# Patient Record
Sex: Male | Born: 1945
Health system: Southern US, Community
[De-identification: ages and names within clinical notes are randomized; demographics above are authoritative.]

## PROBLEM LIST (undated history)

## (undated) ENCOUNTER — Emergency Department (HOSPITAL_COMMUNITY): Discharge status: Absent without leave | Payer: Medicare Other

## (undated) DIAGNOSIS — Z7189 Other specified counseling: Secondary | ICD-10-CM

## (undated) DIAGNOSIS — E440 Moderate protein-calorie malnutrition: Secondary | ICD-10-CM

## (undated) DIAGNOSIS — Z7901 Long term (current) use of anticoagulants: Secondary | ICD-10-CM

## (undated) DIAGNOSIS — I509 Heart failure, unspecified: Secondary | ICD-10-CM

## (undated) DIAGNOSIS — I4891 Unspecified atrial fibrillation: Secondary | ICD-10-CM

## (undated) DIAGNOSIS — D638 Anemia in other chronic diseases classified elsewhere: Secondary | ICD-10-CM

## (undated) DIAGNOSIS — J90 Pleural effusion, not elsewhere classified: Secondary | ICD-10-CM

## (undated) DIAGNOSIS — Z8719 Personal history of other diseases of the digestive system: Secondary | ICD-10-CM

## (undated) DIAGNOSIS — K4090 Unilateral inguinal hernia, without obstruction or gangrene, not specified as recurrent: Secondary | ICD-10-CM

## (undated) DIAGNOSIS — C3490 Malignant neoplasm of unspecified part of unspecified bronchus or lung: Secondary | ICD-10-CM

## (undated) DIAGNOSIS — Z87442 Personal history of urinary calculi: Secondary | ICD-10-CM

## (undated) DIAGNOSIS — R918 Other nonspecific abnormal finding of lung field: Secondary | ICD-10-CM

## (undated) DIAGNOSIS — I4892 Unspecified atrial flutter: Secondary | ICD-10-CM

## (undated) DIAGNOSIS — T884XXA Failed or difficult intubation, initial encounter: Secondary | ICD-10-CM

## (undated) DIAGNOSIS — R0602 Shortness of breath: Secondary | ICD-10-CM

## (undated) DIAGNOSIS — K469 Unspecified abdominal hernia without obstruction or gangrene: Secondary | ICD-10-CM

## (undated) DIAGNOSIS — R600 Localized edema: Secondary | ICD-10-CM

## (undated) DIAGNOSIS — L8990 Pressure ulcer of unspecified site, unspecified stage: Secondary | ICD-10-CM

## (undated) HISTORY — DX: Malignant neoplasm of unspecified part of unspecified bronchus or lung: C34.90

## (undated) HISTORY — DX: Failed or difficult intubation, initial encounter: T88.4XXA

## (undated) HISTORY — DX: Moderate protein-calorie malnutrition: E44.0

## (undated) HISTORY — DX: Heart failure, unspecified: I50.9

## (undated) HISTORY — DX: Pressure ulcer of unspecified site, unspecified stage: L89.90

## (undated) HISTORY — DX: Localized edema: R60.0

## (undated) HISTORY — DX: Unspecified atrial flutter: I48.92

## (undated) HISTORY — DX: Other specified counseling: Z71.89

## (undated) HISTORY — DX: Unspecified atrial fibrillation: I48.91

## (undated) HISTORY — DX: Long term (current) use of anticoagulants: Z79.01

## (undated) HISTORY — DX: Shortness of breath: R06.02

## (undated) HISTORY — DX: Personal history of urinary calculi: Z87.442

## (undated) HISTORY — DX: Unilateral inguinal hernia, without obstruction or gangrene, not specified as recurrent: K40.90

## (undated) HISTORY — DX: Anemia in other chronic diseases classified elsewhere: D63.8

## (undated) HISTORY — DX: Pleural effusion, not elsewhere classified: J90

## (undated) HISTORY — DX: Other nonspecific abnormal finding of lung field: R91.8

## (undated) HISTORY — DX: Unspecified abdominal hernia without obstruction or gangrene: K46.9

---

## 1990-08-15 DIAGNOSIS — Z87442 Personal history of urinary calculi: Secondary | ICD-10-CM

## 1990-08-15 HISTORY — DX: Personal history of urinary calculi: Z87.442

## 1999-01-01 ENCOUNTER — Ambulatory Visit (HOSPITAL_COMMUNITY): Admission: RE | Admit: 1999-01-01 | Discharge: 1999-01-01 | Payer: Self-pay | Admitting: *Deleted

## 2005-03-17 ENCOUNTER — Encounter: Admission: RE | Admit: 2005-03-17 | Discharge: 2005-06-15 | Payer: Self-pay | Admitting: Family Medicine

## 2006-06-26 ENCOUNTER — Emergency Department (HOSPITAL_COMMUNITY): Admission: EM | Admit: 2006-06-26 | Discharge: 2006-06-26 | Payer: Self-pay | Admitting: Emergency Medicine

## 2010-08-15 DIAGNOSIS — K469 Unspecified abdominal hernia without obstruction or gangrene: Secondary | ICD-10-CM

## 2010-08-15 HISTORY — DX: Unspecified abdominal hernia without obstruction or gangrene: K46.9

## 2010-08-15 HISTORY — PX: HERNIA REPAIR: SHX51

## 2011-03-14 ENCOUNTER — Encounter (INDEPENDENT_AMBULATORY_CARE_PROVIDER_SITE_OTHER): Payer: Self-pay | Admitting: General Surgery

## 2011-03-14 ENCOUNTER — Ambulatory Visit (INDEPENDENT_AMBULATORY_CARE_PROVIDER_SITE_OTHER): Payer: BC Managed Care – PPO | Admitting: General Surgery

## 2011-03-14 VITALS — BP 140/86 | HR 72 | Temp 97.9°F | Ht 73.0 in | Wt 166.0 lb

## 2011-03-14 DIAGNOSIS — K4090 Unilateral inguinal hernia, without obstruction or gangrene, not specified as recurrent: Secondary | ICD-10-CM

## 2011-03-14 NOTE — Progress Notes (Signed)
Brian Ramirez is a 65 y.o. male.    Chief Complaint  Patient presents with  . Other    Evaluate possible indirect hernia    HPI HPI This patient presents for evaluation of right groin pain and bulge which he noticed approximately one month ago. He states he is very active and works out about 3 times a week lifting weights and notices right groin pain with leg lifts. He also noticed a bulge at that time when he was in the shower. He denies any change in the bulge since then she states that this pain is improved although he does still have discomfort in the right groin crease. He states that since then he has been afraid to work out and it limits his lifestyle due to discomfort and the worry. He does state that it has fluctuated in size since then. He denies any exacerbating or relieving factors. He also denies any associated symptoms or trip to symptoms. His bowels are normal and denies any nausea or vomiting.  Past Medical History  Diagnosis Date  . Personal history of kidney stones 1992  . Hernia 2012    PSH:  Orthopedic surgeries, and jaw surgery Lithotripsy for kidney stones  No family history on file.  Social History History  Substance Use Topics  . Smoking status: Never Smoker   . Smokeless tobacco: Not on file  . Alcohol Use: 0.6 oz/week    1 Glasses of wine per week    No Known Allergies  Current Outpatient Prescriptions  Medication Sig Dispense Refill  . multivitamin (THERAGRAN) per tablet Take 1 tablet by mouth daily.          Review of Systems Review of Systems  Constitutional: Negative.   HENT: Negative.   Eyes: Negative.   Respiratory: Negative.   Cardiovascular: Negative.   Gastrointestinal: Negative.   Genitourinary: Negative.   Musculoskeletal: Negative.   Skin: Negative.   Neurological: Negative.   Endo/Heme/Allergies: Negative.   Psychiatric/Behavioral: Negative.     Physical Exam Physical Exam  Constitutional: He is oriented to person,  place, and time. He appears well-developed and well-nourished. No distress.  HENT:  Head: Normocephalic and atraumatic.  Mouth/Throat: No oropharyngeal exudate.  Eyes: Conjunctivae and EOM are normal. Pupils are equal, round, and reactive to light. Right eye exhibits no discharge. Left eye exhibits no discharge. No scleral icterus.  Neck: Normal range of motion. Neck supple. No tracheal deviation present.  Cardiovascular: Normal rate, regular rhythm and normal heart sounds.   Respiratory: Effort normal and breath sounds normal. No stridor. No respiratory distress. He has no wheezes.  GI: Soft. Bowel sounds are normal. He exhibits no distension and no mass. There is no tenderness. There is no rebound and no guarding.  Genitourinary: Penis normal. No penile tenderness.       Reducible RIH, nontender No LIH  Musculoskeletal: He exhibits no edema and no tenderness.       Right leg limited motion mainly at knee joint   Neurological: He is alert and oriented to person, place, and time. He has normal reflexes.  Skin: Skin is warm. No rash noted. He is not diaphoretic. No erythema. No pallor.  Psychiatric: He has a normal mood and affect. His behavior is normal. Judgment and thought content normal.     Blood pressure 140/86, pulse 72, temperature 97.9 F (36.6 C), temperature source Temporal, height 6\' 1"  (1.854 m), weight 166 lb (75.297 kg).  Assessment/Plan Reducible right inguinal hernia He does have  a moderate-sized right inguinal hernia on exam which is reducible today. I discussed with him the options of watchful waiting versus open repair versus laparoscopic repair. We discussed the risks and benefits of each and he is interested in laparoscopic right inguinal hernia repair with mesh. We discussed the risks of infection, bleeding, pain, scarring, recurrence, injury to the vas deferens, injury to testicle, and chronic pain and need for open surgery and he expressed understanding and desires to  proceed with laparoscopic hernia repair. I will request that he have an anesthesia consultation prior to his procedure given the fact that he has had jaw surgery previously and had a difficult airway per patient history. If for some reason he cannot undergo general anesthesia we will change her plan consider open right inguinal hernia repair with local and moderate sedation.   Lodema Pilot DAVID 03/14/2011, 2:09 PM

## 2011-03-21 ENCOUNTER — Other Ambulatory Visit (INDEPENDENT_AMBULATORY_CARE_PROVIDER_SITE_OTHER): Payer: Self-pay | Admitting: General Surgery

## 2011-03-21 ENCOUNTER — Encounter (HOSPITAL_COMMUNITY): Payer: BC Managed Care – PPO

## 2011-03-21 LAB — CBC
Hemoglobin: 14.6 g/dL (ref 13.0–17.0)
RBC: 4.83 MIL/uL (ref 4.22–5.81)
WBC: 6.6 10*3/uL (ref 4.0–10.5)

## 2011-03-31 ENCOUNTER — Ambulatory Visit (HOSPITAL_COMMUNITY)
Admission: RE | Admit: 2011-03-31 | Discharge: 2011-03-31 | Disposition: A | Discharge status: Home / Self Care | Payer: BC Managed Care – PPO | Source: Ambulatory Visit | Attending: General Surgery | Admitting: General Surgery

## 2011-03-31 DIAGNOSIS — K4090 Unilateral inguinal hernia, without obstruction or gangrene, not specified as recurrent: Secondary | ICD-10-CM | POA: Insufficient documentation

## 2011-03-31 DIAGNOSIS — Z01812 Encounter for preprocedural laboratory examination: Secondary | ICD-10-CM | POA: Insufficient documentation

## 2011-03-31 DIAGNOSIS — Z87442 Personal history of urinary calculi: Secondary | ICD-10-CM | POA: Insufficient documentation

## 2011-03-31 DIAGNOSIS — Z7982 Long term (current) use of aspirin: Secondary | ICD-10-CM | POA: Insufficient documentation

## 2011-04-05 NOTE — Op Note (Signed)
NAME:  Brian Ramirez, DURKEE NO.:  [id number]  MEDICAL RECORD NO.:  [id number]  LOCATION:  [facility]                         FACILITY:  [facility]  PHYSICIAN:  Lodema Pilot, MD       DATE OF BIRTH:  1945/11/28  DATE OF PROCEDURE:  03/31/2011 DATE OF DISCHARGE:  03/21/2011                              OPERATIVE REPORT   PROCEDURE:  Open right inguinal hernia repair with mesh.  PREOPERATIVE DIAGNOSIS:  Right inguinal hernia.  POSTOPERATIVE DIAGNOSIS:  Right inguinal hernia.  SURGEON:  Lodema Pilot, MD  ASSISTANT:  None.  ANESTHESIA:  Spinal anesthesia with 30 mL of 1% lidocaine with epinephrine and 0.25% Marcaine injected at 50:50 mixture.  FLUIDS:  1100 mL of crystalloid.  ESTIMATED BLOOD LOSS:  Minimal.  DRAINS:  None.  SPECIMENS:  None.  COMPLICATIONS:  None apparent.  FINDINGS:  Indirect and direct hernia identified as well as cord lipoma. A 3 inch x 6 inch Ultrapro mesh was placed in the wound and sutured with permanent suture.  Ilioinguinal nerve was identified and preserved.  INDICATIONS FOR PROCEDURE:  Mr. Leifheit is a 65 year old male with a symptomatic right inguinal hernia, desires repair.  OPERATIVE DETAILS:  Mr. Buchler was seen and evaluated in the preoperative area, and risks and benefits of procedure were discussed in lay terms.  Informed consent was obtained.  Prophylactic antibiotics were given and he was taken to the surgical service marked prior to anesthetic administration.  Originally, we had planned for laparoscopic repair; however, due to his difficult airway and consultation with the patient and with Anesthesia, it was felt that changing to an open repair would be without the general anesthesia would be safer for him and he agreed.  His abdomen and groin were prepped and draped in a standard surgical fashion and an oblique incision made in the skin and dissection carried down through subcutaneous tissue using Bovie electrocautery. The  external oblique fascia was divided sharply, incised along the length of the incision opening the external ring.  The ilioinguinal nerve was identified in its usual position anterior to the cord.  This was preserved during the dissection.  The cord was dissected out circumferentially and a Penrose drain was passed for gentle retraction. The spermatic cord was skeletonized and he was noted to have a moderate sized cord lipoma and entire ligation of the cord lipoma was performed with a 2-0 Vicryl stick tie.  He also was noted to have a small direct hernia and the inguinal floor was imbricated with 2-0 Vicryl suture and he was also noted to have an indirect inguinal hernia.  The hernia sac was separated from the spermatic cord and sac was opened and there was no bowel contents identified.  High ligation of sac was performed with 2- 0 Vicryl stick tie and the wound was inspected for hemostasis.  This was noted to be adequate.  A 3 inch x 6 inch Ultrapro mesh was tailored to fit the inguinal canal and sutured into place at the pubic tubercle with 2-0 Prolene sutures.  This was run along the shelving edge of the ligament.  Interrupted 2-0 Prolene sutures were used to secure the mesh medially and cephalad and  laterally.  A slit was placed in the mesh laterally and tails of the mesh were passed around the spermatic cord. The ilioinguinal nerve was wrapped in the mesh with replacing it in its usual position with the cord, the tails of the mesh were tacked laterally with interrupted 2-0 Prolene sutures and the mesh appeared to lie flat covering the indirect and direct defects.  The wound was inspected for hemostasis which was noted to be adequate.  The external oblique fascia was approximated with a running 2-0 Vicryl suture, recreating a new external ring and Scarpa fascia was approximated with running 3-0 Vicryl suture.  A 4-0 Monocryl suture was used to approximate the skin edges and the skin  edges were well approximated and Dermabond was applied.  All sponge, needle, and instrument counts were correct at the end of the case.  The patient tolerated the procedure well.  There were no apparent complications.          ______________________________ Lodema Pilot, MD     BL/MEDQ  D:  03/31/2011  T:  04/01/2011  Job:  [id number]  Electronically Signed by Lodema Pilot DO on 04/05/2011 09:44:15 AM

## 2011-04-19 ENCOUNTER — Encounter (INDEPENDENT_AMBULATORY_CARE_PROVIDER_SITE_OTHER): Payer: Self-pay | Admitting: General Surgery

## 2011-04-19 ENCOUNTER — Ambulatory Visit (INDEPENDENT_AMBULATORY_CARE_PROVIDER_SITE_OTHER): Payer: BC Managed Care – PPO | Admitting: General Surgery

## 2011-04-19 VITALS — BP 138/84 | HR 70 | Temp 97.3°F | Resp 16

## 2011-04-19 DIAGNOSIS — Z4889 Encounter for other specified surgical aftercare: Secondary | ICD-10-CM

## 2011-04-19 DIAGNOSIS — Z5189 Encounter for other specified aftercare: Secondary | ICD-10-CM

## 2011-04-19 NOTE — Progress Notes (Signed)
Subjective:     Patient ID: Brian Ramirez, male   DOB: 1945-10-25, 65 y.o.   MRN: [id number]  HPI This patient is almost 3 weeks status post open right inguinal hernia repair with mesh. He is doing well without complaints. He actually has returned to his leg lifts and his regular leg workouts. He is not taking pain medication and his bowels are functioning regularly. He is asking when he can go back to full activity.He did have some urinary retention after his spinal anesthesia which required Foley placement and he removed this himself on postop day #1. He has not had any problems voiding since then.  Review of Systems     Objective:   Physical Exam No acute distress and nontoxic-appearing  His incision is healing well without sign of infection. He has a healing ridge but no evidence of recurrent hernia. No bulge with Valsalva    Assessment:     S/p open RIH-doing well    Plan:     He seems to be doing well and appropriate for this stage in his recovery. The wound looks good and there is no evidence of recurrent hernia. I recommended that he continue with light duty for another week to 1-1/2 weeks and at that time he can return to full activity as tolerated.

## 2012-07-09 DIAGNOSIS — Z23 Encounter for immunization: Secondary | ICD-10-CM | POA: Diagnosis not present

## 2012-07-09 DIAGNOSIS — N529 Male erectile dysfunction, unspecified: Secondary | ICD-10-CM | POA: Diagnosis not present

## 2012-07-09 DIAGNOSIS — Z125 Encounter for screening for malignant neoplasm of prostate: Secondary | ICD-10-CM | POA: Diagnosis not present

## 2012-07-09 DIAGNOSIS — Z0000 Encounter for general adult medical examination without abnormal findings: Secondary | ICD-10-CM | POA: Diagnosis not present

## 2012-07-09 DIAGNOSIS — Z136 Encounter for screening for cardiovascular disorders: Secondary | ICD-10-CM | POA: Diagnosis not present

## 2012-07-09 DIAGNOSIS — Z79899 Other long term (current) drug therapy: Secondary | ICD-10-CM | POA: Diagnosis not present

## 2012-07-11 ENCOUNTER — Other Ambulatory Visit: Payer: Self-pay | Admitting: Family Medicine

## 2012-07-11 DIAGNOSIS — Z139 Encounter for screening, unspecified: Secondary | ICD-10-CM

## 2012-07-16 ENCOUNTER — Ambulatory Visit
Admission: RE | Admit: 2012-07-16 | Discharge: 2012-07-16 | Disposition: A | Discharge status: Home / Self Care | Payer: Medicare Other | Source: Ambulatory Visit | Attending: Family Medicine | Admitting: Family Medicine

## 2012-07-16 DIAGNOSIS — Z139 Encounter for screening, unspecified: Secondary | ICD-10-CM

## 2012-07-16 DIAGNOSIS — Z136 Encounter for screening for cardiovascular disorders: Secondary | ICD-10-CM | POA: Diagnosis not present

## 2012-08-24 DIAGNOSIS — J069 Acute upper respiratory infection, unspecified: Secondary | ICD-10-CM | POA: Diagnosis not present

## 2013-03-05 DIAGNOSIS — R040 Epistaxis: Secondary | ICD-10-CM | POA: Diagnosis not present

## 2013-03-05 DIAGNOSIS — H6980 Other specified disorders of Eustachian tube, unspecified ear: Secondary | ICD-10-CM | POA: Diagnosis not present

## 2013-03-25 DIAGNOSIS — M21549 Acquired clubfoot, unspecified foot: Secondary | ICD-10-CM | POA: Diagnosis not present

## 2013-07-23 DIAGNOSIS — Z0000 Encounter for general adult medical examination without abnormal findings: Secondary | ICD-10-CM | POA: Diagnosis not present

## 2013-07-23 DIAGNOSIS — Z5181 Encounter for therapeutic drug level monitoring: Secondary | ICD-10-CM | POA: Diagnosis not present

## 2013-07-23 DIAGNOSIS — Z125 Encounter for screening for malignant neoplasm of prostate: Secondary | ICD-10-CM | POA: Diagnosis not present

## 2013-07-23 DIAGNOSIS — Z136 Encounter for screening for cardiovascular disorders: Secondary | ICD-10-CM | POA: Diagnosis not present

## 2013-07-23 DIAGNOSIS — N529 Male erectile dysfunction, unspecified: Secondary | ICD-10-CM | POA: Diagnosis not present

## 2014-01-24 DIAGNOSIS — R7301 Impaired fasting glucose: Secondary | ICD-10-CM | POA: Diagnosis not present

## 2014-04-08 DIAGNOSIS — H2510 Age-related nuclear cataract, unspecified eye: Secondary | ICD-10-CM | POA: Diagnosis not present

## 2014-04-08 DIAGNOSIS — H1045 Other chronic allergic conjunctivitis: Secondary | ICD-10-CM | POA: Diagnosis not present

## 2015-03-03 DIAGNOSIS — Z136 Encounter for screening for cardiovascular disorders: Secondary | ICD-10-CM | POA: Diagnosis not present

## 2015-03-03 DIAGNOSIS — R7301 Impaired fasting glucose: Secondary | ICD-10-CM | POA: Diagnosis not present

## 2015-03-03 DIAGNOSIS — Z0001 Encounter for general adult medical examination with abnormal findings: Secondary | ICD-10-CM | POA: Diagnosis not present

## 2015-03-03 DIAGNOSIS — N5201 Erectile dysfunction due to arterial insufficiency: Secondary | ICD-10-CM | POA: Diagnosis not present

## 2015-03-03 DIAGNOSIS — Z125 Encounter for screening for malignant neoplasm of prostate: Secondary | ICD-10-CM | POA: Diagnosis not present

## 2015-03-03 DIAGNOSIS — Z131 Encounter for screening for diabetes mellitus: Secondary | ICD-10-CM | POA: Diagnosis not present

## 2015-03-03 DIAGNOSIS — Z23 Encounter for immunization: Secondary | ICD-10-CM | POA: Diagnosis not present

## 2015-03-23 DIAGNOSIS — Z0001 Encounter for general adult medical examination with abnormal findings: Secondary | ICD-10-CM | POA: Diagnosis not present

## 2015-03-23 DIAGNOSIS — Z1211 Encounter for screening for malignant neoplasm of colon: Secondary | ICD-10-CM | POA: Diagnosis not present

## 2015-04-09 DIAGNOSIS — H2513 Age-related nuclear cataract, bilateral: Secondary | ICD-10-CM | POA: Diagnosis not present

## 2016-04-28 DIAGNOSIS — L700 Acne vulgaris: Secondary | ICD-10-CM | POA: Diagnosis not present

## 2016-04-28 DIAGNOSIS — L219 Seborrheic dermatitis, unspecified: Secondary | ICD-10-CM | POA: Diagnosis not present

## 2016-08-29 DIAGNOSIS — R7309 Other abnormal glucose: Secondary | ICD-10-CM | POA: Diagnosis not present

## 2016-08-29 DIAGNOSIS — K4090 Unilateral inguinal hernia, without obstruction or gangrene, not specified as recurrent: Secondary | ICD-10-CM | POA: Diagnosis not present

## 2016-08-29 DIAGNOSIS — Z79899 Other long term (current) drug therapy: Secondary | ICD-10-CM | POA: Diagnosis not present

## 2016-08-29 DIAGNOSIS — E7800 Pure hypercholesterolemia, unspecified: Secondary | ICD-10-CM | POA: Diagnosis not present

## 2016-08-29 DIAGNOSIS — Z125 Encounter for screening for malignant neoplasm of prostate: Secondary | ICD-10-CM | POA: Diagnosis not present

## 2016-08-29 DIAGNOSIS — N5201 Erectile dysfunction due to arterial insufficiency: Secondary | ICD-10-CM | POA: Diagnosis not present

## 2016-08-29 DIAGNOSIS — R7301 Impaired fasting glucose: Secondary | ICD-10-CM | POA: Diagnosis not present

## 2016-08-29 DIAGNOSIS — Z0001 Encounter for general adult medical examination with abnormal findings: Secondary | ICD-10-CM | POA: Diagnosis not present

## 2016-09-07 DIAGNOSIS — Z1211 Encounter for screening for malignant neoplasm of colon: Secondary | ICD-10-CM | POA: Diagnosis not present

## 2016-09-26 ENCOUNTER — Ambulatory Visit: Payer: Self-pay | Admitting: Surgery

## 2016-09-26 ENCOUNTER — Encounter: Payer: Self-pay | Admitting: Surgery

## 2016-09-26 DIAGNOSIS — T884XXD Failed or difficult intubation, subsequent encounter: Secondary | ICD-10-CM | POA: Diagnosis not present

## 2016-09-26 DIAGNOSIS — Z01818 Encounter for other preprocedural examination: Secondary | ICD-10-CM | POA: Diagnosis not present

## 2016-09-26 DIAGNOSIS — T884XXA Failed or difficult intubation, initial encounter: Secondary | ICD-10-CM | POA: Insufficient documentation

## 2016-09-26 DIAGNOSIS — K4090 Unilateral inguinal hernia, without obstruction or gangrene, not specified as recurrent: Secondary | ICD-10-CM

## 2016-09-26 HISTORY — DX: Unilateral inguinal hernia, without obstruction or gangrene, not specified as recurrent: K40.90

## 2016-09-26 HISTORY — DX: Failed or difficult intubation, initial encounter: T88.4XXA

## 2016-09-26 NOTE — H&P (Signed)
Brian Ramirez 09/26/2016 1:33 PM Location: [facility] Patient #: [id number] DOB: Aug 24, 1945 Single / Language: Brian Ramirez / Race: White Male  History of Present Illness Brian Hector MD; 09/26/2016 2:29 PM) The patient is a 71 year old male who presents with an inguinal hernia. Note for "Inguinal hernia": Patient sent for surgical consultation at the request of his primary care physician, Dr. Lanette Ramirez. Concern for left inguinal hernia.  Pleasant active male. Had open right inguinal hernia repair with mesh in 2012 by former partner Dr. Lilyan Ramirez whom relocated back into the [employer]. He is noted some intermittent bulging on the left side suspicious for a left inguinal hernia. Nothing obvious felt on annual wellness visit but history suspicious. Surgical consultation requested.  Patient notes he gets occasional bulging and discomfort and he is exercising like doing sit ups. He had an open right inguinal hernia repair done under spinal anesthesia. He request recalls being told by anesthesia that his intubation risks were high due to a tight oropharynx and difficult airway. Therefore did under a spinal block. He did have urinary retention and wonders if he can avoid that. He prefer [employer] He has no problems with urination or defecation. Section active. He does say his girlfriend thought that sex was different after the inguinal hernia repair. Less volume of ejaculate? Patient did not have any wound infections. No fevers or chills. Moves his bowels about 4 times a day. Normal colonoscopy in the past. Does not smoke. No cardiac or pulmonary issues. Minimal mobility due to a fixed right knee. On disability somewhat. Does try to exercise with sit ups and a gym routine.   Past Surgical History (Brian Ramirez, CMA; 09/26/2016 1:33 PM) Open Inguinal Hernia Surgery Right. Oral Surgery  Diagnostic Studies History (Brian Ramirez, [place]; 09/26/2016 1:33  PM) Colonoscopy >10 years ago  Allergies (Brian Ramirez, CMA; 09/26/2016 1:33 PM) No Known Drug Allergies 09/26/2016  Medication History (Brian Ramirez, [place]; 09/26/2016 1:33 PM) No Current Medications Medications Reconciled  Social History (Brian Ramirez, CMA; 09/26/2016 1:33 PM) Alcohol use Moderate alcohol use. Caffeine use Coffee. No drug use Tobacco use Former smoker.  Family History (Brian Ramirez, [place]; 09/26/2016 1:33 PM) Colon Cancer Father. Heart Disease Father.  Other Problems (Brian Ramirez, CMA; 09/26/2016 1:33 PM) Inguinal Hernia     Review of Systems (Brian Ramirez CMA; 09/26/2016 1:33 PM) General Not Present- Appetite Loss, Chills, Fatigue, Fever, Night Sweats, Weight Gain and Weight Loss. Skin Not Present- Change in Wart/Mole, Dryness, Hives, Jaundice, New Lesions, Non-Healing Wounds, Rash and Ulcer. HEENT Not Present- Earache, Hearing Loss, Hoarseness, Nose Bleed, Oral Ulcers, Ringing in the Ears, Seasonal Allergies, Sinus Pain, Sore Throat, Visual Disturbances, Wears glasses/contact lenses and Yellow Eyes. Respiratory Not Present- Bloody sputum, Chronic Cough, Difficulty Breathing, Snoring and Wheezing. Breast Not Present- Breast Mass, Breast Pain, Nipple Discharge and Skin Changes. Cardiovascular Not Present- Chest Pain, Difficulty Breathing Lying Down, Leg Cramps, Palpitations, Rapid Heart Rate, Shortness of Breath and Swelling of Extremities. Gastrointestinal Not Present- Abdominal Pain, Bloating, Bloody Stool, Change in Bowel Habits, Chronic diarrhea, Constipation, Difficulty Swallowing, Excessive gas, Gets full quickly at meals, Hemorrhoids, Indigestion, Nausea, Rectal Pain and Vomiting. Male Genitourinary Not Present- Blood in Urine, Change in Urinary Stream, Frequency, Impotence, Nocturia, Painful Urination, Urgency and Urine Leakage.  Vitals (Brian Ramirez CMA; 09/26/2016 1:34 PM) 09/26/2016 1:33 PM Weight: 173.5 lb Height: 73in Body Surface Area: 2.03  m Body Mass Index: 22.89 kg/m  Temp.: 98.27F(Oral)  Pulse: 70 (Regular)  BP: 140/78 (Sitting, Left Arm, Standard)      Physical Exam Brian Hector MD; 09/26/2016 2:18 PM)  Head and Neck Note: Neck somewhat stiff with limited motion.  Eye Note: Wears glasses. Vision corrected  ENMT Note: Can open-mouth about 2-1/2 cm wide. Motion. No hoarseness. No stridor.  Male Genitourinary Note: Left groin bulging with Valsalva consistent with inguinal hernia. He feels more lateral. Perhaps an indirect.  No right inguinal hernia. Normal external genitalia. Epididymi, testes, and spermatic cords normal without any masses.  Musculoskeletal Note: Fixed right knee with inward valgus. Walks with a limp    Assessment & Plan Brian Hector MD; 09/26/2016 2:16 PM)  LEFT INGUINAL HERNIA (K40.90) Impression: Thank you benefit from hernia repair. Standard of care is with mesh.  Usually I do these laparoscopic underlay TEP approach. However with his difficult airway, more likely may have to do a more open with regional anesthesia & MAC. We'll have anesthesia meet with the patient preoperatively and see what is the safest plan. Would prefer to have this done at a hospital setting in case of airway issues that requires obeservation/admission. Patient prefers [place] EXAMINATION FOR GENERAL SURGICAL PROCEDURE (Z01.818)  Current Plans You are being scheduled for surgery- Our schedulers will call you.  You should hear from our office's scheduling department within 5 working days about the location, date, and time of surgery. We try to make accommodations for patient's preferences in scheduling surgery, but sometimes the OR schedule or the surgeon's schedule prevents [place] from making those accommodations.  If you have not heard from our office [phone]) in 5 working days, call the office and ask for your surgeon's  nurse.  If you have other questions about your diagnosis, plan, or surgery, call the office and ask for your surgeon's nurse.  Written instructions provided The anatomy & physiology of the abdominal wall and pelvic floor was discussed. The pathophysiology of hernias in the inguinal and pelvic region was discussed. Natural history risks such as progressive enlargement, pain, incarceration, and strangulation was discussed. Contributors to complications such as smoking, obesity, diabetes, prior surgery, etc were discussed.  I feel the risks of no intervention will lead to serious problems that outweigh the operative risks; therefore, I recommended surgery to reduce and repair the hernia. I explained laparoscopic techniques with possible need for an open approach. I noted usual use of mesh to patch and/or buttress hernia repair  Risks such as bleeding, infection, abscess, need for further treatment, heart attack, death, and other risks were discussed. I noted a good likelihood this will help address the problem. Goals of post-operative recovery were discussed as well. Possibility that this will not correct all symptoms was explained. I stressed the importance of low-impact activity, aggressive pain control, avoiding constipation, & not pushing through pain to minimize risk of post-operative chronic pain or injury. Possibility of reherniation was discussed. We will work to minimize complications.  An educational handout further explaining the pathology & treatment options was given as well. Questions were answered. The patient expresses understanding & wishes to proceed with surgery.  Pt Education - [facility] Pain Control (Locke Barrell) Pt Education - [facility] Hernia Post-Op HCI (Mylasia Vorhees): discussed with patient and provided information. Pt Education - Pamphlet Given - Hernia Surgery: discussed with patient and provided information. DIFFICULT AIRWAY FOR INTUBATION, SUBSEQUENT ENCOUNTER  (T88.4XXD) Impression: Patient with difficult airway for intubation. Dr. Landry Dyke with anesthesia apparently did not feel comfortable attempting  endotracheal intubation in 2012. Last time he had to have spinal anesthesia and open inguinal hernia repair.  We'll have anesthesia meet with him at the preoperative visit and have a plan for his airway. Should they feel it is safe to intubate him, would do a laparoscopic repair as I think that is a less painful more durable repair. However, if there is doubt, then plan regional block with MAC & open repair w mesh. Patient notes he had urinary retention and difficulty with the spinal anesthesia. Perhaps a TAPP block could be done.  Brian Ramirez, M.D., F.A.C.S. Gastrointestinal and Minimally Invasive Surgery [facility] [address] [place], [place] [id number] [phone] Main / Paging

## 2016-10-25 ENCOUNTER — Encounter (HOSPITAL_COMMUNITY): Payer: Self-pay | Admitting: *Deleted

## 2016-11-16 NOTE — Patient Instructions (Signed)
KOTA CIANCIO  11/16/2016   Your procedure is scheduled on: 11/22/2016    Report to [facility] Main  Entrance take [place]  elevators to 3rd floor to  [place] at   [place] AM.  Call this number if you have problems the morning of surgery [phone]   Remember: ONLY 1 PERSON MAY GO WITH YOU TO SHORT STAY TO GET  READY MORNING OF [place].  Do not eat food or drink liquids :After Midnight.     Take these medicines the morning of surgery with A SIP OF WATER: [place] nasal spray if needed                                 You may not have any metal on your body including hair pins and              piercings  Do not wear jewelry, , lotions, powders or perfumes, deodorant                       Men may shave face and neck.   Do not bring valuables to the hospital. [place].  Contacts, dentures or bridgework may not be worn into surgery.      Patients discharged the day of surgery will not be allowed to drive home.  Name and phone number of your driver:  Special Instructions: N/A              Please read over the following fact sheets you were given: _____________________________________________________________________             [facility] - Preparing for Surgery Before surgery, you can play an important role.  Because skin is not sterile, your skin needs to be as free of germs as possible.  You can reduce the number of germs on your skin by washing with CHG (chlorahexidine gluconate) soap before surgery.  CHG is an antiseptic cleaner which kills germs and bonds with the skin to continue killing germs even after washing. Please DO NOT use if you have an allergy to CHG or antibacterial soaps.  If your skin becomes reddened/irritated stop using the CHG and inform your nurse when you arrive at Short Stay. Do not shave (including legs and underarms) for at least 48 hours prior to the first CHG shower.   You may shave your face/neck. Please follow these instructions carefully:  1.  Shower with CHG Soap the night before surgery and the  morning of Surgery.  2.  If you choose to wash your hair, wash your hair first as usual with your  normal  shampoo.  3.  After you shampoo, rinse your hair and body thoroughly to remove the  shampoo.                           4.  Use CHG as you would any other liquid soap.  You can apply chg directly  to the skin and wash                       Gently with a scrungie or clean washcloth.  5.  Apply the  CHG Soap to your body ONLY FROM THE NECK DOWN.   Do not use on face/ open                           Wound or open sores. Avoid contact with eyes, ears mouth and genitals (private parts).                       Wash face,  Genitals (private parts) with your normal soap.             6.  Wash thoroughly, paying special attention to the area where your surgery  will be performed.  7.  Thoroughly rinse your body with warm water from the neck down.  8.  DO NOT shower/wash with your normal soap after using and rinsing off  the CHG Soap.                9.  Pat yourself dry with a clean towel.            10.  Wear clean pajamas.            11.  Place clean sheets on your bed the night of your first shower and do not  sleep with pets. Day of Surgery : Do not apply any lotions/deodorants the morning of surgery.  Please wear clean clothes to the hospital/surgery center.  FAILURE TO FOLLOW THESE INSTRUCTIONS MAY RESULT IN THE CANCELLATION OF YOUR SURGERY PATIENT SIGNATURE_________________________________  NURSE SIGNATURE__________________________________  ________________________________________________________________________

## 2016-11-17 ENCOUNTER — Encounter (HOSPITAL_COMMUNITY): Payer: Self-pay

## 2016-11-17 ENCOUNTER — Encounter (HOSPITAL_COMMUNITY)
Admission: RE | Admit: 2016-11-17 | Discharge: 2016-11-17 | Disposition: A | Discharge status: Home / Self Care | Payer: Medicare Other | Source: Ambulatory Visit | Attending: Surgery | Admitting: Surgery

## 2016-11-17 DIAGNOSIS — Z01812 Encounter for preprocedural laboratory examination: Secondary | ICD-10-CM | POA: Insufficient documentation

## 2016-11-17 DIAGNOSIS — K4090 Unilateral inguinal hernia, without obstruction or gangrene, not specified as recurrent: Secondary | ICD-10-CM | POA: Insufficient documentation

## 2016-11-17 HISTORY — DX: Personal history of urinary calculi: Z87.442

## 2016-11-17 HISTORY — DX: Personal history of other diseases of the digestive system: Z87.19

## 2016-11-17 LAB — CBC
HCT: 41.7 % (ref 39.0–52.0)
HEMOGLOBIN: 14.6 g/dL (ref 13.0–17.0)
MCH: 31.3 pg (ref 26.0–34.0)
MCHC: 35 g/dL (ref 30.0–36.0)
MCV: 89.5 fL (ref 78.0–100.0)
Platelets: 194 10*3/uL (ref 150–400)
RBC: 4.66 MIL/uL (ref 4.22–5.81)
RDW: 12.6 % (ref 11.5–15.5)
WBC: 5.4 10*3/uL (ref 4.0–10.5)

## 2016-11-17 NOTE — Progress Notes (Signed)
Pt requesting to speak with anesthesiology relating to his anesthesia preference and concerns. Dr. Annye Asa spoke with pt.
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
## 2016-11-21 NOTE — Anesthesia Preprocedure Evaluation (Addendum)
Anesthesia Evaluation  Patient identified by MRN, date of birth, ID band Patient awake    Reviewed: Allergy & Precautions, H&P , Patient's Chart, lab work & pertinent test results, reviewed documented beta blocker date and time   Airway Mallampati: II  TM Distance: >3 FB Neck ROM: full    Dental no notable dental hx.    Pulmonary    Pulmonary exam normal breath sounds clear to auscultation       Cardiovascular Exercise Tolerance: Good  Rhythm:regular Rate:Normal     Neuro/Psych    GI/Hepatic   Endo/Other    Renal/GU      Musculoskeletal   Abdominal   Peds  Hematology   Anesthesia Other Findings   Reproductive/Obstetrics                             Anesthesia Physical Anesthesia Plan  ASA: II  Anesthesia Plan: Spinal, Combined Spinal and Epidural and Epidural   Post-op Pain Management:    Induction:   Airway Management Planned:   Additional Equipment:   Intra-op Plan:   Post-operative Plan:   Informed Consent: I have reviewed the patients History and Physical, chart, labs and discussed the procedure including the risks, benefits and alternatives for the proposed anesthesia with the patient or authorized representative who has indicated his/her understanding and acceptance.   Dental Advisory Given  Plan Discussed with: CRNA and Surgeon  Anesthesia Plan Comments: (  )        Anesthesia Quick Evaluation

## 2016-11-22 ENCOUNTER — Ambulatory Visit (HOSPITAL_COMMUNITY): Payer: Medicare Other | Admitting: Anesthesiology

## 2016-11-22 ENCOUNTER — Ambulatory Visit (HOSPITAL_COMMUNITY)
Admission: RE | Admit: 2016-11-22 | Discharge: 2016-11-22 | Disposition: A | Discharge status: Home / Self Care | Payer: Medicare Other | Source: Ambulatory Visit | Attending: Surgery | Admitting: Surgery

## 2016-11-22 ENCOUNTER — Encounter (HOSPITAL_COMMUNITY)
Admission: RE | Disposition: A | Discharge status: Home / Self Care | Payer: Self-pay | Source: Ambulatory Visit | Attending: Surgery

## 2016-11-22 ENCOUNTER — Encounter (HOSPITAL_COMMUNITY): Payer: Self-pay | Admitting: *Deleted

## 2016-11-22 DIAGNOSIS — K4090 Unilateral inguinal hernia, without obstruction or gangrene, not specified as recurrent: Secondary | ICD-10-CM

## 2016-11-22 DIAGNOSIS — Z87891 Personal history of nicotine dependence: Secondary | ICD-10-CM | POA: Insufficient documentation

## 2016-11-22 HISTORY — PX: INGUINAL HERNIA REPAIR: SHX194

## 2016-11-22 SURGERY — REPAIR, HERNIA, INGUINAL, ADULT
Anesthesia: General | Site: Abdomen | Laterality: Left

## 2016-11-22 MED ORDER — ACETAMINOPHEN 500 MG PO TABS
1000.0000 mg | ORAL_TABLET | ORAL | Status: AC
  Administered 2016-11-22: 1000 mg via ORAL
  Filled 2016-11-22: qty 2

## 2016-11-22 MED ORDER — FENTANYL CITRATE (PF) 100 MCG/2ML IJ SOLN
INTRAMUSCULAR | Status: DC
Start: 2016-11-22 — End: 2016-11-22
  Filled 2016-11-22: qty 2

## 2016-11-22 MED ORDER — ROCURONIUM BROMIDE 50 MG/5ML IV SOSY
PREFILLED_SYRINGE | INTRAVENOUS | Status: AC
  Filled 2016-11-22: qty 5

## 2016-11-22 MED ORDER — FENTANYL CITRATE (PF) 100 MCG/2ML IJ SOLN
INTRAMUSCULAR | Status: DC | PRN
  Administered 2016-11-22 (×3): 50 ug via INTRAVENOUS

## 2016-11-22 MED ORDER — LACTATED RINGERS IV SOLN
INTRAVENOUS | Status: DC | PRN
  Administered 2016-11-22 (×2): via INTRAVENOUS

## 2016-11-22 MED ORDER — SUGAMMADEX SODIUM 200 MG/2ML IV SOLN
INTRAVENOUS | Status: AC
  Filled 2016-11-22: qty 2

## 2016-11-22 MED ORDER — GLYCOPYRROLATE 0.2 MG/ML IV SOSY
PREFILLED_SYRINGE | INTRAVENOUS | Status: AC
  Filled 2016-11-22: qty 10

## 2016-11-22 MED ORDER — BUPIVACAINE HCL (PF) 0.25 % IJ SOLN
INTRAMUSCULAR | Status: AC
  Filled 2016-11-22: qty 60

## 2016-11-22 MED ORDER — FENTANYL CITRATE (PF) 100 MCG/2ML IJ SOLN: 25.0000 ug | INTRAMUSCULAR | Status: DC | PRN

## 2016-11-22 MED ORDER — GABAPENTIN 300 MG PO CAPS
300.0000 mg | ORAL_CAPSULE | ORAL | Status: AC
Start: 2016-11-22 — End: 2016-11-22
  Administered 2016-11-22: 300 mg via ORAL
  Filled 2016-11-22: qty 1

## 2016-11-22 MED ORDER — ACETAMINOPHEN 325 MG PO TABS: 650.0000 mg | ORAL_TABLET | ORAL | Status: DC | PRN

## 2016-11-22 MED ORDER — FENTANYL CITRATE (PF) 100 MCG/2ML IJ SOLN
INTRAMUSCULAR | Status: AC
  Filled 2016-11-22: qty 2

## 2016-11-22 MED ORDER — TRAMADOL HCL 50 MG PO TABS
50.0000 mg | ORAL_TABLET | Freq: Four times a day (QID) | ORAL | Status: DC | PRN
  Administered 2016-11-22: 100 mg via ORAL
  Filled 2016-11-22 (×2): qty 2

## 2016-11-22 MED ORDER — SODIUM CHLORIDE 0.9 % IV SOLN: 250.0000 mL | INTRAVENOUS | Status: DC | PRN

## 2016-11-22 MED ORDER — ONDANSETRON HCL 4 MG/2ML IJ SOLN
INTRAMUSCULAR | Status: AC
  Filled 2016-11-22: qty 2

## 2016-11-22 MED ORDER — MIDAZOLAM HCL 5 MG/5ML IJ SOLN
INTRAMUSCULAR | Status: DC | PRN
  Administered 2016-11-22: 2 mg via INTRAVENOUS

## 2016-11-22 MED ORDER — CELECOXIB 200 MG PO CAPS
400.0000 mg | ORAL_CAPSULE | ORAL | Status: AC
  Administered 2016-11-22: 400 mg via ORAL
  Filled 2016-11-22: qty 2

## 2016-11-22 MED ORDER — LACTATED RINGERS IR SOLN
Status: DC | PRN
  Administered 2016-11-22: 1000 mL

## 2016-11-22 MED ORDER — ROCURONIUM BROMIDE 100 MG/10ML IV SOLN
INTRAVENOUS | Status: DC | PRN
  Administered 2016-11-22: 10 mg via INTRAVENOUS
  Administered 2016-11-22: 50 mg via INTRAVENOUS
  Administered 2016-11-22: 20 mg via INTRAVENOUS

## 2016-11-22 MED ORDER — SUGAMMADEX SODIUM 200 MG/2ML IV SOLN
INTRAVENOUS | Status: DC | PRN
  Administered 2016-11-22: 200 mg via INTRAVENOUS

## 2016-11-22 MED ORDER — BUPIVACAINE HCL (PF) 0.25 % IJ SOLN
INTRAMUSCULAR | Status: DC | PRN
  Administered 2016-11-22: 50 mL

## 2016-11-22 MED ORDER — GLYCOPYRROLATE 0.2 MG/ML IJ SOLN
INTRAMUSCULAR | Status: DC | PRN
  Administered 2016-11-22: 0.6 mg via INTRAVENOUS

## 2016-11-22 MED ORDER — CEFAZOLIN SODIUM-DEXTROSE 2-4 GM/100ML-% IV SOLN
INTRAVENOUS | Status: AC
  Filled 2016-11-22: qty 100

## 2016-11-22 MED ORDER — ONDANSETRON HCL 4 MG/2ML IJ SOLN
INTRAMUSCULAR | Status: DC | PRN
  Administered 2016-11-22: 4 mg via INTRAVENOUS

## 2016-11-22 MED ORDER — DEXAMETHASONE SODIUM PHOSPHATE 10 MG/ML IJ SOLN
INTRAMUSCULAR | Status: DC | PRN
  Administered 2016-11-22: 10 mg via INTRAVENOUS

## 2016-11-22 MED ORDER — MIDAZOLAM HCL 2 MG/2ML IJ SOLN
INTRAMUSCULAR | Status: AC
  Filled 2016-11-22: qty 2

## 2016-11-22 MED ORDER — PROPOFOL 10 MG/ML IV BOLUS
INTRAVENOUS | Status: DC | PRN
  Administered 2016-11-22: 120 mg via INTRAVENOUS
  Administered 2016-11-22: 80 mg via INTRAVENOUS

## 2016-11-22 MED ORDER — ACETAMINOPHEN 650 MG RE SUPP
650.0000 mg | RECTAL | Status: DC | PRN
  Filled 2016-11-22: qty 1

## 2016-11-22 MED ORDER — TRAMADOL HCL 50 MG PO TABS: 50.0000 mg | ORAL_TABLET | Freq: Four times a day (QID) | ORAL | 0 refills | Status: DC | PRN

## 2016-11-22 MED ORDER — CEFAZOLIN SODIUM-DEXTROSE 2-4 GM/100ML-% IV SOLN
2.0000 g | INTRAVENOUS | Status: AC
  Administered 2016-11-22: 2 g via INTRAVENOUS

## 2016-11-22 MED ORDER — SODIUM CHLORIDE 0.9% FLUSH: 3.0000 mL | Freq: Two times a day (BID) | INTRAVENOUS | Status: DC

## 2016-11-22 MED ORDER — DEXAMETHASONE SODIUM PHOSPHATE 10 MG/ML IJ SOLN
INTRAMUSCULAR | Status: AC
  Filled 2016-11-22: qty 1

## 2016-11-22 MED ORDER — LIDOCAINE HCL (CARDIAC) 20 MG/ML IV SOLN
INTRAVENOUS | Status: DC | PRN
  Administered 2016-11-22: 50 mg via INTRAVENOUS

## 2016-11-22 MED ORDER — LIDOCAINE 2% (20 MG/ML) 5 ML SYRINGE
INTRAMUSCULAR | Status: AC
  Filled 2016-11-22: qty 5

## 2016-11-22 MED ORDER — SODIUM CHLORIDE 0.9% FLUSH: 3.0000 mL | INTRAVENOUS | Status: DC | PRN

## 2016-11-22 MED ORDER — FENTANYL CITRATE (PF) 100 MCG/2ML IJ SOLN
25.0000 ug | INTRAMUSCULAR | Status: DC | PRN
Start: 2016-11-22 — End: 2016-11-22
  Administered 2016-11-22 (×2): 50 ug via INTRAVENOUS

## 2016-11-22 MED ORDER — PROPOFOL 10 MG/ML IV BOLUS
INTRAVENOUS | Status: AC
  Filled 2016-11-22: qty 20

## 2016-11-22 SURGICAL SUPPLY — 30 items
CABLE HIGH FREQUENCY MONO STRZ (ELECTRODE) ×2 IMPLANT
CHLORAPREP W/TINT 26ML (MISCELLANEOUS) ×2 IMPLANT
COVER SURGICAL LIGHT HANDLE (MISCELLANEOUS) ×2 IMPLANT
DECANTER SPIKE VIAL GLASS SM (MISCELLANEOUS) ×2 IMPLANT
DEVICE SECURE STRAP 25 ABSORB (INSTRUMENTS) IMPLANT
DRAPE WARM FLUID 44X44 (DRAPE) ×2 IMPLANT
DRSG TEGADERM 2-3/8X2-3/4 SM (GAUZE/BANDAGES/DRESSINGS) ×3 IMPLANT
DRSG TEGADERM 4X4.75 (GAUZE/BANDAGES/DRESSINGS) ×2 IMPLANT
ELECT REM PT RETURN 15FT ADLT (MISCELLANEOUS) ×2 IMPLANT
GAUZE SPONGE 2X2 8PLY STRL LF (GAUZE/BANDAGES/DRESSINGS) ×1 IMPLANT
GLOVE ECLIPSE 8.0 STRL XLNG CF (GLOVE) ×2 IMPLANT
GLOVE INDICATOR 8.0 STRL GRN (GLOVE) ×2 IMPLANT
GOWN STRL REUS W/TWL XL LVL3 (GOWN DISPOSABLE) ×5 IMPLANT
IRRIG SUCT STRYKERFLOW 2 WTIP (MISCELLANEOUS)
IRRIGATION SUCT STRKRFLW 2 WTP (MISCELLANEOUS) IMPLANT
KIT BASIN OR (CUSTOM PROCEDURE TRAY) ×2 IMPLANT
MESH ULTRAPRO 6X6 15CM15CM (Mesh General) ×1 IMPLANT
PAD POSITIONING PINK XL (MISCELLANEOUS) ×2 IMPLANT
SCISSORS LAP 5X35 DISP (ENDOMECHANICALS) ×2 IMPLANT
SLEEVE ADV FIXATION 5X100MM (TROCAR) ×2 IMPLANT
SPONGE GAUZE 2X2 STER 10/PKG (GAUZE/BANDAGES/DRESSINGS) ×1
SUT MNCRL AB 4-0 PS2 18 (SUTURE) ×2 IMPLANT
SUT VIC AB 2-0 SH 27 (SUTURE)
SUT VIC AB 2-0 SH 27X BRD (SUTURE) IMPLANT
TACKER 5MM HERNIA 3.5CML NAB (ENDOMECHANICALS) IMPLANT
TOWEL OR 17X26 10 PK STRL BLUE (TOWEL DISPOSABLE) ×2 IMPLANT
TRAY LAPAROSCOPIC (CUSTOM PROCEDURE TRAY) ×2 IMPLANT
TROCAR ADV FIXATION 5X100MM (TROCAR) ×2 IMPLANT
TROCAR XCEL BLUNT TIP 100MML (ENDOMECHANICALS) ×2 IMPLANT
TUBING INSUF HEATED (TUBING) ×2 IMPLANT

## 2016-11-22 NOTE — Anesthesia Postprocedure Evaluation (Signed)
Anesthesia Post Note  Patient: Brian Ramirez  Procedure(s) Performed: Procedure(s) (LRB): LAPAROSCOPIC  REPAIR OF LEFT INGUINAL HERNIA WITH MESH (Left)  Patient location during evaluation: PACU Anesthesia Type: General Level of consciousness: awake and alert Pain management: pain level controlled Vital Signs Assessment: post-procedure vital signs reviewed and stable Respiratory status: spontaneous breathing, nonlabored ventilation, respiratory function stable and patient connected to nasal cannula oxygen Cardiovascular status: blood pressure returned to baseline and stable Postop Assessment: no signs of nausea or vomiting Anesthetic complications: no Comments: Given post op description of Anesthesia technique for future reference       Last Vitals:  Vitals:   11/22/16 0953 11/22/16 1043  BP: (!) 145/75 (!) 146/66  Pulse:  75  Resp: 16 16  Temp: 36.4 C     Last Pain:  Vitals:   11/22/16 1043  TempSrc:   PainSc: 2                  Karris Deangelo EDWARD

## 2016-11-22 NOTE — H&P (Signed)
Brian Ramirez 09/26/2016 1:33 PM Location: [facility] Patient #: [id number] DOB: Mar 15, 1946 Single / Language: [name] / Race: White Male  Patient Care Team: [name], MD as PCP - General (Family Medicine)   History of Present Illness  The patient is a 71 year old male who presents with an inguinal hernia. Note for "Inguinal hernia": Patient sent for surgical consultation at the request of his primary care physician, Dr. Lanette Hampshire. Concern for left inguinal hernia.  Pleasant active male. Had open right inguinal hernia repair with mesh in 2012 by former partner Dr. Lilyan Punt whom relocated back into the [employer]. He is noted some intermittent bulging on the left side suspicious for a left inguinal hernia. Nothing obvious felt on annual wellness visit but history suspicious. Surgical consultation requested.  Patient notes he gets occasional bulging and discomfort and he is exercising like doing sit ups. He had an open right inguinal hernia repair done under spinal anesthesia. He request recalls being told by anesthesia that his intubation risks were high due to a tight oropharynx and difficult airway. Therefore did under a spinal block. He did have urinary retention and wonders if he can avoid that. He prefer [employer] He has no problems with urination or defecation. Section active. He does say his girlfriend thought that sex was different after the inguinal hernia repair. Less volume of ejaculate? Patient did not have any wound infections. No fevers or chills. Moves his bowels about 4 times a day. Normal colonoscopy in the past. Does not smoke. No cardiac or pulmonary issues. Minimal mobility due to a fixed right knee. On disability somewhat. Does try to exercise with sit ups and a gym routine.   Past Surgical History (April Staton, CMA; 09/26/2016 1:33 PM) Open Inguinal Hernia Surgery  Right. Oral Surgery   Diagnostic Studies History  (April Staton, [place]; 09/26/2016 1:33 PM) Colonoscopy  >10 years ago  Allergies (April Staton, CMA; 09/26/2016 1:33 PM) No Known Drug Allergies 09/26/2016  Medication History (April Staton, [place]; 09/26/2016 1:33 PM) No Current Medications Medications Reconciled  Social History (April Staton, CMA; 09/26/2016 1:33 PM) Alcohol use  Moderate alcohol use. Caffeine use  Coffee. No drug use  Tobacco use  Former smoker.  Family History (April Staton, [place]; 09/26/2016 1:33 PM) Colon Cancer  Father. Heart Disease  Father.  Other Problems (April Staton, CMA; 09/26/2016 1:33 PM) Inguinal Hernia     Review of Systems  General Not Present- Appetite Loss, Chills, Fatigue, Fever, Night Sweats, Weight Gain and Weight Loss. Skin Not Present- Change in Wart/Mole, Dryness, Hives, Jaundice, New Lesions, Non-Healing Wounds, Rash and Ulcer. HEENT Not Present- Earache, Hearing Loss, Hoarseness, Nose Bleed, Oral Ulcers, Ringing in the Ears, Seasonal Allergies, Sinus Pain, Sore Throat, Visual Disturbances, Wears glasses/contact lenses and Yellow Eyes. Respiratory Not Present- Bloody sputum, Chronic Cough, Difficulty Breathing, Snoring and Wheezing. Breast Not Present- Breast Mass, Breast Pain, Nipple Discharge and Skin Changes. Cardiovascular Not Present- Chest Pain, Difficulty Breathing Lying Down, Leg Cramps, Palpitations, Rapid Heart Rate, Shortness of Breath and Swelling of Extremities. Gastrointestinal Not Present- Abdominal Pain, Bloating, Bloody Stool, Change in Bowel Habits, Chronic diarrhea, Constipation, Difficulty Swallowing, Excessive gas, Gets full quickly at meals, Hemorrhoids, Indigestion, Nausea, Rectal Pain and Vomiting. Male Genitourinary Not Present- Blood in Urine, Change in Urinary Stream, Frequency, Impotence, Nocturia, Painful Urination, Urgency and Urine Leakage.  Vitals (April Staton CMA; 09/26/2016 1:34 PM) 09/26/2016 1:33 PM Weight: 173.5 lb Height: 73in Body Surface Area:  2.03 m Body  Mass Index: 22.89 kg/m  Temp.: 98.51F(Oral)  Pulse: 70 (Regular)  BP: 140/78 (Sitting, Left Arm, Standard)   BP (!) 157/91   Pulse 74   Temp 97.8 F (36.6 C) (Oral)   Resp 18   Ht _0  (1.854 m)   Wt 78.9 kg (174 lb)   SpO2 95%   BMI 22.96 kg/m      Physical Exam Adin Hector MD; 09/26/2016 2:18 PM) Head and Neck Note: Neck somewhat stiff with limited motion.   Eye Note: Wears glasses. Vision corrected   ENMT Note: Can open-mouth about 2-1/2 cm wide. Motion. No hoarseness. No stridor.      Musculoskeletal Note: Fixed right knee with inward valgus. Walks with a limp  General: Pt awake/alert/oriented x4 in no major acute distress Eyes: PERRL, normal EOM. Sclera nonicteric Neuro: CN II-XII intact w/o focal sensory/motor deficits. Lymph: No head/neck/groin lymphadenopathy Psych:  No delerium/psychosis/paranoia HENT: Normocephalic, Mucus membranes moist.  No thrush Neck: Supple, No tracheal deviation Chest: No pain.  Good respiratory excursion. CV:  Pulses intact.  Regular rhythm MS: Normal AROM mjr joints.  No obvious deformity Abdomen: Soft, Nondistended.  Nontender.  No incarcerated hernias.  Male Genitourinary Note: Left groin bulging with Valsalva consistent with inguinal hernia. He feels more lateral. Perhaps an indirect.  No right inguinal hernia. Normal external genitalia. Epididymi, testes, and spermatic cords normal without any masses.  Ext:  SCDs BLE.  No significant edema.  No cyanosis Skin: No petechiae / purpura    Assessment & Plan Adin Hector MD; 09/26/2016 2:16 PM) LEFT INGUINAL HERNIA (K40.90) Impression: Thank you benefit from hernia repair. Standard of care is with mesh.  Usually I do these laparoscopic underlay TEP approach. However with his difficult airway, more likely may have to do a more open with regional anesthesia & MAC. We'll have anesthesia meet with the patient preoperatively and see what is  the safest plan. Would prefer to have this done at a hospital setting in case of airway issues that requires obeservation/admission. Patient prefers [facility]IN[facility] FOR GENERAL SURGICAL PROCEDURE (Z01.818) Current Plans You are being scheduled for surgery- Our schedulers will call you.  You should hear from our office's scheduling department within 5 working days about the location, date, and time of surgery. We try to make accommodations for patient's preferences in scheduling surgery, but sometimes the OR schedule or the surgeon's schedule prevents [place] from making those accommodations.  If you have not heard from our office [phone]) in 5 working days, call the office and ask for your surgeon's nurse.  If you have other questions about your diagnosis, plan, or surgery, call the office and ask for your surgeon's nurse.  Written instructions provided The anatomy & physiology of the abdominal wall and pelvic floor was discussed. The pathophysiology of hernias in the inguinal and pelvic region was discussed. Natural history risks such as progressive enlargement, pain, incarceration, and strangulation was discussed. Contributors to complications such as smoking, obesity, diabetes, prior surgery, etc were discussed.  I feel the risks of no intervention will lead to serious problems that outweigh the operative risks; therefore, I recommended surgery to reduce and repair the hernia. I explained laparoscopic techniques with possible need for an open approach. I noted usual use of mesh to patch and/or buttress hernia repair  Risks such as bleeding, infection, abscess, need for further treatment, heart attack, death, and other risks were discussed. I noted  a good likelihood this will help address the problem. Goals of post-operative recovery were discussed as well. Possibility that this will not correct all symptoms was  explained. I stressed the importance of low-impact activity, aggressive pain control, avoiding constipation, & not pushing through pain to minimize risk of post-operative chronic pain or injury. Possibility of reherniation was discussed. We will work to minimize complications.  An educational handout further explaining the pathology & treatment options was given as well. Questions were answered. The patient expresses understanding & wishes to proceed with surgery.  Pt Education - [facility] Pain Control (Odel Schmid) Pt Education - [facility] Hernia Post-Op HCI (Aveline Daus): discussed with patient and provided information. Pt Education - Pamphlet Given - Hernia Surgery: discussed with patient and provided information. DIFFICULT AIRWAY FOR INTUBATION, SUBSEQUENT ENCOUNTER (T88.4XXD) Impression: Patient with difficult airway for intubation. Dr. Landry Dyke with anesthesia apparently did not feel comfortable attempting endotracheal intubation in 2012. Last time he had to have spinal anesthesia and open inguinal hernia repair.  We'll have anesthesia meet with him at the preoperative visit and have a plan for his airway. Should they feel it is safe to intubate him, would do a laparoscopic repair as I think that is a less painful more durable repair. However, if there is doubt, then plan regional block with MAC & open repair w mesh. Patient notes he had urinary retention and difficulty with the spinal anesthesia. Perhaps a TAPP block could be done.  Adin Hector, M.D., F.A.C.S. Gastrointestinal and Minimally Invasive Surgery [facility] [address], [place], [place] [address] [phone] Main / Paging

## 2016-11-22 NOTE — Op Note (Signed)
11/22/2016  8:59 AM  PATIENT:  Brian Ramirez  71 y.o. male  Patient Care Team: [name], MD as PCP - General (Family Medicine) [name], MD as Consulting Physician (General Surgery)  PRE-OPERATIVE DIAGNOSIS:  Left inguinal new hernia  POST-OPERATIVE DIAGNOSIS:  Left indirect inguinal hernia  PROCEDURE:   LAPAROSCOPIC REPAIR OF LEFT INGUINAL HERNIA WITH MESH  SURGEON:  Adin Hector, MD  ASSISTANT: RN   ANESTHESIA:   regional and general Ilioinguinal, genitofemoral, and spermatic cord nerve blocks  EBL:  Total I/O In: 800 [I.V.:800] Out: 20 [Blood:20]  Delay start of Pharmacological VTE agent (>24hrs) due to surgical blood loss or risk of bleeding:  NO  DRAINS: none   SPECIMEN:  No Specimen  DISPOSITION OF SPECIMEN:  N/A  COUNTS:  YES  PLAN OF CARE: Discharge to home after PACU  PATIENT DISPOSITION:  PACU - hemodynamically stable.  INDICATION: Pleasant active male with history of open radial hernia repair many years ago.  Has to help hernia contralateral side.  He is very concerned about having a difficult airway area and numerous consultations made with anesthesia.  Recommendation to proceed with difficult airway protocol with glide scope protocol.  If not safe, proceed with open repair.  The anatomy & physiology of the abdominal wall and pelvic floor was discussed.  The pathophysiology of hernias in the inguinal and pelvic region was discussed.  Natural history risks such as progressive enlargement, pain, incarceration & strangulation was discussed.   Contributors to complications such as smoking, obesity, diabetes, prior surgery, etc were discussed.    I feel the risks of no intervention will lead to serious problems that outweigh the operative risks; therefore, I recommended surgery to reduce and repair the hernia.  I explained laparoscopic techniques with possible need for an open approach.  I noted usual use of mesh to patch and/or buttress hernia  repair  Risks such as bleeding, infection, abscess, need for further treatment, heart attack, death, and other risks were discussed.  I noted a good likelihood this will help address the problem.   Goals of post-operative recovery were discussed as well.  Possibility that this will not correct all symptoms was explained.  I stressed the importance of low-impact activity, aggressive pain control, avoiding constipation, & not pushing through pain to minimize risk of post-operative chronic pain or injury. Possibility of reherniation was discussed.  We will work to minimize complications.    An educational handout further explaining the pathology & treatment options was given as well.  Questions were answered.  The patient expresses understanding & wishes to proceed with surgery.  OR FINDINGS: Indirect inguinal hernia on left side.  No direct space obturator nor femoral hernia.  On the right side intact inguinal hernia repair.  No evidence of any recurrence.  No femoral obturator a direct space hernia.  DESCRIPTION:   The patient was identified & brought into the operating room. The patient was positioned supine with arms tucked. SCDs were active during the entire case. The patient underwent general anesthesia without any difficulty.  The abdomen was prepped and draped in a sterile fashion. A Surgical Timeout confirmed our plan.  I made a transverse incision through the inferior umbilical fold.  I made a small transverse nick through the anterior rectus fascia contralateral to the inguinal hernia side and placed a 0-vicryl stitch through the fascia.  I placed a Hasson trocar into the preperitoneal plane.  Entry was clean.  We induced carbon dioxide insufflation. Camera  inspection revealed no injury.  I used a 58m angled scope to bluntly free the peritoneum off the infraumbilical anterior abdominal wall.  I created enough of a preperitoneal pocket to place 540mports into the right & left mid-abdomen into  this preperitoneal cavity.  I used blunt & focused sharp dissection to free the peritoneum off the flank and down to the pubic rim.  I focused on the left side where the hernia was detected preoperatively.  I freed the anteriolateral bladder wall off the anteriolateral pelvic wall on that side, sparing midline attachments.    I could see the peritoneum going up into a hernia defect at the internal ring.  I freed the peritoneum off the spermatic vessels & vas deferens.  I freed peritoneum off the retroperitoneum along the psoas muscle.  Lead point spermatic cord lipomas were skeletonized and removed.  I looked on the contralateral right side.  Prior open inguinal hernia repair intact with no evidence of recurrence nor hernias on that side.  I chose a 15x15 cm sheet of mesh ( ultra-lightweight polypropylene = Ultrapro) to patch the hernia.  I cut a sigmoid-shaped slit within a side of the mesh, about 6cm from one edge.  I placed the mesh into the preperitoneal space & laid it as a diamond such that the inferior point had a 6x6 cm corner flap resting in the true pelvis, covering the obturator & femoral foramina.     I allowed the bladder to return to the pubis, this helping tuck the corners of the mesh in the anteriolateral pelvis.  The medial corners overlapped each other across midline cephalad to the pubic rim.   This provided >2 inch coverage around the hernia.   Because the defects well covered and not particularly large, I did not place any tacks.  I held the hernia sac cephalad & evacuated carbon dioxide.  I closed the infraumbilical wound with 0-vicryl suture at the fascia and 4-0 monocryl stitch at the skin.  Sterile dressings were applied. The patient was extubated & arrived in the PACU in stable condition..  I had discussed postoperative care with the patient in the holding area.   Patient has no family or friends here in the hospital nor did he wished me to call anyone to discuss intraoperative  findings.  Written instructions are written &  will be given to the patient at discharge as well.  StAdin HectorM.D., F.A.C.S. Gastrointestinal and Minimally Invasive Surgery [facility] [address]SuGlendorarBraddockNC [phone] / Paging

## 2016-11-22 NOTE — Transfer of Care (Signed)
Immediate Anesthesia Transfer of Care Note  Patient: Brian Ramirez  Procedure(s) Performed: Procedure(s): LAPAROSCOPIC  REPAIR OF LEFT INGUINAL HERNIA WITH MESH (Left)  Patient Location: PACU  Anesthesia Type:General  Level of Consciousness: awake, alert  and oriented  Airway & Oxygen Therapy: Patient Spontanous Breathing and Patient connected to face mask oxygen  Post-op Assessment: Report given to RN and Post -op Vital signs reviewed and stable  Post vital signs: Reviewed and stable  Last Vitals:  Vitals:   11/22/16 0524  BP: (!) 157/91  Pulse: 74  Resp: 18  Temp: 36.6 C    Last Pain:  Vitals:   11/22/16 0553  TempSrc:   PainSc: 0-No pain      Patients Stated Pain Goal: 3 (09/81/19 1478)  Complications: No apparent anesthesia complications

## 2016-11-22 NOTE — Anesthesia Procedure Notes (Signed)
Procedure Name: Intubation Date/Time: 11/22/2016 7:39 AM Performed by: Glory Buff Pre-anesthesia Checklist: Patient identified, Emergency Drugs available, Suction available and Patient being monitored Patient Re-evaluated:Patient Re-evaluated prior to inductionOxygen Delivery Method: Circle system utilized Preoxygenation: Pre-oxygenation with 100% oxygen Intubation Type: IV induction Ventilation: Mask ventilation with difficulty and Two handed mask ventilation required Laryngoscope Size: Glidescope and 3 Grade View: Grade III Tube type: Oral Tube size: 7.5 mm Number of attempts: 2 (First attempt made without muscle relaxant, able to view arytenoids, but unable to pass ETT with gliderite stylet, second attempt made with muscle relaxant and bougee stylet passes through cords and 7.5 ETT over stylet, +ETCO2., taped @ 22cm.) Airway Equipment and Method: Oral airway,  Bougie stylet and Video-laryngoscopy Placement Confirmation: ETT inserted through vocal cords under direct vision,  positive ETCO2 and breath sounds checked- equal and bilateral Secured at: 22 cm Tube secured with: Tape Dental Injury: Teeth and Oropharynx as per pre-operative assessment and Injury to lip  Difficulty Due To: Difficulty was anticipated and Difficult Airway- due to limited oral opening Future Recommendations: Recommend- induction with short-acting agent, and alternative techniques readily available

## 2016-11-22 NOTE — Discharge Instructions (Signed)
General Anesthesia, Adult, Care After These instructions provide you with information about caring for yourself after your procedure. Your health care provider may also give you more specific instructions. Your treatment has been planned according to current medical practices, but problems sometimes occur. Call your health care provider if you have any problems or questions after your procedure. What can I expect after the procedure? After the procedure, it is common to have:  Vomiting.  A sore throat.  Mental slowness. It is common to feel:  Nauseous.  Cold or shivery.  Sleepy.  Tired.  Sore or achy, even in parts of your body where you did not have surgery. Follow these instructions at home: For at least 24 hours after the procedure:   Do not:  Participate in activities where you could fall or become injured.  Drive.  Use heavy machinery.  Drink alcohol.  Take sleeping pills or medicines that cause drowsiness.  Make important decisions or sign legal documents.  Take care of children on your own.  Rest. Eating and drinking   If you vomit, drink water, juice, or soup when you can drink without vomiting.  Drink enough fluid to keep your urine clear or pale yellow.  Make sure you have little or no nausea before eating solid foods.  Follow the diet recommended by your health care provider. General instructions   Have a responsible adult stay with you until you are awake and alert.  Return to your normal activities as told by your health care provider. Ask your health care provider what activities are safe for you.  Take over-the-counter and prescription medicines only as told by your health care provider.  If you smoke, do not smoke without supervision.  Keep all follow-up visits as told by your health care provider. This is important. Contact a health care provider if:  You continue to have nausea or vomiting at home, and medicines are not helpful.  You  cannot drink fluids or start eating again.  You cannot urinate after 8-12 hours.  You develop a skin rash.  You have fever.  You have increasing redness at the site of your procedure. Get help right away if:  You have difficulty breathing.  You have chest pain.  You have unexpected bleeding.  You feel that you are having a life-threatening or urgent problem. This information is not intended to replace advice given to you by your health care provider. Make sure you discuss any questions you have with your health care provider. Document Released: 11/07/2000 Document Revised: 01/04/2016 Document Reviewed: 07/16/2015 Elsevier Interactive Patient Education  2017 [employer]: POST OP INSTRUCTIONS  ######################################################################  EAT Gradually transition to a high fiber diet with a fiber supplement over the next few weeks after discharge.  Start with a pureed / full liquid diet (see below)  WALK Walk an hour a day.  Control your pain to do that.    CONTROL PAIN Control pain so that you can walk, sleep, tolerate sneezing/coughing, go up/down stairs.  HAVE A BOWEL MOVEMENT DAILY Keep your bowels regular to avoid problems.  OK to try a laxative to override constipation.  OK to use an antidairrheal to slow down diarrhea.  Call if not better after 2 tries  CALL IF YOU HAVE PROBLEMS/CONCERNS Call if you are still struggling despite following these instructions. Call if you have concerns not answered by these instructions  ######################################################################    1. DIET: Follow a light bland diet the first 24 hours after  arrival home, such as soup, liquids, crackers, etc.  Be sure to include lots of fluids daily.  Avoid fast food or heavy meals as your are more likely to get nauseated.  Eat a low fat the next few days after surgery. 2. Take your usually prescribed home medications unless  otherwise directed. 3. PAIN CONTROL: a. Pain is best controlled by a usual combination of three different methods TOGETHER: i. Ice/Heat ii. Over the counter pain medication iii. Prescription pain medication b. Most patients will experience some swelling and bruising around the hernia(s) such as the bellybutton, groins, or old incisions.  Ice packs or heating pads (30-60 minutes up to 6 times a day) will help. Use ice for the first few days to help decrease swelling and bruising, then switch to heat to help relax tight/sore spots and speed recovery.  Some people prefer to use ice alone, heat alone, alternating between ice & heat.  Experiment to what works for you.  Swelling and bruising can take several weeks to resolve.   c. It is helpful to take an over-the-counter pain medication regularly for the first few weeks.  Choose one of the following that works best for you: i. Naproxen (Aleve, etc)  Two '220mg'$  tabs twice a day ii. Ibuprofen (Advil, etc) Three '200mg'$  tabs four times a day (every meal & bedtime) iii. Acetaminophen (Tylenol, etc) 325-'650mg'$  four times a day (every meal & bedtime) d. A  prescription for pain medication should be given to you upon discharge.  Take your pain medication as prescribed.  i. If you are having problems/concerns with the prescription medicine (does not control pain, nausea, vomiting, rash, itching, etc), please call us [phone] to see if we need to switch you to a different pain medicine that will work better for you and/or control your side effect better. ii. If you need a refill on your pain medication, please contact your pharmacy.  They will contact our office to request authorization. Prescriptions will not be filled after 5 pm or on week-ends. 4. Avoid getting constipated.  Between the surgery and the pain medications, it is common to experience some constipation.  Increasing fluid intake and taking a fiber supplement (such as Metamucil, Citrucel, FiberCon,  MiraLax, etc) 1-2 times a day regularly will usually help prevent this problem from occurring.  A mild laxative (prune juice, Milk of Magnesia, MiraLax, etc) should be taken according to package directions if there are no bowel movements after 48 hours.   5. Wash / shower every day.  You may shower over the dressings as they are waterproof.   6. Remove your waterproof bandages 5 days after surgery.  You may leave the incision open to air.  You may replace a dressing/Band-Aid to cover the incision for comfort if you wish.  Continue to shower over incision(s) after the dressing is off.    7. ACTIVITIES as tolerated:   a. You may resume regular (light) daily activities beginning the next day--such as daily self-care, walking, climbing stairs--gradually increasing activities as tolerated.  If you can walk 30 minutes without difficulty, it is safe to try more intense activity such as jogging, treadmill, bicycling, low-impact aerobics, swimming, etc. b. Save the most intensive and strenuous activity for last such as sit-ups, heavy lifting, contact sports, etc  Refrain from any heavy lifting or straining until you are off narcotics for pain control.   c. DO NOT PUSH THROUGH PAIN.  Let pain be your guide: If it hurts to do  something, don't do it.  Pain is your body warning you to avoid that activity for another week until the pain goes down. d. You may drive when you are no longer taking prescription pain medication, you can comfortably wear a seatbelt, and you can safely maneuver your car and apply brakes. e. Dennis Bast may have sexual intercourse when it is comfortable.  8. FOLLOW UP in our office a. Please call [facility] at [phone] to set up an appointment to see your surgeon in the office for a follow-up appointment approximately 2-3 weeks after your surgery. b. Make sure that you call for this appointment the day you arrive home to insure a convenient appointment time. 9.  IF YOU HAVE DISABILITY OR FAMILY  LEAVE FORMS, BRING THEM TO THE OFFICE FOR PROCESSING.  DO NOT GIVE THEM TO YOUR DOCTOR.  WHEN TO CALL us [phone]: 1. Poor pain control 2. Reactions / problems with new medications (rash/itching, nausea, etc)  3. Fever over 101.5 F (38.5 C) 4. Inability to urinate 5. Nausea and/or vomiting 6. Worsening swelling or bruising 7. Continued bleeding from incision. 8. Increased pain, redness, or drainage from the incision   The clinic staff is available to answer your questions during regular business hours (8:30am-5pm).  Please dont hesitate to call and ask to speak to one of our nurses for clinical concerns.   If you have a medical emergency, go to the nearest emergency room or call 911.  A surgeon from [facility] is always on call at the hospitals in [facility], [place], [place], [place], [place]  [address] ?  [address] [address], [place], [place]   [address] MAIN: [phone] ? TOLL FREE: [phone] ? FAX: [phone] [email or web address]

## 2016-11-22 NOTE — Interval H&P Note (Signed)
History and Physical Interval Note:  11/22/2016 7:03 AM  Brian Ramirez  has presented today for surgery, with the diagnosis of Left inguinal new hernia  The various methods of treatment have been discussed with the patient and family. After consideration of risks, benefits and other options for treatment, the patient has consented to  Procedure(s): OPEN (POSSIBLE LAPAROSCOPIC) EXPLORATION AND REPAIR OF LEFT INGUINAL HERNIA (Left) as a surgical intervention .  The patient's history has been reviewed, patient examined, no change in status, stable for surgery.  I have reviewed the patient's chart and labs.  Questions were answered to the patient's satisfaction.     Erick Oxendine C.

## 2017-01-26 DIAGNOSIS — H524 Presbyopia: Secondary | ICD-10-CM | POA: Diagnosis not present

## 2017-01-26 DIAGNOSIS — H25043 Posterior subcapsular polar age-related cataract, bilateral: Secondary | ICD-10-CM | POA: Diagnosis not present

## 2017-01-26 DIAGNOSIS — H2513 Age-related nuclear cataract, bilateral: Secondary | ICD-10-CM | POA: Diagnosis not present

## 2017-02-24 NOTE — Anesthesia Postprocedure Evaluation (Signed)
Anesthesia Post Note  Patient: Brian Ramirez  Procedure(s) Performed: Procedure(s) (LRB): LAPAROSCOPIC  REPAIR OF LEFT INGUINAL HERNIA WITH MESH (Left)     Anesthesia Post Evaluation  Last Vitals:  Vitals:   11/22/16 0953 11/22/16 1043  BP: (!) 145/75 (!) 146/66  Pulse:  75  Resp: 16 16  Temp: 36.4 C     Last Pain:  Vitals:   11/23/16 0831  TempSrc:   PainSc: 2                  [name] EDWARD

## 2017-02-24 NOTE — Addendum Note (Signed)
Addendum  created 02/24/17 1114 by Lyndle Herrlich, MD   Sign clinical note

## 2017-05-01 DIAGNOSIS — H25013 Cortical age-related cataract, bilateral: Secondary | ICD-10-CM | POA: Diagnosis not present

## 2017-05-01 DIAGNOSIS — H25043 Posterior subcapsular polar age-related cataract, bilateral: Secondary | ICD-10-CM | POA: Diagnosis not present

## 2017-05-01 DIAGNOSIS — H2513 Age-related nuclear cataract, bilateral: Secondary | ICD-10-CM | POA: Diagnosis not present

## 2017-09-21 DIAGNOSIS — M25561 Pain in right knee: Secondary | ICD-10-CM | POA: Diagnosis not present

## 2017-10-16 DIAGNOSIS — Z1159 Encounter for screening for other viral diseases: Secondary | ICD-10-CM | POA: Diagnosis not present

## 2017-10-16 DIAGNOSIS — N5201 Erectile dysfunction due to arterial insufficiency: Secondary | ICD-10-CM | POA: Diagnosis not present

## 2017-10-16 DIAGNOSIS — E7800 Pure hypercholesterolemia, unspecified: Secondary | ICD-10-CM | POA: Diagnosis not present

## 2017-10-16 DIAGNOSIS — R7301 Impaired fasting glucose: Secondary | ICD-10-CM | POA: Diagnosis not present

## 2017-10-16 DIAGNOSIS — Z79899 Other long term (current) drug therapy: Secondary | ICD-10-CM | POA: Diagnosis not present

## 2017-10-16 DIAGNOSIS — Z125 Encounter for screening for malignant neoplasm of prostate: Secondary | ICD-10-CM | POA: Diagnosis not present

## 2017-10-16 DIAGNOSIS — Z0001 Encounter for general adult medical examination with abnormal findings: Secondary | ICD-10-CM | POA: Diagnosis not present

## 2018-08-23 DIAGNOSIS — R61 Generalized hyperhidrosis: Secondary | ICD-10-CM | POA: Diagnosis not present

## 2018-08-23 DIAGNOSIS — R05 Cough: Secondary | ICD-10-CM | POA: Diagnosis not present

## 2018-09-04 ENCOUNTER — Other Ambulatory Visit: Payer: Self-pay

## 2018-09-04 ENCOUNTER — Inpatient Hospital Stay (HOSPITAL_COMMUNITY)
Admission: EM | Admit: 2018-09-04 | Discharge: 2018-09-13 | DRG: 180 | Disposition: A | Discharge status: Home Health | Payer: Medicare Other | Attending: Internal Medicine | Admitting: Internal Medicine

## 2018-09-04 ENCOUNTER — Emergency Department (HOSPITAL_COMMUNITY): Payer: Medicare Other

## 2018-09-04 ENCOUNTER — Encounter (HOSPITAL_COMMUNITY): Payer: Self-pay

## 2018-09-04 DIAGNOSIS — Z87442 Personal history of urinary calculi: Secondary | ICD-10-CM | POA: Diagnosis not present

## 2018-09-04 DIAGNOSIS — C3411 Malignant neoplasm of upper lobe, right bronchus or lung: Principal | ICD-10-CM | POA: Diagnosis present

## 2018-09-04 DIAGNOSIS — J90 Pleural effusion, not elsewhere classified: Secondary | ICD-10-CM

## 2018-09-04 DIAGNOSIS — R531 Weakness: Secondary | ICD-10-CM | POA: Diagnosis not present

## 2018-09-04 DIAGNOSIS — J189 Pneumonia, unspecified organism: Secondary | ICD-10-CM

## 2018-09-04 DIAGNOSIS — Z7982 Long term (current) use of aspirin: Secondary | ICD-10-CM

## 2018-09-04 DIAGNOSIS — Z9889 Other specified postprocedural states: Secondary | ICD-10-CM

## 2018-09-04 DIAGNOSIS — R091 Pleurisy: Secondary | ICD-10-CM | POA: Diagnosis not present

## 2018-09-04 DIAGNOSIS — J910 Malignant pleural effusion: Secondary | ICD-10-CM | POA: Diagnosis present

## 2018-09-04 DIAGNOSIS — R0602 Shortness of breath: Secondary | ICD-10-CM | POA: Diagnosis not present

## 2018-09-04 DIAGNOSIS — N179 Acute kidney failure, unspecified: Secondary | ICD-10-CM | POA: Diagnosis present

## 2018-09-04 DIAGNOSIS — D638 Anemia in other chronic diseases classified elsewhere: Secondary | ICD-10-CM | POA: Diagnosis present

## 2018-09-04 DIAGNOSIS — K449 Diaphragmatic hernia without obstruction or gangrene: Secondary | ICD-10-CM | POA: Diagnosis present

## 2018-09-04 DIAGNOSIS — R402252 Coma scale, best verbal response, oriented, at arrival to emergency department: Secondary | ICD-10-CM | POA: Diagnosis present

## 2018-09-04 DIAGNOSIS — E860 Dehydration: Secondary | ICD-10-CM | POA: Diagnosis present

## 2018-09-04 DIAGNOSIS — I700 Atherosclerosis of aorta: Secondary | ICD-10-CM | POA: Diagnosis not present

## 2018-09-04 DIAGNOSIS — S39012A Strain of muscle, fascia and tendon of lower back, initial encounter: Secondary | ICD-10-CM | POA: Diagnosis not present

## 2018-09-04 DIAGNOSIS — R0902 Hypoxemia: Secondary | ICD-10-CM | POA: Diagnosis not present

## 2018-09-04 DIAGNOSIS — R509 Fever, unspecified: Secondary | ICD-10-CM | POA: Diagnosis not present

## 2018-09-04 DIAGNOSIS — Z807 Family history of other malignant neoplasms of lymphoid, hematopoietic and related tissues: Secondary | ICD-10-CM

## 2018-09-04 DIAGNOSIS — R402142 Coma scale, eyes open, spontaneous, at arrival to emergency department: Secondary | ICD-10-CM | POA: Diagnosis present

## 2018-09-04 DIAGNOSIS — J9601 Acute respiratory failure with hypoxia: Secondary | ICD-10-CM

## 2018-09-04 DIAGNOSIS — Z808 Family history of malignant neoplasm of other organs or systems: Secondary | ICD-10-CM

## 2018-09-04 DIAGNOSIS — R918 Other nonspecific abnormal finding of lung field: Secondary | ICD-10-CM | POA: Diagnosis not present

## 2018-09-04 DIAGNOSIS — R402362 Coma scale, best motor response, obeys commands, at arrival to emergency department: Secondary | ICD-10-CM | POA: Diagnosis present

## 2018-09-04 DIAGNOSIS — R5381 Other malaise: Secondary | ICD-10-CM | POA: Diagnosis not present

## 2018-09-04 HISTORY — DX: Pleural effusion, not elsewhere classified: J90

## 2018-09-04 HISTORY — DX: Other nonspecific abnormal finding of lung field: R91.8

## 2018-09-04 LAB — CBC WITH DIFFERENTIAL/PLATELET
Abs Immature Granulocytes: 0.18 10*3/uL — ABNORMAL HIGH (ref 0.00–0.07)
BASOS ABS: 0 10*3/uL (ref 0.0–0.1)
BASOS PCT: 0 %
Eosinophils Absolute: 0 10*3/uL (ref 0.0–0.5)
Eosinophils Relative: 0 %
HCT: 33 % — ABNORMAL LOW (ref 39.0–52.0)
Hemoglobin: 10.3 g/dL — ABNORMAL LOW (ref 13.0–17.0)
Immature Granulocytes: 1 %
Lymphocytes Relative: 4 %
Lymphs Abs: 0.7 10*3/uL (ref 0.7–4.0)
MCH: 28.1 pg (ref 26.0–34.0)
MCHC: 31.2 g/dL (ref 30.0–36.0)
MCV: 89.9 fL (ref 80.0–100.0)
Monocytes Absolute: 1.8 10*3/uL — ABNORMAL HIGH (ref 0.1–1.0)
Monocytes Relative: 10 %
NEUTROS ABS: 15.8 10*3/uL — AB (ref 1.7–7.7)
Neutrophils Relative %: 85 %
PLATELETS: 457 10*3/uL — AB (ref 150–400)
RBC: 3.67 MIL/uL — ABNORMAL LOW (ref 4.22–5.81)
RDW: 12.8 % (ref 11.5–15.5)
WBC: 18.6 10*3/uL — AB (ref 4.0–10.5)
nRBC: 0 % (ref 0.0–0.2)

## 2018-09-04 LAB — BODY FLUID CELL COUNT WITH DIFFERENTIAL
Eos, Fluid: 2 %
Lymphs, Fluid: 8 %
Monocyte-Macrophage-Serous Fluid: 4 % — ABNORMAL LOW (ref 50–90)
Neutrophil Count, Fluid: 86 % — ABNORMAL HIGH (ref 0–25)
Total Nucleated Cell Count, Fluid: 4567 cu mm — ABNORMAL HIGH (ref 0–1000)

## 2018-09-04 LAB — COMPREHENSIVE METABOLIC PANEL
ALT: 24 U/L (ref 0–44)
AST: 24 U/L (ref 15–41)
Albumin: 2.8 g/dL — ABNORMAL LOW (ref 3.5–5.0)
Alkaline Phosphatase: 68 U/L (ref 38–126)
Anion gap: 11 (ref 5–15)
BUN: 26 mg/dL — ABNORMAL HIGH (ref 8–23)
CO2: 24 mmol/L (ref 22–32)
Calcium: 8.7 mg/dL — ABNORMAL LOW (ref 8.9–10.3)
Chloride: 97 mmol/L — ABNORMAL LOW (ref 98–111)
Creatinine, Ser: 1.33 mg/dL — ABNORMAL HIGH (ref 0.61–1.24)
GFR calc Af Amer: >60 mL/min (ref >60)
GFR calc non Af Amer: 53 mL/min — ABNORMAL LOW (ref >60)
Glucose, Bld: 207 mg/dL — ABNORMAL HIGH (ref 70–99)
POTASSIUM: 5 mmol/L (ref 3.5–5.1)
Sodium: 132 mmol/L — ABNORMAL LOW (ref 135–145)
Total Bilirubin: 1 mg/dL (ref 0.3–1.2)
Total Protein: 7.5 g/dL (ref 6.5–8.1)

## 2018-09-04 LAB — LACTATE DEHYDROGENASE, PLEURAL OR PERITONEAL FLUID: LD, Fluid: 466 U/L — ABNORMAL HIGH (ref 3–23)

## 2018-09-04 LAB — PROTEIN, PLEURAL OR PERITONEAL FLUID: Total protein, fluid: 5.5 g/dL

## 2018-09-04 LAB — TROPONIN I: Troponin I: <0.03 ng/mL (ref <0.03)

## 2018-09-04 LAB — GLUCOSE, PLEURAL OR PERITONEAL FLUID: Glucose, Fluid: 69 mg/dL

## 2018-09-04 LAB — BRAIN NATRIURETIC PEPTIDE: B Natriuretic Peptide: 79.1 pg/mL (ref 0.0–100.0)

## 2018-09-04 LAB — LACTIC ACID, PLASMA
LACTIC ACID, VENOUS: 2.1 mmol/L — AB (ref 0.5–1.9)
Lactic Acid, Venous: 1.3 mmol/L (ref 0.5–1.9)

## 2018-09-04 MED ORDER — SODIUM CHLORIDE (PF) 0.9 % IJ SOLN
INTRAMUSCULAR | Status: AC
  Filled 2018-09-04: qty 50

## 2018-09-04 MED ORDER — ENOXAPARIN SODIUM 40 MG/0.4ML ~~LOC~~ SOLN
40.0000 mg | SUBCUTANEOUS | Status: DC
  Administered 2018-09-04 – 2018-09-05 (×2): 40 mg via SUBCUTANEOUS
  Filled 2018-09-04 (×2): qty 0.4

## 2018-09-04 MED ORDER — ACETAMINOPHEN 650 MG RE SUPP: 650.0000 mg | Freq: Four times a day (QID) | RECTAL | Status: DC | PRN

## 2018-09-04 MED ORDER — SODIUM CHLORIDE 0.9 % IV BOLUS
1000.0000 mL | Freq: Once | INTRAVENOUS | Status: AC
  Administered 2018-09-04: 1000 mL via INTRAVENOUS

## 2018-09-04 MED ORDER — ACETAMINOPHEN 325 MG PO TABS
650.0000 mg | ORAL_TABLET | Freq: Four times a day (QID) | ORAL | Status: DC | PRN
  Administered 2018-09-08 – 2018-09-11 (×7): 650 mg via ORAL
  Filled 2018-09-04 (×8): qty 2

## 2018-09-04 MED ORDER — LIDOCAINE HCL 1 % IJ SOLN
INTRAMUSCULAR | Status: AC
  Filled 2018-09-04: qty 20

## 2018-09-04 MED ORDER — IOPAMIDOL (ISOVUE-370) INJECTION 76%
INTRAVENOUS | Status: AC
  Filled 2018-09-04: qty 100

## 2018-09-04 MED ORDER — LEVOFLOXACIN IN D5W 750 MG/150ML IV SOLN
750.0000 mg | Freq: Once | INTRAVENOUS | Status: AC
  Administered 2018-09-04: 750 mg via INTRAVENOUS
  Filled 2018-09-04: qty 150

## 2018-09-04 MED ORDER — IOPAMIDOL (ISOVUE-370) INJECTION 76%
100.0000 mL | Freq: Once | INTRAVENOUS | Status: AC | PRN
  Administered 2018-09-04: 71 mL via INTRAVENOUS

## 2018-09-04 MED ORDER — KETOROLAC TROMETHAMINE 15 MG/ML IJ SOLN
15.0000 mg | Freq: Four times a day (QID) | INTRAMUSCULAR | Status: AC | PRN
  Administered 2018-09-07: 15 mg via INTRAVENOUS
  Filled 2018-09-04: qty 1

## 2018-09-04 MED ORDER — BENZONATATE 100 MG PO CAPS
100.0000 mg | ORAL_CAPSULE | Freq: Three times a day (TID) | ORAL | Status: DC | PRN
  Administered 2018-09-10 (×2): 100 mg via ORAL
  Filled 2018-09-04 (×3): qty 1

## 2018-09-04 NOTE — ED Provider Notes (Signed)
[facility] DEPT Provider Note   CSN: [id number] Arrival date & time: 09/04/18  1303     History   Chief Complaint Chief Complaint  Patient presents with  . Shortness of Breath    HPI Brian Ramirez is a 73 y.o. male presents today for concern of shortness of breath.  Patient states over the past 1 week he has had increasing shortness of breath that is worse with exertion.  Patient states that last night he was unable to take more than 2 steps at a time without need to stop and catch his breath.  Additionally patient states that he has been feeling ill for the past 1 month, he endorses generalized weakness, shortness of breath, loss of appetite and night sweats.  Patient denies chest pain or any other areas of pain. HPI  Past Medical History:  Diagnosis Date  . Hernia 2012  . History of hiatal hernia   . History of kidney stones   . Personal history of kidney stones 1992    Patient Active Problem List   Diagnosis Date Noted  . Left inguinal hernia s/p lap repair 11/22/2016 09/26/2016  . Difficult airway for intubation 09/26/2016    Past Surgical History:  Procedure Laterality Date  . HERNIA REPAIR  2012   inguinal  . INGUINAL HERNIA REPAIR Left 11/22/2016   Procedure: LAPAROSCOPIC  REPAIR OF LEFT INGUINAL HERNIA WITH MESH;  Surgeon: [name], MD;  Location: [facility];  Service: General;  Laterality: Left;        Home Medications    Prior to Admission medications   Medication Sig Start Date End Date Taking? Authorizing Provider  ibuprofen (ADVIL,MOTRIN) 200 MG tablet Take 200 mg by mouth daily as needed for headache or moderate pain.    [provider]  Multiple Vitamin (MULTIVITAMIN WITH MINERALS) TABS tablet Take 1 tablet by mouth daily.    [provider]  sodium chloride (OCEAN) 0.65 % SOLN nasal spray Place 1 spray into both nostrils as needed (dryness).    [provider]  traMADol (ULTRAM) 50 MG  tablet Take 1-2 tablets (50-100 mg total) by mouth every 6 (six) hours as needed for moderate pain or severe pain. 11/22/16   [name], MD    Family History History reviewed. No pertinent family history.  Social History Social History   Tobacco Use  . Smoking status: Never Smoker  . Smokeless tobacco: Never Used  Substance Use Topics  . Alcohol use: Yes    Alcohol/week: 1.0 standard drinks    Types: 1 Glasses of wine per week  . Drug use: No     Allergies   Patient has no known allergies.   Review of Systems Review of Systems  Constitutional: Positive for appetite change and fatigue. Negative for chills and fever.  HENT: Negative.   Eyes: Negative.  Negative for visual disturbance.  Respiratory: Positive for shortness of breath. Negative for cough.   Cardiovascular: Negative.  Negative for chest pain.  Gastrointestinal: Negative.  Negative for abdominal pain, diarrhea, nausea and vomiting.  Neurological: Negative.  Negative for dizziness, weakness and headaches.  All other systems reviewed and are negative.  Physical Exam Updated Vital Signs BP 126/84   Pulse 96   Temp 98.5 F (36.9 C)   Resp 20   Ht 6\' 1"  (1.854 m)   Wt 74.8 kg   SpO2 94%   BMI 21.77 kg/m   Physical Exam Constitutional:      General:  He is not in acute distress.    Appearance: He is well-developed. He is not diaphoretic.  HENT:     Head: Normocephalic and atraumatic.     Right Ear: External ear normal.     Left Ear: External ear normal.     Nose: Nose normal.  Eyes:     Pupils: Pupils are equal, round, and reactive to light.  Neck:     Musculoskeletal: Normal range of motion.     Trachea: Trachea normal. No tracheal deviation.  Cardiovascular:     Rate and Rhythm: Regular rhythm. Tachycardia present.  Pulmonary:     Effort: Pulmonary effort is normal. No respiratory distress.     Breath sounds: Examination of the right-lower field reveals rhonchi. Rhonchi present. No wheezing.    Chest:     Chest wall: No tenderness.  Abdominal:     General: Bowel sounds are normal. There is no distension.     Palpations: Abdomen is soft.     Tenderness: There is no abdominal tenderness. There is no guarding or rebound.  Musculoskeletal: Normal range of motion.     Right lower leg: He exhibits no tenderness. No edema.     Left lower leg: He exhibits no tenderness. No edema.  Feet:     Right foot:     Protective Sensation: 3 sites tested. 3 sites sensed.     Left foot:     Protective Sensation: 3 sites tested. 3 sites sensed.  Skin:    General: Skin is warm and dry.     Capillary Refill: Capillary refill takes less than 2 seconds.  Neurological:     General: No focal deficit present.     Mental Status: He is alert and oriented to person, place, and time.     GCS: GCS eye subscore is 4. GCS verbal subscore is 5. GCS motor subscore is 6.  Psychiatric:        Behavior: Behavior normal.    ED Treatments / Results  Labs (all labs ordered are listed, but only abnormal results are displayed) Labs Reviewed  COMPREHENSIVE METABOLIC PANEL - Abnormal; Notable for the following components:      Result Value   Sodium 132 (*)    Chloride 97 (*)    Glucose, Bld 207 (*)    BUN 26 (*)    Creatinine, Ser 1.33 (*)    Calcium 8.7 (*)    Albumin 2.8 (*)    GFR calc non Af Amer 53 (*)    All other components within normal limits  CBC WITH DIFFERENTIAL/PLATELET - Abnormal; Notable for the following components:   WBC 18.6 (*)    RBC 3.67 (*)    Hemoglobin 10.3 (*)    HCT 33.0 (*)    Platelets 457 (*)    Neutro Abs 15.8 (*)    Monocytes Absolute 1.8 (*)    Abs Immature Granulocytes 0.18 (*)    All other components within normal limits  CULTURE, BLOOD (ROUTINE X 2)  CULTURE, BLOOD (ROUTINE X 2)  TROPONIN I  BRAIN NATRIURETIC PEPTIDE  LACTIC ACID, PLASMA  LACTIC ACID, PLASMA    EKG EKG Interpretation  Date/Time:  Tuesday September 04 2018 13:19:06 EST Ventricular Rate:   119 PR Interval:    QRS Duration: 89 QT Interval:  313 QTC Calculation: 441 R Axis:   84 Text Interpretation:  Sinus tachycardia Multiple premature complexes, vent & supraven LAE, consider biatrial enlargement Probable anterior infarct, old No significant change since last  tracing Confirmed by Wandra Arthurs [phone]) on 09/04/2018 2:44:46 PM   Radiology Ct Angio Chest Pe W And/or Wo Contrast  Result Date: 09/04/2018 CLINICAL DATA:  Pleural effusion and pulmonary nodules (suspicious for neoplasm) on chest radiograph. Shortness of breath. Evaluate for PE. EXAM: CT ANGIOGRAPHY CHEST WITH CONTRAST TECHNIQUE: Multidetector CT imaging of the chest was performed using the standard protocol during bolus administration of intravenous contrast. Multiplanar CT image reconstructions and MIPs were obtained to evaluate the vascular anatomy. CONTRAST:  74mL ISOVUE-370 IOPAMIDOL (ISOVUE-370) INJECTION 76% COMPARISON:  Chest radiograph dated 09/04/2018 FINDINGS: Cardiovascular: Satisfactory opacification of the bilateral pulmonary arteries to the lobar level. Segmental/segmental pulmonary arteries are limited due to motion degradation. No evidence of pulmonary embolism. No evidence of thoracic aortic aneurysm or dissection. Atherosclerotic calcifications of the aortic arch. Mild coronary atherosclerosis of the LAD. The heart is normal in size.  No pericardial effusion. Mediastinum/Nodes: 12 mm right hilar node (series 4/image 49). 9 mm subcarinal node (series 4/image 52). Visualized thyroid is unremarkable. Lungs/Pleura: Evaluation lung parenchyma is constrained by respiratory motion. 2.7 x 4.6 cm mass in the anterior right upper lung (series 4/image 29), suspicious for primary bronchogenic neoplasm. Additional 1.8 x 2.6 cm nodule centrally in the right upper lobe (series 4/image 47), suspicious. Large right pleural effusion with pleural-based nodularity/metastases (for example, series 4/image 104). Associated compressive  atelectasis of the right middle and lower lobes. Multiple pulmonary nodules in the left upper and lower lobes, lower lobe predominant, poorly evaluated due to motion degradation. Index nodule measures 14 mm in the central left lower lobe (series 10/image 28). These are compatible with metastases. Additional mild patchy opacity in the lingula (series 10/image 98). No pneumothorax. Upper Abdomen: Visualized upper abdomen is motion degraded but grossly unremarkable, noting aberrant perirenal vasculature along the right upper kidney (series 4/image 127). Musculoskeletal: Degenerative changes of the visualized thoracolumbar spine. Review of the MIP images confirms the above findings. IMPRESSION: No evidence of pulmonary embolism. 4.6 cm mass in the anterior right upper lung, suspicious for primary bronchogenic neoplasm versus pleural-based metastasis. Additional 2.6 cm nodule centrally in the right upper lobe, suspicious for primary bronchogenic neoplasm versus metastasis (related to the anterior mass). Large right pleural effusion with pleural-based metastases. Multiple pulmonary metastases in the left hemithorax. Possible small subcarinal and right hilar nodal metastases, indeterminate. Aortic Atherosclerosis (ICD10-I70.0). Electronically Signed   By: Julian Hy M.D.   On: 09/04/2018 14:53   Dg Chest Port 1 View  Result Date: 09/04/2018 CLINICAL DATA:  Shortness of Breath EXAM: PORTABLE CHEST 1 VIEW COMPARISON:  None. FINDINGS: Cardiac shadow is stable. Nodular changes are noted in the left lung base suspicious for metastatic disease. Large right-sided pleural effusion with underlying atelectasis is noted. Bony structures are within normal limits. IMPRESSION: Large right-sided pleural effusion with underlying atelectatic changes. Multiple nodular densities are noted over the left base suspicious for neoplastic involvement. CT is recommended for further evaluation. Electronically Signed   By: Inez Catalina  M.D.   On: 09/04/2018 14:33    Procedures Procedures (including critical care time)  Medications Ordered in ED Medications  levofloxacin (LEVAQUIN) IVPB 750 mg (750 mg Intravenous New Bag/Given 09/04/18 1510)  sodium chloride (PF) 0.9 % injection (has no administration in time range)  iopamidol (ISOVUE-370) 76 % injection (has no administration in time range)  sodium chloride 0.9 % bolus 1,000 mL (0 mLs Intravenous Stopped 09/04/18 1453)  iopamidol (ISOVUE-370) 76 % injection 100 mL (71 mLs Intravenous Contrast Given 09/04/18 1424)  Initial Impression / Assessment and Plan / ED Course  I have reviewed the triage vital signs and the nursing notes.  Pertinent labs & imaging results that were available during my care of the patient were reviewed by me and considered in my medical decision making (see chart for details).    73 year old male presenting for 1 week of shortness of breath.  On initial examination patient is tachycardic in the 120s with SPO2 of 95% complaining of shortness of breath.  Discussed with Dr. Darl Householder.  CT Angio of the chest has been ordered. ---------------- Patient seen and evaluated by Dr. Darl Householder, blood cultures and Levaquin added ---------------- CBC with leukocytosis of 18.6 CMP with multiple nonacute abnormalities BMP within normal limits Troponin negative EKG without acute changes reviewed by Dr. Darl Householder CT chest    IMPRESSION:  No evidence of pulmonary embolism.    4.6 cm mass in the anterior right upper lung, suspicious for primary  bronchogenic neoplasm versus pleural-based metastasis. Additional  2.6 cm nodule centrally in the right upper lobe, suspicious for  primary bronchogenic neoplasm versus metastasis (related to the  anterior mass).    Large right pleural effusion with pleural-based metastases.    Multiple pulmonary metastases in the left hemithorax.    Possible small subcarinal and right hilar nodal metastases,  indeterminate.    Aortic  Atherosclerosis (ICD10-I70.0).  ---------------------------- Dr. Darl Householder has discussed case with CT surgery, plan to admit, IR thoracentesis for therapeutic/diagnostic purposes.  Vital signs have remained stable. ----------------- Dr. Darl Householder has discussed Imaging results/conserns and care plans with the patient. Patient is agreeable to admission and plan of care. ------ 3:35PM: Discussed case with hospitalist who will see patient in ED for admission. -------- Patient taken to IR for thoracentesis.  Returned, states that he is feeling well and is in no acute distress.  He is eating food provided by nursing staff. ---------- Patient has been admitted to hospitalist service for further evaluation and management.   Note: Portions of this report may have been transcribed using voice recognition software. Every effort was made to ensure accuracy; however, inadvertent computerized transcription errors may still be present. Final Clinical Impressions(s) / ED Diagnoses   Final diagnoses:  Pleural effusion  SOB (shortness of breath)  Lung mass    ED Discharge Orders    None       Gari Crown 09/04/18 1826    Drenda Freeze, MD 09/05/18 [phone]

## 2018-09-04 NOTE — ED Notes (Addendum)
Date and time results received: 09/04/18  (use smartphrase ".now" to insert current time)  Test:  LACTIC  Critical Value: 2.1 Name of Provider Notified: EDPA   [facility] AND MIKE RN AWARE  Orders Received? Or Actions Taken?: Actions Taken: NO ORDERS GIVEN

## 2018-09-04 NOTE — H&P (Addendum)
History and Physical    Brian Ramirez GNF:[id number] DOB: January 14, 1946 DOA: 09/04/2018  PCP: [name], MD  Patient coming from: Home  Chief Complaint: SOB  HPI: Brian Ramirez is a 73 y.o. male with medical history significant of hernia, prior tobacco abuse presents to ED with gradually worsening sob over the past month. Symptoms began as generalized malaise around Christmas 2019 which progressed to worsening SOB. Patient reports sob worsened to where pt was unable to ambulate even 3-5 feet, requiring 20-30 min to catch his breath. Pt is previously very active and exercises and lifts weights regularly. Since feeling SOB, pt has been unable to remain active. Pt has otherwise denied florid wt loss but reports decreased appetite and feeling dehydrated. Pt states he was a former smoker, quit 41yrs ago  ED Course: In the ED, pt underwent CTA chest with findings of two R sided lung masses, measuring 4.6cm and 2.6cm with large R effusion. Pt since underwent IR guided thoracentesis. While getting procedure, Hospitalist consulted for consideration for admission  Review of Systems:  Review of Systems  Constitutional: Positive for diaphoresis and malaise/fatigue. Negative for weight loss.  HENT: Negative for congestion, ear discharge, ear pain and nosebleeds.   Eyes: Negative for double vision, photophobia, pain and discharge.  Respiratory: Positive for shortness of breath. Negative for hemoptysis.   Cardiovascular: Negative for palpitations, orthopnea and claudication.  Gastrointestinal: Negative for abdominal pain, nausea and vomiting.  Genitourinary: Negative for frequency, hematuria and urgency.  Musculoskeletal: Negative for back pain, joint pain and neck pain.  Neurological: Negative for tingling, tremors, seizures and loss of consciousness.  Psychiatric/Behavioral: Negative for hallucinations and substance abuse. The patient does not have insomnia.     Past Medical History:    Diagnosis Date  . Hernia 2012  . History of hiatal hernia   . History of kidney stones   . Personal history of kidney stones 1992    Past Surgical History:  Procedure Laterality Date  . HERNIA REPAIR  2012   inguinal  . INGUINAL HERNIA REPAIR Left 11/22/2016   Procedure: LAPAROSCOPIC  REPAIR OF LEFT INGUINAL HERNIA WITH MESH;  Surgeon: [name], MD;  Location: [facility];  Service: General;  Laterality: Left;     reports that he has never smoked. He has never used smokeless tobacco. He reports current alcohol use of about 1.0 standard drinks of alcohol per week. He reports that he does not use drugs.  No Known Allergies  Family Hx Two brothers deceased from cancer Father deceased from MI  Prior to Admission medications   Medication Sig Start Date End Date Taking? Authorizing Provider  aspirin 81 MG tablet Take 81 mg by mouth daily.   Yes [provider]  benzonatate (TESSALON) 100 MG capsule Take 100 mg by mouth every 8 (eight) hours as needed for cough.   Yes [provider]  ibuprofen (ADVIL,MOTRIN) 200 MG tablet Take 200 mg by mouth daily as needed for headache or moderate pain.   Yes [provider]  Multiple Vitamin (MULTIVITAMIN WITH MINERALS) TABS tablet Take 1 tablet by mouth daily.   Yes [provider]  traMADol (ULTRAM) 50 MG tablet Take 1-2 tablets (50-100 mg total) by mouth every 6 (six) hours as needed for moderate pain or severe pain. Patient not taking: Reported on 09/04/2018 11/22/16   [name], MD    Physical Exam: Vitals:   09/04/18 1313 09/04/18 1314 09/04/18 1511  BP: (!) 145/98  126/84  Pulse: Marland Kitchen)  121  96  Resp: 20  20  Temp: 98.5 F (36.9 C)    SpO2:   94%  Weight:  74.8 kg   Height:  6\' 1"  (1.854 m)     Constitutional: NAD, calm, comfortable Vitals:   09/04/18 1313 09/04/18 1314 09/04/18 1511  BP: (!) 145/98  126/84  Pulse: (!) 121  96  Resp: 20  20  Temp: 98.5 F (36.9 C)    SpO2:   94%  Weight:   74.8 kg   Height:  6\' 1"  (1.854 m)    Eyes: PERRL, lids and conjunctivae normal ENMT: Mucous membranes are dry. Posterior pharynx clear of any exudate or lesions.Normal dentition.  Neck: normal, supple, no masses, no thyromegaly Respiratory: clear to auscultation bilaterally, no wheezing, Cardiovascular: Regular rate and rhythm, s1, s2 Abdomen: no tenderness, no masses palpated. No hepatosplenomegaly. Bowel sounds positive.  Musculoskeletal: no clubbing / cyanosis. No joint deformity upper and lower extremities. Good ROM, no contractures. Normal muscle tone.  Skin: no rashes, lesions, ulcers. No induration, poor skin turgor Neurologic: CN 2-12 grossly intact. Sensation intact, DTR normal. Strength 5/5 in all 4.  Psychiatric: Normal judgment and insight. Alert and oriented x 3. Normal mood.    Labs on Admission: I have personally reviewed following labs and imaging studies  CBC: Recent Labs  Lab 09/04/18 1335  WBC 18.6*  NEUTROABS 15.8*  HGB 10.3*  HCT 33.0*  MCV 89.9  PLT 267*   Basic Metabolic Panel: Recent Labs  Lab 09/04/18 1335  NA 132*  K 5.0  CL 97*  CO2 24  GLUCOSE 207*  BUN 26*  CREATININE 1.33*  CALCIUM 8.7*   GFR: Estimated Creatinine Clearance: 53.9 mL/min (A) (by C-G formula based on SCr of 1.33 mg/dL (H)). Liver Function Tests: Recent Labs  Lab 09/04/18 1335  AST 24  ALT 24  ALKPHOS 68  BILITOT 1.0  PROT 7.5  ALBUMIN 2.8*   No results for input(s): LIPASE, AMYLASE in the last 168 hours. No results for input(s): AMMONIA in the last 168 hours. Coagulation Profile: No results for input(s): INR, PROTIME in the last 168 hours. Cardiac Enzymes: Recent Labs  Lab 09/04/18 1334  TROPONINI <0.03   BNP (last 3 results) No results for input(s): PROBNP in the last 8760 hours. HbA1C: No results for input(s): HGBA1C in the last 72 hours. CBG: No results for input(s): GLUCAP in the last 168 hours. Lipid Profile: No results for input(s): CHOL, HDL,  LDLCALC, TRIG, CHOLHDL, LDLDIRECT in the last 72 hours. Thyroid Function Tests: No results for input(s): TSH, T4TOTAL, FREET4, T3FREE, THYROIDAB in the last 72 hours. Anemia Panel: No results for input(s): VITAMINB12, FOLATE, FERRITIN, TIBC, IRON, RETICCTPCT in the last 72 hours. Urine analysis: No results found for: COLORURINE, APPEARANCEUR, LABSPEC, PHURINE, GLUCOSEU, HGBUR, BILIRUBINUR, KETONESUR, PROTEINUR, UROBILINOGEN, NITRITE, LEUKOCYTESUR Sepsis Labs: !!!!!!!!!!!!!!!!!!!!!!!!!!!!!!!!!!!!!!!!!!!! @LABRCNTIP (procalcitonin:4,lacticidven:4) )No results found for this or any previous visit (from the past 240 hour(s)).   Radiological Exams on Admission: Ct Angio Chest Pe W And/or Wo Contrast  Result Date: 09/04/2018 CLINICAL DATA:  Pleural effusion and pulmonary nodules (suspicious for neoplasm) on chest radiograph. Shortness of breath. Evaluate for PE. EXAM: CT ANGIOGRAPHY CHEST WITH CONTRAST TECHNIQUE: Multidetector CT imaging of the chest was performed using the standard protocol during bolus administration of intravenous contrast. Multiplanar CT image reconstructions and MIPs were obtained to evaluate the vascular anatomy. CONTRAST:  47mL ISOVUE-370 IOPAMIDOL (ISOVUE-370) INJECTION 76% COMPARISON:  Chest radiograph dated 09/04/2018 FINDINGS: Cardiovascular: Satisfactory opacification of the  bilateral pulmonary arteries to the lobar level. Segmental/segmental pulmonary arteries are limited due to motion degradation. No evidence of pulmonary embolism. No evidence of thoracic aortic aneurysm or dissection. Atherosclerotic calcifications of the aortic arch. Mild coronary atherosclerosis of the LAD. The heart is normal in size.  No pericardial effusion. Mediastinum/Nodes: 12 mm right hilar node (series 4/image 49). 9 mm subcarinal node (series 4/image 52). Visualized thyroid is unremarkable. Lungs/Pleura: Evaluation lung parenchyma is constrained by respiratory motion. 2.7 x 4.6 cm mass in the  anterior right upper lung (series 4/image 29), suspicious for primary bronchogenic neoplasm. Additional 1.8 x 2.6 cm nodule centrally in the right upper lobe (series 4/image 47), suspicious. Large right pleural effusion with pleural-based nodularity/metastases (for example, series 4/image 104). Associated compressive atelectasis of the right middle and lower lobes. Multiple pulmonary nodules in the left upper and lower lobes, lower lobe predominant, poorly evaluated due to motion degradation. Index nodule measures 14 mm in the central left lower lobe (series 10/image 28). These are compatible with metastases. Additional mild patchy opacity in the lingula (series 10/image 98). No pneumothorax. Upper Abdomen: Visualized upper abdomen is motion degraded but grossly unremarkable, noting aberrant perirenal vasculature along the right upper kidney (series 4/image 127). Musculoskeletal: Degenerative changes of the visualized thoracolumbar spine. Review of the MIP images confirms the above findings. IMPRESSION: No evidence of pulmonary embolism. 4.6 cm mass in the anterior right upper lung, suspicious for primary bronchogenic neoplasm versus pleural-based metastasis. Additional 2.6 cm nodule centrally in the right upper lobe, suspicious for primary bronchogenic neoplasm versus metastasis (related to the anterior mass). Large right pleural effusion with pleural-based metastases. Multiple pulmonary metastases in the left hemithorax. Possible small subcarinal and right hilar nodal metastases, indeterminate. Aortic Atherosclerosis (ICD10-I70.0). Electronically Signed   By: Julian Hy M.D.   On: 09/04/2018 14:53   Dg Chest Port 1 View  Result Date: 09/04/2018 CLINICAL DATA:  Shortness of Breath EXAM: PORTABLE CHEST 1 VIEW COMPARISON:  None. FINDINGS: Cardiac shadow is stable. Nodular changes are noted in the left lung base suspicious for metastatic disease. Large right-sided pleural effusion with underlying  atelectasis is noted. Bony structures are within normal limits. IMPRESSION: Large right-sided pleural effusion with underlying atelectatic changes. Multiple nodular densities are noted over the left base suspicious for neoplastic involvement. CT is recommended for further evaluation. Electronically Signed   By: Inez Catalina M.D.   On: 09/04/2018 14:33    EKG: Independently reviewed. Sinus tach  Assessment/Plan Principal Problem:   Pleural effusion Active Problems:   Lung mass Renal failure  1. Large R pleural effusion 1. Large R sided pleural effusion note on presenting imaging 2. Currently undergoing IR guided thoracentesis 3. Follow up on fluid analysis and cytology 4. Cont to monitor on tele 2. Lung mass 1. 4.6cm and 2.6cm R lung masses noted on CT chest, reviewed 2. Findings are worrisome for malignancy 3. Follow up on above cytology. Pending results, would formally consult Oncology at that time 3. Renal failure, unknown if acute or chronic 1. Presenting Cr of 1.3. No prior Cr to compare to 2. Will repeat bmet in AM 3. Start IVF per below 4. Dehydration 1. Mucus membranes dry, poor skin turgor 2. Will start pt on LR 5. Former tobacco abuse 1. Quit 15yrs prior to visit  DVT prophylaxis: Lovenox subQ  Code Status: Full Family Communication: Pt in room  Disposition Plan: Uncertain at this time  Consults called:  Admission status: Observation as would likely require less than 2  midnight stay to stabilize respiratory status   Marylu Lund MD [facility] Pager On Amion  If 7PM-7AM, please contact night-coverage  09/04/2018, 3:37 PM

## 2018-09-04 NOTE — Procedures (Signed)
Ultrasound-guided diagnostic and therapeutic right thoracentesis performed yielding 2.3 liters dark, bloody fluid. No immediate complications. Follow-up chest x-ray pending.The fluid was sent to the lab for preordered studies. EBL< 1cc.

## 2018-09-04 NOTE — ED Notes (Signed)
ED TO INPATIENT HANDOFF REPORT  Name/Age/Gender Brian Ramirez 73 y.o. male  Code Status    Code Status Orders  (From admission, onward)         Start     Ordered   09/04/18 1751  Full code  Continuous     09/04/18 1751        Code Status History    This patient has a current code status but no historical code status.      Home/SNF/Other Home  Chief Complaint short of bre  Level of Care/Admitting Diagnosis ED Disposition    ED Disposition Condition [facility] Area: [place] [100102]  Level of Care: Telemetry [5]  Admit to tele based on following criteria: Complex arrhythmia (Bradycardia/Tachycardia)  Diagnosis: Mass of right lung [1829937]  Admitting Physician: Donne Hazel [[id number]]  Attending Physician: CHIU, STEPHEN K [[id number]]  PT Class (Do Not Modify): Observation [104]  PT Acc Code (Do Not Modify): Observation [10022]       Medical History Past Medical History:  Diagnosis Date  . Hernia 2012  . History of hiatal hernia   . History of kidney stones   . Personal history of kidney stones 1992    Allergies No Known Allergies  IV Location/Drains/Wounds Patient Lines/Drains/Airways Status   Active Line/Drains/Airways    Name:   Placement date:   Placement time:   Site:   Days:   Peripheral IV 09/04/18 Left Antecubital   09/04/18    1333    Antecubital   less than 1   Incision (Closed) 11/22/16 Abdomen   11/22/16    0852     651   Incision - 3 Ports Abdomen Umbilicus Right;Mid Left;Mid   11/22/16    0848     651          Labs/Imaging Results for orders placed or performed during the hospital encounter of 09/04/18 (from the past 48 hour(s))  Brain natriuretic peptide     Status: None   Collection Time: 09/04/18  1:34 PM  Result Value Ref Range   B Natriuretic Peptide 79.1 0.0 - 100.0 pg/mL    Comment: Performed at [facility], [place] [address] [place], [place] [address]  Troponin I -  ONCE - STAT     Status: None   Collection Time: 09/04/18  1:34 PM  Result Value Ref Range   Troponin I <0.03 <0.03 ng/mL    Comment: Performed at [facility], [place] [address], [place], [place] [address]  Comprehensive metabolic panel     Status: Abnormal   Collection Time: 09/04/18  1:35 PM  Result Value Ref Range   Sodium 132 (L) 135 - 145 mmol/L   Potassium 5.0 3.5 - 5.1 mmol/L   Chloride 97 (L) 98 - 111 mmol/L   CO2 24 22 - 32 mmol/L   Glucose, Bld 207 (H) 70 - 99 mg/dL   BUN 26 (H) 8 - 23 mg/dL   Creatinine, Ser 1.33 (H) 0.61 - 1.24 mg/dL   Calcium 8.7 (L) 8.9 - 10.3 mg/dL   Total Protein 7.5 6.5 - 8.1 g/dL   Albumin 2.8 (L) 3.5 - 5.0 g/dL   AST 24 15 - 41 U/L   ALT 24 0 - 44 U/L   Alkaline Phosphatase 68 38 - 126 U/L   Total Bilirubin 1.0 0.3 - 1.2 mg/dL   GFR calc non Af Amer 53 (L) >60 mL/min   GFR  calc Af Amer >60 >60 mL/min   Anion gap 11 5 - 15    Comment: Performed at [facility], [place] [address] [place], [place] [address]  CBC with Differential     Status: Abnormal   Collection Time: 09/04/18  1:35 PM  Result Value Ref Range   WBC 18.6 (H) 4.0 - 10.5 K/uL   RBC 3.67 (L) 4.22 - 5.81 MIL/uL   Hemoglobin 10.3 (L) 13.0 - 17.0 g/dL   HCT 33.0 (L) 39.0 - 52.0 %   MCV 89.9 80.0 - 100.0 fL   MCH 28.1 26.0 - 34.0 pg   MCHC 31.2 30.0 - 36.0 g/dL   RDW 12.8 11.5 - 15.5 %   Platelets 457 (H) 150 - 400 K/uL   nRBC 0.0 0.0 - 0.2 %   Neutrophils Relative % 85 %   Neutro Abs 15.8 (H) 1.7 - 7.7 K/uL   Lymphocytes Relative 4 %   Lymphs Abs 0.7 0.7 - 4.0 K/uL   Monocytes Relative 10 %   Monocytes Absolute 1.8 (H) 0.1 - 1.0 K/uL   Eosinophils Relative 0 %   Eosinophils Absolute 0.0 0.0 - 0.5 K/uL   Basophils Relative 0 %   Basophils Absolute 0.0 0.0 - 0.1 K/uL   Immature Granulocytes 1 %   Abs Immature Granulocytes 0.18 (H) 0.00 - 0.07 K/uL    Comment: Performed at [facility], [place] [address], [place], [place]  [address]  Lactic acid, plasma     Status: Abnormal   Collection Time: 09/04/18  3:01 PM  Result Value Ref Range   Lactic Acid, Venous 2.1 (HH) 0.5 - 1.9 mmol/L    Comment: CRITICAL RESULT CALLED TO, READ BACK BY AND VERIFIED WITH: A.LASSITER AT 1637 ON 09/04/18 BY N.THOMPSON Performed at [facility], [place] [address], [place], [place] [address]   Body fluid cell count with differential     Status: Abnormal   Collection Time: 09/04/18  4:58 PM  Result Value Ref Range   Fluid Type-FCT PLEURAL     Comment: RIGHT CORRECTED ON 01/21 AT 1711: PREVIOUSLY REPORTED AS Pleural R    Color, Fluid RED (A) YELLOW   Appearance, Fluid TURBID (A) CLEAR   WBC, Fluid 4,567 (H) 0 - 1,000 cu mm   Neutrophil Count, Fluid 86 (H) 0 - 25 %   Lymphs, Fluid 8 %   Monocyte-Macrophage-Serous Fluid 4 (L) 50 - 90 %   Eos, Fluid 2 %   Other Cells, Fluid CORRELATE WITH CYTOLOGY. %    Comment: Performed at [facility], [place] [address] [place], [place] [address]  Lactic acid, plasma     Status: None   Collection Time: 09/04/18  5:04 PM  Result Value Ref Range   Lactic Acid, Venous 1.3 0.5 - 1.9 mmol/L    Comment: Performed at [facility], [place] [address], [place], [place] [address]   Dg Chest 1 View  Result Date: 09/04/2018 CLINICAL DATA:  Status post thoracentesis EXAM: CHEST  1 VIEW COMPARISON:  Chest radiograph 09/04/2017 FINDINGS: Decreased size of right pleural effusion, now occupying approximately 1/3 the volume of the right hemithorax previously 2/3. No pneumothorax. Nodular opacity in the left mid lung corresponds to findings on recent chest CT. Cardiomediastinal size is normal. IMPRESSION: Decreased size of right pleural effusion. No pneumothorax. Electronically Signed   By: Ulyses Jarred M.D.   On: 09/04/2018 16:46   Ct Angio Chest Pe W And/or Wo  Contrast  Result Date: 09/04/2018 CLINICAL DATA:  Pleural effusion and pulmonary nodules (suspicious for  neoplasm) on chest radiograph. Shortness of breath. Evaluate for PE. EXAM: CT ANGIOGRAPHY CHEST WITH CONTRAST TECHNIQUE: Multidetector CT imaging of the chest was performed using the standard protocol during bolus administration of intravenous contrast. Multiplanar CT image reconstructions and MIPs were obtained to evaluate the vascular anatomy. CONTRAST:  76mL ISOVUE-370 IOPAMIDOL (ISOVUE-370) INJECTION 76% COMPARISON:  Chest radiograph dated 09/04/2018 FINDINGS: Cardiovascular: Satisfactory opacification of the bilateral pulmonary arteries to the lobar level. Segmental/segmental pulmonary arteries are limited due to motion degradation. No evidence of pulmonary embolism. No evidence of thoracic aortic aneurysm or dissection. Atherosclerotic calcifications of the aortic arch. Mild coronary atherosclerosis of the LAD. The heart is normal in size.  No pericardial effusion. Mediastinum/Nodes: 12 mm right hilar node (series 4/image 49). 9 mm subcarinal node (series 4/image 52). Visualized thyroid is unremarkable. Lungs/Pleura: Evaluation lung parenchyma is constrained by respiratory motion. 2.7 x 4.6 cm mass in the anterior right upper lung (series 4/image 29), suspicious for primary bronchogenic neoplasm. Additional 1.8 x 2.6 cm nodule centrally in the right upper lobe (series 4/image 47), suspicious. Large right pleural effusion with pleural-based nodularity/metastases (for example, series 4/image 104). Associated compressive atelectasis of the right middle and lower lobes. Multiple pulmonary nodules in the left upper and lower lobes, lower lobe predominant, poorly evaluated due to motion degradation. Index nodule measures 14 mm in the central left lower lobe (series 10/image 28). These are compatible with metastases. Additional mild patchy opacity in the lingula (series 10/image 98). No pneumothorax. Upper Abdomen: Visualized upper abdomen is motion degraded but grossly unremarkable, noting aberrant perirenal  vasculature along the right upper kidney (series 4/image 127). Musculoskeletal: Degenerative changes of the visualized thoracolumbar spine. Review of the MIP images confirms the above findings. IMPRESSION: No evidence of pulmonary embolism. 4.6 cm mass in the anterior right upper lung, suspicious for primary bronchogenic neoplasm versus pleural-based metastasis. Additional 2.6 cm nodule centrally in the right upper lobe, suspicious for primary bronchogenic neoplasm versus metastasis (related to the anterior mass). Large right pleural effusion with pleural-based metastases. Multiple pulmonary metastases in the left hemithorax. Possible small subcarinal and right hilar nodal metastases, indeterminate. Aortic Atherosclerosis (ICD10-I70.0). Electronically Signed   By: Julian Hy M.D.   On: 09/04/2018 14:53   Dg Chest Port 1 View  Result Date: 09/04/2018 CLINICAL DATA:  Shortness of Breath EXAM: PORTABLE CHEST 1 VIEW COMPARISON:  None. FINDINGS: Cardiac shadow is stable. Nodular changes are noted in the left lung base suspicious for metastatic disease. Large right-sided pleural effusion with underlying atelectasis is noted. Bony structures are within normal limits. IMPRESSION: Large right-sided pleural effusion with underlying atelectatic changes. Multiple nodular densities are noted over the left base suspicious for neoplastic involvement. CT is recommended for further evaluation. Electronically Signed   By: Inez Catalina M.D.   On: 09/04/2018 14:33   US Thoracentesis Asp Pleural Space W/img Guide  Result Date: 09/04/2018 INDICATION: Patient with history of dyspnea, imaging findings of right lung mass with multiple pulmonary metastases, large right pleural effusion. Request made for diagnostic and therapeutic right thoracentesis. EXAM: ULTRASOUND GUIDED DIAGNOSTIC AND THERAPEUTIC RIGHT THORACENTESIS MEDICATIONS: None COMPLICATIONS: None immediate. PROCEDURE: An ultrasound guided thoracentesis was  thoroughly discussed with the patient and questions answered. The benefits, risks, alternatives and complications were also discussed. The patient understands and wishes to proceed with the procedure. Written consent was obtained. Ultrasound was performed to localize and mark an adequate pocket of  fluid in the right chest. The area was then prepped and draped in the normal sterile fashion. 1% Lidocaine was used for local anesthesia. Under ultrasound guidance a 6 Fr Safe-T-Centesis catheter was introduced. Thoracentesis was performed. The catheter was removed and a dressing applied. FINDINGS: A total of approximately 2.3 liters of dark,bloody fluid was removed. Samples were sent to the laboratory as requested by the clinical team. IMPRESSION: Successful ultrasound guided diagnostic and therapeutic right thoracentesis yielding 2.3 liters of pleural fluid. Read by: Rowe Robert, PA-C Electronically Signed   By: Aletta Edouard M.D.   On: 09/04/2018 16:46   EKG Interpretation  Date/Time:  Tuesday September 04 2018 13:19:06 EST Ventricular Rate:  119 PR Interval:    QRS Duration: 89 QT Interval:  313 QTC Calculation: 441 R Axis:   84 Text Interpretation:  Sinus tachycardia Multiple premature complexes, vent & supraven LAE, consider biatrial enlargement Probable anterior infarct, old No significant change since last tracing Confirmed by Wandra Arthurs [phone]) on 09/04/2018 2:44:46 PM   Pending Labs Unresulted Labs (From admission, onward)    Start     Ordered   09/11/18 0500  Creatinine, serum  (enoxaparin (LOVENOX)    CrCl >/= 30 ml/min)  Weekly,   R    Comments:  while on enoxaparin therapy    09/04/18 1751   09/05/18 0500  CBC  Tomorrow morning,   R     09/04/18 1751   09/05/18 0500  Comprehensive metabolic panel  Tomorrow morning,   R     09/04/18 1751   09/04/18 1751  CBC  (enoxaparin (LOVENOX)    CrCl >/= 30 ml/min)  Once,   R    Comments:  Baseline for enoxaparin therapy IF NOT ALREADY DRAWN.   Notify MD if PLT < 100 K.    09/04/18 1751   09/04/18 1751  Creatinine, serum  (enoxaparin (LOVENOX)    CrCl >/= 30 ml/min)  Once,   R    Comments:  Baseline for enoxaparin therapy IF NOT ALREADY DRAWN.    09/04/18 1751   09/04/18 1658  Body fluid culture  R     09/04/18 1658   09/04/18 1658  Protein, pleural or peritoneal fluid  R     09/04/18 1658   09/04/18 1658  Glucose, pleural or peritoneal fluid  R     09/04/18 1658   09/04/18 1658  Lactate dehydrogenase (pleural or peritoneal fluid)  R     09/04/18 1658   09/04/18 1407  Blood culture (routine x 2)  BLOOD CULTURE X 2,   STAT     09/04/18 1406          Vitals/Pain Today's Vitals   09/04/18 1550 09/04/18 1611 09/04/18 1623 09/04/18 1644  BP: 127/70 126/83 (!) 128/93 120/72  Pulse:    96  Resp:    18  Temp:      SpO2:    93%  Weight:      Height:      PainSc:        Isolation Precautions No active isolations  Medications Medications  sodium chloride (PF) 0.9 % injection (has no administration in time range)  iopamidol (ISOVUE-370) 76 % injection (has no administration in time range)  enoxaparin (LOVENOX) injection 40 mg (has no administration in time range)  acetaminophen (TYLENOL) tablet 650 mg (has no administration in time range)    Or  acetaminophen (TYLENOL) suppository 650 mg (has no administration in time range)  ketorolac (TORADOL)  15 MG/ML injection 15 mg (has no administration in time range)  lidocaine (XYLOCAINE) 1 % (with pres) injection (has no administration in time range)  benzonatate (TESSALON) capsule 100 mg (has no administration in time range)  sodium chloride 0.9 % bolus 1,000 mL (0 mLs Intravenous Stopped 09/04/18 1453)  levofloxacin (LEVAQUIN) IVPB 750 mg (0 mg Intravenous Stopped 09/04/18 1644)  iopamidol (ISOVUE-370) 76 % injection 100 mL (71 mLs Intravenous Contrast Given 09/04/18 1424)    Mobility walks

## 2018-09-04 NOTE — ED Triage Notes (Signed)
EMS reports pt feeling ill since Christmas, saw family MD on Jan 9, and cleared, Pt feelling worse since, having exhertional SOB, lost appetite, and sweating.  BP 132/76 HR 123 RR 20 Sp02 88 RA on scene

## 2018-09-05 DIAGNOSIS — I700 Atherosclerosis of aorta: Secondary | ICD-10-CM | POA: Diagnosis present

## 2018-09-05 DIAGNOSIS — R402252 Coma scale, best verbal response, oriented, at arrival to emergency department: Secondary | ICD-10-CM | POA: Diagnosis present

## 2018-09-05 DIAGNOSIS — N179 Acute kidney failure, unspecified: Secondary | ICD-10-CM | POA: Diagnosis present

## 2018-09-05 DIAGNOSIS — E860 Dehydration: Secondary | ICD-10-CM | POA: Diagnosis present

## 2018-09-05 DIAGNOSIS — Z807 Family history of other malignant neoplasms of lymphoid, hematopoietic and related tissues: Secondary | ICD-10-CM | POA: Diagnosis not present

## 2018-09-05 DIAGNOSIS — C3491 Malignant neoplasm of unspecified part of right bronchus or lung: Secondary | ICD-10-CM | POA: Diagnosis not present

## 2018-09-05 DIAGNOSIS — K449 Diaphragmatic hernia without obstruction or gangrene: Secondary | ICD-10-CM | POA: Diagnosis present

## 2018-09-05 DIAGNOSIS — R222 Localized swelling, mass and lump, trunk: Secondary | ICD-10-CM | POA: Diagnosis not present

## 2018-09-05 DIAGNOSIS — Z87442 Personal history of urinary calculi: Secondary | ICD-10-CM | POA: Diagnosis not present

## 2018-09-05 DIAGNOSIS — Z7982 Long term (current) use of aspirin: Secondary | ICD-10-CM | POA: Diagnosis not present

## 2018-09-05 DIAGNOSIS — J189 Pneumonia, unspecified organism: Secondary | ICD-10-CM | POA: Diagnosis present

## 2018-09-05 DIAGNOSIS — J90 Pleural effusion, not elsewhere classified: Secondary | ICD-10-CM | POA: Diagnosis not present

## 2018-09-05 DIAGNOSIS — D649 Anemia, unspecified: Secondary | ICD-10-CM | POA: Diagnosis not present

## 2018-09-05 DIAGNOSIS — R5081 Fever presenting with conditions classified elsewhere: Secondary | ICD-10-CM | POA: Diagnosis not present

## 2018-09-05 DIAGNOSIS — C801 Malignant (primary) neoplasm, unspecified: Secondary | ICD-10-CM | POA: Diagnosis not present

## 2018-09-05 DIAGNOSIS — J9601 Acute respiratory failure with hypoxia: Secondary | ICD-10-CM | POA: Diagnosis not present

## 2018-09-05 DIAGNOSIS — C384 Malignant neoplasm of pleura: Secondary | ICD-10-CM | POA: Diagnosis not present

## 2018-09-05 DIAGNOSIS — Z808 Family history of malignant neoplasm of other organs or systems: Secondary | ICD-10-CM | POA: Diagnosis not present

## 2018-09-05 DIAGNOSIS — Z87891 Personal history of nicotine dependence: Secondary | ICD-10-CM | POA: Diagnosis not present

## 2018-09-05 DIAGNOSIS — C3411 Malignant neoplasm of upper lobe, right bronchus or lung: Secondary | ICD-10-CM | POA: Diagnosis present

## 2018-09-05 DIAGNOSIS — D638 Anemia in other chronic diseases classified elsewhere: Secondary | ICD-10-CM | POA: Diagnosis present

## 2018-09-05 DIAGNOSIS — R402142 Coma scale, eyes open, spontaneous, at arrival to emergency department: Secondary | ICD-10-CM | POA: Diagnosis present

## 2018-09-05 DIAGNOSIS — R0600 Dyspnea, unspecified: Secondary | ICD-10-CM | POA: Diagnosis not present

## 2018-09-05 DIAGNOSIS — R05 Cough: Secondary | ICD-10-CM | POA: Diagnosis not present

## 2018-09-05 DIAGNOSIS — J910 Malignant pleural effusion: Secondary | ICD-10-CM | POA: Diagnosis not present

## 2018-09-05 DIAGNOSIS — S39012A Strain of muscle, fascia and tendon of lower back, initial encounter: Secondary | ICD-10-CM | POA: Diagnosis not present

## 2018-09-05 DIAGNOSIS — R402362 Coma scale, best motor response, obeys commands, at arrival to emergency department: Secondary | ICD-10-CM | POA: Diagnosis present

## 2018-09-05 DIAGNOSIS — R918 Other nonspecific abnormal finding of lung field: Secondary | ICD-10-CM | POA: Diagnosis not present

## 2018-09-05 LAB — COMPREHENSIVE METABOLIC PANEL
ALK PHOS: 57 U/L (ref 38–126)
ALT: 24 U/L (ref 0–44)
AST: 28 U/L (ref 15–41)
Albumin: 2.2 g/dL — ABNORMAL LOW (ref 3.5–5.0)
Anion gap: 8 (ref 5–15)
BUN: 21 mg/dL (ref 8–23)
CO2: 25 mmol/L (ref 22–32)
Calcium: 8.4 mg/dL — ABNORMAL LOW (ref 8.9–10.3)
Chloride: 100 mmol/L (ref 98–111)
Creatinine, Ser: 0.97 mg/dL (ref 0.61–1.24)
GFR calc Af Amer: >60 mL/min (ref >60)
GFR calc non Af Amer: >60 mL/min (ref >60)
Glucose, Bld: 112 mg/dL — ABNORMAL HIGH (ref 70–99)
Potassium: 4.6 mmol/L (ref 3.5–5.1)
Sodium: 133 mmol/L — ABNORMAL LOW (ref 135–145)
Total Bilirubin: 0.8 mg/dL (ref 0.3–1.2)
Total Protein: 6 g/dL — ABNORMAL LOW (ref 6.5–8.1)

## 2018-09-05 LAB — CBC
HCT: 27.1 % — ABNORMAL LOW (ref 39.0–52.0)
Hemoglobin: 8.4 g/dL — ABNORMAL LOW (ref 13.0–17.0)
MCH: 28.4 pg (ref 26.0–34.0)
MCHC: 31 g/dL (ref 30.0–36.0)
MCV: 91.6 fL (ref 80.0–100.0)
NRBC: 0 % (ref 0.0–0.2)
Platelets: 320 10*3/uL (ref 150–400)
RBC: 2.96 MIL/uL — ABNORMAL LOW (ref 4.22–5.81)
RDW: 13.1 % (ref 11.5–15.5)
WBC: 14.7 10*3/uL — ABNORMAL HIGH (ref 4.0–10.5)

## 2018-09-05 MED ORDER — ORAL CARE MOUTH RINSE
15.0000 mL | Freq: Two times a day (BID) | OROMUCOSAL | Status: DC
  Administered 2018-09-05 – 2018-09-13 (×16): 15 mL via OROMUCOSAL

## 2018-09-05 NOTE — Progress Notes (Signed)
MEWS score of 4 (RED) called to Dr.Grunz.  Vital signs at baseline and pt is not in acute distress.  Will not initiate RED MEWS guidelines at this time. RRT nurse made aware. Will continue to monitor pt. Stacey Drain

## 2018-09-05 NOTE — Progress Notes (Signed)
   09/05/18 0755  Vitals  Temp 99.1 F (37.3 C)  Temp Source Oral  BP 131/72  MAP (mmHg) 89  BP Location Right Arm  BP Method Automatic  Pulse Rate (!) 111  Pulse Rate Source Monitor  Resp (!) 26  Oxygen Therapy  SpO2 96 %  O2 Device Nasal Cannula  O2 Flow Rate (L/min) 2.5 L/min  MEWS Score  MEWS RR 2  MEWS Pulse 2  MEWS Systolic 0  MEWS LOC 0  MEWS Temp 0  MEWS Score 4  MEWS Score Color Red

## 2018-09-05 NOTE — Plan of Care (Signed)
  Problem: Education: Goal: Knowledge of General Education information will improve Description Including pain rating scale, medication(s)/side effects and non-pharmacologic comfort measures Outcome: Completed/Met
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
## 2018-09-05 NOTE — Progress Notes (Signed)
PROGRESS NOTE  Brian Ramirez  NWG:[id number] DOB: August 09, 1946 DOA: 09/04/2018 PCP: [name], MD   Brief Narrative: Brian Ramirez is a 73 y.o. male with medical history significant of hernia, prior tobacco abuse presents to ED with gradually worsening sob over the past month. Symptoms began as generalized malaise around Christmas 2019 which progressed to worsening SOB. Patient reports sob worsened to where pt was unable to ambulate even 3-5 feet, requiring 20-30 min to catch his breath. Pt is previously very active and exercises and lifts weights regularly. Since feeling SOB, pt has been unable to remain active. Pt has otherwise denied florid wt loss but reports decreased appetite and feeling dehydrated. Pt states he was a former smoker, quit 65yrs ago  ED Course: In the ED, pt underwent CTA chest with findings of two R sided lung masses, measuring 4.6cm and 2.6cm with large R effusion. Pt since underwent IR guided thoracentesis. While getting procedure, Hospitalist consulted for consideration for admission.   Assessment & Plan: Principal Problem:   Pleural effusion Active Problems:   Lung mass   Mass of right lung  Acute hypoxic respiratory failure: Due to effusion and primary lung lesions. Has leukocytosis but is afebrile and no pulmonary infiltrate noted on CT. - Continue supplemental oxygen to maintain SpO2 >90%.   Large right-sided pleural effusion: In setting of right lung masses, suspect malignant etiology. Exudative by light's criteria.  - Follow up cytology. If unrevealing, will discuss with pulmonology Re: bronchoscopy/Bx and/or IR.  - Repeat CXR in AM as serial exams today have shown increasing evidence of reaccumulation. ?if will require pleurx  Lung masses in RUL x2 (4.6cm, 2.6cm) as well as likely metastases on the left and right hilar, subcarinal lymph nodes seen on CT imaging. - F/u cytology in this former smoker. As above, will d/w IR, pulmonology regarding  tissue Dx if cytology negative.  AKI: Uncertain baseline, but CrCl has normalized >30ml/min from Cr 1.33 on admission to 0.97.   Anemia of chronic disease: Suspected Dx - Anemia panel and repeat CBC in AM.   Dehydration: Note decrease in all cell lines with hydration.  - Taking better po, so will DC IVF's   Remote tobacco use:  - Noted.  DVT prophylaxis: Lovenox Code Status: Full Family Communication: None at bedside Disposition Plan: Anticipate DC home once respiratory status improved more near baseline and demonstrates stability with exudative effusion. This patient previously worked out daily, had very good exercise capacity. Though symptomatically improved after thoracentesis, the departure from his baseline remains stark. MEWS score this morning was 4 and tachypnea continues. Will convert him to inpatient as evaluation and management is expected to cross 2 midnights.   Consultants:   Interventional radiology  Procedures:   Thoracentesis 09/04/2018  Antimicrobials:  None   Subjective: Breathing better since thoracentesis, but very short of breath with ~140ft ambulation. He previously performed aerobic workouts regularly. No chest pain, no leg swelling, PND, productive cough, or fevers.  Objective: Vitals:   09/05/18 1052 09/05/18 1055 09/05/18 1246 09/05/18 1544  BP: 132/67  111/82 125/77  Pulse: (!) 118 (!) 102 (!) 102 100  Resp: (!) 22  (!) 24 20  Temp: 98.4 F (36.9 C)  98.6 F (37 C) 99.8 F (37.7 C)  TempSrc: Oral  Oral Oral  SpO2: 95%  97% 97%  Weight:      Height:        Intake/Output Summary (Last 24 hours) at 09/05/2018 2000 Last data filed at 09/05/2018  1355 Gross per 24 hour  Intake 240 ml  Output 850 ml  Net -610 ml   Filed Weights   09/04/18 1314 09/04/18 1851  Weight: 74.8 kg 70.7 kg    Gen: 73 y.o. male in no distress Pulm: Tachypneic at rest, increased respiratory effort with minimal exertion. Decreased sounds at R > L bases and dullness  to percussion to right mid hemithorax. The level of dullness felt to be higher in PM than AM examinations. No wheezing. CV: Regular borderline tachycardia. No murmur, rub, or gallop. No JVD, no pedal edema. GI: Abdomen soft, non-tender, non-distended, with normoactive bowel sounds. No organomegaly or masses felt. Ext: Warm, no deformities Skin: No rashes, lesions or ulcers Neuro: Alert and oriented. No focal neurological deficits. Psych: Judgement and insight appear normal. Mood & affect appropriate.   Data Reviewed: I have personally reviewed following labs and imaging studies  CBC: Recent Labs  Lab 09/04/18 1335 09/05/18 0459  WBC 18.6* 14.7*  NEUTROABS 15.8*  --   HGB 10.3* 8.4*  HCT 33.0* 27.1*  MCV 89.9 91.6  PLT 457* 381   Basic Metabolic Panel: Recent Labs  Lab 09/04/18 1335 09/05/18 0459  NA 132* 133*  K 5.0 4.6  CL 97* 100  CO2 24 25  GLUCOSE 207* 112*  BUN 26* 21  CREATININE 1.33* 0.97  CALCIUM 8.7* 8.4*   GFR: Estimated Creatinine Clearance: 69.8 mL/min (by C-G formula based on SCr of 0.97 mg/dL). Liver Function Tests: Recent Labs  Lab 09/04/18 1335 09/05/18 0459  AST 24 28  ALT 24 24  ALKPHOS 68 57  BILITOT 1.0 0.8  PROT 7.5 6.0*  ALBUMIN 2.8* 2.2*   Cardiac Enzymes: Recent Labs  Lab 09/04/18 1334  TROPONINI <0.03   Recent Results (from the past 240 hour(s))  Blood culture (routine x 2)     Status: None (Preliminary result)   Collection Time: 09/04/18  3:01 PM  Result Value Ref Range Status   Specimen Description   Final    BLOOD RIGHT HAND Performed at [facility] [address], [place], [place] [address]    Special Requests   Final    BOTTLES DRAWN AEROBIC AND ANAEROBIC Blood Culture adequate volume Performed at [facility] [address], [place], [place] [address]    Culture   Final    NO GROWTH < 24 HOURS Performed at [facility] [address], [place], [place] [address]     Report Status PENDING  Incomplete  Body fluid culture     Status: None (Preliminary result)   Collection Time: 09/04/18  4:58 PM  Result Value Ref Range Status   Specimen Description   Final    PLEURAL RIGHT Performed at [facility] [address] [place], [place] [address]    Special Requests   Final    NONE Performed at [facility], [place] [address] [place], [place] [address]    Gram Stain   Final    ABUNDANT WBC PRESENT,BOTH PMN AND MONONUCLEAR NO ORGANISMS SEEN    Culture   Final    NO GROWTH < 12 HOURS Performed at [facility] [address] [place], [place] [address]    Report Status PENDING  Incomplete  Blood culture (routine x 2)     Status: None (Preliminary result)   Collection Time: 09/04/18  7:46 PM  Result Value Ref Range Status   Specimen Description   Final  BLOOD RIGHT ANTECUBITAL Performed at [facility] [address], [place], [place] [address]    Special Requests   Final    BOTTLES DRAWN AEROBIC AND ANAEROBIC Blood Culture adequate volume Performed at [facility] [address], [place], [place] [address]    Culture   Final    NO GROWTH < 24 HOURS Performed at [facility] [address], [place], [place] [address]    Report Status PENDING  Incomplete      Radiology Studies: Dg Chest 1 View  Result Date: 09/04/2018 CLINICAL DATA:  Status post thoracentesis EXAM: CHEST  1 VIEW COMPARISON:  Chest radiograph 09/04/2017 FINDINGS: Decreased size of right pleural effusion, now occupying approximately 1/3 the volume of the right hemithorax previously 2/3. No pneumothorax. Nodular opacity in the left mid lung corresponds to findings on recent chest CT. Cardiomediastinal size is normal. IMPRESSION: Decreased size of right pleural effusion. No pneumothorax. Electronically Signed   By: Ulyses Jarred M.D.   On: 09/04/2018 16:46   Ct Angio Chest Pe W And/or Wo  Contrast  Result Date: 09/04/2018 CLINICAL DATA:  Pleural effusion and pulmonary nodules (suspicious for neoplasm) on chest radiograph. Shortness of breath. Evaluate for PE. EXAM: CT ANGIOGRAPHY CHEST WITH CONTRAST TECHNIQUE: Multidetector CT imaging of the chest was performed using the standard protocol during bolus administration of intravenous contrast. Multiplanar CT image reconstructions and MIPs were obtained to evaluate the vascular anatomy. CONTRAST:  47mL ISOVUE-370 IOPAMIDOL (ISOVUE-370) INJECTION 76% COMPARISON:  Chest radiograph dated 09/04/2018 FINDINGS: Cardiovascular: Satisfactory opacification of the bilateral pulmonary arteries to the lobar level. Segmental/segmental pulmonary arteries are limited due to motion degradation. No evidence of pulmonary embolism. No evidence of thoracic aortic aneurysm or dissection. Atherosclerotic calcifications of the aortic arch. Mild coronary atherosclerosis of the LAD. The heart is normal in size.  No pericardial effusion. Mediastinum/Nodes: 12 mm right hilar node (series 4/image 49). 9 mm subcarinal node (series 4/image 52). Visualized thyroid is unremarkable. Lungs/Pleura: Evaluation lung parenchyma is constrained by respiratory motion. 2.7 x 4.6 cm mass in the anterior right upper lung (series 4/image 29), suspicious for primary bronchogenic neoplasm. Additional 1.8 x 2.6 cm nodule centrally in the right upper lobe (series 4/image 47), suspicious. Large right pleural effusion with pleural-based nodularity/metastases (for example, series 4/image 104). Associated compressive atelectasis of the right middle and lower lobes. Multiple pulmonary nodules in the left upper and lower lobes, lower lobe predominant, poorly evaluated due to motion degradation. Index nodule measures 14 mm in the central left lower lobe (series 10/image 28). These are compatible with metastases. Additional mild patchy opacity in the lingula (series 10/image 98). No pneumothorax. Upper  Abdomen: Visualized upper abdomen is motion degraded but grossly unremarkable, noting aberrant perirenal vasculature along the right upper kidney (series 4/image 127). Musculoskeletal: Degenerative changes of the visualized thoracolumbar spine. Review of the MIP images confirms the above findings. IMPRESSION: No evidence of pulmonary embolism. 4.6 cm mass in the anterior right upper lung, suspicious for primary bronchogenic neoplasm versus pleural-based metastasis. Additional 2.6 cm nodule centrally in the right upper lobe, suspicious for primary bronchogenic neoplasm versus metastasis (related to the anterior mass). Large right pleural effusion with pleural-based metastases. Multiple pulmonary metastases in the left hemithorax. Possible small subcarinal and right hilar nodal metastases, indeterminate. Aortic Atherosclerosis (ICD10-I70.0). Electronically Signed   By: Julian Hy M.D.   On: 09/04/2018 14:53   Dg Chest Port 1 View  Result Date: 09/04/2018 CLINICAL DATA:  Shortness of  Breath EXAM: PORTABLE CHEST 1 VIEW COMPARISON:  None. FINDINGS: Cardiac shadow is stable. Nodular changes are noted in the left lung base suspicious for metastatic disease. Large right-sided pleural effusion with underlying atelectasis is noted. Bony structures are within normal limits. IMPRESSION: Large right-sided pleural effusion with underlying atelectatic changes. Multiple nodular densities are noted over the left base suspicious for neoplastic involvement. CT is recommended for further evaluation. Electronically Signed   By: Inez Catalina M.D.   On: 09/04/2018 14:33   US Thoracentesis Asp Pleural Space W/img Guide  Result Date: 09/04/2018 INDICATION: Patient with history of dyspnea, imaging findings of right lung mass with multiple pulmonary metastases, large right pleural effusion. Request made for diagnostic and therapeutic right thoracentesis. EXAM: ULTRASOUND GUIDED DIAGNOSTIC AND THERAPEUTIC RIGHT THORACENTESIS  MEDICATIONS: None COMPLICATIONS: None immediate. PROCEDURE: An ultrasound guided thoracentesis was thoroughly discussed with the patient and questions answered. The benefits, risks, alternatives and complications were also discussed. The patient understands and wishes to proceed with the procedure. Written consent was obtained. Ultrasound was performed to localize and mark an adequate pocket of fluid in the right chest. The area was then prepped and draped in the normal sterile fashion. 1% Lidocaine was used for local anesthesia. Under ultrasound guidance a 6 Fr Safe-T-Centesis catheter was introduced. Thoracentesis was performed. The catheter was removed and a dressing applied. FINDINGS: A total of approximately 2.3 liters of dark,bloody fluid was removed. Samples were sent to the laboratory as requested by the clinical team. IMPRESSION: Successful ultrasound guided diagnostic and therapeutic right thoracentesis yielding 2.3 liters of pleural fluid. Read by: Rowe Robert, PA-C Electronically Signed   By: Aletta Edouard M.D.   On: 09/04/2018 16:46    Scheduled Meds: . enoxaparin (LOVENOX) injection  40 mg Subcutaneous Q24H  . mouth rinse  15 mL Mouth Rinse BID   Continuous Infusions:   LOS: 0 days   Time spent: 35 minutes.  Patrecia Pour, MD [facility] [email or web address] Password [facility] 09/05/2018, 8:00 PM

## 2018-09-06 ENCOUNTER — Inpatient Hospital Stay (HOSPITAL_COMMUNITY): Payer: Medicare Other

## 2018-09-06 LAB — FOLATE: Folate: 13.2 ng/mL (ref >5.9)

## 2018-09-06 LAB — CBC
HCT: 25.8 % — ABNORMAL LOW (ref 39.0–52.0)
Hemoglobin: 7.9 g/dL — ABNORMAL LOW (ref 13.0–17.0)
MCH: 28.5 pg (ref 26.0–34.0)
MCHC: 30.6 g/dL (ref 30.0–36.0)
MCV: 93.1 fL (ref 80.0–100.0)
NRBC: 0 % (ref 0.0–0.2)
Platelets: 309 10*3/uL (ref 150–400)
RBC: 2.77 MIL/uL — ABNORMAL LOW (ref 4.22–5.81)
RDW: 13.2 % (ref 11.5–15.5)
WBC: 12.2 10*3/uL — ABNORMAL HIGH (ref 4.0–10.5)

## 2018-09-06 LAB — VITAMIN B12: Vitamin B-12: 642 pg/mL (ref 180–914)

## 2018-09-06 LAB — IRON AND TIBC
Iron: 16 ug/dL — ABNORMAL LOW (ref 45–182)
Saturation Ratios: 9 % — ABNORMAL LOW (ref 17.9–39.5)
TIBC: 171 ug/dL — ABNORMAL LOW (ref 250–450)
UIBC: 155 ug/dL

## 2018-09-06 LAB — RETICULOCYTES
Immature Retic Fract: 16.5 % — ABNORMAL HIGH (ref 2.3–15.9)
RBC.: 2.77 MIL/uL — ABNORMAL LOW (ref 4.22–5.81)
Retic Count, Absolute: 73.1 10*3/uL (ref 19.0–186.0)
Retic Ct Pct: 2.6 % (ref 0.4–3.1)

## 2018-09-06 LAB — FERRITIN: Ferritin: 445 ng/mL — ABNORMAL HIGH (ref 24–336)

## 2018-09-06 MED ORDER — SODIUM CHLORIDE 0.9 % IV SOLN
3.0000 g | Freq: Three times a day (TID) | INTRAVENOUS | Status: DC
  Administered 2018-09-06 – 2018-09-07 (×3): 3 g via INTRAVENOUS
  Filled 2018-09-06 (×3): qty 3

## 2018-09-06 MED ORDER — HEPARIN SODIUM (PORCINE) 5000 UNIT/ML IJ SOLN
5000.0000 [IU] | Freq: Three times a day (TID) | INTRAMUSCULAR | Status: DC
  Administered 2018-09-06 – 2018-09-07 (×2): 5000 [IU] via SUBCUTANEOUS
  Filled 2018-09-06 (×3): qty 1
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
## 2018-09-06 NOTE — Progress Notes (Signed)
PROGRESS NOTE  Brian Ramirez  JSE:[id number] DOB: 1946/03/23 DOA: 09/04/2018 PCP: [name], MD   Brief Narrative: Brian Ramirez is a 73 y.o. male with medical history significant of hernia, prior tobacco abuse presents to ED with gradually worsening sob over the past month. Symptoms began as generalized malaise around Christmas 2019 which progressed to worsening SOB. Patient reports sob worsened to where pt was unable to ambulate even 3-5 feet, requiring 20-30 min to catch his breath. Pt is previously very active and exercises and lifts weights regularly. Since feeling SOB, pt has been unable to remain active. Pt has otherwise denied florid wt loss but reports decreased appetite and feeling dehydrated. Pt states he was a former smoker, quit 68yrs ago  ED Course: In the ED, pt underwent CTA chest with findings of two R sided lung masses, measuring 4.6cm and 2.6cm with large R effusion. Pt since underwent IR guided thoracentesis. While getting procedure, Hospitalist consulted for consideration for admission.   Hospital Course: Cytology returned without malignant cells identified. IR is consulted for tissue biopsy. Leukocytosis continues and infiltrate appears to be developing, so antibiotics started.   Assessment & Plan: Principal Problem:   Pleural effusion Active Problems:   Lung mass   Mass of right lung  Acute hypoxic respiratory failure: Due to effusion and primary lung lesions, also possible developing pneumonia. - Continue supplemental oxygen to maintain SpO2 >90%.   Left perihilar CAP:  - Start unasyn  - Sputum culture  Large right-sided pleural effusion: In setting of right lung masses, suspect malignant etiology. Exudative by light's criteria. Abundant WBCs on gram stain, no culture data yet. Cytology negative.  - Repeat CXR shows interval increase and the patient remains significantly symptomatic. Will request repeat thoracentesis, again send for cytology.   Lung  masses in RUL x2 (4.6cm, 2.6cm) as well as likely metastases on the left and right hilar, subcarinal lymph nodes seen on CT imaging. Former smoker.  - Discussed with Dr. Annamaria Boots, feel pleural target should be amenable to biopsy, have consulted IR for needle biopsy.  - Note he is a poor bronchoscopy candidate due to previous jaw fracture, limited mobility.   AKI: Uncertain baseline, but CrCl has normalized >57ml/min from Cr 1.33 on admission to 0.97.   Anemia of chronic disease: Ferritin high, TIBC low.  - Continue to monitor CBC daily. Effusion was noted to be bloody fluid.   Dehydration: Improved, taking po.  Remote tobacco use:  - Noted.  DVT prophylaxis: Lovenox Code Status: Full Family Communication: None at bedside Disposition Plan: Anticipate DC home after stabilization of respiratory status, still severely dyspneic. Starting Tx for pneumonia, repeating thoracentesis and getting biopsy.   Consultants:   Interventional radiology  Procedures:   Thoracentesis 09/04/2018  Antimicrobials:  Levaquin 1/21  Unasyn 1/23 >>   Subjective: Shortness of breath at rest is mild, severe with any exertion and worse today than yesterday. +Cough productive of mucus which he says is not new. No fevers or chills, 100F today.   Objective: Vitals:   09/06/18 0453 09/06/18 1038 09/06/18 1230 09/06/18 1246  BP: 135/77   115/80  Pulse: (!) 107   (!) 102  Resp: 18   20  Temp: 99.1 F (37.3 C)   100 F (37.8 C)  TempSrc: Oral   Oral  SpO2: 93% 94% 94% 96%  Weight:      Height:        Intake/Output Summary (Last 24 hours) at 09/06/2018 1648 Last data filed  at 09/06/2018 1248 Gross per 24 hour  Intake 1560 ml  Output 450 ml  Net 1110 ml   Filed Weights   09/04/18 1314 09/04/18 1851  Weight: 74.8 kg 70.7 kg   Gen: 73 y.o. male in no distress Pulm: Tachypneic, labored at rest, decreased breath sounds on right to mid lung zones accompanied by dullness to percussion. CV: Regular  tachycardia. No murmur, rub, or gallop. No JVD, no dependent edema. GI: Abdomen soft, non-tender, non-distended, with normoactive bowel sounds.  Ext: Warm, no deformities Skin: No new rashes, lesions or ulcers on visualized skin. Thora site c/d/i. Neuro: Alert and oriented. No focal neurological deficits. Psych: Judgement and insight appear fair. Mood euthymic & affect congruent. Behavior is appropriate.    Data Reviewed: I have personally reviewed following labs and imaging studies  CBC: Recent Labs  Lab 09/04/18 1335 09/05/18 0459 09/06/18 0628  WBC 18.6* 14.7* 12.2*  NEUTROABS 15.8*  --   --   HGB 10.3* 8.4* 7.9*  HCT 33.0* 27.1* 25.8*  MCV 89.9 91.6 93.1  PLT 457* 320 440   Basic Metabolic Panel: Recent Labs  Lab 09/04/18 1335 09/05/18 0459  NA 132* 133*  K 5.0 4.6  CL 97* 100  CO2 24 25  GLUCOSE 207* 112*  BUN 26* 21  CREATININE 1.33* 0.97  CALCIUM 8.7* 8.4*   GFR: Estimated Creatinine Clearance: 69.8 mL/min (by C-G formula based on SCr of 0.97 mg/dL). Liver Function Tests: Recent Labs  Lab 09/04/18 1335 09/05/18 0459  AST 24 28  ALT 24 24  ALKPHOS 68 57  BILITOT 1.0 0.8  PROT 7.5 6.0*  ALBUMIN 2.8* 2.2*   Cardiac Enzymes: Recent Labs  Lab 09/04/18 1334  TROPONINI <0.03   Recent Results (from the past 240 hour(s))  Blood culture (routine x 2)     Status: None (Preliminary result)   Collection Time: 09/04/18  3:01 PM  Result Value Ref Range Status   Specimen Description   Final    BLOOD RIGHT HAND Performed at [facility] [address], [place], [place] [address]    Special Requests   Final    BOTTLES DRAWN AEROBIC AND ANAEROBIC Blood Culture adequate volume Performed at [facility] [address], [place], [place] [address]    Culture   Final    NO GROWTH 2 DAYS Performed at [facility] [address], [place], [place] [address]    Report Status PENDING  Incomplete  Body fluid culture      Status: None (Preliminary result)   Collection Time: 09/04/18  4:58 PM  Result Value Ref Range Status   Specimen Description   Final    PLEURAL RIGHT Performed at [facility] [address], [place], [place] [address]    Special Requests   Final    NONE Performed at [facility], [place] [address] [place], [place] [address]    Gram Stain   Final    ABUNDANT WBC PRESENT,BOTH PMN AND MONONUCLEAR NO ORGANISMS SEEN    Culture   Final    NO GROWTH 2 DAYS Performed at [facility], [place] [address], [place],  [address]    Report Status PENDING  Incomplete  Blood culture (routine x 2)     Status: None (Preliminary result)   Collection Time: 09/04/18  7:46 PM  Result Value Ref Range Status   Specimen Description   Final    BLOOD RIGHT ANTECUBITAL  Performed at [facility], [place] [address] [place], [place] [address]    Special Requests   Final    BOTTLES DRAWN AEROBIC AND ANAEROBIC Blood Culture adequate volume Performed at [facility] [address] [place], [place] [address]    Culture   Final    NO GROWTH 2 DAYS Performed at [facility] [address], [place],  [address]    Report Status PENDING  Incomplete      Radiology Studies: Dg Chest 2 View  Result Date: 09/06/2018 CLINICAL DATA:  Shortness of breath.  Pleural effusion. EXAM: CHEST - 2 VIEW COMPARISON:  September 04, 2018 FINDINGS: The large right pleural effusion with underlying opacity may be mildly worsened in the interval. Left perihilar mild infiltrate suspected. No other change. IMPRESSION: 1. The moderate to large right pleural effusion appears larger in the interval. 2. Left perihilar opacity may represent developing infiltrate. Electronically Signed   By: Dorise Bullion III M.D   On: 09/06/2018 10:29   US Thoracentesis Asp Pleural Space W/img Guide  Result Date: 09/04/2018 INDICATION: Patient with history of  dyspnea, imaging findings of right lung mass with multiple pulmonary metastases, large right pleural effusion. Request made for diagnostic and therapeutic right thoracentesis. EXAM: ULTRASOUND GUIDED DIAGNOSTIC AND THERAPEUTIC RIGHT THORACENTESIS MEDICATIONS: None COMPLICATIONS: None immediate. PROCEDURE: An ultrasound guided thoracentesis was thoroughly discussed with the patient and questions answered. The benefits, risks, alternatives and complications were also discussed. The patient understands and wishes to proceed with the procedure. Written consent was obtained. Ultrasound was performed to localize and mark an adequate pocket of fluid in the right chest. The area was then prepped and draped in the normal sterile fashion. 1% Lidocaine was used for local anesthesia. Under ultrasound guidance a 6 Fr Safe-T-Centesis catheter was introduced. Thoracentesis was performed. The catheter was removed and a dressing applied. FINDINGS: A total of approximately 2.3 liters of dark,bloody fluid was removed. Samples were sent to the laboratory as requested by the clinical team. IMPRESSION: Successful ultrasound guided diagnostic and therapeutic right thoracentesis yielding 2.3 liters of pleural fluid. Read by: Rowe Robert, PA-C Electronically Signed   By: Aletta Edouard M.D.   On: 09/04/2018 16:46    Scheduled Meds: . enoxaparin (LOVENOX) injection  40 mg Subcutaneous Q24H  . mouth rinse  15 mL Mouth Rinse BID   Continuous Infusions:   LOS: 1 day   Time spent: 35 minutes.  Patrecia Pour, MD [facility] [email or web address] Password [facility] 09/06/2018, 4:48 PM

## 2018-09-06 NOTE — Progress Notes (Signed)
Pharmacy Antibiotic Note  Brian Ramirez is a 73 y.o. male admitted on 09/04/2018 with pneumonia.  Pharmacy has been consulted for Unasyn dosing.  He got levaquin 750 mg IV x 1  Dose 1/21. WBC 18.6>>12.2. T max 100.   Plan: Unasyn 3 gm IV q8  Height: 6\' 1"  (185.4 cm) Weight: 155 lb 13.8 oz (70.7 kg) IBW/kg (Calculated) : 79.9  Temp (24hrs), Avg:99.7 F (37.6 C), Min:99.1 F (37.3 C), Max:100 F (37.8 C)  Recent Labs  Lab 09/04/18 1335 09/04/18 1501 09/04/18 1704 09/05/18 0459 09/06/18 0628  WBC 18.6*  --   --  14.7* 12.2*  CREATININE 1.33*  --   --  0.97  --   LATICACIDVEN  --  2.1* 1.3  --   --     Estimated Creatinine Clearance: 69.8 mL/min (by C-G formula based on SCr of 0.97 mg/dL).    No Known Allergies  Antimicrobials this admission: 1/21 Lvq 750 IV x 1 1/23 Unasyn >> Dose adjustments this admission:  Microbiology results: 1/21 Bcx2>>ngtd 1/21 R pleural fluid>>ngtd  Thank you for allowing pharmacy to be a part of this patient's care.  Eudelia Bunch, Pharm.D [phone] 09/06/2018 5:07 PM

## 2018-09-07 ENCOUNTER — Inpatient Hospital Stay (HOSPITAL_COMMUNITY): Payer: Medicare Other

## 2018-09-07 ENCOUNTER — Encounter (HOSPITAL_COMMUNITY): Payer: Self-pay | Admitting: Physician Assistant

## 2018-09-07 LAB — BASIC METABOLIC PANEL
Anion gap: 10 (ref 5–15)
BUN: 15 mg/dL (ref 8–23)
CO2: 25 mmol/L (ref 22–32)
Calcium: 8.4 mg/dL — ABNORMAL LOW (ref 8.9–10.3)
Chloride: 99 mmol/L (ref 98–111)
Creatinine, Ser: 0.73 mg/dL (ref 0.61–1.24)
GFR calc Af Amer: >60 mL/min (ref >60)
GFR calc non Af Amer: >60 mL/min (ref >60)
GLUCOSE: 117 mg/dL — AB (ref 70–99)
Potassium: 4.6 mmol/L (ref 3.5–5.1)
Sodium: 134 mmol/L — ABNORMAL LOW (ref 135–145)

## 2018-09-07 LAB — CBC
HCT: 27.1 % — ABNORMAL LOW (ref 39.0–52.0)
Hemoglobin: 8.1 g/dL — ABNORMAL LOW (ref 13.0–17.0)
MCH: 28.2 pg (ref 26.0–34.0)
MCHC: 29.9 g/dL — ABNORMAL LOW (ref 30.0–36.0)
MCV: 94.4 fL (ref 80.0–100.0)
PLATELETS: 367 10*3/uL (ref 150–400)
RBC: 2.87 MIL/uL — ABNORMAL LOW (ref 4.22–5.81)
RDW: 13.2 % (ref 11.5–15.5)
WBC: 11.8 10*3/uL — ABNORMAL HIGH (ref 4.0–10.5)
nRBC: 0 % (ref 0.0–0.2)

## 2018-09-07 LAB — PROTIME-INR
INR: 1.03
Prothrombin Time: 13.4 seconds (ref 11.4–15.2)

## 2018-09-07 MED ORDER — SODIUM CHLORIDE 0.9 % IV SOLN
3.0000 g | Freq: Four times a day (QID) | INTRAVENOUS | Status: DC
  Administered 2018-09-07 – 2018-09-10 (×11): 3 g via INTRAVENOUS
  Filled 2018-09-07 (×12): qty 3

## 2018-09-07 MED ORDER — MIDAZOLAM HCL 2 MG/2ML IJ SOLN
INTRAMUSCULAR | Status: AC
  Filled 2018-09-07: qty 6

## 2018-09-07 MED ORDER — LIDOCAINE HCL (PF) 1 % IJ SOLN
INTRAMUSCULAR | Status: AC | PRN
  Administered 2018-09-07: 10 mL

## 2018-09-07 MED ORDER — FENTANYL CITRATE (PF) 100 MCG/2ML IJ SOLN
INTRAMUSCULAR | Status: AC | PRN
  Administered 2018-09-07 (×3): 50 ug via INTRAVENOUS

## 2018-09-07 MED ORDER — HEPARIN SODIUM (PORCINE) 5000 UNIT/ML IJ SOLN: 5000.0000 [IU] | Freq: Three times a day (TID) | INTRAMUSCULAR | Status: DC

## 2018-09-07 MED ORDER — FENTANYL CITRATE (PF) 100 MCG/2ML IJ SOLN
INTRAMUSCULAR | Status: AC
  Filled 2018-09-07: qty 4

## 2018-09-07 MED ORDER — MIDAZOLAM HCL 2 MG/2ML IJ SOLN
INTRAMUSCULAR | Status: AC | PRN
  Administered 2018-09-07 (×3): 1 mg via INTRAVENOUS

## 2018-09-07 NOTE — Care Management Important Message (Signed)
Important Message  Patient Details  Name: Brian Ramirez MRN: [id number] Date of Birth: 08-19-1945   Medicare Important Message Given:  Yes    Kerin Salen 09/07/2018, 11:47 AMImportant Message  Patient Details  Name: Brian Ramirez MRN: [id number] Date of Birth: 1946-05-17   Medicare Important Message Given:  Yes    Kerin Salen 09/07/2018, 11:47 AM

## 2018-09-07 NOTE — Care Management Note (Signed)
Case Management Note  Patient Details  Name: Brian Ramirez MRN: [id number] Date of Birth: [birth year]  Subjective/Objective:  Pleural Effusion. From home. PT recc HHPT,aide-spoke to [facility] 1st rep-Cory agreed to [facility] 1st program-patient also in agreement. Patient doesn't want to use a rw @ d/c-he will explore that once @ home.                   Action/Plan:dc home w/[facility] 1st program-HHPT,aide.   Expected Discharge Date:  (unknown)               Expected Discharge Plan:  [place]  In-House Referral:     Discharge planning Services  CM Consult  Post Acute Care Choice:  Durable Medical Equipment(cane) Choice offered to:  Patient  DME Arranged:    DME Agency:     HH Arranged:  PT, Nurse's Aide [place] Agency:  [facility]  Status of Service:  In process, will continue to follow  If discussed at Long Length of Stay Meetings, dates discussed:    Additional Comments:  Dessa Phi, RN 09/07/2018, 11:44 AM
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
## 2018-09-07 NOTE — Progress Notes (Signed)
PROGRESS NOTE  Brian Ramirez  QIH:[id number] DOB: [birth year] DOA: 09/04/2018 PCP: [name], MD   Brief Narrative: Brian Ramirez is a 73 y.o. male with medical history significant of hernia, prior tobacco abuse presents to ED with gradually worsening sob over the past month. Symptoms began as generalized malaise around Christmas 2019 which progressed to worsening SOB. Patient reports sob worsened to where pt was unable to ambulate even 3-5 feet, requiring 20-30 min to catch his breath. Pt is previously very active and exercises and lifts weights regularly. Since feeling SOB, pt has been unable to remain active. Pt has otherwise denied florid wt loss but reports decreased appetite and feeling dehydrated. Pt states he was a former smoker, quit 2yrs ago  ED Course: In the ED, pt underwent CTA chest with findings of two R sided lung masses, measuring 4.6cm and 2.6cm with large R effusion. Pt since underwent IR guided thoracentesis. While getting procedure, Hospitalist consulted for consideration for admission.   Hospital Course: Cytology returned without malignant cells identified. IR is consulted for tissue biopsy. Leukocytosis continues and infiltrate appears to be developing, so antibiotics started.   Assessment & Plan: Principal Problem:   Pleural effusion Active Problems:   Lung mass   Mass of right lung  Acute hypoxic respiratory failure: Due to effusion and primary lung lesions, also possible developing pneumonia. - Continue supplemental oxygen to maintain SpO2 >90%.  - Continues to worsen with rapid reaccumulation of effusion, requesting repeat thoracentesis as below.  Left perihilar CAP:  - Started unasyn, mucus/sputum production increased from baseline - Sputum culture ordered. D/w RN and pt.  Large right-sided pleural effusion: In setting of right lung masses, suspect malignant etiology. Exudative by light's criteria. Abundant WBCs on gram stain, no culture data yet.  Cytology negative.  - Repeat CXR shows interval increase and the patient remains significantly symptomatic. Will request repeat thoracentesis, again send for cytology.   Lung masses in RUL x2 (4.6cm, 2.6cm) as well as likely metastases on the left and right hilar, subcarinal lymph nodes and pleura seen on CT imaging. Former smoker.  - Discussed with Dr. Kathlene Cote today who will attempt CT-guided needle biopsy today. - Note, he is a poor bronchoscopy candidate due to previous jaw fracture, limited mobility. - Plan to discuss with oncology following tissue diagnosis.   AKI: Uncertain baseline, but CrCl has normalized >25ml/min from Cr 1.33 on admission to 0.97.   Anemia of chronic disease: Ferritin high, TIBC low. hgb stable - Continue to monitor CBC daily. Effusion was noted to be bloody fluid.   Dehydration: Improved, taking po.  Remote tobacco use:  - Noted.  Right paraspinal lumbar muscle strain: Due to laying in bed most likely. No red flag features.  - Trial tylenol/NSAID, heating pad.   DVT prophylaxis: Heparin with need for procedures. Code Status: Full Family Communication: None at bedside Disposition Plan: Anticipate DC home after stabilization of respiratory status. Remains severely dyspneic. Also obtaining expedited workup/diagnosis or suspected pulmonary malignancy.  Consultants:   Interventional radiology  Procedures:   Thoracentesis 09/04/2018  CT-guided needle biopsy 09/07/2018  Antimicrobials:  Levaquin 1/21  Unasyn 1/23 >>   Subjective: Dyspnea is severe, limiting his ability to work with PT. No chest pain. Having some moderate right lower back pain developing gradually over the past 12 hours worse with some positions. No abd pain, radiation, dysuria, hematuria. No medications tried.  Objective: Vitals:   09/06/18 2043 09/07/18 0441 09/07/18 1242 09/07/18 1530  BP: 129/77 136/78  113/74 125/83  Pulse: (!) 105 (!) 109 94 (!) 102  Resp: 20 20 18  (!) 21    Temp: 99 F (37.2 C) 99.9 F (37.7 C) 98.4 F (36.9 C)   TempSrc: Oral Oral Oral   SpO2: 93% 95% 97% 98%  Weight:      Height:        Intake/Output Summary (Last 24 hours) at 09/07/2018 1545 Last data filed at 09/07/2018 0900 Gross per 24 hour  Intake 240 ml  Output 1300 ml  Net -1060 ml   Filed Weights   09/04/18 1314 09/04/18 1851  Weight: 74.8 kg 70.7 kg   Gen: 73 y.o. male in no distress Pulm: Labored tachypnea with significantly decreased lung sounds and dullness to percussion R > L.  CV: Regular tachycardia. No murmur, rub, or gallop. No JVD, no dependent edema. GI: Abdomen soft, non-tender, non-distended, with normoactive bowel sounds.  MSK: Mild tenderness to palpation on right lumbar paraspinal musculature, no CVA tenderness or midline tenderness or palpable deformity. Skin: No new rashes, lesions or ulcers on visualized skin. Neuro: Alert and oriented. No focal neurological deficits. Psych: Judgement and insight appear fair. Mood euthymic & affect congruent. Behavior is appropriate.    Data Reviewed: I have personally reviewed following labs and imaging studies  CBC: Recent Labs  Lab 09/04/18 1335 09/05/18 0459 09/06/18 0628 09/07/18 0540  WBC 18.6* 14.7* 12.2* 11.8*  NEUTROABS 15.8*  --   --   --   HGB 10.3* 8.4* 7.9* 8.1*  HCT 33.0* 27.1* 25.8* 27.1*  MCV 89.9 91.6 93.1 94.4  PLT 457* 320 309 768   Basic Metabolic Panel: Recent Labs  Lab 09/04/18 1335 09/05/18 0459 09/07/18 0540  NA 132* 133* 134*  K 5.0 4.6 4.6  CL 97* 100 99  CO2 24 25 25   GLUCOSE 207* 112* 117*  BUN 26* 21 15  CREATININE 1.33* 0.97 0.73  CALCIUM 8.7* 8.4* 8.4*   GFR: Estimated Creatinine Clearance: 84.7 mL/min (by C-G formula based on SCr of 0.73 mg/dL). Liver Function Tests: Recent Labs  Lab 09/04/18 1335 09/05/18 0459  AST 24 28  ALT 24 24  ALKPHOS 68 57  BILITOT 1.0 0.8  PROT 7.5 6.0*  ALBUMIN 2.8* 2.2*   Cardiac Enzymes: Recent Labs  Lab 09/04/18 1334   TROPONINI <0.03   Recent Results (from the past 240 hour(s))  Blood culture (routine x 2)     Status: None (Preliminary result)   Collection Time: 09/04/18  3:01 PM  Result Value Ref Range Status   Specimen Description   Final    BLOOD RIGHT HAND Performed at [facility] [address], [place], [place] [address]    Special Requests   Final    BOTTLES DRAWN AEROBIC AND ANAEROBIC Blood Culture adequate volume Performed at [facility] [address] [place], [place] [address]    Culture   Final    NO GROWTH 3 DAYS Performed at [facility], [place] [address], [place], [place] [address]    Report Status PENDING  Incomplete  Body fluid culture     Status: None (Preliminary result)   Collection Time: 09/04/18  4:58 PM  Result Value Ref Range Status   Specimen Description   Final    PLEURAL RIGHT Performed at [facility] [address] [place],  [address]    Special Requests   Final    NONE Performed at [facility], [place]  [address], [place], [place] [address]    Gram Stain   Final    ABUNDANT WBC PRESENT,BOTH PMN AND MONONUCLEAR NO ORGANISMS SEEN    Culture   Final    NO GROWTH 3 DAYS Performed at [facility], [place] [address] [place], [place] [address]    Report Status PENDING  Incomplete  Blood culture (routine x 2)     Status: None (Preliminary result)   Collection Time: 09/04/18  7:46 PM  Result Value Ref Range Status   Specimen Description   Final    BLOOD RIGHT ANTECUBITAL Performed at [facility] [address], [place], [place] [address]    Special Requests   Final    BOTTLES DRAWN AEROBIC AND ANAEROBIC Blood Culture adequate volume Performed at [facility] [address], [place], [place] [address]    Culture   Final    NO GROWTH 3 DAYS Performed at [facility], [place] [address] [place], [place] [address]     Report Status PENDING  Incomplete      Radiology Studies: Dg Chest 2 View  Result Date: 09/06/2018 CLINICAL DATA:  Shortness of breath.  Pleural effusion. EXAM: CHEST - 2 VIEW COMPARISON:  September 04, 2018 FINDINGS: The large right pleural effusion with underlying opacity may be mildly worsened in the interval. Left perihilar mild infiltrate suspected. No other change. IMPRESSION: 1. The moderate to large right pleural effusion appears larger in the interval. 2. Left perihilar opacity may represent developing infiltrate. Electronically Signed   By: Dorise Bullion III M.D   On: 09/06/2018 10:29    Scheduled Meds: . fentaNYL      . [START ON 09/08/2018] heparin injection (subcutaneous)  5,000 Units Subcutaneous Q8H  . mouth rinse  15 mL Mouth Rinse BID  . midazolam       Continuous Infusions: . ampicillin-sulbactam (UNASYN) IV       LOS: 2 days   Time spent: 35 minutes.  Patrecia Pour, MD [facility] [email or web address] Password [facility] 09/07/2018, 3:45 PM

## 2018-09-07 NOTE — Plan of Care (Signed)
Pt weaned down to 1LPM w/ SAT's in low 90's.

## 2018-09-07 NOTE — Progress Notes (Signed)
Pharmacy Antibiotic Note  Brian Ramirez is a 73 y.o. male presented to the ED 09/04/2018 complaining of dyspnea with minimal exertion for several weeks. Chest CTa on 1/21 was negative for PE, but showed 2 right lung masses and large right pleural effusion. Patient underwent thoracentesis on 1/21 w/ fluid sent for culture analysis. Of note he received levaquin 750 mg IV x 1 dose in the ED on 1/21. Unasyn initiated on 1/23 for suspected pneumonia. Patient scheduled for possible thoracentesis and a CT biopsy on 1/24.  Today, 09/07/18 - WBC 12.2>>11.8 - Temp 99.6 - Scr improved to 0.73 (1.33 on admission), CrCl 84.7 ml/min  Plan: - Will adjust Unasyn to 3 gm IV q6h for renal function and pneumonia indication - F/u with clinical status and cultures  Height: 6\' 1"  (185.4 cm) Weight: 155 lb 13.8 oz (70.7 kg) IBW/kg (Calculated) : 79.9  Temp (24hrs), Avg:99.6 F (37.6 C), Min:99 F (37.2 C), Max:100 F (37.8 C)  Recent Labs  Lab 09/04/18 1335 09/04/18 1501 09/04/18 1704 09/05/18 0459 09/06/18 0628 09/07/18 0540  WBC 18.6*  --   --  14.7* 12.2* 11.8*  CREATININE 1.33*  --   --  0.97  --  0.73  LATICACIDVEN  --  2.1* 1.3  --   --   --     Estimated Creatinine Clearance: 84.7 mL/min (by C-G formula based on SCr of 0.73 mg/dL).    No Known Allergies  Antimicrobials this admission: 1/21 Lvq 750 IV x 1 1/23 Unasyn >> Dose adjustments this admission: Unasyn 3 g q8h>>Unasyn 3 g q6h Microbiology results: 1/21 Bcx2>>ngtd 1/21 R pleural fluid>>ngtd 1/23 Sputum cx ordered  Thank you for allowing pharmacy to be a part of this patient's care.  Jonette Eva PharmD Candidate 09/07/2018 10:26 AM

## 2018-09-07 NOTE — H&P (Signed)
Chief Complaint: Right lung mass  Referring Physician(s): Patrecia Pour  Supervising Physician: Aletta Edouard  Patient Status: [facility] - In-pt  History of Present Illness: Brian Ramirez is a 73 y.o. male who presented to the ED with gradually worsening shortness of breath over the past month.   Symptoms began as generalized malaise around Christmas 2019.  He states he was so short of breath that he was unable to ambulate even 3-5 feet and it requiring 20-30 minutes for him to catch his breath.  He also reports decreased appetite and feeling dehydrated.   Pt states he was a former smoker, quit 86yrs ago.  CT scan done 09/04/2018 showed = No evidence of pulmonary embolism.  A 4.6 cm mass in the anterior right upper lung, suspicious for primary bronchogenic neoplasm versus pleural-based metastasis. Additional 2.6 cm nodule centrally in the right upper lobe, suspicious for primary bronchogenic neoplasm versus metastasis (related to the anterior mass).  Large right pleural effusion with pleural-based metastases.  Multiple pulmonary metastases in the left hemithorax.  Possible small subcarinal and right hilar nodal metastases, indeterminate.  He underwent thoracentesis by our service on 1/21 but pleural fluid was negative for malignancy.  We are asked to perform an image guided biopsy.  No nausea/vomiting. No Fever/chills. Otherwise ROS negative.  He is NPO. Last dose of SQ heparin was 6 am.  Past Medical History:  Diagnosis Date  . Hernia 2012  . History of hiatal hernia   . History of kidney stones   . Personal history of kidney stones 1992    Past Surgical History:  Procedure Laterality Date  . HERNIA REPAIR  2012   inguinal  . INGUINAL HERNIA REPAIR Left 11/22/2016   Procedure: LAPAROSCOPIC  REPAIR OF LEFT INGUINAL HERNIA WITH MESH;  Surgeon: [name], MD;  Location: [facility];  Service: General;  Laterality: Left;    Allergies: Patient has no  known allergies.  Medications: Prior to Admission medications   Medication Sig Start Date End Date Taking? Authorizing Provider  aspirin 81 MG tablet Take 81 mg by mouth daily.   Yes [provider]  benzonatate (TESSALON) 100 MG capsule Take 100 mg by mouth every 8 (eight) hours as needed for cough.   Yes [provider]  ibuprofen (ADVIL,MOTRIN) 200 MG tablet Take 200 mg by mouth daily as needed for headache or moderate pain.   Yes [provider]  Multiple Vitamin (MULTIVITAMIN WITH MINERALS) TABS tablet Take 1 tablet by mouth daily.   Yes [provider]  traMADol (ULTRAM) 50 MG tablet Take 1-2 tablets (50-100 mg total) by mouth every 6 (six) hours as needed for moderate pain or severe pain. Patient not taking: Reported on 09/04/2018 11/22/16   [name], MD     History reviewed. No pertinent family history.  Social History   Socioeconomic History  . Marital status: Single    Spouse name: Not on file  . Number of children: Not on file  . Years of education: Not on file  . Highest education level: Not on file  Occupational History  . Not on file  Social Needs  . Financial resource strain: Not on file  . Food insecurity:    Worry: Not on file    Inability: Not on file  . Transportation needs:    Medical: Not on file    Non-medical: Not on file  Tobacco Use  . Smoking status: Never Smoker  . Smokeless tobacco: Never Used  Substance and Sexual Activity  . Alcohol use: Yes    Alcohol/week: 1.0 standard drinks    Types: 1 Glasses of wine per week  . Drug use: No  . Sexual activity: Not on file  Lifestyle  . Physical activity:    Days per week: Not on file    Minutes per session: Not on file  . Stress: Not on file  Relationships  . Social connections:    Talks on phone: Not on file    Gets together: Not on file    Attends religious service: Not on file    Active member of club or organization: Not on file    Attends meetings of  clubs or organizations: Not on file    Relationship status: Not on file  Other Topics Concern  . Not on file  Social History Narrative  . Not on file     Review of Systems: A 12 point ROS discussed and pertinent positives are indicated in the HPI above.  All other systems are negative.  Review of Systems  Vital Signs: BP 113/74 (BP Location: Right Arm)   Pulse 94   Temp 98.4 F (36.9 C) (Oral)   Resp 18   Ht 6\' 1"  (1.854 m)   Wt 70.7 kg   SpO2 97%   BMI 20.56 kg/m   Physical Exam Vitals signs reviewed.  Constitutional:      Appearance: He is well-developed.  HENT:     Head: Normocephalic and atraumatic.  Cardiovascular:     Rate and Rhythm: Normal rate and regular rhythm.  Pulmonary:     Effort: Pulmonary effort is normal.     Comments: Breath sounds diminished on the right. Abdominal:     General: There is no distension.     Palpations: Abdomen is soft.     Tenderness: There is no abdominal tenderness.  Musculoskeletal: Normal range of motion.  Skin:    General: Skin is warm and dry.  Neurological:     General: No focal deficit present.     Mental Status: He is alert and oriented to person, place, and time.  Psychiatric:        Mood and Affect: Mood normal.        Behavior: Behavior normal.        Thought Content: Thought content normal.        Judgment: Judgment normal.     Imaging: Dg Chest 1 View  Result Date: 09/04/2018 CLINICAL DATA:  Status post thoracentesis EXAM: CHEST  1 VIEW COMPARISON:  Chest radiograph 09/04/2017 FINDINGS: Decreased size of right pleural effusion, now occupying approximately 1/3 the volume of the right hemithorax previously 2/3. No pneumothorax. Nodular opacity in the left mid lung corresponds to findings on recent chest CT. Cardiomediastinal size is normal. IMPRESSION: Decreased size of right pleural effusion. No pneumothorax. Electronically Signed   By: Ulyses Jarred M.D.   On: 09/04/2018 16:46   Dg Chest 2 View  Result  Date: 09/06/2018 CLINICAL DATA:  Shortness of breath.  Pleural effusion. EXAM: CHEST - 2 VIEW COMPARISON:  September 04, 2018 FINDINGS: The large right pleural effusion with underlying opacity may be mildly worsened in the interval. Left perihilar mild infiltrate suspected. No other change. IMPRESSION: 1. The moderate to large right pleural effusion appears larger in the interval. 2. Left perihilar opacity may represent developing infiltrate. Electronically Signed   By: Dorise Bullion III M.D   On: 09/06/2018 10:29   Ct Angio Chest Pe W And/or  Wo Contrast  Result Date: 09/04/2018 CLINICAL DATA:  Pleural effusion and pulmonary nodules (suspicious for neoplasm) on chest radiograph. Shortness of breath. Evaluate for PE. EXAM: CT ANGIOGRAPHY CHEST WITH CONTRAST TECHNIQUE: Multidetector CT imaging of the chest was performed using the standard protocol during bolus administration of intravenous contrast. Multiplanar CT image reconstructions and MIPs were obtained to evaluate the vascular anatomy. CONTRAST:  66mL ISOVUE-370 IOPAMIDOL (ISOVUE-370) INJECTION 76% COMPARISON:  Chest radiograph dated 09/04/2018 FINDINGS: Cardiovascular: Satisfactory opacification of the bilateral pulmonary arteries to the lobar level. Segmental/segmental pulmonary arteries are limited due to motion degradation. No evidence of pulmonary embolism. No evidence of thoracic aortic aneurysm or dissection. Atherosclerotic calcifications of the aortic arch. Mild coronary atherosclerosis of the LAD. The heart is normal in size.  No pericardial effusion. Mediastinum/Nodes: 12 mm right hilar node (series 4/image 49). 9 mm subcarinal node (series 4/image 52). Visualized thyroid is unremarkable. Lungs/Pleura: Evaluation lung parenchyma is constrained by respiratory motion. 2.7 x 4.6 cm mass in the anterior right upper lung (series 4/image 29), suspicious for primary bronchogenic neoplasm. Additional 1.8 x 2.6 cm nodule centrally in the right upper lobe  (series 4/image 47), suspicious. Large right pleural effusion with pleural-based nodularity/metastases (for example, series 4/image 104). Associated compressive atelectasis of the right middle and lower lobes. Multiple pulmonary nodules in the left upper and lower lobes, lower lobe predominant, poorly evaluated due to motion degradation. Index nodule measures 14 mm in the central left lower lobe (series 10/image 28). These are compatible with metastases. Additional mild patchy opacity in the lingula (series 10/image 98). No pneumothorax. Upper Abdomen: Visualized upper abdomen is motion degraded but grossly unremarkable, noting aberrant perirenal vasculature along the right upper kidney (series 4/image 127). Musculoskeletal: Degenerative changes of the visualized thoracolumbar spine. Review of the MIP images confirms the above findings. IMPRESSION: No evidence of pulmonary embolism. 4.6 cm mass in the anterior right upper lung, suspicious for primary bronchogenic neoplasm versus pleural-based metastasis. Additional 2.6 cm nodule centrally in the right upper lobe, suspicious for primary bronchogenic neoplasm versus metastasis (related to the anterior mass). Large right pleural effusion with pleural-based metastases. Multiple pulmonary metastases in the left hemithorax. Possible small subcarinal and right hilar nodal metastases, indeterminate. Aortic Atherosclerosis (ICD10-I70.0). Electronically Signed   By: Julian Hy M.D.   On: 09/04/2018 14:53   Dg Chest Port 1 View  Result Date: 09/04/2018 CLINICAL DATA:  Shortness of Breath EXAM: PORTABLE CHEST 1 VIEW COMPARISON:  None. FINDINGS: Cardiac shadow is stable. Nodular changes are noted in the left lung base suspicious for metastatic disease. Large right-sided pleural effusion with underlying atelectasis is noted. Bony structures are within normal limits. IMPRESSION: Large right-sided pleural effusion with underlying atelectatic changes. Multiple nodular  densities are noted over the left base suspicious for neoplastic involvement. CT is recommended for further evaluation. Electronically Signed   By: Inez Catalina M.D.   On: 09/04/2018 14:33   US Thoracentesis Asp Pleural Space W/img Guide  Result Date: 09/04/2018 INDICATION: Patient with history of dyspnea, imaging findings of right lung mass with multiple pulmonary metastases, large right pleural effusion. Request made for diagnostic and therapeutic right thoracentesis. EXAM: ULTRASOUND GUIDED DIAGNOSTIC AND THERAPEUTIC RIGHT THORACENTESIS MEDICATIONS: None COMPLICATIONS: None immediate. PROCEDURE: An ultrasound guided thoracentesis was thoroughly discussed with the patient and questions answered. The benefits, risks, alternatives and complications were also discussed. The patient understands and wishes to proceed with the procedure. Written consent was obtained. Ultrasound was performed to localize and mark an adequate pocket of  fluid in the right chest. The area was then prepped and draped in the normal sterile fashion. 1% Lidocaine was used for local anesthesia. Under ultrasound guidance a 6 Fr Safe-T-Centesis catheter was introduced. Thoracentesis was performed. The catheter was removed and a dressing applied. FINDINGS: A total of approximately 2.3 liters of dark,bloody fluid was removed. Samples were sent to the laboratory as requested by the clinical team. IMPRESSION: Successful ultrasound guided diagnostic and therapeutic right thoracentesis yielding 2.3 liters of pleural fluid. Read by: Rowe Robert, PA-C Electronically Signed   By: Aletta Edouard M.D.   On: 09/04/2018 16:46    Labs:  CBC: Recent Labs    09/04/18 1335 09/05/18 0459 09/06/18 0628 09/07/18 0540  WBC 18.6* 14.7* 12.2* 11.8*  HGB 10.3* 8.4* 7.9* 8.1*  HCT 33.0* 27.1* 25.8* 27.1*  PLT 457* 320 309 367    COAGS: Recent Labs    09/07/18 0540  INR 1.03    BMP: Recent Labs    09/04/18 1335 09/05/18 0459  09/07/18 0540  NA 132* 133* 134*  K 5.0 4.6 4.6  CL 97* 100 99  CO2 24 25 25   GLUCOSE 207* 112* 117*  BUN 26* 21 15  CALCIUM 8.7* 8.4* 8.4*  CREATININE 1.33* 0.97 0.73  GFRNONAA 53* >60 >60  GFRAA >60 >60 >60    LIVER FUNCTION TESTS: Recent Labs    09/04/18 1335 09/05/18 0459  BILITOT 1.0 0.8  AST 24 28  ALT 24 24  ALKPHOS 68 57  PROT 7.5 6.0*  ALBUMIN 2.8* 2.2*    TUMOR MARKERS: No results for input(s): AFPTM, CEA, CA199, CHROMGRNA in the last 8760 hours.  Assessment and Plan:  4.6 cm mass in the anterior right upper lung, suspicious for primary bronchogenic neoplasm versus pleural-based metastasis. Additional 2.6 cm nodule centrally in the right upper lobe, suspicious for primary bronchogenic neoplasm versus metastasis (related to the anterior mass).  Large right pleural effusion with pleural-based metastases.  Multiple pulmonary metastases in the left hemithorax.  Possible small subcarinal and right hilar nodal metastases, indeterminate.  Will proceed with image guided biopsy and thoracentesis today by Dr. Kathlene Cote.  Risks and benefits of thoracentesis were discussed with the patient including, but not limited to bleeding, infection, pneumothorax, and that fact that all the fluid may not be removed during today's procedure.  Risks and benefits discussed with the patient including, but not limited to bleeding, infection, damage to adjacent structures or low yield requiring additional tests.  All of the patient's questions were answered, patient is agreeable to proceed. Consent signed and in chart.  Thank you for this interesting consult.  I greatly enjoyed meeting JAHSON EMANUELE and look forward to participating in their care.  A copy of this report was sent to the requesting provider on this date.  Electronically Signed: Murrell Redden, PA-C   09/07/2018, 1:28 PM      I spent a total of  40 Minutes in face to face in clinical consultation, greater  than 50% of which was counseling/coordinating care for lung biopsy and thoracentesis.

## 2018-09-07 NOTE — Procedures (Addendum)
Interventional Radiology Procedure Note  Procedure: CT guided thoracentesis and right pleural mass biopsy  Complications: None  Estimated Blood Loss: < 10 mL  Findings: 18 G core biopsy x 4 of 2 cm right lateral pleural mass via 17 G needle.  Thoracentesis via 6 Fr Safe-T-Centesis catheter with removal of 3 L of grossly bloody fluid. CT shows no PTX. Some residual fluid.  Venetia Night. Kathlene Cote, M.D Pager:  [phone]

## 2018-09-08 LAB — CBC
HCT: 25.1 % — ABNORMAL LOW (ref 39.0–52.0)
Hemoglobin: 7.7 g/dL — ABNORMAL LOW (ref 13.0–17.0)
MCH: 27.6 pg (ref 26.0–34.0)
MCHC: 30.7 g/dL (ref 30.0–36.0)
MCV: 90 fL (ref 80.0–100.0)
NRBC: 0 % (ref 0.0–0.2)
Platelets: 441 10*3/uL — ABNORMAL HIGH (ref 150–400)
RBC: 2.79 MIL/uL — AB (ref 4.22–5.81)
RDW: 13.4 % (ref 11.5–15.5)
WBC: 12.8 10*3/uL — ABNORMAL HIGH (ref 4.0–10.5)

## 2018-09-08 LAB — BODY FLUID CULTURE: Culture: NO GROWTH

## 2018-09-08 MED ORDER — ONDANSETRON 4 MG PO TBDP: 4.0000 mg | ORAL_TABLET | Freq: Three times a day (TID) | ORAL | Status: DC | PRN

## 2018-09-08 MED ORDER — ONDANSETRON HCL 4 MG/2ML IJ SOLN
4.0000 mg | Freq: Three times a day (TID) | INTRAMUSCULAR | Status: DC | PRN
  Administered 2018-09-08: 4 mg via INTRAVENOUS
  Filled 2018-09-08: qty 2

## 2018-09-08 NOTE — Progress Notes (Signed)
PROGRESS NOTE  OSHA ERRICO  GYI:[id number] DOB: 03-26-1946 DOA: 09/04/2018 PCP: [name], MD   Brief Narrative: Brian Ramirez is a 73 y.o. male with medical history significant of hernia, prior tobacco abuse presents to ED with gradually worsening sob over the past month. Symptoms began as generalized malaise around Christmas 2019 which progressed to worsening SOB. Patient reports sob worsened to where pt was unable to ambulate even 3-5 feet, requiring 20-30 min to catch his breath. Pt is previously very active and exercises and lifts weights regularly. Since feeling SOB, pt has been unable to remain active. Pt has otherwise denied florid wt loss but reports decreased appetite and feeling dehydrated. Pt states he was a former smoker, quit 40yrs ago  ED Course: In the ED, pt underwent CTA chest with findings of two R sided lung masses, measuring 4.6cm and 2.6cm with large R effusion. Pt since underwent IR guided thoracentesis. While getting procedure, Hospitalist consulted for consideration for admission.   Hospital Course: Cytology returned without malignant cells identified. IR is consulted for tissue biopsy. Leukocytosis continues and infiltrate appears to be developing, so antibiotics started.   Assessment & Plan: Principal Problem:   Pleural effusion Active Problems:   Lung mass   Mass of right lung  Acute hypoxic respiratory failure: Due to effusion and primary lung lesions, also possible developing pneumonia. - Continue supplemental oxygen to maintain SpO2 >90%.   Left perihilar CAP:  - Started unasyn, mucus/sputum production increased from baseline - Sputum culture ordered.    Large right-sided pleural effusion: In setting of right lung masses, suspect malignant etiology. Exudative by light's criteria, grossly bloody. Abundant WBCs on gram stain, no culture positivity. Cytology negative.  - Monitor for accumulation. Removed 3L on 1/24 which accumulated from 1/21. May  need pleurx  Lung masses in RUL x2 (4.6cm, 2.6cm) as well as likely metastases on the left and right hilar, subcarinal lymph nodes and pleura seen on CT imaging. Former smoker.  - s/p CT-guided pleural mass biopsy 1/24, awaiting pathology report (not likely until 1/27) before discussing with oncology. - Note, he is a poor bronchoscopy candidate due to previous jaw fracture, limited mobility.  AKI: Uncertain baseline, but CrCl has normalized >44ml/min from Cr 1.33 on admission to 0.97.   Anemia of chronic disease and mild acute blood loss anemia: Ferritin high, TIBC low.  - Continue to monitor CBC daily. Effusion was noted to be bloody fluid. Transfusion threshold 7g/dl or symptoms attributable to anemia.  Dehydration: Improved, taking po.  Remote tobacco use:  - Noted.  Right paraspinal lumbar muscle strain: Due to laying in bed most likely. No red flag features.  - Trial tylenol/NSAID, heating pad.   DVT prophylaxis: Due to bloody pleural effusion and dropping hgb, recommended SCDs which the patient has declined. Will stop pharmacologic DVT ppx. Pt knows to ambulate regularly. Code Status: Full Family Communication: None at bedside Disposition Plan: Anticipate DC home after stabilization of respiratory status. Remains severely dyspneic, and may need pleurx catheter placement. Also obtaining expedited workup/diagnosis or suspected pulmonary malignancy.  Consultants:   Interventional radiology  Procedures:   Thoracentesis 09/04/2018, 09/07/2018  CT-guided needle biopsy 09/07/2018  Antimicrobials:  Levaquin 1/21  Unasyn 1/23 >>   Subjective: Having ongoing cough, no fevers. Dyspnea is increased from baseline but improved since thoracentesis yesterday. No chest pain. No other bleeding.  Objective: Vitals:   09/07/18 1805 09/07/18 2351 09/08/18 0434 09/08/18 1509  BP: 111/69 113/65 119/72 120/64  Pulse: 88 (!)  101 (!) 102 92  Resp: 20 16 (!) 24 20  Temp:  99.6 F (37.6 C)  99.8 F (37.7 C) 99.2 F (37.3 C)  TempSrc:  Oral Oral Oral  SpO2: 94% 95% 95% 95%  Weight:      Height:        Intake/Output Summary (Last 24 hours) at 09/08/2018 1702 Last data filed at 09/08/2018 1500 Gross per 24 hour  Intake 2049.23 ml  Output 800 ml  Net 1249.23 ml   Filed Weights   09/04/18 1314 09/04/18 1851  Weight: 74.8 kg 70.7 kg   Gen: 73 y.o. male in no distress Pulm: Nonlabored tachypnea with supplemental oxygen. Decreased at bases R > L but lower than previously. CV: Regular borderline tachycardia. No murmur, rub, or gallop. No JVD, no dependent edema. GI: Abdomen soft, non-tender, non-distended, with normoactive bowel sounds.  Ext: Warm, no deformities Skin: No rashes, lesions or ulcers on visualized skin. Puncture sites on Right lateral chest wall c/d/i. Neuro: Alert and oriented. No focal neurological deficits. Psych: Judgement and insight appear fair. Mood euthymic & affect congruent. Behavior is appropriate.    Data Reviewed: I have personally reviewed following labs and imaging studies  CBC: Recent Labs  Lab 09/04/18 1335 09/05/18 0459 09/06/18 0628 09/07/18 0540 09/08/18 0541  WBC 18.6* 14.7* 12.2* 11.8* 12.8*  NEUTROABS 15.8*  --   --   --   --   HGB 10.3* 8.4* 7.9* 8.1* 7.7*  HCT 33.0* 27.1* 25.8* 27.1* 25.1*  MCV 89.9 91.6 93.1 94.4 90.0  PLT 457* 320 309 367 476*   Basic Metabolic Panel: Recent Labs  Lab 09/04/18 1335 09/05/18 0459 09/07/18 0540  NA 132* 133* 134*  K 5.0 4.6 4.6  CL 97* 100 99  CO2 24 25 25   GLUCOSE 207* 112* 117*  BUN 26* 21 15  CREATININE 1.33* 0.97 0.73  CALCIUM 8.7* 8.4* 8.4*   GFR: Estimated Creatinine Clearance: 84.7 mL/min (by C-G formula based on SCr of 0.73 mg/dL). Liver Function Tests: Recent Labs  Lab 09/04/18 1335 09/05/18 0459  AST 24 28  ALT 24 24  ALKPHOS 68 57  BILITOT 1.0 0.8  PROT 7.5 6.0*  ALBUMIN 2.8* 2.2*   Cardiac Enzymes: Recent Labs  Lab 09/04/18 1334  TROPONINI <0.03    Recent Results (from the past 240 hour(s))  Blood culture (routine x 2)     Status: None (Preliminary result)   Collection Time: 09/04/18  3:01 PM  Result Value Ref Range Status   Specimen Description   Final    BLOOD RIGHT HAND Performed at [facility] [address] [place], [place] [address]    Special Requests   Final    BOTTLES DRAWN AEROBIC AND ANAEROBIC Blood Culture adequate volume Performed at [facility] [address] [place], [place] [address]    Culture   Final    NO GROWTH 4 DAYS Performed at [facility], [place] [address], [place], [place] [address]    Report Status PENDING  Incomplete  Body fluid culture     Status: None   Collection Time: 09/04/18  4:58 PM  Result Value Ref Range Status   Specimen Description   Final    PLEURAL RIGHT Performed at [facility] [address] [place], [place] [address]    Special Requests   Final    NONE Performed at [facility], [place] [address], [place], [place] [address]  Gram Stain   Final    ABUNDANT WBC PRESENT,BOTH PMN AND MONONUCLEAR NO ORGANISMS SEEN    Culture   Final    NO GROWTH 3 DAYS Performed at [facility], [place] [address] [place], [place] [address]    Report Status 09/08/2018 FINAL  Final  Blood culture (routine x 2)     Status: None (Preliminary result)   Collection Time: 09/04/18  7:46 PM  Result Value Ref Range Status   Specimen Description   Final    BLOOD RIGHT ANTECUBITAL Performed at [facility] [address] [place], [place] [address]    Special Requests   Final    BOTTLES DRAWN AEROBIC AND ANAEROBIC Blood Culture adequate volume Performed at [facility] [address] [place], [place] [address]    Culture   Final    NO GROWTH 4 DAYS Performed at [facility], [place] [address] [place], [place] [address]    Report Status PENDING  Incomplete       Radiology Studies: Ct Aspiration  Result Date: 09/08/2018 CLINICAL DATA:  Large right bloody pleural effusion and multiple pleural masses. Negative cytology on prior fluid withdrawn by thoracentesis on 09/04/2018. Significant reaccumulation pleural fluid since that time by chest x-ray. Request for biopsy of pleural mass for tissue diagnosis. EXAM: 1. CT-GUIDED RIGHT THORACENTESIS 2. CT GUIDED CORE BIOPSY OF RIGHT PLEURAL MASS ANESTHESIA/SEDATION: 2.0 mg IV Versed; 100 mcg IV Fentanyl Total Moderate Sedation Time:  23 minutes. The patient's level of consciousness and physiologic status were continuously monitored during the procedure by Radiology nursing. PROCEDURE: The procedure risks, benefits, and alternatives were explained to the patient. Questions regarding the procedure were encouraged and answered. The patient understands and consents to the procedure. A time-out was performed prior to initiating the procedure. The right anterolateral chest wall was prepped with chlorhexidine in a sterile fashion, and a sterile drape was applied covering the operative field. A sterile gown and sterile gloves were used for the procedure. Local anesthesia was provided with 1% Lidocaine. CT was performed to localize a right lateral pleural mass as well as right-sided pleural effusion. From an anterior approach, a 6 French Safe-T-Centesis catheter was advanced into the right pleural space under CT guidance. The catheter was attached to vacuum bottles and thoracentesis performed. Simultaneous insertion of a 17 gauge trocar needle was performed at the level of a lateral right pleural mass. A total of 4 separate 18 gauge core biopsy samples were obtained. After the biopsy procedure additional CT images were obtained. The Safe-T-Centesis catheter was removed. COMPLICATIONS: None FINDINGS: There is a massive recurrent right pleural effusion with multiple hyperdense pleural and subpleural masses. A lateral pleural mass measuring  approximately 2.2 cm was chosen for biopsy. Solid tissue samples were obtained. The patient tolerated removal of 3 L of grossly bloody fluid. Postprocedural CT shows moderate residual fluid remaining and improved aeration of the underlying right lung with no pneumothorax. There is abnormal atelectasis and consolidation of the central lung. IMPRESSION: 1. CT-guided biopsy of 2.2 cm right lateral pleural mass. 2. CT-guided thoracentesis also performed with removal of 3 L of grossly bloody fluid. Electronically Signed   By: Aletta Edouard M.D.   On: 09/08/2018 09:10   Ct Biopsy  Result Date: 09/08/2018 CLINICAL DATA:  Large right bloody pleural effusion and multiple pleural masses. Negative cytology on prior fluid withdrawn by thoracentesis on 09/04/2018. Significant reaccumulation pleural fluid since that time by chest x-ray. Request  for biopsy of pleural mass for tissue diagnosis. EXAM: 1. CT-GUIDED RIGHT THORACENTESIS 2. CT GUIDED CORE BIOPSY OF RIGHT PLEURAL MASS ANESTHESIA/SEDATION: 2.0 mg IV Versed; 100 mcg IV Fentanyl Total Moderate Sedation Time:  23 minutes. The patient's level of consciousness and physiologic status were continuously monitored during the procedure by Radiology nursing. PROCEDURE: The procedure risks, benefits, and alternatives were explained to the patient. Questions regarding the procedure were encouraged and answered. The patient understands and consents to the procedure. A time-out was performed prior to initiating the procedure. The right anterolateral chest wall was prepped with chlorhexidine in a sterile fashion, and a sterile drape was applied covering the operative field. A sterile gown and sterile gloves were used for the procedure. Local anesthesia was provided with 1% Lidocaine. CT was performed to localize a right lateral pleural mass as well as right-sided pleural effusion. From an anterior approach, a 6 French Safe-T-Centesis catheter was advanced into the right pleural  space under CT guidance. The catheter was attached to vacuum bottles and thoracentesis performed. Simultaneous insertion of a 17 gauge trocar needle was performed at the level of a lateral right pleural mass. A total of 4 separate 18 gauge core biopsy samples were obtained. After the biopsy procedure additional CT images were obtained. The Safe-T-Centesis catheter was removed. COMPLICATIONS: None FINDINGS: There is a massive recurrent right pleural effusion with multiple hyperdense pleural and subpleural masses. A lateral pleural mass measuring approximately 2.2 cm was chosen for biopsy. Solid tissue samples were obtained. The patient tolerated removal of 3 L of grossly bloody fluid. Postprocedural CT shows moderate residual fluid remaining and improved aeration of the underlying right lung with no pneumothorax. There is abnormal atelectasis and consolidation of the central lung. IMPRESSION: 1. CT-guided biopsy of 2.2 cm right lateral pleural mass. 2. CT-guided thoracentesis also performed with removal of 3 L of grossly bloody fluid. Electronically Signed   By: Aletta Edouard M.D.   On: 09/08/2018 09:10    Scheduled Meds: . mouth rinse  15 mL Mouth Rinse BID   Continuous Infusions: . ampicillin-sulbactam (UNASYN) IV Stopped (09/08/18 1202)     LOS: 3 days   Time spent: 25 minutes.  Patrecia Pour, MD [facility] [email or web address] Password [email or web address] 09/08/2018, 5:02 PM

## 2018-09-09 ENCOUNTER — Inpatient Hospital Stay (HOSPITAL_COMMUNITY): Payer: Medicare Other

## 2018-09-09 LAB — CBC
HCT: 24.2 % — ABNORMAL LOW (ref 39.0–52.0)
Hemoglobin: 7.3 g/dL — ABNORMAL LOW (ref 13.0–17.0)
MCH: 27.8 pg (ref 26.0–34.0)
MCHC: 30.2 g/dL (ref 30.0–36.0)
MCV: 92 fL (ref 80.0–100.0)
NRBC: 0 % (ref 0.0–0.2)
Platelets: 353 10*3/uL (ref 150–400)
RBC: 2.63 MIL/uL — AB (ref 4.22–5.81)
RDW: 13.5 % (ref 11.5–15.5)
WBC: 11.8 10*3/uL — ABNORMAL HIGH (ref 4.0–10.5)

## 2018-09-09 LAB — CULTURE, BLOOD (ROUTINE X 2)
Culture: NO GROWTH
Culture: NO GROWTH
SPECIAL REQUESTS: ADEQUATE
SPECIAL REQUESTS: ADEQUATE

## 2018-09-09 LAB — ABO/RH: ABO/RH(D): O POS

## 2018-09-09 LAB — PREPARE RBC (CROSSMATCH)

## 2018-09-09 MED ORDER — SODIUM CHLORIDE 0.9 % IV SOLN
INTRAVENOUS | Status: DC
  Administered 2018-09-09 – 2018-09-12 (×7): via INTRAVENOUS

## 2018-09-09 MED ORDER — SODIUM CHLORIDE 0.9% IV SOLUTION
Freq: Once | INTRAVENOUS | Status: AC
  Administered 2018-09-09: 16:00:00 via INTRAVENOUS

## 2018-09-09 NOTE — Progress Notes (Signed)
PROGRESS NOTE  Brian Ramirez  IRC:[id number] DOB: March 27, 1946 DOA: 09/04/2018 PCP: [name], MD   Brief Narrative: Brian Ramirez is a 73 y.o. male with medical history significant of hernia, prior tobacco abuse presents to ED with gradually worsening sob over the past month. Symptoms began as generalized malaise around Christmas 2019 which progressed to worsening SOB. Patient reports sob worsened to where pt was unable to ambulate even 3-5 feet, requiring 20-30 min to catch his breath. Pt is previously very active and exercises and lifts weights regularly. Since feeling SOB, pt has been unable to remain active. Pt has otherwise denied florid wt loss but reports decreased appetite and feeling dehydrated. Pt states he was a former smoker, quit 88yrs ago  ED Course: In the ED, pt underwent CTA chest with findings of two R sided lung masses, measuring 4.6cm and 2.6cm with large R effusion. Pt since underwent IR guided thoracentesis. While getting procedure, Hospitalist consulted for consideration for admission.   Hospital Course: Cytology returned without malignant cells identified. IR is consulted for tissue biopsy. Leukocytosis continues and infiltrate appears to be developing, so antibiotics started.   Assessment & Plan: Principal Problem:   Pleural effusion Active Problems:   Lung mass   Mass of right lung  Acute hypoxic respiratory failure: Due to effusion and primary lung lesions, also possible developing pneumonia. - Continue supplemental oxygen to maintain SpO2 >90%.   Left perihilar CAP:  - Started unasyn, mucus/sputum production increased from baseline - Sputum culture ordered.   - Trend fever, give tylenol prn. No new infiltrates on CXR, evidence of wound infection on exam, or symptoms of UTI to otherwise explain fever.   Large right-sided pleural effusion: In setting of right lung masses, suspect malignant etiology. Exudative by light's criteria, grossly bloody.  Abundant WBCs on gram stain, no culture positivity. Cytology negative.  - Repeat CXR personally reviewed today showing similar large volume right pleural effusion with left hilar fullness, stable. Removed 3L on 1/24 which accumulated from 1/21. May need pleurx  Lung masses in RUL x2 (4.6cm, 2.6cm) as well as likely metastases on the left and right hilum, subcarinal lymph nodes and pleura seen on CT imaging. Former smoker.  - s/p CT-guided pleural mass biopsy 1/24, awaiting pathology report. Plan to discuss with oncology 1/27 as may benefit from expedited Tx to minimize effusion.  - Note, he is a poor bronchoscopy candidate due to previous jaw fracture, limited mobility.  AKI: Uncertain baseline, but CrCl has normalized >40ml/min from Cr 1.33 on admission to 0.97.   Anemia of chronic disease and acute blood loss anemia: Ferritin high, TIBC low.  - Hgb continues downward trend with known site of bleeding intrathoracic. Severely dyspneic, so will transfuse 1u PRBCs for symptomatic anemia and monitor CBC serially.  Dehydration: Improved, taking po. - Restart IV fluids with ~1L/d effusion accumulation.  Remote tobacco use:  - Noted.  Right paraspinal lumbar muscle strain: Due to laying in bed most likely. No red flag features.  - Trial tylenol/NSAID, heating pad. Improved.  DVT prophylaxis: Due to bloody pleural effusion and dropping hgb, recommended SCDs which the patient has declined. Stopped pharmacologic DVT ppx. Pt knows to ambulate regularly. Code Status: Full Family Communication: None at bedside Disposition Plan: Anticipate DC home after stabilization of respiratory status. Remains severely dyspneic, suspect he will need pleurx catheter placement. Also obtaining expedited workup/diagnosis or suspected pulmonary malignancy. Low threshold for transfer to SDU given elevated MEWS scores, though patient is more comfortable  than his vital signs would suggest.  Consultants:   Interventional  radiology  Procedures:   Thoracentesis 09/04/2018, 09/07/2018  CT-guided needle biopsy 09/07/2018  Antimicrobials:  Levaquin 1/21  Unasyn 1/23 >>   Subjective: Having ongoing cough, no fevers. Dyspnea is increased from baseline but improved since thoracentesis yesterday. No chest pain. No other bleeding.  Objective: Vitals:   09/09/18 1624 09/09/18 1645 09/09/18 1747 09/09/18 1840  BP: 99/61 (!) 108/58 109/68 122/73  Pulse: (!) 102 (!) 102 (!) 105 96  Resp: (!) 26 (!) 28 (!) 24 (!) 24  Temp: 100.3 F (37.9 C) 98.8 F (37.1 C) 99 F (37.2 C) 99.6 F (37.6 C)  TempSrc: Oral Oral Oral Oral  SpO2: 92% 92% 99% 94%  Weight:      Height:        Intake/Output Summary (Last 24 hours) at 09/09/2018 1929 Last data filed at 09/09/2018 1815 Gross per 24 hour  Intake 1001.28 ml  Output 625 ml  Net 376.28 ml   Filed Weights   09/04/18 1314 09/04/18 1851  Weight: 74.8 kg 70.7 kg   Gen: Tall, thin, WDWN male in no distress Pulm: Nonlabored tachypnea with frequent hacking cough, decreased at right lower/mid fields. No wheezing. CV: Regular tachycardia, active precordium. No murmur, rub, or gallop. No JVD, no dependent edema. GI: Abdomen soft, non-tender, non-distended, with normoactive bowel sounds.  Ext: Warm, no deformities Skin: No rashes, lesions or ulcers on visualized skin. Puncture sites without significant erythema, no discharge, appropriately modestly tender.  Neuro: Alert and oriented. No focal neurological deficits. Psych: Judgement and insight appear normal, jittery. Mood anxious & affect congruent. Behavior is appropriate.    Data Reviewed: I have personally reviewed following labs and imaging studies  CBC: Recent Labs  Lab 09/04/18 1335 09/05/18 0459 09/06/18 0628 09/07/18 0540 09/08/18 0541 09/09/18 0513  WBC 18.6* 14.7* 12.2* 11.8* 12.8* 11.8*  NEUTROABS 15.8*  --   --   --   --   --   HGB 10.3* 8.4* 7.9* 8.1* 7.7* 7.3*  HCT 33.0* 27.1* 25.8* 27.1* 25.1*  24.2*  MCV 89.9 91.6 93.1 94.4 90.0 92.0  PLT 457* 320 309 367 441* 161   Basic Metabolic Panel: Recent Labs  Lab 09/04/18 1335 09/05/18 0459 09/07/18 0540  NA 132* 133* 134*  K 5.0 4.6 4.6  CL 97* 100 99  CO2 24 25 25   GLUCOSE 207* 112* 117*  BUN 26* 21 15  CREATININE 1.33* 0.97 0.73  CALCIUM 8.7* 8.4* 8.4*   GFR: Estimated Creatinine Clearance: 84.7 mL/min (by C-G formula based on SCr of 0.73 mg/dL). Liver Function Tests: Recent Labs  Lab 09/04/18 1335 09/05/18 0459  AST 24 28  ALT 24 24  ALKPHOS 68 57  BILITOT 1.0 0.8  PROT 7.5 6.0*  ALBUMIN 2.8* 2.2*   Cardiac Enzymes: Recent Labs  Lab 09/04/18 1334  TROPONINI <0.03   Recent Results (from the past 240 hour(s))  Blood culture (routine x 2)     Status: None   Collection Time: 09/04/18  3:01 PM  Result Value Ref Range Status   Specimen Description   Final    BLOOD RIGHT HAND Performed at [facility] [address], [place], [place] [address]    Special Requests   Final    BOTTLES DRAWN AEROBIC AND ANAEROBIC Blood Culture adequate volume Performed at [facility] [address], [place], [place] [address]    Culture   Final    NO  GROWTH 5 DAYS Performed at [facility], [place] [address], [place], [place] [address]    Report Status 09/09/2018 FINAL  Final  Body fluid culture     Status: None   Collection Time: 09/04/18  4:58 PM  Result Value Ref Range Status   Specimen Description   Final    PLEURAL RIGHT Performed at [facility] [address] [place], [place] [address]    Special Requests   Final    NONE Performed at [facility], [place] [address] [place], [place] [address]    Gram Stain   Final    ABUNDANT WBC PRESENT,BOTH PMN AND MONONUCLEAR NO ORGANISMS SEEN    Culture   Final    NO GROWTH 3 DAYS Performed at [facility], [place] [address], [place], [place] [address]    Report Status 09/08/2018 FINAL   Final  Blood culture (routine x 2)     Status: None   Collection Time: 09/04/18  7:46 PM  Result Value Ref Range Status   Specimen Description   Final    BLOOD RIGHT ANTECUBITAL Performed at [facility] [address] [place], [place] [address]    Special Requests   Final    BOTTLES DRAWN AEROBIC AND ANAEROBIC Blood Culture adequate volume Performed at [facility] [address], [place], [place] [address]    Culture   Final    NO GROWTH 5 DAYS Performed at [facility], [place] [address], [place], [place] [address]    Report Status 09/09/2018 FINAL  Final      Radiology Studies: Dg Chest Port 1 View  Result Date: 09/09/2018 CLINICAL DATA:  Acute respiratory failure. EXAM: PORTABLE CHEST 1 VIEW COMPARISON:  Chest CT 09/07/2018 FINDINGS: Cardiomediastinal silhouette is normal. Mediastinal contours appear intact. Calcific atherosclerotic disease and tortuosity of the aorta. There is no evidence of pneumothorax. Minimally decreased right pleural effusion, still large in volume. Ill-defined right upper lobe and left lower hemithorax pulmonary masses. Osseous structures are without acute abnormality. Soft tissues are grossly normal. IMPRESSION: Minimally decreased in volume large right pleural effusion. Bilateral pulmonary masses. Calcific atherosclerotic disease and tortuosity of the aorta. Electronically Signed   By: Fidela Salisbury M.D.   On: 09/09/2018 17:46    Scheduled Meds: . mouth rinse  15 mL Mouth Rinse BID   Continuous Infusions: . sodium chloride 110 mL/hr at 09/09/18 1442  . ampicillin-sulbactam (UNASYN) IV 3 g (09/09/18 1819)     LOS: 4 days   Time spent: 35 minutes.  Patrecia Pour, MD [facility] [email or web address] Password [facility] 09/09/2018, 7:29 PM

## 2018-09-09 NOTE — Progress Notes (Signed)
   09/09/18 1432  MEWS Score  Resp (!) 32  Pulse Rate (!) 130  BP (!) 150/75  Temp (!) 102.1 F (38.9 C)  Level of Consciousness Alert  SpO2 93 %  O2 Device Nasal Cannula  O2 Flow Rate (L/min) 2 L/min  MEWS Score  MEWS RR 2  MEWS Pulse 3  MEWS Systolic 0  MEWS LOC 0  MEWS Temp 2  MEWS Score 7  MEWS Score Color Red    MD aware of pt change. CXR ordered.   Delsy Etzkorn, Bing Neighbors, RN

## 2018-09-10 DIAGNOSIS — R918 Other nonspecific abnormal finding of lung field: Secondary | ICD-10-CM

## 2018-09-10 DIAGNOSIS — Z87891 Personal history of nicotine dependence: Secondary | ICD-10-CM

## 2018-09-10 DIAGNOSIS — R5081 Fever presenting with conditions classified elsewhere: Secondary | ICD-10-CM

## 2018-09-10 DIAGNOSIS — Z87442 Personal history of urinary calculi: Secondary | ICD-10-CM

## 2018-09-10 DIAGNOSIS — J90 Pleural effusion, not elsewhere classified: Secondary | ICD-10-CM

## 2018-09-10 DIAGNOSIS — D649 Anemia, unspecified: Secondary | ICD-10-CM

## 2018-09-10 LAB — BASIC METABOLIC PANEL
Anion gap: 7 (ref 5–15)
BUN: 11 mg/dL (ref 8–23)
CO2: 25 mmol/L (ref 22–32)
Calcium: 7.6 mg/dL — ABNORMAL LOW (ref 8.9–10.3)
Chloride: 99 mmol/L (ref 98–111)
Creatinine, Ser: 0.71 mg/dL (ref 0.61–1.24)
GFR calc Af Amer: >60 mL/min (ref >60)
GFR calc non Af Amer: >60 mL/min (ref >60)
Glucose, Bld: 88 mg/dL (ref 70–99)
Potassium: 4.2 mmol/L (ref 3.5–5.1)
Sodium: 131 mmol/L — ABNORMAL LOW (ref 135–145)

## 2018-09-10 LAB — CBC
HCT: 23.2 % — ABNORMAL LOW (ref 39.0–52.0)
Hemoglobin: 7.1 g/dL — ABNORMAL LOW (ref 13.0–17.0)
MCH: 27.7 pg (ref 26.0–34.0)
MCHC: 30.6 g/dL (ref 30.0–36.0)
MCV: 90.6 fL (ref 80.0–100.0)
PLATELETS: 328 10*3/uL (ref 150–400)
RBC: 2.56 MIL/uL — ABNORMAL LOW (ref 4.22–5.81)
RDW: 14.3 % (ref 11.5–15.5)
WBC: 8.6 10*3/uL (ref 4.0–10.5)
nRBC: 0 % (ref 0.0–0.2)

## 2018-09-10 LAB — LACTATE DEHYDROGENASE: LDH: 167 U/L (ref 98–192)

## 2018-09-10 LAB — PREPARE RBC (CROSSMATCH)

## 2018-09-10 LAB — HEMOGLOBIN AND HEMATOCRIT, BLOOD
HCT: 30.2 % — ABNORMAL LOW (ref 39.0–52.0)
Hemoglobin: 9.2 g/dL — ABNORMAL LOW (ref 13.0–17.0)

## 2018-09-10 LAB — EXPECTORATED SPUTUM ASSESSMENT W GRAM STAIN, RFLX TO RESP C

## 2018-09-10 LAB — MRSA PCR SCREENING: MRSA by PCR: NEGATIVE

## 2018-09-10 MED ORDER — PIPERACILLIN-TAZOBACTAM 3.375 G IVPB
3.3750 g | Freq: Three times a day (TID) | INTRAVENOUS | Status: DC
  Administered 2018-09-10 – 2018-09-13 (×10): 3.375 g via INTRAVENOUS
  Filled 2018-09-10 (×11): qty 50

## 2018-09-10 MED ORDER — SODIUM CHLORIDE 0.9% IV SOLUTION: Freq: Once | INTRAVENOUS | Status: DC

## 2018-09-10 MED ORDER — VANCOMYCIN HCL IN DEXTROSE 1-5 GM/200ML-% IV SOLN: 1000.0000 mg | Freq: Two times a day (BID) | INTRAVENOUS | Status: DC

## 2018-09-10 MED ORDER — VANCOMYCIN HCL 10 G IV SOLR
1500.0000 mg | Freq: Once | INTRAVENOUS | Status: DC
  Administered 2018-09-10: 1500 mg via INTRAVENOUS
  Filled 2018-09-10: qty 1500
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
## 2018-09-10 NOTE — Care Management Important Message (Signed)
Important Message  Patient Details  Name: RENSO SWETT MRN: [id number] Date of Birth: 11-30-1945   Medicare Important Message Given:  Yes    Kerin Salen 09/10/2018, 12:55 [place] Message  Patient Details  Name: DERRALL HICKS MRN: [id number] Date of Birth: Dec 05, 1945   Medicare Important Message Given:  Yes    Kerin Salen 09/10/2018, 12:55 PM

## 2018-09-10 NOTE — Progress Notes (Addendum)
error  Betsy Coder, MD 09/10/2018, 9:37 AM
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
## 2018-09-10 NOTE — Progress Notes (Signed)
PROGRESS NOTE  Brian Ramirez  NIO:[id number] DOB: September 09, 1945 DOA: 09/04/2018 PCP: [name], MD   Brief Narrative: Brian Ramirez is a 73 y.o. male with medical history significant of hernia, prior tobacco abuse presents to ED with gradually worsening sob over the past month. Symptoms began as generalized malaise around Christmas 2019 which progressed to worsening SOB. Patient reports sob worsened to where pt was unable to ambulate even 3-5 feet, requiring 20-30 min to catch his breath. Pt is previously very active and exercises and lifts weights regularly. Since feeling SOB, pt has been unable to remain active. Pt has otherwise denied florid wt loss but reports decreased appetite and feeling dehydrated. Pt states he was a former smoker, quit 53yrs ago  ED Course: In the ED, pt underwent CTA chest with findings of two R sided lung masses, measuring 4.6cm and 2.6cm with large R effusion. Pt since underwent IR guided thoracentesis. While getting procedure, Hospitalist consulted for consideration for admission.   Hospital Course: Cytology returned without malignant cells identified. IR is consulted for tissue biopsy. Leukocytosis continues and infiltrate appears to be developing, so antibiotics started.   Assessment & Plan: Principal Problem:   Pleural effusion Active Problems:   Lung mass   Mass of right lung  Acute hypoxic respiratory failure: Due to effusion and primary lung lesions, also possible developing pneumonia. - Continue supplemental oxygen to maintain SpO2 >90%.   Left perihilar CAP:  - Started unasyn, mucus/sputum production increased from baseline, to include pseudomonal coverage, transition to zosyn. Add vancomycin given increase while hospitalized.  - Sputum culture sent, pending.    - Trend fever, give tylenol prn. No new infiltrates on CXR, evidence of wound infection on exam, or symptoms of UTI to otherwise explain fever.   Large right-sided pleural effusion:  In setting of right lung masses, suspect malignant etiology. Exudative by light's criteria, grossly bloody. Abundant WBCs on gram stain, no culture positivity. Cytology negative.  - Repeat CXR reviewed with patient today. Suspect he will need chest tube/pleurx catheter placement. Have requested this from IR.   Lung masses in RUL x2 (4.6cm, 2.6cm) as well as likely metastases on the left and right hilum, subcarinal lymph nodes and pleura seen on CT imaging. Former smoker.  - s/p CT-guided pleural mass biopsy 1/24, awaiting pathology report. Oncology, Dr. [name] consulted, still pending path report.  - Note, he is a poor bronchoscopy candidate due to previous jaw fracture, limited mobility.  AKI: Uncertain baseline, but CrCl has normalized >54ml/min from Cr 1.33 on admission to 0.97.   Anemia of chronic disease and acute blood loss anemia: Ferritin high, TIBC low.  - Hgb 7.3 > 7.1 despite 1u PRBCs 1/26. Will repeat transfusion and check post transfusion H/H. Discussed with RN. If < 8g/dl will plan on repeat transfusion.   Dehydration: Improved, taking po. - Restarted IV fluids with ~1L/d effusion accumulation.  Remote tobacco use:  - Noted.  Right paraspinal lumbar muscle strain: Due to laying in bed most likely. No red flag features. Improving.  - Trial tylenol/NSAID, heating pad. Improved.  DVT prophylaxis: SCDs Code Status: Full Family Communication: None at bedside Disposition Plan: Anticipate DC home after stabilization of respiratory status.   Consultants:   Interventional radiology  Oncology  Procedures:   Thoracentesis 09/04/2018, 09/07/2018  CT-guided needle biopsy 09/07/2018  Pleurx catheter placement requested  Antimicrobials:  Levaquin 1/21  Unasyn 1/23 - 1/27  Zosyn 1/27  Vancomycin 1/27 x1  Subjective: Having intermittent fevers without feeling  ill. Increased cough with mucous sent for sputum culture. Dyspnea is improved from when he arrived but worse  today than yesterday, severe just when going to the bathroom. No chest pain. +Palpitations, aware of fast heart rate.  Objective: Vitals:   09/10/18 0331 09/10/18 0458 09/10/18 0607 09/10/18 0753  BP: 138/83   118/64  Pulse: (!) 101   (!) 101  Resp: (!) 27   (!) 22  Temp: 100.2 F (37.9 C) (!) 102.5 F (39.2 C) 99.7 F (37.6 C) 99.3 F (37.4 C)  TempSrc: Oral Oral Oral Oral  SpO2: 94%   93%  Weight:      Height:        Intake/Output Summary (Last 24 hours) at 09/10/2018 1336 Last data filed at 09/10/2018 6759 Gross per 24 hour  Intake 1001.28 ml  Output 375 ml  Net 626.28 ml   Filed Weights   09/04/18 1314 09/04/18 1851  Weight: 74.8 kg 70.7 kg   Gen: 73 y.o. male in no distress Pulm: Nonlabored tachypnea. Decreased at right mid and lower lung fields CV: Regular tachycardia. No murmur, rub, or gallop. No JVD, no dependent edema. GI: Abdomen soft, non-tender, non-distended, with normoactive bowel sounds.  Ext: Warm, no deformities Skin: No bleeding, bruising, or other rashes, lesions or ulcers on visualized skin. Neuro: Alert and oriented. No focal neurological deficits. Psych: Judgement and insight appear fair. Mood euthymic & affect congruent. Behavior is appropriate.    Data Reviewed: I have personally reviewed following labs and imaging studies  CBC: Recent Labs  Lab 09/04/18 1335  09/06/18 0628 09/07/18 0540 09/08/18 0541 09/09/18 0513 09/10/18 0516  WBC 18.6*   < > 12.2* 11.8* 12.8* 11.8* 8.6  NEUTROABS 15.8*  --   --   --   --   --   --   HGB 10.3*   < > 7.9* 8.1* 7.7* 7.3* 7.1*  HCT 33.0*   < > 25.8* 27.1* 25.1* 24.2* 23.2*  MCV 89.9   < > 93.1 94.4 90.0 92.0 90.6  PLT 457*   < > 309 367 441* 353 328   < > = values in this interval not displayed.   Basic Metabolic Panel: Recent Labs  Lab 09/04/18 1335 09/05/18 0459 09/07/18 0540 09/10/18 0516  NA 132* 133* 134* 131*  K 5.0 4.6 4.6 4.2  CL 97* 100 99 99  CO2 24 25 25 25   GLUCOSE 207* 112* 117*  88  BUN 26* 21 15 11   CREATININE 1.33* 0.97 0.73 0.71  CALCIUM 8.7* 8.4* 8.4* 7.6*   GFR: Estimated Creatinine Clearance: 84.7 mL/min (by C-G formula based on SCr of 0.71 mg/dL). Liver Function Tests: Recent Labs  Lab 09/04/18 1335 09/05/18 0459  AST 24 28  ALT 24 24  ALKPHOS 68 57  BILITOT 1.0 0.8  PROT 7.5 6.0*  ALBUMIN 2.8* 2.2*   Cardiac Enzymes: Recent Labs  Lab 09/04/18 1334  TROPONINI <0.03   Recent Results (from the past 240 hour(s))  Blood culture (routine x 2)     Status: None   Collection Time: 09/04/18  3:01 PM  Result Value Ref Range Status   Specimen Description   Final    BLOOD RIGHT HAND Performed at [facility] [address], [place], [place] [address]    Special Requests   Final    BOTTLES DRAWN AEROBIC AND ANAEROBIC Blood Culture adequate volume Performed at [facility] [address], [place], [place] [address]  Culture   Final    NO GROWTH 5 DAYS Performed at [facility], [place] [address] [place], [place] [address]    Report Status 09/09/2018 FINAL  Final  Body fluid culture     Status: None   Collection Time: 09/04/18  4:58 PM  Result Value Ref Range Status   Specimen Description   Final    PLEURAL RIGHT Performed at [facility] [address], [place], [place] [address]    Special Requests   Final    NONE Performed at [facility], [place] [address], [place], [place] [address]    Gram Stain   Final    ABUNDANT WBC PRESENT,BOTH PMN AND MONONUCLEAR NO ORGANISMS SEEN    Culture   Final    NO GROWTH 3 DAYS Performed at [facility], [place] [address] [place], [place] [address]    Report Status 09/08/2018 FINAL  Final  Blood culture (routine x 2)     Status: None   Collection Time: 09/04/18  7:46 PM  Result Value Ref Range Status   Specimen Description   Final    BLOOD RIGHT ANTECUBITAL Performed at [facility]  [address], [place], [place] [address]    Special Requests   Final    BOTTLES DRAWN AEROBIC AND ANAEROBIC Blood Culture adequate volume Performed at [facility] [address], [place], [place] [address]    Culture   Final    NO GROWTH 5 DAYS Performed at [facility], [place] [address] [place], [place] [address]    Report Status 09/09/2018 FINAL  Final  Expectorated sputum assessment w rflx to resp cult     Status: None   Collection Time: 09/10/18  9:07 AM  Result Value Ref Range Status   Specimen Description SPUTUM  Final   Special Requests NONE  Final   Sputum evaluation   Final    THIS SPECIMEN IS ACCEPTABLE FOR SPUTUM CULTURE Performed at [facility], [place] [address], [place], [place] [address]    Report Status 09/10/2018 FINAL  Final  MRSA PCR Screening     Status: None   Collection Time: 09/10/18 12:04 PM  Result Value Ref Range Status   MRSA by PCR NEGATIVE NEGATIVE Final    Comment:        The GeneXpert MRSA Assay (FDA approved for NASAL specimens only), is one component of a comprehensive MRSA colonization surveillance program. It is not intended to diagnose MRSA infection nor to guide or monitor treatment for MRSA infections. Performed at [facility], [place] [address], [place], [place] [address]       Radiology Studies: Dg Chest Port 1 View  Result Date: 09/09/2018 CLINICAL DATA:  Acute respiratory failure. EXAM: PORTABLE CHEST 1 VIEW COMPARISON:  Chest CT 09/07/2018 FINDINGS: Cardiomediastinal silhouette is normal. Mediastinal contours appear intact. Calcific atherosclerotic disease and tortuosity of the aorta. There is no evidence of pneumothorax. Minimally decreased right pleural effusion, still large in volume. Ill-defined right upper lobe and left lower hemithorax pulmonary masses. Osseous structures are without acute abnormality. Soft tissues are grossly normal. IMPRESSION: Minimally decreased in  volume large right pleural effusion. Bilateral pulmonary masses. Calcific atherosclerotic disease and tortuosity of the aorta. Electronically Signed   By: Fidela Salisbury M.D.   On: 09/09/2018 17:46    Scheduled Meds: . sodium chloride   Intravenous Once  . mouth rinse  15 mL Mouth  Rinse BID   Continuous Infusions: . sodium chloride 110 mL/hr at 09/10/18 1158  . piperacillin-tazobactam (ZOSYN)  IV 3.375 g (09/10/18 1201)  . vancomycin 1,500 mg (09/10/18 1200)  . [START ON 09/11/2018] vancomycin       LOS: 5 days   Time spent: 35 minutes.  Patrecia Pour, MD [facility] [email or web address] Password [email or web address] 09/10/2018, 1:36 PM

## 2018-09-10 NOTE — Progress Notes (Signed)
PT Cancellation Note  Patient Details Name: Brian Ramirez MRN: [id number] DOB: 07-Jun-1946   Cancelled Treatment:    Reason Eval/Treat Not Completed: Attempted PT tx session-pt declined participation at this time. Will check back another day.    Weston Anna, PT Acute Rehabilitation Services Pager: [phone] Office: [phone]

## 2018-09-10 NOTE — Progress Notes (Signed)
Pharmacy Antibiotic Note  Brian Ramirez is a 73 y.o. male presented to the ED 09/04/2018 complaining of dyspnea with minimal exertion for several weeks. Chest CTa on 1/21 was negative for PE, but showed 2 right lung masses and large right pleural effusion. Patient underwent thoracentesis on 1/21 w/ fluid sent for culture analysis. Of note he received levaquin 750 mg IV x 1 dose in the ED on 1/21. Unasyn initiated on 1/23 for suspected pneumonia. Patient scheduled for possible thoracentesis and a CT biopsy on 1/24.  Today, 09/10/18 - 8.6, improved - Temp 101.4 - Scr improved to 0.71 (1.33 on admission), CrCl 84.7 ml/min  Plan: - Continue Unasyn to 3 gm IV q6h for renal function and pneumonia indication - No dose adjustments needed, Pharmacy will sign off note writing but continue to follow during bedside rounding  Height: 6\' 1"  (185.4 cm) Weight: 155 lb 13.8 oz (70.7 kg) IBW/kg (Calculated) : 79.9  Temp (24hrs), Avg:100.9 F (38.3 C), Min:98.8 F (37.1 C), Max:103 F (39.4 C)  Recent Labs  Lab 09/04/18 1335 09/04/18 1501 09/04/18 1704 09/05/18 0459 09/06/18 0628 09/07/18 0540 09/08/18 0541 09/09/18 0513 09/10/18 0516  WBC 18.6*  --   --  14.7* 12.2* 11.8* 12.8* 11.8* 8.6  CREATININE 1.33*  --   --  0.97  --  0.73  --   --  0.71  LATICACIDVEN  --  2.1* 1.3  --   --   --   --   --   --     Estimated Creatinine Clearance: 84.7 mL/min (by C-G formula based on SCr of 0.71 mg/dL).    No Known Allergies  Antimicrobials this admission: 1/21 Lvq 750 IV x 1 1/23 Unasyn >>  Dose adjustments this admission:  Microbiology results: 1/21 Bcx x2: NGF 1/21 R pleural fluid: NGF  Thank you for allowing pharmacy to be a part of this patient's care.  Peggyann Juba, PharmD, BCPS Pager: [phone] 09/10/2018 7:51 AM

## 2018-09-10 NOTE — Progress Notes (Signed)
Patient with temp of 101.7 during pre-blood vital signs. MD made aware. Tylenol given per MAR. MD gave verbal order to go ahead and proceed with blood transfusion. Will monitor patient for further fevers and any other signs of a blood transfusion reaction.

## 2018-09-10 NOTE — Progress Notes (Signed)
Pharmacy Antibiotic Note  Brian Ramirez is a 73 y.o. male presented to the ED 09/04/2018 complaining of dyspnea with minimal exertion for several weeks. Chest CTa on 1/21 was negative for PE, but showed 2 right lung masses and large right pleural effusion. Patient underwent thoracentesis on 1/21 w/ fluid sent for culture analysis. Of note he received levaquin 750 mg IV x 1 dose in the ED on 1/21. Unasyn initiated on 1/23 for suspected pneumonia.   Patient with continued fevers on day 5 of Unasyn. Sputum sample sent for culture on 1/27. Broadening antibiotic coverage by adding Vanc and changing Unasyn to Zosyn.  Plan: - Initiate vancomycin 1500 mg IV loading dose x1 and then 1000 mg IV q12h (AUC 469.9, Cmax 31, Cmin 12.4) - Change Unasyn to Zosyn 3.375 g IV q8h - Monitor renal function - Monitor for s/sx of infection  Height: 6\' 1"  (185.4 cm) Weight: 155 lb 13.8 oz (70.7 kg) IBW/kg (Calculated) : 79.9  Temp (24hrs), Avg:100.8 F (38.2 C), Min:98.8 F (37.1 C), Max:103 F (39.4 C)  Recent Labs  Lab 09/04/18 1335 09/04/18 1501 09/04/18 1704 09/05/18 0459 09/06/18 0628 09/07/18 0540 09/08/18 0541 09/09/18 0513 09/10/18 0516  WBC 18.6*  --   --  14.7* 12.2* 11.8* 12.8* 11.8* 8.6  CREATININE 1.33*  --   --  0.97  --  0.73  --   --  0.71  LATICACIDVEN  --  2.1* 1.3  --   --   --   --   --   --     Estimated Creatinine Clearance: 84.7 mL/min (by C-G formula based on SCr of 0.71 mg/dL).    No Known Allergies  Antimicrobials this admission: 1/21 Lvq 750 IV x 1 1/23 Unasyn >> 1/27 1/27 Vancomycin >> 1/27 Zosyn >>  Dose adjustments this admission:  Microbiology results: 1/21 Bcx x2: NGF 1/21 R pleural fluid: NGF 1/27 MRSA PCR: pending 1/27 Sputum cx: pending  Thank you for allowing pharmacy to be a part of this patient's care.  Jonette Eva PharmD Candidate 09/10/2018 11:42 AM

## 2018-09-10 NOTE — Progress Notes (Addendum)
Referring Physician(s): Grunz,R  Supervising Physician: Daryll Brod  Patient Status:  [facility] - In-pt  Chief Complaint: Dyspnea, cough, recurrent right pleural effusion   Subjective: Patient familiar to IR service prior large volume right thoracenteses on 09/04/2018 and 09/07/2018 along with right pleural mass biopsy on 09/07/2018. Pathology pending.  He is a former smoker and recently presented to the hospital with worsening dyspnea and diminished appetite.  Recent CT revealed right lung and pleural based masses along with large right pleural effusion.  He has no prior history of cancer.  Request now received for right Pleurx catheter placement.  He currently denies fever, headache, chest pain, abdominal pain, back pain, or abnormal bleeding.  Past Medical History:  Diagnosis Date  . Hernia 2012  . History of hiatal hernia   . History of kidney stones   . Personal history of kidney stones 1992   Past Surgical History:  Procedure Laterality Date  . HERNIA REPAIR  2012   inguinal  . INGUINAL HERNIA REPAIR Left 11/22/2016   Procedure: LAPAROSCOPIC  REPAIR OF LEFT INGUINAL HERNIA WITH MESH;  Surgeon: [name], MD;  Location: [facility] ORS;  Service: General;  Laterality: Left;      Allergies: Patient has no known allergies.  Medications: Prior to Admission medications   Medication Sig Start Date End Date Taking? Authorizing Provider  aspirin 81 MG tablet Take 81 mg by mouth daily.   Yes [provider]  benzonatate (TESSALON) 100 MG capsule Take 100 mg by mouth every 8 (eight) hours as needed for cough.   Yes [provider]  ibuprofen (ADVIL,MOTRIN) 200 MG tablet Take 200 mg by mouth daily as needed for headache or moderate pain.   Yes [provider]  Multiple Vitamin (MULTIVITAMIN WITH MINERALS) TABS tablet Take 1 tablet by mouth daily.   Yes [provider]  traMADol (ULTRAM) 50 MG tablet Take 1-2 tablets (50-100 mg total) by mouth every 6  (six) hours as needed for moderate pain or severe pain. Patient not taking: Reported on 09/04/2018 11/22/16   [name], MD     Vital Signs: BP 118/64 (BP Location: Right Arm)   Pulse (!) 101   Temp 99.3 F (37.4 C) (Oral)   Resp (!) 22   Ht 6\' 1"  (1.854 m)   Wt 155 lb 13.8 oz (70.7 kg)   SpO2 93%   BMI 20.56 kg/m   Physical Exam patient awake, alert.  Chest with diminished breath sounds right mid to lower lung fields, left clear.  Heart with tachycardic but regular rhythm.  Abdomen soft, positive bowel sounds, nontender.  No lower extremity edema.  Imaging: Ct Aspiration  Result Date: 09/08/2018 CLINICAL DATA:  Large right bloody pleural effusion and multiple pleural masses. Negative cytology on prior fluid withdrawn by thoracentesis on 09/04/2018. Significant reaccumulation pleural fluid since that time by chest x-ray. Request for biopsy of pleural mass for tissue diagnosis. EXAM: 1. CT-GUIDED RIGHT THORACENTESIS 2. CT GUIDED CORE BIOPSY OF RIGHT PLEURAL MASS ANESTHESIA/SEDATION: 2.0 mg IV Versed; 100 mcg IV Fentanyl Total Moderate Sedation Time:  23 minutes. The patient's level of consciousness and physiologic status were continuously monitored during the procedure by Radiology nursing. PROCEDURE: The procedure risks, benefits, and alternatives were explained to the patient. Questions regarding the procedure were encouraged and answered. The patient understands and consents to the procedure. A time-out was performed prior to initiating the procedure. The right anterolateral chest wall was prepped with chlorhexidine in a sterile fashion, and  a sterile drape was applied covering the operative field. A sterile gown and sterile gloves were used for the procedure. Local anesthesia was provided with 1% Lidocaine. CT was performed to localize a right lateral pleural mass as well as right-sided pleural effusion. From an anterior approach, a 6 French Safe-T-Centesis catheter was advanced into the  right pleural space under CT guidance. The catheter was attached to vacuum bottles and thoracentesis performed. Simultaneous insertion of a 17 gauge trocar needle was performed at the level of a lateral right pleural mass. A total of 4 separate 18 gauge core biopsy samples were obtained. After the biopsy procedure additional CT images were obtained. The Safe-T-Centesis catheter was removed. COMPLICATIONS: None FINDINGS: There is a massive recurrent right pleural effusion with multiple hyperdense pleural and subpleural masses. A lateral pleural mass measuring approximately 2.2 cm was chosen for biopsy. Solid tissue samples were obtained. The patient tolerated removal of 3 L of grossly bloody fluid. Postprocedural CT shows moderate residual fluid remaining and improved aeration of the underlying right lung with no pneumothorax. There is abnormal atelectasis and consolidation of the central lung. IMPRESSION: 1. CT-guided biopsy of 2.2 cm right lateral pleural mass. 2. CT-guided thoracentesis also performed with removal of 3 L of grossly bloody fluid. Electronically Signed   By: Aletta Edouard M.D.   On: 09/08/2018 09:10   Ct Biopsy  Result Date: 09/08/2018 CLINICAL DATA:  Large right bloody pleural effusion and multiple pleural masses. Negative cytology on prior fluid withdrawn by thoracentesis on 09/04/2018. Significant reaccumulation pleural fluid since that time by chest x-ray. Request for biopsy of pleural mass for tissue diagnosis. EXAM: 1. CT-GUIDED RIGHT THORACENTESIS 2. CT GUIDED CORE BIOPSY OF RIGHT PLEURAL MASS ANESTHESIA/SEDATION: 2.0 mg IV Versed; 100 mcg IV Fentanyl Total Moderate Sedation Time:  23 minutes. The patient's level of consciousness and physiologic status were continuously monitored during the procedure by Radiology nursing. PROCEDURE: The procedure risks, benefits, and alternatives were explained to the patient. Questions regarding the procedure were encouraged and answered. The patient  understands and consents to the procedure. A time-out was performed prior to initiating the procedure. The right anterolateral chest wall was prepped with chlorhexidine in a sterile fashion, and a sterile drape was applied covering the operative field. A sterile gown and sterile gloves were used for the procedure. Local anesthesia was provided with 1% Lidocaine. CT was performed to localize a right lateral pleural mass as well as right-sided pleural effusion. From an anterior approach, a 6 French Safe-T-Centesis catheter was advanced into the right pleural space under CT guidance. The catheter was attached to vacuum bottles and thoracentesis performed. Simultaneous insertion of a 17 gauge trocar needle was performed at the level of a lateral right pleural mass. A total of 4 separate 18 gauge core biopsy samples were obtained. After the biopsy procedure additional CT images were obtained. The Safe-T-Centesis catheter was removed. COMPLICATIONS: None FINDINGS: There is a massive recurrent right pleural effusion with multiple hyperdense pleural and subpleural masses. A lateral pleural mass measuring approximately 2.2 cm was chosen for biopsy. Solid tissue samples were obtained. The patient tolerated removal of 3 L of grossly bloody fluid. Postprocedural CT shows moderate residual fluid remaining and improved aeration of the underlying right lung with no pneumothorax. There is abnormal atelectasis and consolidation of the central lung. IMPRESSION: 1. CT-guided biopsy of 2.2 cm right lateral pleural mass. 2. CT-guided thoracentesis also performed with removal of 3 L of grossly bloody fluid. Electronically Signed   By:  Aletta Edouard M.D.   On: 09/08/2018 09:10   Dg Chest Port 1 View  Result Date: 09/09/2018 CLINICAL DATA:  Acute respiratory failure. EXAM: PORTABLE CHEST 1 VIEW COMPARISON:  Chest CT 09/07/2018 FINDINGS: Cardiomediastinal silhouette is normal. Mediastinal contours appear intact. Calcific  atherosclerotic disease and tortuosity of the aorta. There is no evidence of pneumothorax. Minimally decreased right pleural effusion, still large in volume. Ill-defined right upper lobe and left lower hemithorax pulmonary masses. Osseous structures are without acute abnormality. Soft tissues are grossly normal. IMPRESSION: Minimally decreased in volume large right pleural effusion. Bilateral pulmonary masses. Calcific atherosclerotic disease and tortuosity of the aorta. Electronically Signed   By: Fidela Salisbury M.D.   On: 09/09/2018 17:46    Labs:  CBC: Recent Labs    09/07/18 0540 09/08/18 0541 09/09/18 0513 09/10/18 0516  WBC 11.8* 12.8* 11.8* 8.6  HGB 8.1* 7.7* 7.3* 7.1*  HCT 27.1* 25.1* 24.2* 23.2*  PLT 367 441* 353 328    COAGS: Recent Labs    09/07/18 0540  INR 1.03    BMP: Recent Labs    09/04/18 1335 09/05/18 0459 09/07/18 0540 09/10/18 0516  NA 132* 133* 134* 131*  K 5.0 4.6 4.6 4.2  CL 97* 100 99 99  CO2 24 25 25 25   GLUCOSE 207* 112* 117* 88  BUN 26* 21 15 11   CALCIUM 8.7* 8.4* 8.4* 7.6*  CREATININE 1.33* 0.97 0.73 0.71  GFRNONAA 53* >60 >60 >60  GFRAA >60 >60 >60 >60    LIVER FUNCTION TESTS: Recent Labs    09/04/18 1335 09/05/18 0459  BILITOT 1.0 0.8  AST 24 28  ALT 24 24  ALKPHOS 68 57  PROT 7.5 6.0*  ALBUMIN 2.8* 2.2*    Assessment and Plan: 73 yo former smoker who recently presented to [facility] with worsening dyspnea and diminished appetite.  Recent CT revealed right lung and pleural based masses along with large right pleural effusion; status post right pleural mass biopsy on 09/07/2018 (path pending) along with  2 previous large volume right thoracenteses.  He has no prior history of cancer.  Request now received for right Pleurx catheter placement. Case has been reviewed by IR MD. Details/risks of procedure, including but not limited to, internal bleeding, infection, injury to adjacent structures discussed with patient with his  understanding consent.  Procedure tentatively scheduled for 1/28. Hgb today 7.1- scheduled for blood transfusion.   Electronically Signed: D. Rowe Robert, PA-C 09/10/2018, 2:50 PM   I spent a total of 25 minutes at the the patient's bedside AND on the patient's hospital floor or unit, greater than 50% of which was counseling/coordinating care for right pleurx catheter placement    Patient ID: OTNIEL HOE, male   DOB: [birth year], 73 y.o.   MRN: [id number]

## 2018-09-11 ENCOUNTER — Encounter (HOSPITAL_COMMUNITY): Payer: Self-pay | Admitting: Interventional Radiology

## 2018-09-11 ENCOUNTER — Inpatient Hospital Stay (HOSPITAL_COMMUNITY): Payer: Medicare Other

## 2018-09-11 HISTORY — PX: IR PERC PLEURAL DRAIN W/INDWELL CATH W/IMG GUIDE: IMG5383

## 2018-09-11 LAB — CBC WITH DIFFERENTIAL/PLATELET
Abs Immature Granulocytes: 0.1 10*3/uL — ABNORMAL HIGH (ref 0.00–0.07)
Basophils Absolute: 0 10*3/uL (ref 0.0–0.1)
Basophils Relative: 0 %
EOS PCT: 0 %
Eosinophils Absolute: 0 10*3/uL (ref 0.0–0.5)
HCT: 26.8 % — ABNORMAL LOW (ref 39.0–52.0)
Hemoglobin: 8.1 g/dL — ABNORMAL LOW (ref 13.0–17.0)
Immature Granulocytes: 1 %
LYMPHS PCT: 7 %
Lymphs Abs: 0.5 10*3/uL — ABNORMAL LOW (ref 0.7–4.0)
MCH: 27.1 pg (ref 26.0–34.0)
MCHC: 30.2 g/dL (ref 30.0–36.0)
MCV: 89.6 fL (ref 80.0–100.0)
Monocytes Absolute: 1.2 10*3/uL — ABNORMAL HIGH (ref 0.1–1.0)
Monocytes Relative: 16 %
Neutro Abs: 5.3 10*3/uL (ref 1.7–7.7)
Neutrophils Relative %: 76 %
Platelets: 374 10*3/uL (ref 150–400)
RBC: 2.99 MIL/uL — ABNORMAL LOW (ref 4.22–5.81)
RDW: 14.5 % (ref 11.5–15.5)
WBC: 7.1 10*3/uL (ref 4.0–10.5)
nRBC: 0 % (ref 0.0–0.2)

## 2018-09-11 LAB — URINALYSIS, ROUTINE W REFLEX MICROSCOPIC
Bilirubin Urine: NEGATIVE
GLUCOSE, UA: NEGATIVE mg/dL
Hgb urine dipstick: NEGATIVE
Ketones, ur: 20 mg/dL — AB
Leukocytes, UA: NEGATIVE
Nitrite: NEGATIVE
PH: 5 (ref 5.0–8.0)
PROTEIN: 30 mg/dL — AB
Specific Gravity, Urine: 1.021 (ref 1.005–1.030)

## 2018-09-11 LAB — BPAM RBC
Blood Product Expiration Date: 202002272359
Blood Product Expiration Date: 202002292359
ISSUE DATE / TIME: 202001261526
ISSUE DATE / TIME: 202001271718
UNIT TYPE AND RH: 5100
Unit Type and Rh: 5100

## 2018-09-11 LAB — TYPE AND SCREEN
ABO/RH(D): O POS
Antibody Screen: NEGATIVE
Unit division: 0
Unit division: 0

## 2018-09-11 MED ORDER — LIDOCAINE HCL 1 % IJ SOLN
INTRAMUSCULAR | Status: AC
  Filled 2018-09-11: qty 20

## 2018-09-11 MED ORDER — CEFAZOLIN SODIUM-DEXTROSE 2-4 GM/100ML-% IV SOLN
INTRAVENOUS | Status: AC
  Administered 2018-09-11: 2000 mg
  Filled 2018-09-11: qty 100

## 2018-09-11 MED ORDER — FENTANYL CITRATE (PF) 100 MCG/2ML IJ SOLN
INTRAMUSCULAR | Status: AC
  Filled 2018-09-11: qty 2

## 2018-09-11 MED ORDER — FENTANYL CITRATE (PF) 100 MCG/2ML IJ SOLN
INTRAMUSCULAR | Status: AC | PRN
  Administered 2018-09-11 (×2): 50 ug via INTRAVENOUS

## 2018-09-11 MED ORDER — NAPROXEN 250 MG PO TABS
250.0000 mg | ORAL_TABLET | Freq: Two times a day (BID) | ORAL | Status: DC
  Administered 2018-09-11 – 2018-09-13 (×4): 250 mg via ORAL
  Filled 2018-09-11 (×5): qty 1

## 2018-09-11 MED ORDER — MIDAZOLAM HCL 2 MG/2ML IJ SOLN
INTRAMUSCULAR | Status: AC
  Filled 2018-09-11: qty 2

## 2018-09-11 MED ORDER — MIDAZOLAM HCL 2 MG/2ML IJ SOLN
INTRAMUSCULAR | Status: AC | PRN
  Administered 2018-09-11 (×2): 1 mg via INTRAVENOUS

## 2018-09-11 MED ORDER — ALBUTEROL SULFATE (2.5 MG/3ML) 0.083% IN NEBU
2.5000 mg | INHALATION_SOLUTION | Freq: Once | RESPIRATORY_TRACT | Status: AC
  Administered 2018-09-11: 2.5 mg via RESPIRATORY_TRACT
  Filled 2018-09-11 (×2): qty 3

## 2018-09-11 MED ORDER — ZOLPIDEM TARTRATE 5 MG PO TABS
5.0000 mg | ORAL_TABLET | Freq: Every evening | ORAL | Status: DC | PRN
  Administered 2018-09-12 – 2018-09-13 (×2): 5 mg via ORAL
  Filled 2018-09-11 (×2): qty 1

## 2018-09-11 MED ORDER — ALBUTEROL SULFATE (2.5 MG/3ML) 0.083% IN NEBU: 2.5000 mg | INHALATION_SOLUTION | RESPIRATORY_TRACT | Status: DC | PRN

## 2018-09-11 NOTE — Progress Notes (Signed)
IP PROGRESS NOTE  Subjective:   Brian Ramirez complains of dyspnea.  He has fever and sweats.  He is scheduled for placement of a Pleurx today.  He was transfused 1 unit of packed red blood cells yesterday.  Objective: Vital signs in last 24 hours: Blood pressure 132/73, pulse (!) 102, temperature 98.6 F (37 C), temperature source Oral, resp. rate (!) 24, height 6\' 1"  (1.854 m), weight 155 lb 13.8 oz (70.7 kg), SpO2 98 %.  Intake/Output from previous day: 01/27 0701 - 01/28 0700 In: 2126.4 [I.V.:1248.4; Blood:328; IV Piggyback:550] Out: 275 [Urine:275]  Physical Exam:  Lungs: Diffuse wheezes, decreased breath sounds at the right lower chest Cardiac: Regular rate and rhythm Abdomen: No hepatosplenomegaly, nontender Extremities: No leg edema   Lab Results: Recent Labs    09/10/18 0516 09/10/18 2306 09/11/18 0539  WBC 8.6  --  7.1  HGB 7.1* 9.2* 8.1*  HCT 23.2* 30.2* 26.8*  PLT 328  --  374    BMET Recent Labs    09/10/18 0516  NA 131*  K 4.2  CL 99  CO2 25  GLUCOSE 88  BUN 11  CREATININE 0.71  CALCIUM 7.6*    No results found for: CEA1  Studies/Results: Dg Chest Port 1 View  Result Date: 09/11/2018 CLINICAL DATA:  Increased shortness of breath EXAM: PORTABLE CHEST 1 VIEW COMPARISON:  Two days ago FINDINGS: Progressive and large right pleural effusion with near right chest white out. There is leftward mediastinal shift. Stable patchy opacity in the left lung where there are nodules by CT. IMPRESSION: 1. Large and increased right pleural effusion with mild mediastinal shift. 2. Known nodularity in the left lung. Electronically Signed   By: Monte Fantasia M.D.   On: 09/11/2018 06:07   Dg Chest Port 1 View  Result Date: 09/09/2018 CLINICAL DATA:  Acute respiratory failure. EXAM: PORTABLE CHEST 1 VIEW COMPARISON:  Chest CT 09/07/2018 FINDINGS: Cardiomediastinal silhouette is normal. Mediastinal contours appear intact. Calcific atherosclerotic disease and  tortuosity of the aorta. There is no evidence of pneumothorax. Minimally decreased right pleural effusion, still large in volume. Ill-defined right upper lobe and left lower hemithorax pulmonary masses. Osseous structures are without acute abnormality. Soft tissues are grossly normal. IMPRESSION: Minimally decreased in volume large right pleural effusion. Bilateral pulmonary masses. Calcific atherosclerotic disease and tortuosity of the aorta. Electronically Signed   By: Fidela Salisbury M.D.   On: 09/09/2018 17:46    Medications: I have reviewed the patient's current medications.  Assessment/Plan: 1.  Large right pleural effusion, right lung mass, right pleural-based masses, left lung nodules  Right thoracentesis 09/04/2018-negative cytology  CT biopsy of right lateral pleural mass 09/07/2018-pathology pending  2.  Severe anemia-likely secondary to bleeding in the right pleural space and metastatic carcinoma, red cell transfusions 09/09/2018 09/10/2018 3.  Dyspnea secondary to the large right pleural effusion and anemia 4.  History of tobacco use 5.  History of kidney stones 6.  Fever   Brian Ramirez appears unchanged.  He continues to have dyspnea, likely secondary to the large right pleural effusion and anemia.  He is scheduled to undergo a thoracentesis and placement of a Pleurx catheter today.  He has intermittent high fever, likely tumor fever.  I was suspicious of lymphoma, but the [facility] returned normal.  I reviewed the pathology slides from the 09/07/2018 biopsy with Dr. Lyndon Code.  Diagnostic material is present and the histology is consistent with a malignancy.  The initial immunohistochemical stains are negative.  He  is performing additional immunohistochemical stains today.  I discussed the preliminary pathology with Brian Ramirez.  Recommendations: 1.  Right thoracentesis and placement of Pleurx catheter 2.  Transfuse packed red blood cells for further drop in the hemoglobin 3.  Begin  trial of naproxen for the fever 4.  I will follow-up on the final pathology report and make treatment recommendations.   LOS: 6 days   Betsy Coder, MD   09/11/2018, 2:10 PM

## 2018-09-11 NOTE — Progress Notes (Signed)
PT Cancellation Note  Patient Details Name: Brian Ramirez MRN: [id number] DOB: [birth year]   Cancelled Treatment:    Reason Eval/Treat Not Completed: Medical issues which prohibited therapy. Pt politely decline PT stating he was "huffing and puffing" in bed.  He reports he is getting fluid drained later today and is hoping tomorrow he will be able to breathe better and work with therapy.   Galen Manila 09/11/2018, 1:13 PM

## 2018-09-11 NOTE — Plan of Care (Signed)
  Problem: Clinical Measurements: Goal: Ability to maintain clinical measurements within normal limits will improve Outcome: Progressing Goal: Diagnostic test results will improve Outcome: Progressing Goal: Respiratory complications will improve Outcome: Progressing   Problem: Activity: Goal: Risk for activity intolerance will decrease Outcome: Progressing

## 2018-09-11 NOTE — Progress Notes (Signed)
OT Cancellation Note  Patient Details Name: KEISHAWN RAJEWSKI MRN: [id number] DOB: 13-Mar-1946   Cancelled Treatment:    Reason Eval/Treat Not Completed: Other (comment). Labored breathing without activity. Will check another day.  Jaylenn Altier 09/11/2018, 1:20 PM  Lesle Chris, OTR/L Acute Rehabilitation Services [phone] [facility] pager [phone] office 09/11/2018

## 2018-09-11 NOTE — Progress Notes (Signed)
PROGRESS NOTE  HAMDAN TOSCANO  CNO:[id number] DOB: 1946/04/19 DOA: 09/04/2018 PCP: [name], MD   Brief Narrative: Brian Ramirez is a 73 y.o. male with medical history significant of hernia, prior tobacco abuse presents to ED with gradually worsening sob over the past month. Symptoms began as generalized malaise around Christmas 2019 which progressed to worsening SOB. Patient reports sob worsened to where pt was unable to ambulate even 3-5 feet, requiring 20-30 min to catch his breath. Pt is previously very active and exercises and lifts weights regularly. Since feeling SOB, pt has been unable to remain active. Pt has otherwise denied florid wt loss but reports decreased appetite and feeling dehydrated. Pt states he was a former smoker, quit 27yrs ago  ED Course: In the ED, pt underwent CTA chest with findings of two R sided lung masses, measuring 4.6cm and 2.6cm with large R effusion. Pt since underwent IR guided thoracentesis. While getting procedure, Hospitalist consulted for consideration for admission.   Hospital Course: Cytology returned without malignant cells identified. IR was consulted for tissue biopsy and repeat thoracentesis which yielded 3 more liters bloody fluid. Leukocytosis continued and infiltrate appears to be developing, so antibiotics started. Fevers have continued. Biopsy was sent to pathology and oncology consulted. There is suspicion for malignancy though remains not otherwise specified. He has required transfusions of PRBCs and supplemental oxygen with evidence of rapid reaccumulation of bloody effusion. Pleurx catheter placement is scheduled 09/11/2018.  Assessment & Plan: Principal Problem:   Pleural effusion Active Problems:   Lung mass   Mass of right lung  Acute hypoxic respiratory failure: Due to effusion and primary lung lesions, also possible developing pneumonia. - Continue supplemental oxygen to maintain SpO2 >90%.  - Wheezing on exam 1/28, will  give nebulizer Tx.  Left perihilar CAP:  - Started unasyn, mucus/sputum production increased from baseline. To include pseudomonal coverage, transitioned to zosyn. Vancomycin given but stopped with negative MRSA. - Sputum culture sent, pending, reincubated.    - Give tylenol, naproxen prn. No new infiltrates on CXR, evidence of wound infection on exam, or symptoms of UTI to otherwise explain fever. Hematopoetic malignancy on DDx though LDH normal.  Large right-sided pleural effusion: In setting of right lung masses, suspect malignant etiology. Exudative by light's criteria, grossly bloody. Abundant WBCs on gram stain, no culture positivity. Cytology negative.  - Discussed with IR 1/27, will perform chest tube/pleurx catheter placement today.  Lung masses in RUL x2 (4.6cm, 2.6cm) as well as likely metastases on the left and right hilum, subcarinal lymph nodes and pleura seen on CT imaging. Former smoker.  - s/p CT-guided pleural mass biopsy 1/24, awaiting pathology. Oncology, Dr. [name] consulted, still pending further IHC stains.  - Note, he is a poor bronchoscopy candidate due to previous jaw fracture, limited mobility.  AKI: Uncertain baseline, but CrCl has normalized >13ml/min from Cr 1.33 on admission to 0.97.  - Recheck in AM  Anemia of chronic disease and acute blood loss anemia: Ferritin high, TIBC low.  - Hgb 7.3 > 7.1 despite 1u PRBCs 1/26, repeated 1u PRBCs 1/27 with rise to 9.2 followed by drop to 8.1. Recheck CBC 1/29, suggested transfusion threshold being < 8g/dl.  Dehydration: Improved, taking po. - Restarted IV fluids with ~1L/d effusion accumulation.  Remote tobacco use:  - Noted.   Right paraspinal lumbar muscle strain: Due to laying in bed most likely. No red flag features. Improving.  - Trial tylenol/NSAID, heating pad. Improved.  DVT prophylaxis: SCDs Code Status:  Full Family Communication: None at bedside Disposition Plan: Anticipate DC home after stabilization  of respiratory status.   Consultants:   Interventional radiology  Oncology  Procedures:   Thoracentesis 09/04/2018, 09/07/2018  CT-guided needle biopsy 09/07/2018  Pleurx catheter placement requested  Antimicrobials:  Levaquin 1/21 x1  Unasyn 1/23 - 1/27  Zosyn 1/27 >>  Vancomycin 1/27 x1  Subjective: Fevers continue, feels more short of breath today than previously. Having dyspnea at rest more than previous. Mucus, cough also severe. Denies chest pain.  Objective: Vitals:   09/11/18 0546 09/11/18 1030 09/11/18 1129 09/11/18 1300  BP: 136/78 111/76  132/73  Pulse: (!) 115 (!) 103  (!) 102  Resp: 17 (!) 24  (!) 24  Temp: (!) 102.3 F (39.1 C) 98.1 F (36.7 C)  98.6 F (37 C)  TempSrc: Oral Oral  Oral  SpO2: 93% 98% 91% 98%  Weight:      Height:        Intake/Output Summary (Last 24 hours) at 09/11/2018 1528 Last data filed at 09/11/2018 1300 Gross per 24 hour  Intake 709.32 ml  Output 275 ml  Net 434.32 ml   Filed Weights   09/04/18 1314 09/04/18 1851  Weight: 74.8 kg 70.7 kg   Gen: 73 y.o. male in no distress Pulm: Labored tachypnea with supplemental oxyge. Diminished almost completely on right, scant expiratory wheezing in upper field and on left. CV: Regular tachycardia. No murmur, rub, or gallop. No JVD, no dependent edema. GI: Abdomen soft, non-tender, non-distended, with normoactive bowel sounds.  Ext: Warm, no deformities Skin: No new rashes, lesions or ulcers on visualized skin. Neuro: Alert and oriented. No focal neurological deficits. Psych: Judgement and insight appear fair. Mood euthymic & affect congruent. Behavior is appropriate.    Data Reviewed: I have personally reviewed following labs and imaging studies  CBC: Recent Labs  Lab 09/07/18 0540 09/08/18 0541 09/09/18 0513 09/10/18 0516 09/10/18 2306 09/11/18 0539  WBC 11.8* 12.8* 11.8* 8.6  --  7.1  NEUTROABS  --   --   --   --   --  5.3  HGB 8.1* 7.7* 7.3* 7.1* 9.2* 8.1*  HCT  27.1* 25.1* 24.2* 23.2* 30.2* 26.8*  MCV 94.4 90.0 92.0 90.6  --  89.6  PLT 367 441* 353 328  --  740   Basic Metabolic Panel: Recent Labs  Lab 09/05/18 0459 09/07/18 0540 09/10/18 0516  NA 133* 134* 131*  K 4.6 4.6 4.2  CL 100 99 99  CO2 25 25 25   GLUCOSE 112* 117* 88  BUN 21 15 11   CREATININE 0.97 0.73 0.71  CALCIUM 8.4* 8.4* 7.6*   GFR: Estimated Creatinine Clearance: 84.7 mL/min (by C-G formula based on SCr of 0.71 mg/dL). Liver Function Tests: Recent Labs  Lab 09/05/18 0459  AST 28  ALT 24  ALKPHOS 57  BILITOT 0.8  PROT 6.0*  ALBUMIN 2.2*   Cardiac Enzymes: No results for input(s): CKTOTAL, CKMB, CKMBINDEX, TROPONINI in the last 168 hours. Recent Results (from the past 240 hour(s))  Blood culture (routine x 2)     Status: None   Collection Time: 09/04/18  3:01 PM  Result Value Ref Range Status   Specimen Description   Final    BLOOD RIGHT HAND Performed at [place] [facility] [address], [place], [place] 04/26/1946    Special Requests   Final    BOTTLES DRAWN AEROBIC AND ANAEROBIC Blood Culture adequate volume Performed at [facility], [address] [place], [place] [address]    Culture   Final    NO GROWTH 5 DAYS Performed at [facility], [place] [address], [place], [place] [address]    Report Status 09/09/2018 FINAL  Final  Body fluid culture     Status: None   Collection Time: 09/04/18  4:58 PM  Result Value Ref Range Status   Specimen Description   Final    PLEURAL RIGHT Performed at [place] [address] [place], [place] [address]    Special Requests   Final    NONE Performed at [facility], [place] [address] [place], [place] [address]    Gram Stain   Final    ABUNDANT WBC PRESENT,BOTH PMN AND MONONUCLEAR NO ORGANISMS SEEN    Culture   Final    NO GROWTH 3 DAYS Performed at [facility], [place] [address] [place], [place] [address]    Report Status  09/08/2018 FINAL  Final  Blood culture (routine x 2)     Status: None   Collection Time: 09/04/18  7:46 PM  Result Value Ref Range Status   Specimen Description   Final    BLOOD RIGHT ANTECUBITAL Performed at [facility] [address], [place], [place] [address]    Special Requests   Final    BOTTLES DRAWN AEROBIC AND ANAEROBIC Blood Culture adequate volume Performed at [facility] [address] [place], [place] [address]    Culture   Final    NO GROWTH 5 DAYS Performed at [facility], [place] [address], [place], [place] [address]    Report Status 09/09/2018 FINAL  Final  Expectorated sputum assessment w rflx to resp cult     Status: None   Collection Time: 09/10/18  9:07 AM  Result Value Ref Range Status   Specimen Description SPUTUM  Final   Special Requests NONE  Final   Sputum evaluation   Final    THIS SPECIMEN IS ACCEPTABLE FOR SPUTUM CULTURE Performed at [facility], [place] [address] [place], [place] [address]    Report Status 09/10/2018 FINAL  Final  Culture, respiratory     Status: None (Preliminary result)   Collection Time: 09/10/18  9:07 AM  Result Value Ref Range Status   Specimen Description   Final    SPUTUM Performed at [facility] [address] [place], [place] [address]    Special Requests   Final    NONE Reflexed from [id number] Performed at [facility], [place] [address], [place], [place] [address]    Gram Stain   Final    RARE SQUAMOUS EPITHELIAL CELLS PRESENT ABUNDANT WBC PRESENT,BOTH PMN AND MONONUCLEAR FEW GRAM POSITIVE COCCI FEW GRAM POSITIVE RODS FEW GRAM NEGATIVE RODS    Culture   Final    CULTURE REINCUBATED FOR BETTER GROWTH Performed at [facility], [place] [address] [place], [place] [address]    Report Status PENDING  Incomplete  MRSA PCR Screening     Status: None   Collection Time: 09/10/18 12:04 PM  Result Value Ref Range  Status   MRSA by PCR NEGATIVE NEGATIVE Final    Comment:        The GeneXpert MRSA Assay (FDA approved for NASAL specimens only), is one component of a comprehensive MRSA colonization surveillance program. It is not intended to diagnose MRSA infection nor to guide or monitor treatment  for MRSA infections. Performed at [facility], [place] [address], [place], [place] [address]       Radiology Studies: Dg Chest Port 1 View  Result Date: 09/11/2018 CLINICAL DATA:  Increased shortness of breath EXAM: PORTABLE CHEST 1 VIEW COMPARISON:  Two days ago FINDINGS: Progressive and large right pleural effusion with near right chest white out. There is leftward mediastinal shift. Stable patchy opacity in the left lung where there are nodules by CT. IMPRESSION: 1. Large and increased right pleural effusion with mild mediastinal shift. 2. Known nodularity in the left lung. Electronically Signed   By: Monte Fantasia M.D.   On: 09/11/2018 06:07   Dg Chest Port 1 View  Result Date: 09/09/2018 CLINICAL DATA:  Acute respiratory failure. EXAM: PORTABLE CHEST 1 VIEW COMPARISON:  Chest CT 09/07/2018 FINDINGS: Cardiomediastinal silhouette is normal. Mediastinal contours appear intact. Calcific atherosclerotic disease and tortuosity of the aorta. There is no evidence of pneumothorax. Minimally decreased right pleural effusion, still large in volume. Ill-defined right upper lobe and left lower hemithorax pulmonary masses. Osseous structures are without acute abnormality. Soft tissues are grossly normal. IMPRESSION: Minimally decreased in volume large right pleural effusion. Bilateral pulmonary masses. Calcific atherosclerotic disease and tortuosity of the aorta. Electronically Signed   By: Fidela Salisbury M.D.   On: 09/09/2018 17:46    Scheduled Meds: . sodium chloride   Intravenous Once  . mouth rinse  15 mL Mouth Rinse BID  . naproxen  250 mg Oral BID WC   Continuous Infusions: . sodium  chloride 110 mL/hr at 09/11/18 0843  . piperacillin-tazobactam (ZOSYN)  IV 3.375 g (09/11/18 1143)     LOS: 6 days   Time spent: 35 minutes.  Patrecia Pour, MD [facility] [email or web address] Password [facility] 09/11/2018, 3:28 PM

## 2018-09-11 NOTE — Procedures (Signed)
Malignant rt pleural eff  S/p fluoro and US guided Rt tunneled pleural drain(PleurX)  1.2 L rt thora performed after insertion  Bloody pleural fld removed No comp Stable ebl min Use prn Full report in pacs

## 2018-09-11 NOTE — Plan of Care (Signed)
Pt stable though pt does remain short of breath with minimal activity, including long discussions. Rn encouraged pt to conserve his energy and breathing. Pt did also report mild pain at chest tube site with coughing, though he stated it was not not worrisome to him. No other needs to report. No signs of distress after pt relaxed and took good deep breaths. No changes needed to care plans.

## 2018-09-12 ENCOUNTER — Other Ambulatory Visit: Payer: Self-pay | Admitting: *Deleted

## 2018-09-12 DIAGNOSIS — R918 Other nonspecific abnormal finding of lung field: Secondary | ICD-10-CM

## 2018-09-12 LAB — CULTURE, RESPIRATORY W GRAM STAIN: Culture: NORMAL

## 2018-09-12 LAB — CBC
HCT: 29 % — ABNORMAL LOW (ref 39.0–52.0)
Hemoglobin: 8.7 g/dL — ABNORMAL LOW (ref 13.0–17.0)
MCH: 27.2 pg (ref 26.0–34.0)
MCHC: 30 g/dL (ref 30.0–36.0)
MCV: 90.6 fL (ref 80.0–100.0)
Platelets: 394 10*3/uL (ref 150–400)
RBC: 3.2 MIL/uL — ABNORMAL LOW (ref 4.22–5.81)
RDW: 14.4 % (ref 11.5–15.5)
WBC: 7.6 10*3/uL (ref 4.0–10.5)
nRBC: 0 % (ref 0.0–0.2)

## 2018-09-12 LAB — BASIC METABOLIC PANEL
Anion gap: 6 (ref 5–15)
BUN: 12 mg/dL (ref 8–23)
CO2: 26 mmol/L (ref 22–32)
Calcium: 7.8 mg/dL — ABNORMAL LOW (ref 8.9–10.3)
Chloride: 104 mmol/L (ref 98–111)
Creatinine, Ser: 0.63 mg/dL (ref 0.61–1.24)
GFR calc Af Amer: >60 mL/min (ref >60)
GFR calc non Af Amer: >60 mL/min (ref >60)
Glucose, Bld: 103 mg/dL — ABNORMAL HIGH (ref 70–99)
Potassium: 3.9 mmol/L (ref 3.5–5.1)
SODIUM: 136 mmol/L (ref 135–145)

## 2018-09-12 NOTE — Progress Notes (Signed)
PROGRESS NOTE    Brian Ramirez  TKW:[id number] DOB: 1945-09-21 DOA: 09/04/2018 PCP: [name], MD    Brief Narrative:  73 y.o.malewith medical history significant ofhernia, prior tobacco abuse presents to ED with gradually worsening sob over the past month.Symptoms began as generalized malaise around Christmas 2019 which progressed to worsening SOB. Patient reports sob worsened to where pt was unable to ambulate even 3-5 feet, requiring 20-30 min to catch his breath. Pt is previously very active and exercises and lifts weights regularly. Since feeling SOB, pt has been unable to remain active. Pt has otherwise denied florid wt loss but reports decreased appetite and feeling dehydrated.Pt states he was a former smoker, quit 40yrs ago  ED Course:In the ED, pt underwent CTA chest with findings of two R sided lung masses, measuring 4.6cm and 2.6cm with large R effusion. Pt since underwent IR guided thoracentesis. While getting procedure, Hospitalist consulted for consideration for admission.   Assessment & Plan:   Principal Problem:   Pleural effusion Active Problems:   Lung mass   Mass of right lung  Acute hypoxic respiratory failure: Due to effusion and primary lung lesions, also possible developing pneumonia. - Continue supplemental oxygen to maintain SpO2 >90%.  - Wheezing on exam 1/28, no significant improvement with nebulizer Tx. - coarse breath sounds on exam with mucus production. Will give trial of flutter valve  Left perihilar CAP:  - Started unasyn, mucus/sputum production increased from baseline. To include pseudomonal coverage, transitioned to zosyn. Vancomycin given but stopped with negative MRSA. - Sputum culture sent, pending, reincubated.    - Give tylenol, naproxen prn. No new infiltrates on CXR, evidence of wound infection on exam, or symptoms of UTI to otherwise explain fever. Hematopoetic malignancy on DDx though LDH normal. - Would complete course of  abx, zosyn currently, likely transition to augmentin if stable in AM  Large right-sided pleural effusion: In setting of right lung masses, suspect malignant etiology. Exudative by light's criteria, grossly bloody. Abundant WBCs on gram stain, no culture positivity. Cytology negative.  - Dr. Bonner Puna had discussed with IR 1/27, s/p chest tube/pleurx catheter placement 1/28  Lung masses in RUL x2 (4.6cm, 2.6cm) as well as likely metastases on the left and right hilum, subcarinal lymph nodes and pleura seen on CT imaging. Former smoker.  - s/p CT-guided pleural mass biopsy 1/24 - Note, he is a poor bronchoscopy candidate due to previous jaw fracture, limited mobility. -Discussed with Oncology. Biopsy pos for malignancy. See Oncology note  AKI: Uncertain baseline, but CrCl has normalized >46ml/min from Cr 1.33 on admission to 0.97.  - Repeat BMET in AM  Anemia of chronic disease and acute blood loss anemia: Ferritin high, TIBC low.  - Hgb 7.3 > 7.1 despite 1u PRBCs 1/26, repeated 1u PRBCs 1/27 with rise to 9.2 followed by drop to 8.1. Recheck CBC 1/29, suggested transfusion threshold being < 8g/dl.  Dehydration: Improved, taking po. - Restarted IV fluids with ~1L/d effusion accumulation. - Currently stable  Remote tobacco use:  - Noted.   Right paraspinal lumbar muscle strain: Due to laying in bed most likely. No red flag features. Improving.  - Trial tylenol/NSAID, heating pad.  - Stable  DVT prophylaxis: SCD's Code Status: Full Family Communication: Pt in room, family not at bedside Disposition Plan: Uncertain at this time  Consultants:   Oncology  IR  Procedures:   Pleurix cath 1/28  Antimicrobials: Anti-infectives (From admission, onward)   Start     Dose/Rate Route Frequency  Ordered Stop   09/11/18 1639  ceFAZolin (ANCEF) 2-4 GM/100ML-% IVPB    Note to Pharmacy:  Hilma Favors   : cabinet override      09/11/18 1639 09/11/18 1632   09/11/18 0000  vancomycin  (VANCOCIN) IVPB 1000 mg/200 mL premix  Status:  Discontinued     1,000 mg 200 mL/hr over 60 Minutes Intravenous Every 12 hours 09/10/18 1208 09/10/18 1338   09/10/18 1200  piperacillin-tazobactam (ZOSYN) IVPB 3.375 g     3.375 g 12.5 mL/hr over 240 Minutes Intravenous Every 8 hours 09/10/18 1112     09/10/18 1115  vancomycin (VANCOCIN) 1,500 mg in sodium chloride 0.9 % 500 mL IVPB  Status:  Discontinued     1,500 mg 250 mL/hr over 120 Minutes Intravenous  Once 09/10/18 1112 09/10/18 1436   09/07/18 1654  Ampicillin-Sulbactam (UNASYN) 3 g in sodium chloride 0.9 % 100 mL IVPB  Status:  Discontinued     3 g 200 mL/hr over 30 Minutes Intravenous Every 6 hours 09/07/18 1103 09/10/18 1112   09/06/18 1800  Ampicillin-Sulbactam (UNASYN) 3 g in sodium chloride 0.9 % 100 mL IVPB  Status:  Discontinued     3 g 200 mL/hr over 30 Minutes Intravenous Every 8 hours 09/06/18 1659 09/07/18 1103   09/04/18 1415  levofloxacin (LEVAQUIN) IVPB 750 mg     750 mg 100 mL/hr over 90 Minutes Intravenous  Once 09/04/18 1406 09/04/18 1644       Subjective: Coughing with mucus  Objective: Vitals:   09/11/18 1738 09/11/18 2139 09/12/18 0621 09/12/18 1347  BP: 125/81 122/74 129/79 123/70  Pulse: (!) 107 100 (!) 106 (!) 107  Resp: 18 16 19 20   Temp: 98.1 F (36.7 C) 99.3 F (37.4 C) 98.2 F (36.8 C) 98 F (36.7 C)  TempSrc: Oral Oral Oral Oral  SpO2: 94% 94% 95% 97%  Weight:      Height:        Intake/Output Summary (Last 24 hours) at 09/12/2018 1835 Last data filed at 09/12/2018 1737 Gross per 24 hour  Intake 2458.45 ml  Output 1225 ml  Net 1233.45 ml   Filed Weights   09/04/18 1314 09/04/18 1851  Weight: 74.8 kg 70.7 kg    Examination:  General exam: Appears calm and comfortable  Respiratory system: Coarse breath sounds,. Respiratory effort normal. Cardiovascular system: S1 & S2 heard, RRR Gastrointestinal system: Abdomen is nondistended, soft and nontender. No organomegaly or masses felt.  Normal bowel sounds heard. Central nervous system: Alert and oriented. No focal neurological deficits. Extremities: Symmetric 5 x 5 power. Skin: No rashes, lesions  Psychiatry: Judgement and insight appear normal. Mood & affect appropriate.   Data Reviewed: I have personally reviewed following labs and imaging studies  CBC: Recent Labs  Lab 09/08/18 0541 09/09/18 0513 09/10/18 0516 09/10/18 2306 09/11/18 0539 09/12/18 1127  WBC 12.8* 11.8* 8.6  --  7.1 7.6  NEUTROABS  --   --   --   --  5.3  --   HGB 7.7* 7.3* 7.1* 9.2* 8.1* 8.7*  HCT 25.1* 24.2* 23.2* 30.2* 26.8* 29.0*  MCV 90.0 92.0 90.6  --  89.6 90.6  PLT 441* 353 328  --  374 161   Basic Metabolic Panel: Recent Labs  Lab 09/07/18 0540 09/10/18 0516 09/12/18 0630  NA 134* 131* 136  K 4.6 4.2 3.9  CL 99 99 104  CO2 25 25 26   GLUCOSE 117* 88 103*  BUN 15 11 12   CREATININE  0.73 0.71 0.63  CALCIUM 8.4* 7.6* 7.8*   GFR: Estimated Creatinine Clearance: 84.7 mL/min (by C-G formula based on SCr of 0.63 mg/dL). Liver Function Tests: No results for input(s): AST, ALT, ALKPHOS, BILITOT, PROT, ALBUMIN in the last 168 hours. No results for input(s): LIPASE, AMYLASE in the last 168 hours. No results for input(s): AMMONIA in the last 168 hours. Coagulation Profile: Recent Labs  Lab 09/07/18 0540  INR 1.03   Cardiac Enzymes: No results for input(s): CKTOTAL, CKMB, CKMBINDEX, TROPONINI in the last 168 hours. BNP (last 3 results) No results for input(s): PROBNP in the last 8760 hours. HbA1C: No results for input(s): HGBA1C in the last 72 hours. CBG: No results for input(s): GLUCAP in the last 168 hours. Lipid Profile: No results for input(s): CHOL, HDL, LDLCALC, TRIG, CHOLHDL, LDLDIRECT in the last 72 hours. Thyroid Function Tests: No results for input(s): TSH, T4TOTAL, FREET4, T3FREE, THYROIDAB in the last 72 hours. Anemia Panel: No results for input(s): VITAMINB12, FOLATE, FERRITIN, TIBC, IRON, RETICCTPCT in the  last 72 hours. Sepsis Labs: No results for input(s): PROCALCITON, LATICACIDVEN in the last 168 hours.  Recent Results (from the past 240 hour(s))  Blood culture (routine x 2)     Status: None   Collection Time: 09/04/18  3:01 PM  Result Value Ref Range Status   Specimen Description   Final    BLOOD RIGHT HAND Performed at [facility] [address], [place], [place] [address]    Special Requests   Final    BOTTLES DRAWN AEROBIC AND ANAEROBIC Blood Culture adequate volume Performed at [facility] [address], [place], [place] [address]    Culture   Final    NO GROWTH 5 DAYS Performed at [facility], [place] [address] [place], [place] [address]    Report Status 09/09/2018 FINAL  Final  Body fluid culture     Status: None   Collection Time: 09/04/18  4:58 PM  Result Value Ref Range Status   Specimen Description   Final    PLEURAL RIGHT Performed at [facility] [address], [place], [place] [address]    Special Requests   Final    NONE Performed at [facility], [place] [address] [place], [place] [address]    Gram Stain   Final    ABUNDANT WBC PRESENT,BOTH PMN AND MONONUCLEAR NO ORGANISMS SEEN    Culture   Final    NO GROWTH 3 DAYS Performed at [facility], [place] [address] [place], [place] [address]    Report Status 09/08/2018 FINAL  Final  Blood culture (routine x 2)     Status: None   Collection Time: 09/04/18  7:46 PM  Result Value Ref Range Status   Specimen Description   Final    BLOOD RIGHT ANTECUBITAL Performed at [facility] [address], [place], [place] [address]    Special Requests   Final    BOTTLES DRAWN AEROBIC AND ANAEROBIC Blood Culture adequate volume Performed at [facility] [address], [place], [place] [address]    Culture   Final    NO GROWTH 5 DAYS Performed at [facility], [place] [address],  [place],  [address]    Report Status 09/09/2018 FINAL  Final  Expectorated sputum assessment w rflx to resp cult     Status: None   Collection Time: 09/10/18  9:07 AM  Result Value  Ref Range Status   Specimen Description SPUTUM  Final   Special Requests NONE  Final   Sputum evaluation   Final    THIS SPECIMEN IS ACCEPTABLE FOR SPUTUM CULTURE Performed at [facility], [place], [place] [address] [place], [place] [address]    Report Status 09/10/2018 FINAL  Final  Culture, respiratory     Status: None   Collection Time: 09/10/18  9:07 AM  Result Value Ref Range Status   Specimen Description   Final    SPUTUM Performed at [facility] [address] [place], [place] [address]    Special Requests   Final    NONE Reflexed from [id number] Performed at [facility], [place] [address], [place], [place] [address]    Gram Stain   Final    RARE SQUAMOUS EPITHELIAL CELLS PRESENT ABUNDANT WBC PRESENT,BOTH PMN AND MONONUCLEAR FEW GRAM POSITIVE COCCI FEW GRAM POSITIVE RODS FEW GRAM NEGATIVE RODS    Culture   Final    FEW Consistent with normal respiratory flora. Performed at [facility], [place] [address] [place], [place] [address]    Report Status 09/12/2018 FINAL  Final  MRSA PCR Screening     Status: None   Collection Time: 09/10/18 12:04 PM  Result Value Ref Range Status   MRSA by PCR NEGATIVE NEGATIVE Final    Comment:        The GeneXpert MRSA Assay (FDA approved for NASAL specimens only), is one component of a comprehensive MRSA colonization surveillance program. It is not intended to diagnose MRSA infection nor to guide or monitor treatment for MRSA infections. Performed at [facility], [place] [address], [place], [place] [address]   Culture, blood (routine x 2)     Status: None (Preliminary result)   Collection Time: 09/11/18  7:01 AM  Result Value Ref Range Status   Specimen Description   Final    BLOOD  RIGHT HAND Performed at [facility] [address] [place], [place] [address]    Special Requests   Final    BOTTLES DRAWN AEROBIC AND ANAEROBIC Blood Culture adequate volume Performed at [facility] [address] [place], [place] [address]    Culture   Final    NO GROWTH < 24 HOURS Performed at [facility] [address], [place], [place] [address]    Report Status PENDING  Incomplete  Culture, blood (routine x 2)     Status: None (Preliminary result)   Collection Time: 09/11/18  7:01 AM  Result Value Ref Range Status   Specimen Description   Final    BLOOD LEFT HAND Performed at [facility] [address], [place], [place] [address]    Special Requests   Final    BOTTLES DRAWN AEROBIC AND ANAEROBIC Blood Culture adequate volume Performed at [facility] [address] [place], [place] [address]    Culture   Final    NO GROWTH < 24 HOURS Performed at [facility] [address] [place], [place] [address]    Report Status PENDING  Incomplete     Radiology Studies: Dg Chest Port 1 View  Result Date: 09/11/2018 CLINICAL DATA:  Increased shortness of breath EXAM: PORTABLE CHEST 1 VIEW COMPARISON:  Two days ago FINDINGS: Progressive and large right pleural effusion with near right chest white out. There is leftward mediastinal shift. Stable patchy opacity in the left lung  where there are nodules by CT. IMPRESSION: 1. Large and increased right pleural effusion with mild mediastinal shift. 2. Known nodularity in the left lung. Electronically Signed   By: Monte Fantasia M.D.   On: 09/11/2018 06:07   Ir Perc Pleural Drain W/[place]well Cath W/img Guide  Result Date: 09/11/2018 INDICATION: Malignant right pleural effusion EXAM: ULTRASOUND AND FLUOROSCOPIC RIGHT CHEST TUNNELED PLEURAL DRAIN (PLEURX CATHETER) MEDICATIONS: The patient is currently admitted to the hospital and receiving  intravenous antibiotics. The antibiotics were administered within an appropriate time frame prior to the initiation of the procedure. ANESTHESIA/SEDATION: Fentanyl 100 mcg IV; Versed 2.0 mg IV Moderate Sedation Time:  11 MINUTES The patient was continuously monitored during the procedure by the interventional radiology nurse under my direct supervision. COMPLICATIONS: None immediate. PROCEDURE: Informed written consent was obtained from the patient after a thorough discussion of the procedural risks, benefits and alternatives. All questions were addressed. Maximal Sterile Barrier Technique was utilized including caps, mask, sterile gowns, sterile gloves, sterile drape, hand hygiene and skin antiseptic. A timeout was performed prior to the initiation of the procedure. previous imaging reviewed. preliminary ultrasound performed. the complex loculated right pleural effusion was marked in the mid axillary line through a lower intercostal space. under sterile conditions and local anesthesia, ultrasound percutaneous access performed of the right chest. needle position confirmed with ultrasound. images obtained for documentation. amplatz guidewire inserted. the pleurx catheter was tunneled subcutaneously to the puncture site and advanced into the chest through a peel-away sheath. position confirmed with ultrasound and fluoroscopy. 1.2 l thoracentesis performed following insertion. catheter secured with prolene suture. entry site closed with subcuticular vicryl suture and dermabond. no immediate complication. patient tolerated the procedure well. IMPRESSION: Successful ultrasound fluoroscopic right chest tunneled pleural drain (PleurX catheter). 1.2 L thoracentesis performed after insertion. Electronically Signed   By: Jerilynn Mages.  Shick M.D.   On: 09/11/2018 17:30    Scheduled Meds: . mouth rinse  15 mL Mouth Rinse BID  . naproxen  250 mg Oral BID WC   Continuous Infusions: . sodium chloride 75 mL/hr at 09/12/18 1733  .  piperacillin-tazobactam (ZOSYN)  IV 3.375 g (09/12/18 1339)     LOS: 7 days   Marylu Lund, MD [facility] Pager On Amion  If 7PM-7AM, please contact night-coverage 09/12/2018, 6:35 PM

## 2018-09-12 NOTE — Progress Notes (Signed)
IP PROGRESS NOTE  Subjective:   Brian Ramirez underwent placement of a Pleurx catheter yesterday.  He continues to have exertional dyspnea.  The fever has improved. Objective: Vital signs in last 24 hours: Blood pressure 132/73, pulse (!) 102, temperature 98.6 F (37 C), temperature source Oral, resp. rate (!) 24, height 6\' 1"  (1.854 m), weight 155 lb 13.8 oz (70.7 kg), SpO2 98 %.  Intake/Output from previous day: 01/27 0701 - 01/28 0700 In: 2126.4 [I.V.:1248.4; Blood:328; IV Piggyback:550] Out: 275 [Urine:275]  Physical Exam: Not performed today  Lab Results: Recent Labs    09/10/18 0516 09/10/18 2306 09/11/18 0539  WBC 8.6  --  7.1  HGB 7.1* 9.2* 8.1*  HCT 23.2* 30.2* 26.8*  PLT 328  --  374    BMET Recent Labs    09/10/18 0516  NA 131*  K 4.2  CL 99  CO2 25  GLUCOSE 88  BUN 11  CREATININE 0.71  CALCIUM 7.6*    No results found for: CEA1  Studies/Results: Dg Chest Port 1 View  Result Date: 09/11/2018 CLINICAL DATA:  Increased shortness of breath EXAM: PORTABLE CHEST 1 VIEW COMPARISON:  Two days ago FINDINGS: Progressive and large right pleural effusion with near right chest white out. There is leftward mediastinal shift. Stable patchy opacity in the left lung where there are nodules by CT. IMPRESSION: 1. Large and increased right pleural effusion with mild mediastinal shift. 2. Known nodularity in the left lung. Electronically Signed   By: Monte Fantasia M.D.   On: 09/11/2018 06:07   Dg Chest Port 1 View  Result Date: 09/09/2018 CLINICAL DATA:  Acute respiratory failure. EXAM: PORTABLE CHEST 1 VIEW COMPARISON:  Chest CT 09/07/2018 FINDINGS: Cardiomediastinal silhouette is normal. Mediastinal contours appear intact. Calcific atherosclerotic disease and tortuosity of the aorta. There is no evidence of pneumothorax. Minimally decreased right pleural effusion, still large in volume. Ill-defined right upper lobe and left lower hemithorax pulmonary masses. Osseous  structures are without acute abnormality. Soft tissues are grossly normal. IMPRESSION: Minimally decreased in volume large right pleural effusion. Bilateral pulmonary masses. Calcific atherosclerotic disease and tortuosity of the aorta. Electronically Signed   By: Fidela Salisbury M.D.   On: 09/09/2018 17:46    Medications: I have reviewed the patient's current medications.  Assessment/Plan: 1.  Large right pleural effusion, right lung mass, right pleural-based masses, left lung nodules  Right thoracentesis 09/04/2018-negative cytology  CT biopsy of right lateral pleural mass 09/07/2018- poorly differentiated non-small cell carcinoma, malignant cells positive for cytokeratin AE1/AE3 and cytokeratin 8/18, histology favoring adenocarcinoma  2.  Severe anemia-likely secondary to bleeding in the right pleural space and metastatic carcinoma, red cell transfusions 09/09/2018 09/10/2018 3.  Dyspnea secondary to the large right pleural effusion and anemia 4.  History of tobacco use 5.  History of kidney stones 6.  Fever   Brian Ramirez has been diagnosed with metastatic poorly differentiated carcinoma.  The clinical presentation is most consistent with non-small cell lung cancer.  We have requested Foundation 1 testing to include PD1 testing.  I discussed treatment options with Brian Ramirez.  He understands no therapy will be curative.  I will recommend systemic chemotherapy to begin within the next few weeks.  We will consider adding a PD1 inhibitor.  A Pleurx catheter was placed yesterday.  Hopefully the pleural effusion will resolve with repeat drainage.  We are hopeful the anemia will improve if the hemorrhagic effusion is controlled.  Brian Ramirez lives alone and has no local  family.  He says it will be difficult for him to come back and forth to the Cancer center.  I am concerned he will be unable to live alone with his current performance status.  He may benefit from skilled nursing  facility placement.  Recommendations: 1.  Drain Pleurx catheter daily 2.  Transfuse packed red blood cells for further drop in the hemoglobin 3.  Continue naproxen 4.  Care management/social work consultation to discuss home versus skilled nursing facility placement. 5.  Outpatient follow-up will be scheduled at the Cancer center   LOS: 6 days   Betsy Coder, MD   09/11/2018, 2:10 PM

## 2018-09-12 NOTE — Progress Notes (Signed)
Per Dr. [name]: needs to see him on 09/19/18 with labs. Appointments scheduled. Patient told Dr. [name] that he needs help with transportation for his appointments. Left message for Ginette Otto to follow up.

## 2018-09-12 NOTE — Progress Notes (Signed)
Mid level provider paged to ask about decreasing fluids to kvo. RN is concerned that pt will get fluid overloaded at current iv fluid rate.
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
## 2018-09-13 ENCOUNTER — Encounter: Payer: Self-pay | Admitting: *Deleted

## 2018-09-13 ENCOUNTER — Other Ambulatory Visit: Payer: Self-pay | Admitting: *Deleted

## 2018-09-13 ENCOUNTER — Telehealth: Payer: Self-pay | Admitting: Oncology

## 2018-09-13 DIAGNOSIS — R918 Other nonspecific abnormal finding of lung field: Secondary | ICD-10-CM

## 2018-09-13 DIAGNOSIS — C3491 Malignant neoplasm of unspecified part of right bronchus or lung: Secondary | ICD-10-CM

## 2018-09-13 MED ORDER — AMOXICILLIN-POT CLAVULANATE 875-125 MG PO TABS: 1.0000 | ORAL_TABLET | Freq: Two times a day (BID) | ORAL | 0 refills | Status: AC

## 2018-09-13 NOTE — Progress Notes (Signed)
SATURATION QUALIFICATIONS: (This note is used to comply with regulatory documentation for home oxygen)  Patient Saturations on Room Air at Rest = 86%  Patient Saturations on Room Air while Ambulating = 84%  Patient Saturations on 2 Liters of oxygen while Ambulating = 93%  Please briefly explain why patient needs home oxygen:  Pt has lung cancer and fluid on the lung that requires a pleurx drain. Pt is SOB and desats at rest and with ambulation and requires oxygen.

## 2018-09-13 NOTE — Care Management (Signed)
Plan for d/c today, will need home O2 arranged. Contacted [facility] to deliver and set up at home. Contacted [facility] to make them aware. Patient states he has a friend that will come get him. No further needs identified.

## 2018-09-13 NOTE — Progress Notes (Signed)
I checked with pathology to make sure foundation one and PDL 1 were sent on recent pathology.
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
## 2018-09-13 NOTE — Care Management (Signed)
Spoke with patient at bedside, he is unsure about d/c today. May need blood transfusion and may need home O2. Unable to wean per RN. HH arranged with [facility], confirmed with patient. Will await decision about further treatment.

## 2018-09-13 NOTE — Discharge Summary (Signed)
Physician Discharge Summary  Brian Ramirez MBE:[id number] DOB: 1946-04-19 DOA: 09/04/2018  PCP: [name], MD  Admit date: 09/04/2018 Discharge date: 09/13/2018  Admitted From: Home Disposition:  home  Recommendations for Outpatient Follow-up:  1. Follow up with PCP in 1-2 weeks 2. Follow up with Oncology as scheduled  Discharge Condition:Stable CODE STATUS:Full Diet recommendation: Regular   Brief/Interim Summary: 73 y.o.malewith medical history significant ofhernia, prior tobacco abuse presents to ED with gradually worsening sob over the past month.Symptoms began as generalized malaise around Christmas 2019 which progressed to worsening SOB. Patient reports sob worsened to where pt was unable to ambulate even 3-5 feet, requiring 20-30 min to catch his breath. Pt is previously very active and exercises and lifts weights regularly. Since feeling SOB, pt has been unable to remain active. Pt has otherwise denied florid wt loss but reports decreased appetite and feeling dehydrated.Pt states he was a former smoker, quit 10yr ago  ED Course:In the ED, pt underwent CTA chest with findings of two R sided lung masses, measuring 4.6cm and 2.6cm with large R effusion. Pt since underwent IR guided thoracentesis. While getting procedure, Hospitalist consulted for consideration for admission.   Discharge Diagnoses:  Principal Problem:   Pleural effusion Active Problems:   Lung mass   Mass of right lung  Acute hypoxic respiratory failure: Due to effusion and primary lung lesions, also possible developing pneumonia. - Continue supplemental oxygen to maintain SpO2 >90%. - Wheezing on exam 1/28, no significant improvement with nebulizer Tx. - coarse breath sounds on exam with mucus production. Improved with flutter valve -Pt met home O2 requirements. [facility] Left perihilar CAP:  - Started unasyn, mucus/sputum production increased from baseline. To include  pseudomonal coverage, transitionedto zosyn. Vancomycin given but stopped with negative MRSA. - Sputum culture sent, pending, reincubated.  -Give tylenol, naproxenprn. No new infiltrates on CXR, evidence of wound infection on exam, or symptoms of UTI to otherwise explain fever. Hematopoetic malignancy on DDx though LDH normal. - Would complete course of abx, zosyn transitioned to augmentin on d/c  Large right-sided pleural effusion: In setting of right lung masses, suspect malignant etiology. Exudative by light's criteria, grossly bloody. Abundant WBCs on gram stain, no culture positivity. Cytology negative.  -Dr. GBonner Punahad discussed with IR 1/27, s/pchest tube/pleurx catheter placement 1/28 - Per oncology, drain pleurix every other day  Lung masses in RUL x2 (4.6cm, 2.6cm) as well as likely metastases on the left and right hilum, subcarinal lymph nodes and pleura seen on CT imaging. Former smoker.  - s/p CT-guided pleural mass biopsy 1/24 - Note, he is a poor bronchoscopy candidate due to previous jaw fracture, limited mobility. -Discussed with Oncology. Biopsy pos for malignancy. See Oncology note  AKI: Uncertain baseline, but CrCl has normalized >66mmin from Cr 1.33 on admission to 0.97.  Anemia of chronic disease and acute blood loss anemia: Ferritin high, TIBC low.  - Hgb 7.3 > 7.1 despite 1u PRBCs 1/26, repeated 1u PRBCs 1/27 with rise to 9.2 followed by drop to 8.1. Recheck CBC 1/29, suggested transfusion threshold being<8g/dl. -discussed with Oncology. Pt to have CBC re-checked in outpatient setting  Dehydration: Improved, taking po. - Restarted IV fluids with ~1L/d effusion accumulation. - Remains stable  Remote tobacco use:  - Noted.   Right paraspinal lumbar muscle strain: Due to laying in bed most likely. No red flag features. Improving.  - Trial tylenol/NSAID, heating pad.  - Stable  Discharge Instructions   Allergies as of  09/13/2018   No Known  Allergies     Medication List    STOP taking these medications   traMADol 50 MG tablet Commonly known as:  ULTRAM     TAKE these medications   amoxicillin-clavulanate 875-125 MG tablet Commonly known as:  AUGMENTIN Take 1 tablet by mouth 2 (two) times daily for 2 days. Start taking on:  September 14, 2018   aspirin 81 MG tablet Take 81 mg by mouth daily.   benzonatate 100 MG capsule Commonly known as:  TESSALON Take 100 mg by mouth every 8 (eight) hours as needed for cough.   ibuprofen 200 MG tablet Commonly known as:  ADVIL,MOTRIN Take 200 mg by mouth daily as needed for headache or moderate pain.   multivitamin with minerals Tabs tablet Take 1 tablet by mouth daily.            Durable Medical Equipment  (From admission, onward)         Start     Ordered   09/13/18 1453  For home use only DME oxygen  Once    Question Answer Comment  Mode or (Route) Nasal cannula   Liters per Minute 2   Frequency Continuous (stationary and portable oxygen unit needed)   Oxygen delivery system Gas      09/13/18 1452   09/12/18 1523  For home use only DME Walker rolling  Once    Question:  Patient needs a walker to treat with the following condition  Answer:  Unsteady gait   09/12/18 1523         Follow-up Information    [facility] Follow up.   Specialty:  [place] Why:  [facility] program-HH physical therapy,aide/nursing-pleurx cath/social worker Contact information: [place] [address] [phone]        [place] Follow up.   Why:  home rolling walker Contact information: [address] [place] [address] [phone]        Ladell Pier, MD Follow up.   Specialty:  Oncology Why:  as scheduled Contact information: [place] [address] [phone]          No Known Allergies  Consultations:  Oncology  Procedures/Studies: Dg Chest  1 View  Result Date: 09/04/2018 CLINICAL DATA:  Status post thoracentesis EXAM: CHEST  1 VIEW COMPARISON:  Chest radiograph 09/04/2017 FINDINGS: Decreased size of right pleural effusion, now occupying approximately 1/3 the volume of the right hemithorax previously 2/3. No pneumothorax. Nodular opacity in the left mid lung corresponds to findings on recent chest CT. Cardiomediastinal size is normal. IMPRESSION: Decreased size of right pleural effusion. No pneumothorax. Electronically Signed   By: Ulyses Jarred M.D.   On: 09/04/2018 16:46   Dg Chest 2 View  Result Date: 09/06/2018 CLINICAL DATA:  Shortness of breath.  Pleural effusion. EXAM: CHEST - 2 VIEW COMPARISON:  September 04, 2018 FINDINGS: The large right pleural effusion with underlying opacity may be mildly worsened in the interval. Left perihilar mild infiltrate suspected. No other change. IMPRESSION: 1. The moderate to large right pleural effusion appears larger in the interval. 2. Left perihilar opacity may represent developing infiltrate. Electronically Signed   By: Dorise Bullion III M.D   On: 09/06/2018 10:29   Ct Angio Chest Pe W And/or Wo Contrast  Result Date: 09/04/2018 CLINICAL DATA:  Pleural effusion and pulmonary nodules (suspicious for neoplasm) on chest radiograph. Shortness of  breath. Evaluate for PE. EXAM: CT ANGIOGRAPHY CHEST WITH CONTRAST TECHNIQUE: Multidetector CT imaging of the chest was performed using the standard protocol during bolus administration of intravenous contrast. Multiplanar CT image reconstructions and MIPs were obtained to evaluate the vascular anatomy. CONTRAST:  66m ISOVUE-370 IOPAMIDOL (ISOVUE-370) INJECTION 76% COMPARISON:  Chest radiograph dated 09/04/2018 FINDINGS: Cardiovascular: Satisfactory opacification of the bilateral pulmonary arteries to the lobar level. Segmental/segmental pulmonary arteries are limited due to motion degradation. No evidence of pulmonary embolism. No evidence of thoracic aortic  aneurysm or dissection. Atherosclerotic calcifications of the aortic arch. Mild coronary atherosclerosis of the LAD. The heart is normal in size.  No pericardial effusion. Mediastinum/Nodes: 12 mm right hilar node (series 4/image 49). 9 mm subcarinal node (series 4/image 52). Visualized thyroid is unremarkable. Lungs/Pleura: Evaluation lung parenchyma is constrained by respiratory motion. 2.7 x 4.6 cm mass in the anterior right upper lung (series 4/image 29), suspicious for primary bronchogenic neoplasm. Additional 1.8 x 2.6 cm nodule centrally in the right upper lobe (series 4/image 47), suspicious. Large right pleural effusion with pleural-based nodularity/metastases (for example, series 4/image 104). Associated compressive atelectasis of the right middle and lower lobes. Multiple pulmonary nodules in the left upper and lower lobes, lower lobe predominant, poorly evaluated due to motion degradation. Index nodule measures 14 mm in the central left lower lobe (series 10/image 28). These are compatible with metastases. Additional mild patchy opacity in the lingula (series 10/image 98). No pneumothorax. Upper Abdomen: Visualized upper abdomen is motion degraded but grossly unremarkable, noting aberrant perirenal vasculature along the right upper kidney (series 4/image 127). Musculoskeletal: Degenerative changes of the visualized thoracolumbar spine. Review of the MIP images confirms the above findings. IMPRESSION: No evidence of pulmonary embolism. 4.6 cm mass in the anterior right upper lung, suspicious for primary bronchogenic neoplasm versus pleural-based metastasis. Additional 2.6 cm nodule centrally in the right upper lobe, suspicious for primary bronchogenic neoplasm versus metastasis (related to the anterior mass). Large right pleural effusion with pleural-based metastases. Multiple pulmonary metastases in the left hemithorax. Possible small subcarinal and right hilar nodal metastases, indeterminate. Aortic  Atherosclerosis (ICD10-I70.0). Electronically Signed   By: SJulian HyM.D.   On: 09/04/2018 14:53   Ct Aspiration  Result Date: 09/08/2018 CLINICAL DATA:  Large right bloody pleural effusion and multiple pleural masses. Negative cytology on prior fluid withdrawn by thoracentesis on 09/04/2018. Significant reaccumulation pleural fluid since that time by chest x-ray. Request for biopsy of pleural mass for tissue diagnosis. EXAM: 1. CT-GUIDED RIGHT THORACENTESIS 2. CT GUIDED CORE BIOPSY OF RIGHT PLEURAL MASS ANESTHESIA/SEDATION: 2.0 mg IV Versed; 100 mcg IV Fentanyl Total Moderate Sedation Time:  23 minutes. The patient's level of consciousness and physiologic status were continuously monitored during the procedure by Radiology nursing. PROCEDURE: The procedure risks, benefits, and alternatives were explained to the patient. Questions regarding the procedure were encouraged and answered. The patient understands and consents to the procedure. A time-out was performed prior to initiating the procedure. The right anterolateral chest wall was prepped with chlorhexidine in a sterile fashion, and a sterile drape was applied covering the operative field. A sterile gown and sterile gloves were used for the procedure. Local anesthesia was provided with 1% Lidocaine. CT was performed to localize a right lateral pleural mass as well as right-sided pleural effusion. From an anterior approach, a 6 French Safe-T-Centesis catheter was advanced into the right pleural space under CT guidance. The catheter was attached to vacuum bottles and thoracentesis performed. Simultaneous insertion of a 17  gauge trocar needle was performed at the level of a lateral right pleural mass. A total of 4 separate 18 gauge core biopsy samples were obtained. After the biopsy procedure additional CT images were obtained. The Safe-T-Centesis catheter was removed. COMPLICATIONS: None FINDINGS: There is a massive recurrent right pleural effusion  with multiple hyperdense pleural and subpleural masses. A lateral pleural mass measuring approximately 2.2 cm was chosen for biopsy. Solid tissue samples were obtained. The patient tolerated removal of 3 L of grossly bloody fluid. Postprocedural CT shows moderate residual fluid remaining and improved aeration of the underlying right lung with no pneumothorax. There is abnormal atelectasis and consolidation of the central lung. IMPRESSION: 1. CT-guided biopsy of 2.2 cm right lateral pleural mass. 2. CT-guided thoracentesis also performed with removal of 3 L of grossly bloody fluid. Electronically Signed   By: Aletta Edouard M.D.   On: 09/08/2018 09:10   Ct Biopsy  Result Date: 09/08/2018 CLINICAL DATA:  Large right bloody pleural effusion and multiple pleural masses. Negative cytology on prior fluid withdrawn by thoracentesis on 09/04/2018. Significant reaccumulation pleural fluid since that time by chest x-ray. Request for biopsy of pleural mass for tissue diagnosis. EXAM: 1. CT-GUIDED RIGHT THORACENTESIS 2. CT GUIDED CORE BIOPSY OF RIGHT PLEURAL MASS ANESTHESIA/SEDATION: 2.0 mg IV Versed; 100 mcg IV Fentanyl Total Moderate Sedation Time:  23 minutes. The patient's level of consciousness and physiologic status were continuously monitored during the procedure by Radiology nursing. PROCEDURE: The procedure risks, benefits, and alternatives were explained to the patient. Questions regarding the procedure were encouraged and answered. The patient understands and consents to the procedure. A time-out was performed prior to initiating the procedure. The right anterolateral chest wall was prepped with chlorhexidine in a sterile fashion, and a sterile drape was applied covering the operative field. A sterile gown and sterile gloves were used for the procedure. Local anesthesia was provided with 1% Lidocaine. CT was performed to localize a right lateral pleural mass as well as right-sided pleural effusion. From an  anterior approach, a 6 French Safe-T-Centesis catheter was advanced into the right pleural space under CT guidance. The catheter was attached to vacuum bottles and thoracentesis performed. Simultaneous insertion of a 17 gauge trocar needle was performed at the level of a lateral right pleural mass. A total of 4 separate 18 gauge core biopsy samples were obtained. After the biopsy procedure additional CT images were obtained. The Safe-T-Centesis catheter was removed. COMPLICATIONS: None FINDINGS: There is a massive recurrent right pleural effusion with multiple hyperdense pleural and subpleural masses. A lateral pleural mass measuring approximately 2.2 cm was chosen for biopsy. Solid tissue samples were obtained. The patient tolerated removal of 3 L of grossly bloody fluid. Postprocedural CT shows moderate residual fluid remaining and improved aeration of the underlying right lung with no pneumothorax. There is abnormal atelectasis and consolidation of the central lung. IMPRESSION: 1. CT-guided biopsy of 2.2 cm right lateral pleural mass. 2. CT-guided thoracentesis also performed with removal of 3 L of grossly bloody fluid. Electronically Signed   By: Aletta Edouard M.D.   On: 09/08/2018 09:10   Dg Chest Port 1 View  Result Date: 09/11/2018 CLINICAL DATA:  Increased shortness of breath EXAM: PORTABLE CHEST 1 VIEW COMPARISON:  Two days ago FINDINGS: Progressive and large right pleural effusion with near right chest white out. There is leftward mediastinal shift. Stable patchy opacity in the left lung where there are nodules by CT. IMPRESSION: 1. Large and increased right pleural effusion with  mild mediastinal shift. 2. Known nodularity in the left lung. Electronically Signed   By: Monte Fantasia M.D.   On: 09/11/2018 06:07   Dg Chest Port 1 View  Result Date: 09/09/2018 CLINICAL DATA:  Acute respiratory failure. EXAM: PORTABLE CHEST 1 VIEW COMPARISON:  Chest CT 09/07/2018 FINDINGS: Cardiomediastinal  silhouette is normal. Mediastinal contours appear intact. Calcific atherosclerotic disease and tortuosity of the aorta. There is no evidence of pneumothorax. Minimally decreased right pleural effusion, still large in volume. Ill-defined right upper lobe and left lower hemithorax pulmonary masses. Osseous structures are without acute abnormality. Soft tissues are grossly normal. IMPRESSION: Minimally decreased in volume large right pleural effusion. Bilateral pulmonary masses. Calcific atherosclerotic disease and tortuosity of the aorta. Electronically Signed   By: Fidela Salisbury M.D.   On: 09/09/2018 17:46   Dg Chest Port 1 View  Result Date: 09/04/2018 CLINICAL DATA:  Shortness of Breath EXAM: PORTABLE CHEST 1 VIEW COMPARISON:  None. FINDINGS: Cardiac shadow is stable. Nodular changes are noted in the left lung base suspicious for metastatic disease. Large right-sided pleural effusion with underlying atelectasis is noted. Bony structures are within normal limits. IMPRESSION: Large right-sided pleural effusion with underlying atelectatic changes. Multiple nodular densities are noted over the left base suspicious for neoplastic involvement. CT is recommended for further evaluation. Electronically Signed   By: Inez Catalina M.D.   On: 09/04/2018 14:33   Ir Perc Pleural Drain W/[place]well Cath W/img Guide  Result Date: 09/11/2018 INDICATION: Malignant right pleural effusion EXAM: ULTRASOUND AND FLUOROSCOPIC RIGHT CHEST TUNNELED PLEURAL DRAIN (PLEURX CATHETER) MEDICATIONS: The patient is currently admitted to the hospital and receiving intravenous antibiotics. The antibiotics were administered within an appropriate time frame prior to the initiation of the procedure. ANESTHESIA/SEDATION: Fentanyl 100 mcg IV; Versed 2.0 mg IV Moderate Sedation Time:  11 MINUTES The patient was continuously monitored during the procedure by the interventional radiology nurse under my direct supervision. COMPLICATIONS: None  immediate. PROCEDURE: Informed written consent was obtained from the patient after a thorough discussion of the procedural risks, benefits and alternatives. All questions were addressed. Maximal Sterile Barrier Technique was utilized including caps, mask, sterile gowns, sterile gloves, sterile drape, hand hygiene and skin antiseptic. A timeout was performed prior to the initiation of the procedure. previous imaging reviewed. preliminary ultrasound performed. the complex loculated right pleural effusion was marked in the mid axillary line through a lower intercostal space. under sterile conditions and local anesthesia, ultrasound percutaneous access performed of the right chest. needle position confirmed with ultrasound. images obtained for documentation. amplatz guidewire inserted. the pleurx catheter was tunneled subcutaneously to the puncture site and advanced into the chest through a peel-away sheath. position confirmed with ultrasound and fluoroscopy. 1.2 l thoracentesis performed following insertion. catheter secured with prolene suture. entry site closed with subcuticular vicryl suture and dermabond. no immediate complication. patient tolerated the procedure well. IMPRESSION: Successful ultrasound fluoroscopic right chest tunneled pleural drain (PleurX catheter). 1.2 L thoracentesis performed after insertion. Electronically Signed   By: Jerilynn Mages.  Shick M.D.   On: 09/11/2018 17:30   US Thoracentesis Asp Pleural Space W/img Guide  Result Date: 09/04/2018 INDICATION: Patient with history of dyspnea, imaging findings of right lung mass with multiple pulmonary metastases, large right pleural effusion. Request made for diagnostic and therapeutic right thoracentesis. EXAM: ULTRASOUND GUIDED DIAGNOSTIC AND THERAPEUTIC RIGHT THORACENTESIS MEDICATIONS: None COMPLICATIONS: None immediate. PROCEDURE: An ultrasound guided thoracentesis was thoroughly discussed with the patient and questions answered. The benefits, risks,  alternatives and complications were also  discussed. The patient understands and wishes to proceed with the procedure. Written consent was obtained. Ultrasound was performed to localize and mark an adequate pocket of fluid in the right chest. The area was then prepped and draped in the normal sterile fashion. 1% Lidocaine was used for local anesthesia. Under ultrasound guidance a 6 Fr Safe-T-Centesis catheter was introduced. Thoracentesis was performed. The catheter was removed and a dressing applied. FINDINGS: A total of approximately 2.3 liters of dark,bloody fluid was removed. Samples were sent to the laboratory as requested by the clinical team. IMPRESSION: Successful ultrasound guided diagnostic and therapeutic right thoracentesis yielding 2.3 liters of pleural fluid. Read by: Rowe Robert, PA-C Electronically Signed   By: Aletta Edouard M.D.   On: 09/04/2018 16:46     Subjective: Eager to go home  Discharge Exam: Vitals:   09/13/18 0825 09/13/18 1328  BP: 118/70 104/67  Pulse: 100 92  Resp: 20 19  Temp: 98 F (36.7 C) 98 F (36.7 C)  SpO2: 97% 95%   Vitals:   09/13/18 0330 09/13/18 0411 09/13/18 0825 09/13/18 1328  BP:  138/87 118/70 104/67  Pulse:  (!) 101 100 92  Resp: '18 20 20 19  ' Temp:  98.1 F (36.7 C) 98 F (36.7 C) 98 F (36.7 C)  TempSrc:  Oral Oral Oral  SpO2:  96% 97% 95%  Weight:      Height:        General: Pt is alert, awake, not in acute distress Cardiovascular: RRR, S1/S2 +, no rubs, no gallops Respiratory: CTA bilaterally, no wheezing, no rhonchi Abdominal: Soft, NT, ND, bowel sounds + Extremities: no edema, no cyanosis   The results of significant diagnostics from this hospitalization (including imaging, microbiology, ancillary and laboratory) are listed below for reference.     Microbiology: Recent Results (from the past 240 hour(s))  Blood culture (routine x 2)     Status: None   Collection Time: 09/04/18  3:01 PM  Result Value Ref Range Status    Specimen Description   Final    BLOOD RIGHT HAND Performed at [facility], [place] [address] [place], [place] [address]    Special Requests   Final    BOTTLES DRAWN AEROBIC AND ANAEROBIC Blood Culture adequate volume Performed at [facility] [address], [place], [place] [address]    Culture   Final    NO GROWTH 5 DAYS Performed at [facility], [place] [address] [place], [place] [address]    Report Status 09/09/2018 FINAL  Final  Body fluid culture     Status: None   Collection Time: 09/04/18  4:58 PM  Result Value Ref Range Status   Specimen Description   Final    PLEURAL RIGHT Performed at [facility] [address], [place], [place] [address]    Special Requests   Final    NONE Performed at [facility], [place] [address] [place], [place] [address]    Gram Stain   Final    ABUNDANT WBC PRESENT,BOTH PMN AND MONONUCLEAR NO ORGANISMS SEEN    Culture   Final    NO GROWTH 3 DAYS Performed at [facility], [place] [address], [place], [place] [address]    Report Status 09/08/2018 FINAL  Final  Blood culture (routine x 2)     Status: None   Collection Time: 09/04/18  7:46 PM  Result Value Ref Range Status   Specimen Description  Final    BLOOD RIGHT ANTECUBITAL Performed at [facility] [address] [place], [place] [address]    Special Requests   Final    BOTTLES DRAWN AEROBIC AND ANAEROBIC Blood Culture adequate volume Performed at [facility] [address], [place], [place] [address]    Culture   Final    NO GROWTH 5 DAYS Performed at [facility], [place] [address] [place], [place] [address]    Report Status 09/09/2018 FINAL  Final  Expectorated sputum assessment w rflx to resp cult     Status: None   Collection Time: 09/10/18  9:07 AM  Result Value Ref Range Status   Specimen Description SPUTUM  Final   Special Requests NONE   Final   Sputum evaluation   Final    THIS SPECIMEN IS ACCEPTABLE FOR SPUTUM CULTURE Performed at [facility], [place] [address] [place], [place] [address]    Report Status 09/10/2018 FINAL  Final  Culture, respiratory     Status: None   Collection Time: 09/10/18  9:07 AM  Result Value Ref Range Status   Specimen Description   Final    SPUTUM Performed at [facility] [address] [place], [place] [address]    Special Requests   Final    NONE Reflexed from [id number] Performed at [facility], [place] [address] [place], [place] [address]    Gram Stain   Final    RARE SQUAMOUS EPITHELIAL CELLS PRESENT ABUNDANT WBC PRESENT,BOTH PMN AND MONONUCLEAR FEW GRAM POSITIVE COCCI FEW GRAM POSITIVE RODS FEW GRAM NEGATIVE RODS    Culture   Final    FEW Consistent with normal respiratory flora. Performed at [facility], [place] [address], [place], [place] [address]    Report Status 09/12/2018 FINAL  Final  MRSA PCR Screening     Status: None   Collection Time: 09/10/18 12:04 PM  Result Value Ref Range Status   MRSA by PCR NEGATIVE NEGATIVE Final    Comment:        The GeneXpert MRSA Assay (FDA approved for NASAL specimens only), is one component of a comprehensive MRSA colonization surveillance program. It is not intended to diagnose MRSA infection nor to guide or monitor treatment for MRSA infections. Performed at [facility], [place] [address], [place], [place] [address]   Culture, blood (routine x 2)     Status: None (Preliminary result)   Collection Time: 09/11/18  7:01 AM  Result Value Ref Range Status   Specimen Description   Final    BLOOD RIGHT HAND Performed at [facility] [address], [place], [place] [address]    Special Requests   Final    BOTTLES DRAWN AEROBIC AND ANAEROBIC Blood Culture adequate volume Performed at [facility] [address], [place], [place] [address]    Culture   Final    NO GROWTH 2 DAYS Performed at [facility] [address], [place], [place] [address]    Report Status PENDING  Incomplete  Culture, blood (routine x 2)     Status: None (Preliminary result)   Collection Time: 09/11/18  7:01 AM  Result Value Ref Range Status   Specimen Description   Final    BLOOD LEFT HAND Performed at [facility] [address] [place], [place] [address]    Special Requests   Final  BOTTLES DRAWN AEROBIC AND ANAEROBIC Blood Culture adequate volume Performed at [facility] [address], [place], [place] [address]    Culture   Final    NO GROWTH 2 DAYS Performed at [facility] [address] [place], [place] [address]    Report Status PENDING  Incomplete     Labs: BNP (last 3 results) Recent Labs    09/04/18 1334  BNP 16.5   Basic Metabolic Panel: Recent Labs  Lab 09/07/18 0540 09/10/18 0516 09/12/18 0630  NA 134* 131* 136  K 4.6 4.2 3.9  CL 99 99 104  CO2 '25 25 26  ' GLUCOSE 117* 88 103*  BUN '15 11 12  ' CREATININE 0.73 0.71 0.63  CALCIUM 8.4* 7.6* 7.8*   Liver Function Tests: No results for input(s): AST, ALT, ALKPHOS, BILITOT, PROT, ALBUMIN in the last 168 hours. No results for input(s): LIPASE, AMYLASE in the last 168 hours. No results for input(s): AMMONIA in the last 168 hours. CBC: Recent Labs  Lab 09/08/18 0541 09/09/18 0513 09/10/18 0516 09/10/18 2306 09/11/18 0539 09/12/18 1127  WBC 12.8* 11.8* 8.6  --  7.1 7.6  NEUTROABS  --   --   --   --  5.3  --   HGB 7.7* 7.3* 7.1* 9.2* 8.1* 8.7*  HCT 25.1* 24.2* 23.2* 30.2* 26.8* 29.0*  MCV 90.0 92.0 90.6  --  89.6 90.6  PLT 441* 353 328  --  374 394   Cardiac Enzymes: No results for input(s): CKTOTAL, CKMB, CKMBINDEX, TROPONINI in the last 168 hours. BNP: Invalid input(s): POCBNP CBG: No results for input(s): GLUCAP in the last 168 hours. D-Dimer No results for input(s):  DDIMER in the last 72 hours. Hgb A1c No results for input(s): HGBA1C in the last 72 hours. Lipid Profile No results for input(s): CHOL, HDL, LDLCALC, TRIG, CHOLHDL, LDLDIRECT in the last 72 hours. Thyroid function studies No results for input(s): TSH, T4TOTAL, T3FREE, THYROIDAB in the last 72 hours.  Invalid input(s): FREET3 Anemia work up No results for input(s): VITAMINB12, FOLATE, FERRITIN, TIBC, IRON, RETICCTPCT in the last 72 hours. Urinalysis    Component Value Date/Time   COLORURINE YELLOW 09/11/2018 0849   APPEARANCEUR HAZY (A) 09/11/2018 0849   LABSPEC 1.021 09/11/2018 0849   PHURINE 5.0 09/11/2018 0849   GLUCOSEU NEGATIVE 09/11/2018 0849   HGBUR NEGATIVE 09/11/2018 0849   BILIRUBINUR NEGATIVE 09/11/2018 0849   KETONESUR 20 (A) 09/11/2018 0849   PROTEINUR 30 (A) 09/11/2018 0849   NITRITE NEGATIVE 09/11/2018 0849   LEUKOCYTESUR NEGATIVE 09/11/2018 0849   Sepsis Labs Invalid input(s): PROCALCITONIN,  WBC,  LACTICIDVEN Microbiology Recent Results (from the past 240 hour(s))  Blood culture (routine x 2)     Status: None   Collection Time: 09/04/18  3:01 PM  Result Value Ref Range Status   Specimen Description   Final    BLOOD RIGHT HAND Performed at [facility], [place] [address], [place], [place] [address]    Special Requests   Final    BOTTLES DRAWN AEROBIC AND ANAEROBIC Blood Culture adequate volume Performed at [facility] [address], [place], [place] [address]    Culture   Final    NO GROWTH 5 DAYS Performed at [facility], [place] [address], [place], [place] [address]    Report Status 09/09/2018 FINAL  Final  Body fluid culture     Status: None   Collection Time: 09/04/18  4:58 PM  Result  Value Ref Range Status   Specimen Description   Final    PLEURAL RIGHT Performed at [facility] [address], [place], [place] [address]    Special Requests   Final    NONE Performed at [facility], [place] [address] [place], [place] [address]    Gram Stain   Final    ABUNDANT WBC PRESENT,BOTH PMN AND MONONUCLEAR NO ORGANISMS SEEN    Culture   Final    NO GROWTH 3 DAYS Performed at [facility], [place] [address] [place], [place] [address]    Report Status 09/08/2018 FINAL  Final  Blood culture (routine x 2)     Status: None   Collection Time: 09/04/18  7:46 PM  Result Value Ref Range Status   Specimen Description   Final    BLOOD RIGHT ANTECUBITAL Performed at [facility] [address], [place], [place] [address]    Special Requests   Final    BOTTLES DRAWN AEROBIC AND ANAEROBIC Blood Culture adequate volume Performed at [facility] [address] [place], [place] [address]    Culture   Final    NO GROWTH 5 DAYS Performed at [facility], [place] [address], [place], [place] [address]    Report Status 09/09/2018 FINAL  Final  Expectorated sputum assessment w rflx to resp cult     Status: None   Collection Time: 09/10/18  9:07 AM  Result Value Ref Range Status   Specimen Description SPUTUM  Final   Special Requests NONE  Final   Sputum evaluation   Final    THIS SPECIMEN IS ACCEPTABLE FOR SPUTUM CULTURE Performed at [facility], [place] [address] [place], [place] [address]    Report Status 09/10/2018 FINAL  Final  Culture, respiratory     Status: None   Collection Time: 09/10/18  9:07 AM  Result Value Ref Range Status   Specimen Description   Final    SPUTUM Performed at [facility] [address] [place], [place] [address]    Special Requests   Final    NONE Reflexed from [id number] Performed at [facility], [place] [address] [place], [place] [address]    Gram Stain   Final    RARE SQUAMOUS EPITHELIAL CELLS PRESENT ABUNDANT WBC PRESENT,BOTH PMN AND MONONUCLEAR FEW GRAM POSITIVE COCCI FEW GRAM POSITIVE RODS FEW GRAM NEGATIVE RODS     Culture   Final    FEW Consistent with normal respiratory flora. Performed at [facility], [place] [address], [place], [place] [address]    Report Status 09/12/2018 FINAL  Final  MRSA PCR Screening     Status: None   Collection Time: 09/10/18 12:04 PM  Result Value Ref Range Status   MRSA by PCR NEGATIVE NEGATIVE Final    Comment:        The GeneXpert MRSA Assay (FDA approved for NASAL specimens only), is one component of a comprehensive MRSA colonization surveillance program. It is not intended to diagnose MRSA infection nor to guide or monitor treatment for MRSA infections. Performed at [facility], [place] [address] [place], [place] [address]   Culture, blood (routine x 2)     Status: None (Preliminary result)   Collection Time: 09/11/18  7:01 AM  Result Value Ref Range Status   Specimen Description   Final    BLOOD RIGHT HAND Performed at [facility], [place] [address] [place], [place] [address]    Special Requests   Final    BOTTLES DRAWN AEROBIC AND ANAEROBIC Blood Culture adequate volume Performed at [facility] [address], [place], [place] [address]    Culture   Final    NO GROWTH 2 DAYS Performed at [facility] [address] [place], [place] [address]    Report Status PENDING  Incomplete  Culture, blood (routine x 2)     Status: None (Preliminary result)   Collection Time: 09/11/18  7:01 AM  Result Value Ref Range Status   Specimen Description   Final    BLOOD LEFT HAND Performed at [facility] [address] [place], [place] [address]    Special Requests   Final    BOTTLES DRAWN AEROBIC AND ANAEROBIC Blood Culture adequate volume Performed at [facility] [address] [place], [place] [address]    Culture   Final    NO GROWTH 2 DAYS Performed at [facility] [address] [place], [place] [address]    Report Status PENDING   Incomplete   Time spent: 30 min  SIGNED:   Marylu Lund, MD  [facility] 09/13/2018, 3:20 PM  If 7PM-7AM, please contact night-coverage

## 2018-09-13 NOTE — Progress Notes (Signed)
IP PROGRESS NOTE  Subjective:   Brian Ramirez reports improved dyspnea since the Pleurx was drained this morning.  He wants to go home as opposed to a skilled nursing facility. Objective: Vital signs in last 24 hours: Blood pressure 132/73, pulse (!) 102, temperature 98.6 F (37 C), temperature source Oral, resp. rate (!) 24, height 6\' 1"  (1.854 m), weight 155 lb 13.8 oz (70.7 kg), SpO2 98 %.  Intake/Output from previous day: 01/27 0701 - 01/28 0700 In: 2126.4 [I.V.:1248.4; Blood:328; IV Piggyback:550] Out: 275 [Urine:275]  Physical Exam: Lungs: Bilateral expiratory wheeze, decreased breath sounds throughout the right posterior chest Cardiovascular: Regular rate and rhythm Abdomen: No hepatomegaly Vascular: No leg edema  Lab Results: Recent Labs    09/10/18 0516 09/10/18 2306 09/11/18 0539  WBC 8.6  --  7.1  HGB 7.1* 9.2* 8.1*  HCT 23.2* 30.2* 26.8*  PLT 328  --  374    BMET Recent Labs    09/10/18 0516  NA 131*  K 4.2  CL 99  CO2 25  GLUCOSE 88  BUN 11  CREATININE 0.71  CALCIUM 7.6*    No results found for: CEA1  Studies/Results: Dg Chest Port 1 View  Result Date: 09/11/2018 CLINICAL DATA:  Increased shortness of breath EXAM: PORTABLE CHEST 1 VIEW COMPARISON:  Two days ago FINDINGS: Progressive and large right pleural effusion with near right chest white out. There is leftward mediastinal shift. Stable patchy opacity in the left lung where there are nodules by CT. IMPRESSION: 1. Large and increased right pleural effusion with mild mediastinal shift. 2. Known nodularity in the left lung. Electronically Signed   By: Monte Fantasia M.D.   On: 09/11/2018 06:07   Dg Chest Port 1 View  Result Date: 09/09/2018 CLINICAL DATA:  Acute respiratory failure. EXAM: PORTABLE CHEST 1 VIEW COMPARISON:  Chest CT 09/07/2018 FINDINGS: Cardiomediastinal silhouette is normal. Mediastinal contours appear intact. Calcific atherosclerotic disease and tortuosity of the aorta. There is  no evidence of pneumothorax. Minimally decreased right pleural effusion, still large in volume. Ill-defined right upper lobe and left lower hemithorax pulmonary masses. Osseous structures are without acute abnormality. Soft tissues are grossly normal. IMPRESSION: Minimally decreased in volume large right pleural effusion. Bilateral pulmonary masses. Calcific atherosclerotic disease and tortuosity of the aorta. Electronically Signed   By: Fidela Salisbury M.D.   On: 09/09/2018 17:46    Medications: I have reviewed the patient's current medications.  Assessment/Plan: 1.  Large right pleural effusion, right lung mass, right pleural-based masses, left lung nodules  Right thoracentesis 09/04/2018-negative cytology  CT biopsy of right lateral pleural mass 09/07/2018- poorly differentiated non-small cell carcinoma, malignant cells positive for cytokeratin AE1/AE3 and cytokeratin 8/18, histology favoring adenocarcinoma  2.  Severe anemia-likely secondary to bleeding in the right pleural space and metastatic carcinoma, red cell transfusions 09/09/2018 09/10/2018 3.  Dyspnea secondary to the large right pleural effusion and anemia 4.  History of tobacco use 5.  History of kidney stones 6.  Fever   Brian Ramirez appears unchanged.  The plan is to begin systemic therapy as an outpatient.  I recommend adding the Pleurx catheter daily while he is here and then every other day at home.  He has anemia secondary to chronic disease and bleeding into the pleural space.  He will likely continue to require red cell transfusion support.  Recommendations: 1.  Drain Pleurx catheter daily 2.  Transfuse packed red blood cells for further drop in the hemoglobin 3.  Continue naproxen 4.  Arrange for home oxygen, home nursing care of the Pleurx catheter 5.  Outpatient follow-up at the Cancer center 09/19/2018 6.  He will meet with the thoracic oncology navigator today   LOS: 6 days   Betsy Coder, MD    09/11/2018, 2:10 PM

## 2018-09-13 NOTE — Progress Notes (Signed)
Pt O2 sat 87% on RA at rest. Currently requiring 2L [place] at rest, O2 sat 93%.

## 2018-09-13 NOTE — Telephone Encounter (Signed)
Called patient to arrange transportation after receiving a referral from [facility].   Got him set up with transportation on 2/5.

## 2018-09-13 NOTE — Progress Notes (Signed)
Referring Physician(s): Patrecia Pour  Supervising Physician: Marybelle Killings  Patient Status:  [facility] - In-pt  Chief Complaint: None  Subjective:  Recurrent malignant right pleural effusion s/p right pleurX catheter placement 09/11/2018 by Dr. Annamaria Boots. Patient awake and alert sitting in bed with no complaints at this time. Reports that his right pleurX catheter was used yesterday without complications. Right pleurX catheter site c/d/i.   Allergies: Patient has no known allergies.  Medications: Prior to Admission medications   Medication Sig Start Date End Date Taking? Authorizing Provider  aspirin 81 MG tablet Take 81 mg by mouth daily.   Yes [provider]  benzonatate (TESSALON) 100 MG capsule Take 100 mg by mouth every 8 (eight) hours as needed for cough.   Yes [provider]  ibuprofen (ADVIL,MOTRIN) 200 MG tablet Take 200 mg by mouth daily as needed for headache or moderate pain.   Yes [provider]  Multiple Vitamin (MULTIVITAMIN WITH MINERALS) TABS tablet Take 1 tablet by mouth daily.   Yes [provider]  traMADol (ULTRAM) 50 MG tablet Take 1-2 tablets (50-100 mg total) by mouth every 6 (six) hours as needed for moderate pain or severe pain. Patient not taking: Reported on 09/04/2018 11/22/16   [name], MD     Vital Signs: BP 118/70 (BP Location: Right Arm)   Pulse 100   Temp 98 F (36.7 C) (Oral)   Resp 20   Ht 6\' 1"  (1.854 m)   Wt 155 lb 13.8 oz (70.7 kg)   SpO2 97%   BMI 20.56 kg/m   Physical Exam Vitals signs and nursing note reviewed.  Constitutional:      General: He is not in acute distress.    Appearance: Normal appearance.  Pulmonary:     Effort: Pulmonary effort is normal. No respiratory distress.     Comments: Right pleurX catheter incision without erythema, drainage, or tenderness. Skin:    General: Skin is warm and dry.  Neurological:     Mental Status: He is alert and oriented to person, place, and  time.  Psychiatric:        Mood and Affect: Mood normal.        Behavior: Behavior normal.        Thought Content: Thought content normal.        Judgment: Judgment normal.     Imaging: Dg Chest Port 1 View  Result Date: 09/11/2018 CLINICAL DATA:  Increased shortness of breath EXAM: PORTABLE CHEST 1 VIEW COMPARISON:  Two days ago FINDINGS: Progressive and large right pleural effusion with near right chest white out. There is leftward mediastinal shift. Stable patchy opacity in the left lung where there are nodules by CT. IMPRESSION: 1. Large and increased right pleural effusion with mild mediastinal shift. 2. Known nodularity in the left lung. Electronically Signed   By: Monte Fantasia M.D.   On: 09/11/2018 06:07   Dg Chest Port 1 View  Result Date: 09/09/2018 CLINICAL DATA:  Acute respiratory failure. EXAM: PORTABLE CHEST 1 VIEW COMPARISON:  Chest CT 09/07/2018 FINDINGS: Cardiomediastinal silhouette is normal. Mediastinal contours appear intact. Calcific atherosclerotic disease and tortuosity of the aorta. There is no evidence of pneumothorax. Minimally decreased right pleural effusion, still large in volume. Ill-defined right upper lobe and left lower hemithorax pulmonary masses. Osseous structures are without acute abnormality. Soft tissues are grossly normal. IMPRESSION: Minimally decreased in volume large right pleural effusion. Bilateral pulmonary masses. Calcific atherosclerotic disease and tortuosity of the aorta.  Electronically Signed   By: Fidela Salisbury M.D.   On: 09/09/2018 17:46   Ir Perc Pleural Drain W/[place] Cath W/img Guide  Result Date: 09/11/2018 INDICATION: Malignant right pleural effusion EXAM: ULTRASOUND AND FLUOROSCOPIC RIGHT CHEST TUNNELED PLEURAL DRAIN (PLEURX CATHETER) MEDICATIONS: The patient is currently admitted to the hospital and receiving intravenous antibiotics. The antibiotics were administered within an appropriate time frame prior to the initiation of  the procedure. ANESTHESIA/SEDATION: Fentanyl 100 mcg IV; Versed 2.0 mg IV Moderate Sedation Time:  11 MINUTES The patient was continuously monitored during the procedure by the interventional radiology nurse under my direct supervision. COMPLICATIONS: None immediate. PROCEDURE: Informed written consent was obtained from the patient after a thorough discussion of the procedural risks, benefits and alternatives. All questions were addressed. Maximal Sterile Barrier Technique was utilized including caps, mask, sterile gowns, sterile gloves, sterile drape, hand hygiene and skin antiseptic. A timeout was performed prior to the initiation of the procedure. previous imaging reviewed. preliminary ultrasound performed. the complex loculated right pleural effusion was marked in the mid axillary line through a lower intercostal space. under sterile conditions and local anesthesia, ultrasound percutaneous access performed of the right chest. needle position confirmed with ultrasound. images obtained for documentation. amplatz guidewire inserted. the pleurx catheter was tunneled subcutaneously to the puncture site and advanced into the chest through a peel-away sheath. position confirmed with ultrasound and fluoroscopy. 1.2 l thoracentesis performed following insertion. catheter secured with prolene suture. entry site closed with subcuticular vicryl suture and dermabond. no immediate complication. patient tolerated the procedure well. IMPRESSION: Successful ultrasound fluoroscopic right chest tunneled pleural drain (PleurX catheter). 1.2 L thoracentesis performed after insertion. Electronically Signed   By: Jerilynn Mages.  Shick M.D.   On: 09/11/2018 17:30    Labs:  CBC: Recent Labs    09/09/18 0513 09/10/18 0516 09/10/18 2306 09/11/18 0539 09/12/18 1127  WBC 11.8* 8.6  --  7.1 7.6  HGB 7.3* 7.1* 9.2* 8.1* 8.7*  HCT 24.2* 23.2* 30.2* 26.8* 29.0*  PLT 353 328  --  374 394    COAGS: Recent Labs    09/07/18 0540  INR  1.03    BMP: Recent Labs    09/05/18 0459 09/07/18 0540 09/10/18 0516 09/12/18 0630  NA 133* 134* 131* 136  K 4.6 4.6 4.2 3.9  CL 100 99 99 104  CO2 25 25 25 26   GLUCOSE 112* 117* 88 103*  BUN 21 15 11 12   CALCIUM 8.4* 8.4* 7.6* 7.8*  CREATININE 0.97 0.73 0.71 0.63  GFRNONAA >60 >60 >60 >60  GFRAA >60 >60 >60 >60    LIVER FUNCTION TESTS: Recent Labs    09/04/18 1335 09/05/18 0459  BILITOT 1.0 0.8  AST 24 28  ALT 24 24  ALKPHOS 68 57  PROT 7.5 6.0*  ALBUMIN 2.8* 2.2*    Assessment and Plan:  Recurrent malignant right pleural effusion s/p right pleurX catheter placement 09/11/2018 by Dr. Annamaria Boots. Right pleurX catheter stable and ready for use. Please call IR with questions/concerns.   Electronically Signed: Earley Abide, PA-C 09/13/2018, 11:19 AM   I spent a total of 15 Minutes at the the patient's bedside AND on the patient's hospital floor or unit, greater than 50% of which was counseling/coordinating care for recurrent malignant right pleural effusion s/p right pleurX catheter placement.

## 2018-09-13 NOTE — Progress Notes (Signed)
Oncology Nurse Navigator Documentation  Oncology Nurse Navigator Flowsheets 09/13/2018  Navigator Location [facility]  Referral date to RadOnc/MedOnc 09/12/2018  Navigator Encounter Type Other/Per Dr. Gearldine Shown request, I spoke to Mr. Gaughan today in the hospital.  I gave and explain information on lung cancer.  I asked for him to tell me about himself.  I listened as he explained. Mr. Ofarrell lives alone and has no family here.  He needs transportation to appts.  I will notify our transportation coordinator of his needs.  He will have [facility] come into home to do an evaluation on needs.  That will be set up through the hospital.  I will refer Mr. Erman to CSW to follow up with him due to no support system.    Abnormal Finding Date 09/04/2018  Confirmed Diagnosis Date 09/07/2018  Patient Visit Type Inpatient  Treatment Phase Pre-Tx/Tx Discussion  Barriers/Navigation Needs Education;Coordination of Care  Education Newly Diagnosed Cancer Education;Other  Interventions Referrals;Coordination of Care;Education  Referrals Social Work;Other  Coordination of Care Other  Education Method Verbal;Written  Acuity Level 3  Time Spent with Patient 60

## 2018-09-13 NOTE — Progress Notes (Signed)
The proposed treatment discussed in cancer conference 09/13/2018 is for discussion purpose only and is not a binding recommendation.  The patient was not physically examined nor present for their treatment options.  Therefore, final treatment plans cannot be decided.

## 2018-09-13 NOTE — Progress Notes (Signed)
Pathology verified moleculars and PDL 1 sent out on 09/12/2018

## 2018-09-13 NOTE — Progress Notes (Signed)
Rt chest plurex tube drained of 1050cc dark red drainage. Pt tolerated well , able to take deep breaths & cough during the process causing more drainage.

## 2018-09-14 ENCOUNTER — Telehealth: Payer: Self-pay | Admitting: *Deleted

## 2018-09-14 DIAGNOSIS — J9621 Acute and chronic respiratory failure with hypoxia: Secondary | ICD-10-CM | POA: Diagnosis not present

## 2018-09-14 DIAGNOSIS — D638 Anemia in other chronic diseases classified elsewhere: Secondary | ICD-10-CM | POA: Diagnosis not present

## 2018-09-14 DIAGNOSIS — C3411 Malignant neoplasm of upper lobe, right bronchus or lung: Secondary | ICD-10-CM | POA: Diagnosis not present

## 2018-09-14 DIAGNOSIS — Z87891 Personal history of nicotine dependence: Secondary | ICD-10-CM | POA: Diagnosis not present

## 2018-09-14 DIAGNOSIS — Z602 Problems related to living alone: Secondary | ICD-10-CM | POA: Diagnosis not present

## 2018-09-14 DIAGNOSIS — Z7982 Long term (current) use of aspirin: Secondary | ICD-10-CM | POA: Diagnosis not present

## 2018-09-14 DIAGNOSIS — J910 Malignant pleural effusion: Secondary | ICD-10-CM | POA: Diagnosis not present

## 2018-09-14 DIAGNOSIS — J181 Lobar pneumonia, unspecified organism: Secondary | ICD-10-CM | POA: Diagnosis not present

## 2018-09-14 DIAGNOSIS — Z9981 Dependence on supplemental oxygen: Secondary | ICD-10-CM | POA: Diagnosis not present

## 2018-09-14 DIAGNOSIS — Z9181 History of falling: Secondary | ICD-10-CM | POA: Diagnosis not present

## 2018-09-14 DIAGNOSIS — Z48813 Encounter for surgical aftercare following surgery on the respiratory system: Secondary | ICD-10-CM | POA: Diagnosis not present

## 2018-09-14 NOTE — Telephone Encounter (Signed)
"  Cecille Rubin with [facility] calling in reference to new admission [facility].  Hospital discharged yesterday with PleuRx drain.  950 ml bright red blood drained today.  I've never drained blood from a PleuRx but Azucena Kuba says blood present the last four times PleuRx has been drained.  Is this what Dr. [name] saw in the hospital?  Is there anything different to do for his drain?"  Verbal order received and read back from Dr. [name] in reference to [place] drain.  Drain has drained of bright red blood daily.  PLeuRx should be drained every other day.  Returned call after call lost.  Order given to Lorenda Hatchet RN  at this time.

## 2018-09-15 DIAGNOSIS — J9621 Acute and chronic respiratory failure with hypoxia: Secondary | ICD-10-CM | POA: Diagnosis not present

## 2018-09-15 DIAGNOSIS — J910 Malignant pleural effusion: Secondary | ICD-10-CM | POA: Diagnosis not present

## 2018-09-15 DIAGNOSIS — D638 Anemia in other chronic diseases classified elsewhere: Secondary | ICD-10-CM | POA: Diagnosis not present

## 2018-09-15 DIAGNOSIS — J181 Lobar pneumonia, unspecified organism: Secondary | ICD-10-CM | POA: Diagnosis not present

## 2018-09-15 DIAGNOSIS — Z48813 Encounter for surgical aftercare following surgery on the respiratory system: Secondary | ICD-10-CM | POA: Diagnosis not present

## 2018-09-15 DIAGNOSIS — C3411 Malignant neoplasm of upper lobe, right bronchus or lung: Secondary | ICD-10-CM | POA: Diagnosis not present

## 2018-09-16 DIAGNOSIS — J9621 Acute and chronic respiratory failure with hypoxia: Secondary | ICD-10-CM | POA: Diagnosis not present

## 2018-09-16 DIAGNOSIS — C3411 Malignant neoplasm of upper lobe, right bronchus or lung: Secondary | ICD-10-CM | POA: Diagnosis not present

## 2018-09-16 DIAGNOSIS — D638 Anemia in other chronic diseases classified elsewhere: Secondary | ICD-10-CM | POA: Diagnosis not present

## 2018-09-16 DIAGNOSIS — J910 Malignant pleural effusion: Secondary | ICD-10-CM | POA: Diagnosis not present

## 2018-09-16 DIAGNOSIS — Z48813 Encounter for surgical aftercare following surgery on the respiratory system: Secondary | ICD-10-CM | POA: Diagnosis not present

## 2018-09-16 DIAGNOSIS — J181 Lobar pneumonia, unspecified organism: Secondary | ICD-10-CM | POA: Diagnosis not present

## 2018-09-16 LAB — CULTURE, BLOOD (ROUTINE X 2)
Culture: NO GROWTH
Culture: NO GROWTH
Special Requests: ADEQUATE
Special Requests: ADEQUATE

## 2018-09-17 ENCOUNTER — Telehealth: Payer: Self-pay | Admitting: *Deleted

## 2018-09-17 DIAGNOSIS — D638 Anemia in other chronic diseases classified elsewhere: Secondary | ICD-10-CM | POA: Diagnosis not present

## 2018-09-17 DIAGNOSIS — J181 Lobar pneumonia, unspecified organism: Secondary | ICD-10-CM | POA: Diagnosis not present

## 2018-09-17 DIAGNOSIS — C3411 Malignant neoplasm of upper lobe, right bronchus or lung: Secondary | ICD-10-CM | POA: Diagnosis not present

## 2018-09-17 DIAGNOSIS — J9621 Acute and chronic respiratory failure with hypoxia: Secondary | ICD-10-CM | POA: Diagnosis not present

## 2018-09-17 DIAGNOSIS — J910 Malignant pleural effusion: Secondary | ICD-10-CM | POA: Diagnosis not present

## 2018-09-17 DIAGNOSIS — Z48813 Encounter for surgical aftercare following surgery on the respiratory system: Secondary | ICD-10-CM | POA: Diagnosis not present

## 2018-09-17 NOTE — Telephone Encounter (Signed)
Not able to use our UBER service at this time due to inability to negotiate the 8 steps coming from his home to get to the driveway. Needs non-emergency transport and there should be a form to complete for medical necessity for his medicaid to cover the transportation.  Sent message to [facility] CSW to follow up on the form and how to arrange for this transport to office and back home.

## 2018-09-18 ENCOUNTER — Encounter (HOSPITAL_COMMUNITY): Payer: Self-pay | Admitting: Oncology

## 2018-09-18 DIAGNOSIS — D638 Anemia in other chronic diseases classified elsewhere: Secondary | ICD-10-CM | POA: Diagnosis not present

## 2018-09-18 DIAGNOSIS — Z48813 Encounter for surgical aftercare following surgery on the respiratory system: Secondary | ICD-10-CM | POA: Diagnosis not present

## 2018-09-18 DIAGNOSIS — C3411 Malignant neoplasm of upper lobe, right bronchus or lung: Secondary | ICD-10-CM | POA: Diagnosis not present

## 2018-09-18 DIAGNOSIS — J910 Malignant pleural effusion: Secondary | ICD-10-CM | POA: Diagnosis not present

## 2018-09-18 DIAGNOSIS — J181 Lobar pneumonia, unspecified organism: Secondary | ICD-10-CM | POA: Diagnosis not present

## 2018-09-18 DIAGNOSIS — J9621 Acute and chronic respiratory failure with hypoxia: Secondary | ICD-10-CM | POA: Diagnosis not present

## 2018-09-19 ENCOUNTER — Inpatient Hospital Stay (HOSPITAL_COMMUNITY)
Admission: AD | Admit: 2018-09-19 | Discharge: 2018-09-25 | DRG: 180 | Disposition: A | Discharge status: Skilled Nursing Facility | Payer: Medicare Other | Source: Ambulatory Visit | Attending: Internal Medicine | Admitting: Internal Medicine

## 2018-09-19 ENCOUNTER — Observation Stay (HOSPITAL_COMMUNITY): Payer: Medicare Other

## 2018-09-19 ENCOUNTER — Other Ambulatory Visit: Payer: Self-pay

## 2018-09-19 ENCOUNTER — Encounter (HOSPITAL_COMMUNITY): Payer: Self-pay | Admitting: *Deleted

## 2018-09-19 ENCOUNTER — Inpatient Hospital Stay: Payer: Medicare Other | Attending: Oncology | Admitting: Oncology

## 2018-09-19 ENCOUNTER — Inpatient Hospital Stay: Payer: Medicare Other

## 2018-09-19 ENCOUNTER — Observation Stay (HOSPITAL_BASED_OUTPATIENT_CLINIC_OR_DEPARTMENT_OTHER): Payer: Medicare Other

## 2018-09-19 VITALS — BP 120/70 | HR 98 | Temp 97.5°F | Resp 20 | Ht 73.0 in

## 2018-09-19 DIAGNOSIS — C3491 Malignant neoplasm of unspecified part of right bronchus or lung: Secondary | ICD-10-CM | POA: Diagnosis present

## 2018-09-19 DIAGNOSIS — L89151 Pressure ulcer of sacral region, stage 1: Secondary | ICD-10-CM | POA: Diagnosis present

## 2018-09-19 DIAGNOSIS — R5081 Fever presenting with conditions classified elsewhere: Secondary | ICD-10-CM | POA: Diagnosis not present

## 2018-09-19 DIAGNOSIS — Z87891 Personal history of nicotine dependence: Secondary | ICD-10-CM

## 2018-09-19 DIAGNOSIS — D638 Anemia in other chronic diseases classified elsewhere: Secondary | ICD-10-CM | POA: Diagnosis not present

## 2018-09-19 DIAGNOSIS — R627 Adult failure to thrive: Secondary | ICD-10-CM | POA: Diagnosis present

## 2018-09-19 DIAGNOSIS — J90 Pleural effusion, not elsewhere classified: Secondary | ICD-10-CM | POA: Diagnosis present

## 2018-09-19 DIAGNOSIS — D649 Anemia, unspecified: Secondary | ICD-10-CM | POA: Diagnosis not present

## 2018-09-19 DIAGNOSIS — R918 Other nonspecific abnormal finding of lung field: Secondary | ICD-10-CM | POA: Diagnosis not present

## 2018-09-19 DIAGNOSIS — M2550 Pain in unspecified joint: Secondary | ICD-10-CM | POA: Diagnosis not present

## 2018-09-19 DIAGNOSIS — Z743 Need for continuous supervision: Secondary | ICD-10-CM | POA: Diagnosis not present

## 2018-09-19 DIAGNOSIS — E440 Moderate protein-calorie malnutrition: Secondary | ICD-10-CM | POA: Diagnosis present

## 2018-09-19 DIAGNOSIS — R000 Tachycardia, unspecified: Secondary | ICD-10-CM | POA: Diagnosis present

## 2018-09-19 DIAGNOSIS — Z6821 Body mass index (BMI) 21.0-21.9, adult: Secondary | ICD-10-CM | POA: Diagnosis not present

## 2018-09-19 DIAGNOSIS — J910 Malignant pleural effusion: Secondary | ICD-10-CM | POA: Diagnosis present

## 2018-09-19 DIAGNOSIS — R2689 Other abnormalities of gait and mobility: Secondary | ICD-10-CM | POA: Diagnosis not present

## 2018-09-19 DIAGNOSIS — R0602 Shortness of breath: Secondary | ICD-10-CM | POA: Diagnosis present

## 2018-09-19 DIAGNOSIS — R609 Edema, unspecified: Secondary | ICD-10-CM | POA: Diagnosis not present

## 2018-09-19 DIAGNOSIS — E860 Dehydration: Secondary | ICD-10-CM | POA: Diagnosis present

## 2018-09-19 DIAGNOSIS — R509 Fever, unspecified: Secondary | ICD-10-CM | POA: Diagnosis present

## 2018-09-19 DIAGNOSIS — C799 Secondary malignant neoplasm of unspecified site: Secondary | ICD-10-CM | POA: Diagnosis not present

## 2018-09-19 DIAGNOSIS — D630 Anemia in neoplastic disease: Secondary | ICD-10-CM | POA: Diagnosis present

## 2018-09-19 DIAGNOSIS — R278 Other lack of coordination: Secondary | ICD-10-CM | POA: Diagnosis not present

## 2018-09-19 DIAGNOSIS — L8990 Pressure ulcer of unspecified site, unspecified stage: Secondary | ICD-10-CM

## 2018-09-19 DIAGNOSIS — J9691 Respiratory failure, unspecified with hypoxia: Secondary | ICD-10-CM | POA: Diagnosis not present

## 2018-09-19 DIAGNOSIS — L8995 Pressure ulcer of unspecified site, unstageable: Secondary | ICD-10-CM | POA: Diagnosis not present

## 2018-09-19 DIAGNOSIS — C782 Secondary malignant neoplasm of pleura: Secondary | ICD-10-CM | POA: Diagnosis present

## 2018-09-19 DIAGNOSIS — R279 Unspecified lack of coordination: Secondary | ICD-10-CM | POA: Diagnosis not present

## 2018-09-19 DIAGNOSIS — R531 Weakness: Secondary | ICD-10-CM | POA: Diagnosis not present

## 2018-09-19 DIAGNOSIS — Z79899 Other long term (current) drug therapy: Secondary | ICD-10-CM | POA: Diagnosis not present

## 2018-09-19 DIAGNOSIS — C3490 Malignant neoplasm of unspecified part of unspecified bronchus or lung: Secondary | ICD-10-CM | POA: Diagnosis not present

## 2018-09-19 DIAGNOSIS — J9621 Acute and chronic respiratory failure with hypoxia: Secondary | ICD-10-CM | POA: Diagnosis present

## 2018-09-19 DIAGNOSIS — R600 Localized edema: Secondary | ICD-10-CM | POA: Diagnosis present

## 2018-09-19 DIAGNOSIS — R0600 Dyspnea, unspecified: Secondary | ICD-10-CM

## 2018-09-19 DIAGNOSIS — R0902 Hypoxemia: Secondary | ICD-10-CM | POA: Diagnosis not present

## 2018-09-19 DIAGNOSIS — Z87442 Personal history of urinary calculi: Secondary | ICD-10-CM | POA: Diagnosis not present

## 2018-09-19 DIAGNOSIS — R0603 Acute respiratory distress: Secondary | ICD-10-CM | POA: Diagnosis not present

## 2018-09-19 DIAGNOSIS — Z7401 Bed confinement status: Secondary | ICD-10-CM | POA: Diagnosis not present

## 2018-09-19 HISTORY — DX: Shortness of breath: R06.02

## 2018-09-19 LAB — COMPREHENSIVE METABOLIC PANEL
ALT: 32 U/L (ref 0–44)
ANION GAP: 4 — AB (ref 5–15)
AST: 39 U/L (ref 15–41)
Albumin: 1.6 g/dL — ABNORMAL LOW (ref 3.5–5.0)
Alkaline Phosphatase: 62 U/L (ref 38–126)
BUN: 13 mg/dL (ref 8–23)
CHLORIDE: 99 mmol/L (ref 98–111)
CO2: 32 mmol/L (ref 22–32)
Calcium: 7.8 mg/dL — ABNORMAL LOW (ref 8.9–10.3)
Creatinine, Ser: 0.66 mg/dL (ref 0.61–1.24)
GFR calc Af Amer: >60 mL/min (ref >60)
GFR calc non Af Amer: >60 mL/min (ref >60)
Glucose, Bld: 117 mg/dL — ABNORMAL HIGH (ref 70–99)
POTASSIUM: 4 mmol/L (ref 3.5–5.1)
Sodium: 135 mmol/L (ref 135–145)
Total Bilirubin: 0.4 mg/dL (ref 0.3–1.2)
Total Protein: 5.9 g/dL — ABNORMAL LOW (ref 6.5–8.1)

## 2018-09-19 LAB — CBC WITH DIFFERENTIAL/PLATELET
Abs Immature Granulocytes: 0.16 10*3/uL — ABNORMAL HIGH (ref 0.00–0.07)
Basophils Absolute: 0 10*3/uL (ref 0.0–0.1)
Basophils Relative: 0 %
Eosinophils Absolute: 0.1 10*3/uL (ref 0.0–0.5)
Eosinophils Relative: 1 %
HCT: 26.1 % — ABNORMAL LOW (ref 39.0–52.0)
Hemoglobin: 7.8 g/dL — ABNORMAL LOW (ref 13.0–17.0)
Immature Granulocytes: 1 %
Lymphocytes Relative: 4 %
Lymphs Abs: 0.5 10*3/uL — ABNORMAL LOW (ref 0.7–4.0)
MCH: 26.3 pg (ref 26.0–34.0)
MCHC: 29.9 g/dL — AB (ref 30.0–36.0)
MCV: 87.9 fL (ref 80.0–100.0)
Monocytes Absolute: 1.5 10*3/uL — ABNORMAL HIGH (ref 0.1–1.0)
Monocytes Relative: 12 %
NRBC: 0 % (ref 0.0–0.2)
Neutro Abs: 10.8 10*3/uL — ABNORMAL HIGH (ref 1.7–7.7)
Neutrophils Relative %: 82 %
Platelets: 524 10*3/uL — ABNORMAL HIGH (ref 150–400)
RBC: 2.97 MIL/uL — ABNORMAL LOW (ref 4.22–5.81)
RDW: 15 % (ref 11.5–15.5)
WBC: 13.1 10*3/uL — ABNORMAL HIGH (ref 4.0–10.5)

## 2018-09-19 MED ORDER — ENSURE ENLIVE PO LIQD
237.0000 mL | Freq: Two times a day (BID) | ORAL | Status: DC
  Administered 2018-09-19 – 2018-09-24 (×11): 237 mL via ORAL

## 2018-09-19 MED ORDER — IPRATROPIUM-ALBUTEROL 0.5-2.5 (3) MG/3ML IN SOLN
3.0000 mL | Freq: Four times a day (QID) | RESPIRATORY_TRACT | Status: DC
  Administered 2018-09-19: 3 mL via RESPIRATORY_TRACT
  Filled 2018-09-19: qty 3

## 2018-09-19 MED ORDER — IPRATROPIUM-ALBUTEROL 0.5-2.5 (3) MG/3ML IN SOLN
3.0000 mL | Freq: Three times a day (TID) | RESPIRATORY_TRACT | Status: DC
  Administered 2018-09-20 – 2018-09-25 (×16): 3 mL via RESPIRATORY_TRACT
  Filled 2018-09-19 (×16): qty 3

## 2018-09-19 MED ORDER — ENOXAPARIN SODIUM 40 MG/0.4ML ~~LOC~~ SOLN: 40.0000 mg | SUBCUTANEOUS | Status: DC

## 2018-09-19 MED ORDER — ADULT MULTIVITAMIN W/MINERALS CH
1.0000 | ORAL_TABLET | Freq: Every day | ORAL | Status: DC
  Administered 2018-09-19 – 2018-09-25 (×7): 1 via ORAL
  Filled 2018-09-19 (×7): qty 1

## 2018-09-19 MED ORDER — SODIUM CHLORIDE 0.9 % IV SOLN
INTRAVENOUS | Status: AC
  Administered 2018-09-19: 17:00:00 via INTRAVENOUS

## 2018-09-19 MED ORDER — ACETAMINOPHEN 325 MG PO TABS
650.0000 mg | ORAL_TABLET | Freq: Four times a day (QID) | ORAL | Status: DC | PRN
  Administered 2018-09-19 – 2018-09-20 (×2): 650 mg via ORAL
  Filled 2018-09-19 (×2): qty 2

## 2018-09-19 MED ORDER — ORAL CARE MOUTH RINSE
15.0000 mL | Freq: Two times a day (BID) | OROMUCOSAL | Status: DC
  Administered 2018-09-19 – 2018-09-20 (×3): 15 mL via OROMUCOSAL

## 2018-09-19 NOTE — H&P (Signed)
History and Physical    Brian Ramirez AOZ:[id number] DOB: March 05, 1946 DOA: 09/19/2018  I have briefly reviewed the patient's prior medical records in [place]  PCP: [name], MD  Patient coming from: home   Chief Complaint: Generalized weakness  HPI: Brian Ramirez is a 73 y.o. male with medical history significant of recently diagnosed lung cancer with large recurrent right-sided pleural effusion status post Pleurx catheter placed about a week ago, who was recently hospitalized for hypoxic respiratory failure, treated for pneumonia with antibiotics as well as had a Pleurx placed for malignant pleural effusion.  During that admission CT angiogram in the ED showed 2 right-sided lung masses proven to be malignant.  During the hospital stay there was a question about whether patient is able to return home versus SNF, however patient preferred that he returns home.  Ever since being at home for the past 5 to 6 days, he has had difficulties with minimal ambulation, persistently short of breath, and unable to get up stairs to his bedroom.  He had some home health help which assisted him with draining the Pleurx catheter, removing 1 L of bloody fluid every other day.  He denies any frank fevers or chills, he did report a low-grade temp of 99.1.  He complains of a dry cough without much sputum production. He was evaluated in the hospital by and followed with Dr. [name] as an outpatient today, and felt like the patient has had progressive failure to thrive at home, significantly short of breath, and was directed to be admitted.  ED Course: Patient was direct admitted  Review of Systems: As per HPI otherwise 10 point review of systems negative.   Past Medical History:  Diagnosis Date  . Hernia 2012  . History of hiatal hernia   . History of kidney stones   . Personal history of kidney stones 1992    Past Surgical History:  Procedure Laterality Date  . HERNIA REPAIR  2012   inguinal  . INGUINAL HERNIA REPAIR Left 11/22/2016   Procedure: LAPAROSCOPIC  REPAIR OF LEFT INGUINAL HERNIA WITH MESH;  Surgeon: [name], MD;  Location: [facility];  Service: General;  Laterality: Left;  . IR PERC PLEURAL DRAIN W/INDWELL CATH W/IMG GUIDE  09/11/2018     reports that he has never smoked. He has never used smokeless tobacco. He reports current alcohol use of about 1.0 standard drinks of alcohol per week. He reports that he does not use drugs.  No Known Allergies  Family history reviewed and noncontributory  Prior to Admission medications   Medication Sig Start Date End Date Taking? Authorizing Provider  Multiple Vitamin (MULTIVITAMIN WITH MINERALS) TABS tablet Take 1 tablet by mouth daily.   Yes [provider]    Physical Exam: Vitals:   09/19/18 1600  BP: 118/68  Pulse: 97  Resp: 20  Temp: 98.4 F (36.9 C)  TempSrc: Oral   Constitutional: NAD, calm, comfortable Eyes: PERRL, lids and conjunctivae normal ENMT: Mucous membranes are moist.  Neck: normal, supple Respiratory: clear to auscultation bilaterally, scant end expiratory wheezing, no crackles. Normal respiratory effort. No accessory muscle use.  Cardiovascular: Regular rate and rhythm, no murmurs / rubs / gallops. No extremity edema. 2+ pedal pulses.  Abdomen: no tenderness, no masses palpated.  Musculoskeletal: no clubbing / cyanosis Skin: no rashes, skin breakdown (stage I) over sacral area Neurologic: CN 2-12 grossly intact. Strength 5/5 in all 4.  Psychiatric: Normal judgment and insight. Alert and oriented  x 3. Normal mood.   Labs on Admission: I have personally reviewed following labs and imaging studies  CBC: Recent Labs  Lab 09/19/18 1458  WBC 13.1*  NEUTROABS 10.8*  HGB 7.8*  HCT 26.1*  MCV 87.9  PLT 947*   Basic Metabolic Panel: Recent Labs  Lab 09/19/18 1458  NA 135  K 4.0  CL 99  CO2 32  GLUCOSE 117*  BUN 13  CREATININE 0.66  CALCIUM 7.8*   GFR: Estimated  Creatinine Clearance: 86.6 mL/min (by C-G formula based on SCr of 0.66 mg/dL). Liver Function Tests: Recent Labs  Lab 09/19/18 1458  AST 39  ALT 32  ALKPHOS 62  BILITOT 0.4  PROT 5.9*  ALBUMIN 1.6*   No results for input(s): LIPASE, AMYLASE in the last 168 hours. No results for input(s): AMMONIA in the last 168 hours. Coagulation Profile: No results for input(s): INR, PROTIME in the last 168 hours. Cardiac Enzymes: No results for input(s): CKTOTAL, CKMB, CKMBINDEX, TROPONINI in the last 168 hours. BNP (last 3 results) No results for input(s): PROBNP in the last 8760 hours. HbA1C: No results for input(s): HGBA1C in the last 72 hours. CBG: No results for input(s): GLUCAP in the last 168 hours. Lipid Profile: No results for input(s): CHOL, HDL, LDLCALC, TRIG, CHOLHDL, LDLDIRECT in the last 72 hours. Thyroid Function Tests: No results for input(s): TSH, T4TOTAL, FREET4, T3FREE, THYROIDAB in the last 72 hours. Anemia Panel: No results for input(s): VITAMINB12, FOLATE, FERRITIN, TIBC, IRON, RETICCTPCT in the last 72 hours. Urine analysis:    Component Value Date/Time   COLORURINE YELLOW 09/11/2018 0849   APPEARANCEUR HAZY (A) 09/11/2018 0849   LABSPEC 1.021 09/11/2018 0849   PHURINE 5.0 09/11/2018 0849   GLUCOSEU NEGATIVE 09/11/2018 0849   HGBUR NEGATIVE 09/11/2018 0849   BILIRUBINUR NEGATIVE 09/11/2018 0849   KETONESUR 20 (A) 09/11/2018 0849   PROTEINUR 30 (A) 09/11/2018 0849   NITRITE NEGATIVE 09/11/2018 0849   LEUKOCYTESUR NEGATIVE 09/11/2018 0849     Radiological Exams on Admission: Vas [place] Lower Extremity Venous (dvt)  Result Date: 09/19/2018  Lower Venous Study Indications: Edema.  Performing Technologist: June Leap RDMS, RVT  Examination Guidelines: A complete evaluation includes B-mode imaging, spectral Doppler, color Doppler, and power Doppler as needed of all accessible portions of each vessel. Bilateral testing is considered an integral part of a complete  examination. Limited examinations for reoccurring indications may be performed as noted.  Right Venous Findings: +---------+---------------+---------+-----------+----------+-------+          CompressibilityPhasicitySpontaneityPropertiesSummary +---------+---------------+---------+-----------+----------+-------+ CFV      Full           Yes      Yes                          +---------+---------------+---------+-----------+----------+-------+ SFJ      Full                                                 +---------+---------------+---------+-----------+----------+-------+ FV Prox  Full                                                 +---------+---------------+---------+-----------+----------+-------+ FV Mid   Full                                                 +---------+---------------+---------+-----------+----------+-------+  FV DistalFull                                                 +---------+---------------+---------+-----------+----------+-------+ PFV      Full                                                 +---------+---------------+---------+-----------+----------+-------+ POP      Full           Yes      Yes                          +---------+---------------+---------+-----------+----------+-------+ PTV      Full                                                 +---------+---------------+---------+-----------+----------+-------+ PERO     Full                                                 +---------+---------------+---------+-----------+----------+-------+  Left Venous Findings: +---------+---------------+---------+-----------+----------+-------+          CompressibilityPhasicitySpontaneityPropertiesSummary +---------+---------------+---------+-----------+----------+-------+ CFV      Full           Yes      Yes                          +---------+---------------+---------+-----------+----------+-------+ SFJ      Full                                                  +---------+---------------+---------+-----------+----------+-------+ FV Prox  Full                                                 +---------+---------------+---------+-----------+----------+-------+ FV Mid   Full                                                 +---------+---------------+---------+-----------+----------+-------+ FV DistalFull                                                 +---------+---------------+---------+-----------+----------+-------+ PFV      Full                                                 +---------+---------------+---------+-----------+----------+-------+  POP      Full           Yes      Yes                          +---------+---------------+---------+-----------+----------+-------+ PTV      Full                                                 +---------+---------------+---------+-----------+----------+-------+ PERO     Full                                                 +---------+---------------+---------+-----------+----------+-------+    Summary: Right: There is no evidence of deep vein thrombosis in the lower extremity. No cystic structure found in the popliteal fossa. Left: There is no evidence of deep vein thrombosis in the lower extremity. No cystic structure found in the popliteal fossa.  *See table(s) above for measurements and observations.    Preliminary      Assessment/Plan Active Problems:   Pleural effusion   Mass of right lung   Dyspnea   Principal Problem Generalized weakness/shortness of breath /acute on chronic hypoxic respiratory failure in the setting of stage IV lung cancer / FTT -Patient with significant deconditioning, has been having difficulties with dyspnea and has been fairly non-mobile at home. -We will drain Pleurx catheter, repeat chest x-ray to reevaluate -Dr. [name] with oncology will follow as well. -Very poor p.o. intake at home, will  place on IV fluids overnight  Active Problems Anemia -Likely multifactorial in the setting of malignancy as well as bloody pleural effusion -Hemoglobin 7.8, will continue to monitor, does not need transfusion right now  Recent diagnosis of left perihilar pneumonia -He completed a course of antibiotics, currently he is afebrile, cough is without significant mucus production, repeat chest x-ray for now and clinically monitor  Bilateral lower extremity swelling -Ultrasound negative for DVT  Disposition -PT has been consulted, patient may benefit from SNF   DVT prophylaxis: SCDs  Code Status: Full code  Family Communication: no family at bedside  Disposition Plan: TBD Consults called: Oncology, Dr. Hart Robinsons, MD, PhD [facility]  Contact via [email or web address]  [facility] Office Info P: [phone]  F: [phone]   09/19/2018, 4:26 PM

## 2018-09-19 NOTE — Progress Notes (Signed)
Patient was discharged home per his request and was transported to office today via non emergency ambulance because he can't walk few steps without extreme shortness of breath even on oxygen at 3 l/min. He has 8 stairs to get out his home and is not able to negotiate the steps. He has had nurse visit twice weekly to drain his PleuRx and aid daily for few hours who will help with bath and fix snacks for him to eat. Has been sleeping on sofa with urinal beside him. Awaiting hospital bed to be delivered. Reports it can take 45 minutes to get to his fridge and back to get a cup of yogurt. Reports the beginning of a sacral decubitus as well. He agrees that being at home is not working or safe and agrees to admission to a rehab facility. We will work an admission to [facility] and proceed with placement from there. Paged hospitalist # for admission and left Dr. Gearldine Shown pager for Dr. Renne Crigler to discuss case with Dr. [name]. Called CSW, Claire Shown [phone]) with [facility] and made her aware of the admission and to d/c home care services.

## 2018-09-19 NOTE — Progress Notes (Signed)
[facility] OFFICE PROGRESS NOTE   Diagnosis: Non-small cell lung cancer  INTERVAL HISTORY:   Brian Ramirez was discharged from the hospital 09/13/2018 after admission with dyspnea.  He was diagnosed with non-small cell lung cancer on biopsy of a right chest mass.  He has a bloody right pleural effusion.  A Pleurx was placed 09/11/2018.  He was discharged to home.  He lives alone.  Home nursing has been helping with drainage of the Pleurx.  He reports 1 L of bloody drainage every other day.  He continues to have dyspnea.  He is only able to ambulate a short distance secondary to dyspnea.  He is having difficulty taking care of himself. He had a fever yesterday.  No pain.  Objective:  Vital signs in last 24 hours:  Blood pressure 120/70, pulse 98, temperature (!) 97.5 F (36.4 C), resp. rate 20, height 6\' 1"  (1.854 m), SpO2 96 %.    HEENT: No thrush Resp: Inspiratory wheeze throughout the left chest, decreased breath sounds at the right chest, increased respiratory rate Cardio: Regular rate and rhythm GI: No hepatosplenomegaly, nontender Vascular: 1+ edema at the left lower leg Neuro: Alert, follows commands Skin: Early sacral decubitus   Lab Results:  Lab Results  Component Value Date   WBC 7.6 09/12/2018   HGB 8.7 (L) 09/12/2018   HCT 29.0 (L) 09/12/2018   MCV 90.6 09/12/2018   PLT 394 09/12/2018   NEUTROABS 5.3 09/11/2018    CMP  Lab Results  Component Value Date   NA 136 09/12/2018   K 3.9 09/12/2018   CL 104 09/12/2018   CO2 26 09/12/2018   GLUCOSE 103 (H) 09/12/2018   BUN 12 09/12/2018   CREATININE 0.63 09/12/2018   CALCIUM 7.8 (L) 09/12/2018   PROT 6.0 (L) 09/05/2018   ALBUMIN 2.2 (L) 09/05/2018   AST 28 09/05/2018   ALT 24 09/05/2018   ALKPHOS 57 09/05/2018   BILITOT 0.8 09/05/2018   GFRNONAA >60 09/12/2018   GFRAA >60 09/12/2018     Medications: I have reviewed the patient's current medications.   Assessment/Plan: 1.  Metastatic  non-small cell lung cancer  large right pleural effusion, right lung mass, right pleural-based masses, left lung nodules  Right thoracentesis 09/04/2018-negative cytology  CT biopsy of right lateral pleural mass 09/07/2018- poorly differentiated non-small cell carcinoma, malignant cells positive for cytokeratin AE1/AE3 and cytokeratin 8/18, histology favoring adenocarcinoma  2.Severe anemia-likely secondary to bleeding in the right pleural space and metastatic carcinoma, red cell transfusions 09/09/2018 09/10/2018 3.Dyspnea secondary to the large right pleural effusion and anemia  Right Pleurx catheter placed 09/11/2018 4.History of tobacco use 5.History of kidney stones 6. Fever    Disposition: [name] has been diagnosed with metastatic non-small cell carcinoma.  This is likely secondary to a lung primary.  He remains symptomatic with dyspnea and failure to thrive.  He can no longer live alone.  He agrees to hospital admission for further evaluation and to consider skilled nursing facility placement.   The tumor proportion score returned at 100% on the right pleural mass biopsy.  He will be a candidate for immunotherapy if his performance status improves.  He is not a candidate for chemotherapy at present.  I contacted the hospitalist service to arrange for hospital admission today.  Recommendations: 1.  Chest x-ray, continue drainage of the right pleural effusion 2.  Doppler of lower extremities to rule out DVT 3.  Care management consult to consider placement options 4.  Discussions with Mr. Monger regarding CODE STATUS 5.  Plan for pembrolizumab therapy if his performance status allows 6.  Check CBC and arrange for red cell transfusion support as needed 7.  Skin care consult  I appreciate the care from the internal medicine service.  I will follow Mr. Campion in the hospital.   Betsy Coder, MD  09/19/2018  1:42 PM

## 2018-09-19 NOTE — Progress Notes (Signed)
LE Venous duplex       has been completed. Preliminary results can be found under CV proc through chart review. June Leap, BS, RDMS, RVT

## 2018-09-20 ENCOUNTER — Other Ambulatory Visit: Payer: Self-pay | Admitting: Oncology

## 2018-09-20 DIAGNOSIS — E440 Moderate protein-calorie malnutrition: Secondary | ICD-10-CM

## 2018-09-20 DIAGNOSIS — C3490 Malignant neoplasm of unspecified part of unspecified bronchus or lung: Secondary | ICD-10-CM

## 2018-09-20 DIAGNOSIS — R627 Adult failure to thrive: Secondary | ICD-10-CM

## 2018-09-20 DIAGNOSIS — Z87442 Personal history of urinary calculi: Secondary | ICD-10-CM

## 2018-09-20 DIAGNOSIS — Z6821 Body mass index (BMI) 21.0-21.9, adult: Secondary | ICD-10-CM

## 2018-09-20 DIAGNOSIS — D638 Anemia in other chronic diseases classified elsewhere: Secondary | ICD-10-CM

## 2018-09-20 DIAGNOSIS — L8990 Pressure ulcer of unspecified site, unspecified stage: Secondary | ICD-10-CM

## 2018-09-20 DIAGNOSIS — D630 Anemia in neoplastic disease: Secondary | ICD-10-CM

## 2018-09-20 DIAGNOSIS — J90 Pleural effusion, not elsewhere classified: Secondary | ICD-10-CM

## 2018-09-20 DIAGNOSIS — Z7189 Other specified counseling: Secondary | ICD-10-CM | POA: Insufficient documentation

## 2018-09-20 DIAGNOSIS — R5081 Fever presenting with conditions classified elsewhere: Secondary | ICD-10-CM

## 2018-09-20 DIAGNOSIS — R531 Weakness: Secondary | ICD-10-CM

## 2018-09-20 DIAGNOSIS — Z87891 Personal history of nicotine dependence: Secondary | ICD-10-CM

## 2018-09-20 DIAGNOSIS — R0603 Acute respiratory distress: Secondary | ICD-10-CM

## 2018-09-20 HISTORY — DX: Moderate protein-calorie malnutrition: E44.0

## 2018-09-20 HISTORY — DX: Pressure ulcer of unspecified site, unspecified stage: L89.90

## 2018-09-20 HISTORY — DX: Other specified counseling: Z71.89

## 2018-09-20 HISTORY — DX: Malignant neoplasm of unspecified part of unspecified bronchus or lung: C34.90

## 2018-09-20 LAB — BASIC METABOLIC PANEL
Anion gap: 4 — ABNORMAL LOW (ref 5–15)
BUN: 12 mg/dL (ref 8–23)
CHLORIDE: 103 mmol/L (ref 98–111)
CO2: 29 mmol/L (ref 22–32)
Calcium: 7.4 mg/dL — ABNORMAL LOW (ref 8.9–10.3)
Creatinine, Ser: 0.55 mg/dL — ABNORMAL LOW (ref 0.61–1.24)
GFR calc Af Amer: >60 mL/min (ref >60)
GFR calc non Af Amer: >60 mL/min (ref >60)
Glucose, Bld: 101 mg/dL — ABNORMAL HIGH (ref 70–99)
Potassium: 3.8 mmol/L (ref 3.5–5.1)
Sodium: 136 mmol/L (ref 135–145)

## 2018-09-20 LAB — CBC
HEMATOCRIT: 23.8 % — AB (ref 39.0–52.0)
Hemoglobin: 7.1 g/dL — ABNORMAL LOW (ref 13.0–17.0)
MCH: 26.2 pg (ref 26.0–34.0)
MCHC: 29.8 g/dL — ABNORMAL LOW (ref 30.0–36.0)
MCV: 87.8 fL (ref 80.0–100.0)
Platelets: 431 10*3/uL — ABNORMAL HIGH (ref 150–400)
RBC: 2.71 MIL/uL — ABNORMAL LOW (ref 4.22–5.81)
RDW: 15 % (ref 11.5–15.5)
WBC: 10.9 10*3/uL — ABNORMAL HIGH (ref 4.0–10.5)
nRBC: 0 % (ref 0.0–0.2)

## 2018-09-20 LAB — PREPARE RBC (CROSSMATCH)

## 2018-09-20 MED ORDER — SODIUM CHLORIDE 0.9% IV SOLUTION
Freq: Once | INTRAVENOUS | Status: AC
  Administered 2018-09-20: 10:00:00 via INTRAVENOUS

## 2018-09-20 MED ORDER — SENNOSIDES-DOCUSATE SODIUM 8.6-50 MG PO TABS: 2.0000 | ORAL_TABLET | Freq: Every evening | ORAL | Status: DC | PRN

## 2018-09-20 MED ORDER — POLYETHYLENE GLYCOL 3350 17 G PO PACK
17.0000 g | PACK | Freq: Every day | ORAL | Status: DC | PRN
  Administered 2018-09-24: 17 g via ORAL
  Filled 2018-09-20: qty 1

## 2018-09-20 MED ORDER — OXYCODONE HCL 5 MG PO TABS
5.0000 mg | ORAL_TABLET | ORAL | Status: DC | PRN
  Administered 2018-09-20 – 2018-09-25 (×6): 5 mg via ORAL
  Filled 2018-09-20 (×6): qty 1

## 2018-09-20 NOTE — Progress Notes (Addendum)
IP PROGRESS NOTE  Subjective:   Brian Ramirez was admitted yesterday with dyspnea and failure to thrive.  He feels partially better.  He continues to have pain at the sacrum.  He requests a "doughnut ".  He felt better after the Pleurx was drained yesterday.  Objective: Vital signs in last 24 hours: Blood pressure 118/71, pulse (!) 112, temperature 98 F (36.7 C), temperature source Axillary, resp. rate 20, height _0  (1.854 m), weight 159 lb 6.3 oz (72.3 kg), SpO2 95 %.  Intake/Output from previous day: 02/05 0701 - 02/06 0700 In: 857.5 [I.V.:857.5] Out: 1750 [Urine:800; Chest Tube:950]  Physical Exam:  HEENT: No thrush Lungs: Bilateral wheeze, decreased breath sounds throughout the right chest Cardiac: Regular rate and rhythm Abdomen: No hepatosplenomegaly Extremities: Pitting edema at the lower leg and foot bilaterally  Right Pleurx catheter site with a gauze dressing  Lab Results: Recent Labs    09/19/18 1458 09/20/18 0328  WBC 13.1* 10.9*  HGB 7.8* 7.1*  HCT 26.1* 23.8*  PLT 524* 431*    BMET Recent Labs    09/19/18 1458 09/20/18 0328  NA 135 136  K 4.0 3.8  CL 99 103  CO2 32 29  GLUCOSE 117* 101*  BUN 13 12  CREATININE 0.66 0.55*  CALCIUM 7.8* 7.4*    No results found for: CEA1  Studies/Results: Dg Chest 2 View  Result Date: 09/19/2018 CLINICAL DATA:  Dyspnea. History of lung cancer with recurrent RIGHT pleural effusion, PleurX catheter placed 1 week ago. EXAM: CHEST - 2 VIEW COMPARISON:  Chest radiograph September 11, 2018 FINDINGS: Slightly decreased residual moderate to large RIGHT pleural effusion with pleural vac catheter projecting in RIGHT lung base. Interstitial prominence with scattered nodular densities. RIGHT upper lobe density at site of known mass. Cardiac silhouette predominantly obscured. Calcified aortic arch. No pneumothorax. IMPRESSION: 1. Moderate to large residual RIGHT pleural effusion with pleural vac catheter in place. 2.  Aortic  Atherosclerosis (ICD10-I70.0). Electronically Signed   By: Elon Alas M.D.   On: 09/19/2018 16:56   Vas [place] Lower Extremity Venous (dvt)  Result Date: 09/19/2018  Lower Venous Study Indications: Edema.  Performing Technologist: June Leap RDMS, RVT  Examination Guidelines: A complete evaluation includes B-mode imaging, spectral Doppler, color Doppler, and power Doppler as needed of all accessible portions of each vessel. Bilateral testing is considered an integral part of a complete examination. Limited examinations for reoccurring indications may be performed as noted.  Right Venous Findings: +---------+---------------+---------+-----------+----------+-------+          CompressibilityPhasicitySpontaneityPropertiesSummary +---------+---------------+---------+-----------+----------+-------+ CFV      Full           Yes      Yes                          +---------+---------------+---------+-----------+----------+-------+ SFJ      Full                                                 +---------+---------------+---------+-----------+----------+-------+ FV Prox  Full                                                 +---------+---------------+---------+-----------+----------+-------+ FV Mid  Full                                                 +---------+---------------+---------+-----------+----------+-------+ FV DistalFull                                                 +---------+---------------+---------+-----------+----------+-------+ PFV      Full                                                 +---------+---------------+---------+-----------+----------+-------+ POP      Full           Yes      Yes                          +---------+---------------+---------+-----------+----------+-------+ PTV      Full                                                 +---------+---------------+---------+-----------+----------+-------+ PERO     Full                                                  +---------+---------------+---------+-----------+----------+-------+  Left Venous Findings: +---------+---------------+---------+-----------+----------+-------+          CompressibilityPhasicitySpontaneityPropertiesSummary +---------+---------------+---------+-----------+----------+-------+ CFV      Full           Yes      Yes                          +---------+---------------+---------+-----------+----------+-------+ SFJ      Full                                                 +---------+---------------+---------+-----------+----------+-------+ FV Prox  Full                                                 +---------+---------------+---------+-----------+----------+-------+ FV Mid   Full                                                 +---------+---------------+---------+-----------+----------+-------+ FV DistalFull                                                 +---------+---------------+---------+-----------+----------+-------+ PFV        Full                                                 +---------+---------------+---------+-----------+----------+-------+ POP      Full           Yes      Yes                          +---------+---------------+---------+-----------+----------+-------+ PTV      Full                                                 +---------+---------------+---------+-----------+----------+-------+ PERO     Full                                                 +---------+---------------+---------+-----------+----------+-------+    Summary: Right: There is no evidence of deep vein thrombosis in the lower extremity. No cystic structure found in the popliteal fossa. Left: There is no evidence of deep vein thrombosis in the lower extremity. No cystic structure found in the popliteal fossa.  *See table(s) above for measurements and observations. Electronically signed by Curt Jews MD on  09/19/2018 at 9:42:06 PM.    Final     Medications: I have reviewed the patient's current medications.  Assessment/Plan:  1.  Metastatic non-small cell lung cancer  large right pleural effusion, right lung mass, right pleural-based masses, left lung nodules  Right thoracentesis 09/04/2018-negative cytology  CT biopsy of right lateral pleural mass 09/07/2018-poorly differentiated non-small cell carcinoma, malignant cells positive for cytokeratin AE1/AE3 and cytokeratin 8/18, histology favoring adenocarcinoma, MSS, tumor mutation burden 5.no EGFR, ALK, BRAF alteration  PDL 1 tumor proportion score-100%  2.Severe anemia-likely secondary to bleeding in the right pleural space and metastatic carcinoma, red cell transfusions 09/09/2018 09/10/2018 3.Dyspnea secondary to the large right pleural effusion and anemia  Right Pleurx catheter placed 09/11/2018 4.History of tobacco use 5.History of kidney stones 6. Fever  Brian Ramirez appears partially improved today.  He continues to have a poor performance status secondary to the metastatic tumor burden and dyspnea.  The plan is to begin systemic therapy with pembrolizumab when we can get approval for this agent.  I will check into the possibility of giving this as an inpatient. I reviewed potential toxicities associated with pembrolizumab.  He agrees to proceed.  He will need skilled nursing facility placement with ability to transport to the Cancer center.  Recommendations: 1.  Drain Pleurx catheter daily 2.  Pembrolizumab when approved by insurance 3.  Skin care for the sacral decubitus 4.  Transfuse packed red blood cells if hemoglobin falls further   LOS: 1 day   Betsy Coder, MD   09/20/2018, 3:32 PM

## 2018-09-20 NOTE — Progress Notes (Signed)
PROGRESS NOTE    Brian Ramirez  EHU:[id number] DOB: Jan 11, 1946 DOA: 09/19/2018 PCP: [name], MD   Brief Narrative:  73 year old with history of lung cancer with recurrent large right-sided pleural effusion with recent Pleurx catheter placement presents to the hospital with hypoxic respiratory failure.  Patient was recently treated for malignant pleural effusion with antibiotics and Pleurx catheter was placed.  Even after going home he became progressively short of breath therefore return to Dr. Anne Hahn office outpatient and was sent to the hospital for admission.   Assessment & Plan:   Active Problems:   Pleural effusion   Mass of right lung   Dyspnea   Pressure injury of skin   Malnutrition of moderate degree  Acute respiratory distress with hypoxia secondary to right-sided large pleural effusion Malignant right-sided pleural effusion Stage IV lung cancer - At this point we will continue drainage from Pleurx catheter as necessary.  Oncology team is following, plans for immunotherapy eventually.  Not a candidate for chemotherapy -Supplemental oxygen as needed -Supportive care, encourage p.o. intake  Anemia of chronic disease - Partly worsening his shortness of breath.  Baseline hemoglobin 9, today 7.1.  Will transfuse 2 units PRBC.  Generalized weakness and deconditioning - This is secondary to his pulmonary condition.  Physical therapy recommends skilled nursing facility.  PT/OT evaluation performed.  SNF recommended.  SNF appropriate as the patient has received 3 days of hospital care (or the 3 days stay Has been made by the patient's insurance company) and is felt to need rehab services to restore this patient to their prior level of function to achieve safe transition back to home care.  This patient needs rehab services for at least 5 days/week and skilled nursing services daily to facilitate this transition.  Rehab is been requested as the most appropriate discharge  option for this patient and is not felt to be custodial care as evidenced by malignant right-sided pleural effusion causing generalized weakness which is worsened by poor oral intake.  No help at all at home.  Patient lives alone.  Mild to moderate protein calorie malnutrition -Encourage oral diet  Bilateral lower extremity swelling -Negative for DVT   DVT prophylaxis: SCDs Code Status: Full code Family Communication: None at bedside Disposition Plan: Maintain inpatient stay for hydration  Consultants:   Oncology  Procedures:   None  Antimicrobials:   None   Subjective: Patient still has quite a bit of exertional shortness of breath.  Review of Systems Otherwise negative except as per HPI, including: General: Denies fever, chills, night sweats or unintended weight loss. Resp: Denies cough, wheezing, shortness of breath. Cardiac: Denies chest pain, palpitations, orthopnea, paroxysmal nocturnal dyspnea. GI: Denies abdominal pain, nausea, vomiting, diarrhea or constipation GU: Denies dysuria, frequency, hesitancy or incontinence MS: Denies muscle aches, joint pain or swelling Neuro: Denies headache, neurologic deficits (focal weakness, numbness, tingling), abnormal gait Psych: Denies anxiety, depression, SI/HI/AVH Skin: Denies new rashes or lesions ID: Denies sick contacts, exotic exposures, travel  Objective: Vitals:   09/20/18 0800 09/20/18 0954 09/20/18 1019 09/20/18 1312  BP:  (!) 110/56 110/61 120/70  Pulse:  84 95 96  Resp:  20 18 20   Temp:  98.9 F (37.2 C) 98.9 F (37.2 C) 98.3 F (36.8 C)  TempSrc:  Oral Oral Oral  SpO2: 91% 94% 96% 95%  Weight:      Height:        Intake/Output Summary (Last 24 hours) at 09/20/2018 1354 Last data filed at 09/20/2018 0900  Gross per 24 hour  Intake 1097.5 ml  Output 2100 ml  Net -1002.5 ml   Filed Weights   09/19/18 1600  Weight: 72.3 kg    Examination:  General exam: Appears calm and comfortable  Respiratory  system: Diminished breath sounds on the right side Cardiovascular system: S1 & S2 heard, RRR. No JVD, murmurs, rubs, gallops or clicks. No pedal edema. Gastrointestinal system: Abdomen is nondistended, soft and nontender. No organomegaly or masses felt. Normal bowel sounds heard. Central nervous system: Alert and oriented. No focal neurological deficits. Extremities: Symmetric 4 x 5 power. Skin: No rashes, lesions or ulcers Psychiatry: Judgement and insight appear normal. Mood & affect appropriate.  Pleurx catheter noted   Data Reviewed:   CBC: Recent Labs  Lab 09/19/18 1458 09/20/18 0328  WBC 13.1* 10.9*  NEUTROABS 10.8*  --   HGB 7.8* 7.1*  HCT 26.1* 23.8*  MCV 87.9 87.8  PLT 524* 607*   Basic Metabolic Panel: Recent Labs  Lab 09/19/18 1458 09/20/18 0328  NA 135 136  K 4.0 3.8  CL 99 103  CO2 32 29  GLUCOSE 117* 101*  BUN 13 12  CREATININE 0.66 0.55*  CALCIUM 7.8* 7.4*   GFR: Estimated Creatinine Clearance: 86.6 mL/min (A) (by C-G formula based on SCr of 0.55 mg/dL (L)). Liver Function Tests: Recent Labs  Lab 09/19/18 1458  AST 39  ALT 32  ALKPHOS 62  BILITOT 0.4  PROT 5.9*  ALBUMIN 1.6*   No results for input(s): LIPASE, AMYLASE in the last 168 hours. No results for input(s): AMMONIA in the last 168 hours. Coagulation Profile: No results for input(s): INR, PROTIME in the last 168 hours. Cardiac Enzymes: No results for input(s): CKTOTAL, CKMB, CKMBINDEX, TROPONINI in the last 168 hours. BNP (last 3 results) No results for input(s): PROBNP in the last 8760 hours. HbA1C: No results for input(s): HGBA1C in the last 72 hours. CBG: No results for input(s): GLUCAP in the last 168 hours. Lipid Profile: No results for input(s): CHOL, HDL, LDLCALC, TRIG, CHOLHDL, LDLDIRECT in the last 72 hours. Thyroid Function Tests: No results for input(s): TSH, T4TOTAL, FREET4, T3FREE, THYROIDAB in the last 72 hours. Anemia Panel: No results for input(s): VITAMINB12,  FOLATE, FERRITIN, TIBC, IRON, RETICCTPCT in the last 72 hours. Sepsis Labs: No results for input(s): PROCALCITON, LATICACIDVEN in the last 168 hours.  Recent Results (from the past 240 hour(s))  Culture, blood (routine x 2)     Status: None   Collection Time: 09/11/18  7:01 AM  Result Value Ref Range Status   Specimen Description BLOOD RIGHT HAND  Final   Special Requests   Final    BOTTLES DRAWN AEROBIC AND ANAEROBIC Blood Culture adequate volume Performed at [facility] [address], [place], [place] [address]    Culture NO GROWTH 5 DAYS  Final   Report Status 09/16/2018 FINAL  Final  Culture, blood (routine x 2)     Status: None   Collection Time: 09/11/18  7:01 AM  Result Value Ref Range Status   Specimen Description BLOOD LEFT HAND  Final   Special Requests   Final    BOTTLES DRAWN AEROBIC AND ANAEROBIC Blood Culture adequate volume Performed at [facility] [address], [place], [place] [address]    Culture NO GROWTH 5 DAYS  Final   Report Status 09/16/2018 FINAL  Final         Radiology Studies: Dg Chest 2 View  Result Date: 09/19/2018  CLINICAL DATA:  Dyspnea. History of lung cancer with recurrent RIGHT pleural effusion, PleurX catheter placed 1 week ago. EXAM: CHEST - 2 VIEW COMPARISON:  Chest radiograph September 11, 2018 FINDINGS: Slightly decreased residual moderate to large RIGHT pleural effusion with pleural vac catheter projecting in RIGHT lung base. Interstitial prominence with scattered nodular densities. RIGHT upper lobe density at site of known mass. Cardiac silhouette predominantly obscured. Calcified aortic arch. No pneumothorax. IMPRESSION: 1. Moderate to large residual RIGHT pleural effusion with pleural vac catheter in place. 2.  Aortic Atherosclerosis (ICD10-I70.0). Electronically Signed   By: Elon Alas M.D.   On: 09/19/2018 16:56   Vas [place] Lower Extremity Venous (dvt)  Result Date: 09/19/2018  Lower Venous  Study Indications: Edema.  Performing Technologist: June Leap RDMS, RVT  Examination Guidelines: A complete evaluation includes B-mode imaging, spectral Doppler, color Doppler, and power Doppler as needed of all accessible portions of each vessel. Bilateral testing is considered an integral part of a complete examination. Limited examinations for reoccurring indications may be performed as noted.  Right Venous Findings: +---------+---------------+---------+-----------+----------+-------+          CompressibilityPhasicitySpontaneityPropertiesSummary +---------+---------------+---------+-----------+----------+-------+ CFV      Full           Yes      Yes                          +---------+---------------+---------+-----------+----------+-------+ SFJ      Full                                                 +---------+---------------+---------+-----------+----------+-------+ FV Prox  Full                                                 +---------+---------------+---------+-----------+----------+-------+ FV Mid   Full                                                 +---------+---------------+---------+-----------+----------+-------+ FV DistalFull                                                 +---------+---------------+---------+-----------+----------+-------+ PFV      Full                                                 +---------+---------------+---------+-----------+----------+-------+ POP      Full           Yes      Yes                          +---------+---------------+---------+-----------+----------+-------+ PTV      Full                                                 +---------+---------------+---------+-----------+----------+-------+  PERO     Full                                                 +---------+---------------+---------+-----------+----------+-------+  Left Venous Findings:  +---------+---------------+---------+-----------+----------+-------+          CompressibilityPhasicitySpontaneityPropertiesSummary +---------+---------------+---------+-----------+----------+-------+ CFV      Full           Yes      Yes                          +---------+---------------+---------+-----------+----------+-------+ SFJ      Full                                                 +---------+---------------+---------+-----------+----------+-------+ FV Prox  Full                                                 +---------+---------------+---------+-----------+----------+-------+ FV Mid   Full                                                 +---------+---------------+---------+-----------+----------+-------+ FV DistalFull                                                 +---------+---------------+---------+-----------+----------+-------+ PFV      Full                                                 +---------+---------------+---------+-----------+----------+-------+ POP      Full           Yes      Yes                          +---------+---------------+---------+-----------+----------+-------+ PTV      Full                                                 +---------+---------------+---------+-----------+----------+-------+ PERO     Full                                                 +---------+---------------+---------+-----------+----------+-------+    Summary: Right: There is no evidence of deep vein thrombosis in the lower extremity. No cystic structure found in the popliteal fossa. Left: There is no evidence of deep vein thrombosis in the lower extremity. No cystic structure found in the popliteal fossa.  *  See table(s) above for measurements and observations. Electronically signed by Curt Jews MD on 09/19/2018 at 9:42:06 PM.    Final         Scheduled Meds: . feeding supplement (ENSURE ENLIVE)  237 mL Oral BID BM  .  ipratropium-albuterol  3 mL Nebulization TID  . mouth rinse  15 mL Mouth Rinse BID  . multivitamin with minerals  1 tablet Oral Daily   Continuous Infusions: . sodium chloride 75 mL/hr at 09/20/18 0444     LOS: 1 day   Time spent= 40 mins    Alvia Tory Arsenio Loader, MD [facility]  If 7PM-7AM, please contact night-coverage [email or web address] 09/20/2018, 1:54 PM

## 2018-09-20 NOTE — Progress Notes (Signed)
Initial Nutrition Assessment  DOCUMENTATION CODES:   Non-severe (moderate) malnutrition in context of chronic illness  INTERVENTION:    Continue Ensure Enlive po BID, each supplement provides 350 kcal and 20 grams of protein  NUTRITION DIAGNOSIS:   Moderate Malnutrition related to chronic illness, cancer and cancer related treatments as evidenced by mild fat depletion, mild muscle depletion, energy intake < or equal to 50% for > or equal to 5 days.  GOAL:   Patient will meet greater than or equal to 90% of their needs  MONITOR:   PO intake, Supplement acceptance, Labs, Weight trends, I & O's, Skin  REASON FOR ASSESSMENT:   Malnutrition Screening Tool    ASSESSMENT:    73 y.o. male with medical history significant of recently diagnosed lung cancer with large recurrent right-sided pleural effusion status post Pleurx catheter placed about a week ago, who was recently hospitalized for hypoxic respiratory failure, treated for pneumonia with antibiotics as well as had a Pleurx placed for malignant pleural effusion.  Admitted for  progressive failure to thrive at home and significantly short of breath.   Patient reports not eating well since he admitted previously in January (1/21). Since that admission his appetite has been poor and he has not been eating much. Subsisting mainly on boiled eggs, bananas and yogurt. Pt also is not fond of the hospital's food. Denies swallowing or chewing issues. Patient states prior to previous admission he was a strict eater and tried to eat healthy, aiming for ~2400 kcal/day. Pt has been consuming 100% of meals so far this admission.  Pt likes Ensure supplements and is drinking one now.   Per patient UBW is 165 lb. Pt states this was what he weighed prior to admission in January 2020. Pt has lost 6 lb since that time which isn't significant for time frame.  Labs reviewed. Medications: Multivitamin with minerals daily  NUTRITION - FOCUSED PHYSICAL  EXAM:    Most Recent Value  Orbital Region  No depletion  Upper Arm Region  Moderate depletion  Thoracic and Lumbar Region  Unable to assess  Buccal Region  Moderate depletion  Temple Region  Mild depletion  Clavicle Bone Region  Mild depletion  Clavicle and Acromion Bone Region  Mild depletion  Scapular Bone Region  Unable to assess  Dorsal Hand  Mild depletion  Patellar Region  Unable to assess  Anterior Thigh Region  Unable to assess  Posterior Calf Region  Unable to assess  Edema (RD Assessment)  Mild       Diet Order:   Diet Order            Diet regular Room service appropriate? Yes; Fluid consistency: Thin  Diet effective now              EDUCATION NEEDS:   Education needs have been addressed  Skin:  Skin Assessment: Skin Integrity Issues: Skin Integrity Issues:: Stage I Stage I: sacrum  Last BM:  2/4  Height:   Ht Readings from Last 1 Encounters:  09/19/18 6\' 1"  (1.854 m)    Weight:   Wt Readings from Last 1 Encounters:  09/19/18 72.3 kg    Ideal Body Weight:  83.6 kg  BMI:  Body mass index is 21.03 kg/m.  Estimated Nutritional Needs:   Kcal:  2200-2400  Protein:  110-120g  Fluid:  2.2L/day   Clayton Bibles, MS, RD, LDN [place] Dietitian Pager: [phone] After Hours Pager: [phone]

## 2018-09-20 NOTE — Progress Notes (Signed)
09/20/18: 2 PRBC TX today;  Pleurex drained: output 371ml serosanguinous/sanguious. Pt tolerated  PRBC TX, up to chair, and Pleurex cath drain well.
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
## 2018-09-21 DIAGNOSIS — C3491 Malignant neoplasm of unspecified part of right bronchus or lung: Principal | ICD-10-CM

## 2018-09-21 DIAGNOSIS — E860 Dehydration: Secondary | ICD-10-CM

## 2018-09-21 DIAGNOSIS — C799 Secondary malignant neoplasm of unspecified site: Secondary | ICD-10-CM

## 2018-09-21 LAB — BPAM RBC
Blood Product Expiration Date: 202003022359
Blood Product Expiration Date: 202003022359
ISSUE DATE / TIME: 202002060943
ISSUE DATE / TIME: 202002061406
Unit Type and Rh: 5100
Unit Type and Rh: 5100

## 2018-09-21 LAB — TYPE AND SCREEN
ABO/RH(D): O POS
ANTIBODY SCREEN: NEGATIVE
Unit division: 0
Unit division: 0

## 2018-09-21 LAB — MAGNESIUM: MAGNESIUM: 2 mg/dL (ref 1.7–2.4)

## 2018-09-21 LAB — CBC
HCT: 29.1 % — ABNORMAL LOW (ref 39.0–52.0)
Hemoglobin: 8.8 g/dL — ABNORMAL LOW (ref 13.0–17.0)
MCH: 26.5 pg (ref 26.0–34.0)
MCHC: 30.2 g/dL (ref 30.0–36.0)
MCV: 87.7 fL (ref 80.0–100.0)
Platelets: 419 10*3/uL — ABNORMAL HIGH (ref 150–400)
RBC: 3.32 MIL/uL — ABNORMAL LOW (ref 4.22–5.81)
RDW: 15.2 % (ref 11.5–15.5)
WBC: 13.9 10*3/uL — ABNORMAL HIGH (ref 4.0–10.5)
nRBC: 0 % (ref 0.0–0.2)

## 2018-09-21 LAB — BASIC METABOLIC PANEL
ANION GAP: 7 (ref 5–15)
BUN: 12 mg/dL (ref 8–23)
CALCIUM: 7.5 mg/dL — AB (ref 8.9–10.3)
CO2: 28 mmol/L (ref 22–32)
Chloride: 100 mmol/L (ref 98–111)
Creatinine, Ser: 0.61 mg/dL (ref 0.61–1.24)
GFR calc Af Amer: >60 mL/min (ref >60)
GFR calc non Af Amer: >60 mL/min (ref >60)
Glucose, Bld: 117 mg/dL — ABNORMAL HIGH (ref 70–99)
Potassium: 4 mmol/L (ref 3.5–5.1)
Sodium: 135 mmol/L (ref 135–145)

## 2018-09-21 MED ORDER — PALONOSETRON HCL INJECTION 0.25 MG/5ML
0.2500 mg | Freq: Once | INTRAVENOUS | Status: AC
  Administered 2018-09-22: 0.25 mg via INTRAVENOUS
  Filled 2018-09-21: qty 5

## 2018-09-21 MED ORDER — SODIUM CHLORIDE 0.9 % IV SOLN: Freq: Once | INTRAVENOUS | Status: DC

## 2018-09-21 MED ORDER — CYANOCOBALAMIN 1000 MCG/ML IJ SOLN
1000.0000 ug | Freq: Once | INTRAMUSCULAR | Status: AC
  Administered 2018-09-21: 1000 ug via INTRAMUSCULAR
  Filled 2018-09-21: qty 1

## 2018-09-21 MED ORDER — HEPARIN SOD (PORK) LOCK FLUSH 100 UNIT/ML IV SOLN: 500.0000 [IU] | Freq: Once | INTRAVENOUS | Status: DC | PRN

## 2018-09-21 MED ORDER — SODIUM CHLORIDE 0.9% FLUSH: 10.0000 mL | INTRAVENOUS | Status: DC | PRN

## 2018-09-21 MED ORDER — SODIUM CHLORIDE 0.9 % IV SOLN
10.0000 mg | Freq: Once | INTRAVENOUS | Status: AC
  Administered 2018-09-22: 10 mg via INTRAVENOUS
  Filled 2018-09-21: qty 1

## 2018-09-21 MED ORDER — HEPARIN SOD (PORK) LOCK FLUSH 100 UNIT/ML IV SOLN: 250.0000 [IU] | Freq: Once | INTRAVENOUS | Status: DC | PRN

## 2018-09-21 MED ORDER — SODIUM CHLORIDE 0.9% FLUSH: 3.0000 mL | INTRAVENOUS | Status: DC | PRN

## 2018-09-21 MED ORDER — SODIUM CHLORIDE 0.9 % IV SOLN
377.2000 mg | Freq: Once | INTRAVENOUS | Status: AC
  Administered 2018-09-22: 380 mg via INTRAVENOUS
  Filled 2018-09-21: qty 38

## 2018-09-21 MED ORDER — FOLIC ACID 1 MG PO TABS
1.0000 mg | ORAL_TABLET | Freq: Every day | ORAL | Status: DC
  Administered 2018-09-21 – 2018-09-25 (×5): 1 mg via ORAL
  Filled 2018-09-21 (×5): qty 1

## 2018-09-21 MED ORDER — ALTEPLASE 2 MG IJ SOLR
2.0000 mg | Freq: Once | INTRAMUSCULAR | Status: DC | PRN
  Filled 2018-09-21: qty 2

## 2018-09-21 MED ORDER — SODIUM CHLORIDE 0.9 % IV SOLN
515.0000 mg/m2 | Freq: Once | INTRAVENOUS | Status: AC
  Administered 2018-09-22: 1000 mg via INTRAVENOUS
  Filled 2018-09-21: qty 40

## 2018-09-21 NOTE — NC FL2 (Signed)
[place] LEVEL OF CARE SCREENING TOOL     IDENTIFICATION  Patient Name: Brian Ramirez Birthdate: 1945/11/12 Sex: male Admission Date (Current Location): 09/19/2018  [facility] and [place] Number:  Herbalist and Address:  [facility],  [address] [place], [place]      Provider Number: [id number]  Attending Physician Name and Address:  Damita Lack, MD  Relative Name and Phone Number:  Ermon Sagan, [phone]    Current Level of Care: Hospital Recommended Level of Care: [place] Prior Approval Number:    Date Approved/Denied:   PASRR Number: [id number]  Discharge Plan: SNF    Current Diagnoses: Patient Active Problem List   Diagnosis Date Noted  . Pressure injury of skin 09/20/2018  . Malnutrition of moderate degree 09/20/2018  . Lung cancer ([place]) 09/20/2018  . Goals of care, counseling/discussion 09/20/2018  . Dyspnea 09/19/2018  . Pleural effusion 09/04/2018  . Lung mass 09/04/2018  . Mass of right lung 09/04/2018  . Left inguinal hernia s/p lap repair 11/22/2016 09/26/2016  . Difficult airway for intubation 09/26/2016    Orientation RESPIRATION BLADDER Height & Weight     Self, Time, Situation, Place  O2(2.5L) Continent Weight: 159 lb 6.3 oz (72.3 kg) Height:  6\' 1"  (185.4 cm)  BEHAVIORAL SYMPTOMS/MOOD NEUROLOGICAL BOWEL NUTRITION STATUS      Continent Diet(regular)  AMBULATORY STATUS COMMUNICATION OF NEEDS Skin   Limited Assist Verbally Other (Comment)(has right Pleurx catheter )                       Personal Care Assistance Level of Assistance  Bathing, Feeding, Dressing Bathing Assistance: Limited assistance Feeding assistance: Independent Dressing Assistance: Limited assistance     Functional Limitations Info  Sight, Hearing, Speech Sight Info: Impaired(wears glassess) Hearing Info: Adequate Speech Info: Adequate    SPECIAL CARE FACTORS FREQUENCY  OT (By licensed  OT), PT (By licensed PT)     PT Frequency: 5x wk OT Frequency: 5x wk            Contractures Contractures Info: Not present    Additional Factors Info  Code Status, Allergies Code Status Info: Full Code Allergies Info: NKA           Current Medications (09/21/2018):  This is the current hospital active medication list Current Facility-Administered Medications  Medication Dose Route Frequency Provider Last Rate Last Dose  . acetaminophen (TYLENOL) tablet 650 mg  650 mg Oral Q6H PRN Damita Lack, MD   650 mg at 09/20/18 0938  . feeding supplement (ENSURE ENLIVE) (ENSURE ENLIVE) liquid 237 mL  237 mL Oral BID BM Amin, Ankit Chirag, MD   237 mL at 09/21/18 1003  . ipratropium-albuterol (DUONEB) 0.5-2.5 (3) MG/3ML nebulizer solution 3 mL  3 mL Nebulization TID Amin, Ankit Chirag, MD   3 mL at 09/21/18 0815  . multivitamin with minerals tablet 1 tablet  1 tablet Oral Daily Amin, Ankit Chirag, MD   1 tablet at 09/21/18 1003  . oxyCODONE (Oxy IR/ROXICODONE) immediate release tablet 5 mg  5 mg Oral Q4H PRN Amin, Jeanella Flattery, MD   5 mg at 09/21/18 0042  . polyethylene glycol (MIRALAX / GLYCOLAX) packet 17 g  17 g Oral Daily PRN Amin, Ankit Chirag, MD      . senna-docusate (Senokot-S) tablet 2 tablet  2 tablet Oral QHS PRN Damita Lack, MD         Discharge Medications:  Please see discharge summary for a list of discharge medications.  Relevant Imaging Results:  Relevant Lab Results:   Additional Information SS# [id number] pt has a Right Pleurx catheter   Wende Neighbors, LCSW
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
## 2018-09-21 NOTE — Progress Notes (Addendum)
09/21/18 Pleurex drain output: 300 serosanguinous. Pt felt slight SOB/wheezy post drainage. Increased Oxygen to 4L.  RT notified to eval/breathing [place].

## 2018-09-21 NOTE — NC FL2 (Signed)
[place] LEVEL OF CARE SCREENING TOOL     IDENTIFICATION  Patient Name: Brian Ramirez Birthdate: 03/05/1946 Sex: male Admission Date (Current Location): 09/19/2018  [facility] and [place] Number:  Herbalist and Address:  [facility],  [place], [place]      Provider Number: [id number]  Attending Physician Name and Address:  Damita Lack, MD  Relative Name and Phone Number:  Colton Tassin, [phone]    Current Level of Care: Hospital Recommended Level of Care: [place] Prior Approval Number:    Date Approved/Denied:   PASRR Number: [id number]  Discharge Plan: SNF    Current Diagnoses: Patient Active Problem List   Diagnosis Date Noted  . Pressure injury of skin 09/20/2018  . Malnutrition of moderate degree 09/20/2018  . Lung cancer ([place]) 09/20/2018  . Goals of care, counseling/discussion 09/20/2018  . Dyspnea 09/19/2018  . Pleural effusion 09/04/2018  . Lung mass 09/04/2018  . Mass of right lung 09/04/2018  . Left inguinal hernia s/p lap repair 11/22/2016 09/26/2016  . Difficult airway for intubation 09/26/2016    Orientation RESPIRATION BLADDER Height & Weight     Self, Time, Situation, Place  O2(2.5L) Continent Weight: 159 lb 6.3 oz (72.3 kg) Height:  6\' 1"  (185.4 cm)  BEHAVIORAL SYMPTOMS/MOOD NEUROLOGICAL BOWEL NUTRITION STATUS      Continent Diet(regular)  AMBULATORY STATUS COMMUNICATION OF NEEDS Skin   Limited Assist Verbally Other (Comment)(has right Pleurx catheter )                       Personal Care Assistance Level of Assistance  Bathing, Feeding, Dressing Bathing Assistance: Limited assistance Feeding assistance: Independent Dressing Assistance: Limited assistance     Functional Limitations Info  Sight, Hearing, Speech Sight Info: Impaired(wears glassess) Hearing Info: Adequate Speech Info: Adequate    SPECIAL CARE FACTORS FREQUENCY  OT (By licensed  OT), PT (By licensed PT)     PT Frequency: 5x wk OT Frequency: 5x wk            Contractures Contractures Info: Not present    Additional Factors Info  Code Status, Allergies Code Status Info: Full Code Allergies Info: NKA           Current Medications (09/21/2018):  This is the current hospital active medication list Current Facility-Administered Medications  Medication Dose Route Frequency Provider Last Rate Last Dose  . acetaminophen (TYLENOL) tablet 650 mg  650 mg Oral Q6H PRN Damita Lack, MD   650 mg at 09/20/18 8786  . feeding supplement (ENSURE ENLIVE) (ENSURE ENLIVE) liquid 237 mL  237 mL Oral BID BM Amin, Ankit Chirag, MD   237 mL at 76/72/09 4709  . folic acid (FOLVITE) tablet 1 mg  1 mg Oral Daily Ladell Pier, MD   1 mg at 09/21/18 1509  . ipratropium-albuterol (DUONEB) 0.5-2.5 (3) MG/3ML nebulizer solution 3 mL  3 mL Nebulization TID Amin, Ankit Chirag, MD   3 mL at 09/21/18 0815  . multivitamin with minerals tablet 1 tablet  1 tablet Oral Daily Amin, Ankit Chirag, MD   1 tablet at 09/21/18 1003  . oxyCODONE (Oxy IR/ROXICODONE) immediate release tablet 5 mg  5 mg Oral Q4H PRN Amin, Jeanella Flattery, MD   5 mg at 09/21/18 0042  . polyethylene glycol (MIRALAX / GLYCOLAX) packet 17 g  17 g Oral Daily PRN Amin, Jeanella Flattery, MD      . senna-docusate (  Senokot-S) tablet 2 tablet  2 tablet Oral QHS PRN Damita Lack, MD         Discharge Medications: Please see discharge summary for a list of discharge medications.  Relevant Imaging Results:  Relevant Lab Results:   Additional Information SS# [id number] pt has a Right Pleurx catheter/ will be getting chemo  Wende Neighbors, LCSW

## 2018-09-21 NOTE — Progress Notes (Signed)
PROGRESS NOTE    Brian Ramirez  JSH:[id number] DOB: 06-03-1946 DOA: 09/19/2018 PCP: [name], MD   Brief Narrative:  73 year old with history of lung cancer with recurrent large right-sided pleural effusion with recent Pleurx catheter placement presents to the hospital with hypoxic respiratory failure.  Patient was recently treated for malignant pleural effusion with antibiotics and Pleurx catheter was placed.  Even after going home he became progressively short of breath therefore return to Dr. Anne Hahn office outpatient and was sent to the hospital for admission.   Assessment & Plan:   Active Problems:   Pleural effusion   Mass of right lung   Dyspnea   Pressure injury of skin   Malnutrition of moderate degree  Acute respiratory distress with hypoxia secondary to right-sided large pleural effusion, slowly improving Malignant right-sided pleural effusion Stage IV lung cancer - Daily drainage from his Pleurx catheter.  Oncology team following.  Chemo/immunotherapy recommendations per oncology -Supplemental oxygen as needed -Supportive care, encourage p.o. intake  Anemia of chronic disease - Status post 2 units of PRBC transfusion.  Hemoglobin improved to 8.8  Generalized weakness and deconditioning Poor oral intake with mild dehydration - Physical therapy recommends skilled nursing facility -Continue IV fluids, poor intake orally.  I have encouraged him oral hydration  PT/OT evaluation performed.  SNF recommended.  SNF appropriate as the patient has received 3 days of hospital care (or the 3 days stay Has been made by the patient's insurance company) and is felt to need rehab services to restore this patient to their prior level of function to achieve safe transition back to home care.  This patient needs rehab services for at least 5 days/week and skilled nursing services daily to facilitate this transition.  Rehab is been requested as the most appropriate discharge option  for this patient and is not felt to be custodial care as evidenced by malignant right-sided pleural effusion causing generalized weakness which is worsened by poor oral intake.  No help at all at home.  Patient lives alone.  Mild to moderate protein calorie malnutrition -Encourage oral diet  Bilateral lower extremity swelling -Negative for DVT   DVT prophylaxis: SCDs Code Status: Full code Family Communication: None at bedside Disposition Plan: Maintain inpatient stay for at least next 24 hours to ensure his hydration status is improved  Consultants:   Oncology  Procedures:   None  Antimicrobials:   None   Subjective: About 350 cc drained from his Pleurx catheter.  Slight noticeable improvement.  Still poor oral intake.  Still reports of exertional shortness of breath with minimal ambulation.  Review of Systems Otherwise negative except as per HPI, including: General = no fevers, chills, dizziness, malaise, fatigue HEENT/EYES = negative for pain, redness, loss of vision, double vision, blurred vision, loss of hearing, sore throat, hoarseness, dysphagia Cardiovascular= negative for chest pain, palpitation, murmurs, lower extremity swelling Respiratory/lungs= negative for  cough, hemoptysis, wheezing, mucus production Gastrointestinal= negative for nausea, vomiting,, abdominal pain, melena, hematemesis Genitourinary= negative for Dysuria, Hematuria, Change in Urinary Frequency MSK = Negative for arthralgia, myalgias, Back Pain, Joint swelling  Neurology= Negative for headache, seizures, numbness, tingling  Psychiatry= Negative for anxiety, depression, suicidal and homocidal ideation Allergy/Immunology= Medication/Food allergy as listed  Skin= Negative for Rash, lesions, ulcers, itching   Objective: Vitals:   09/20/18 2110 09/20/18 2116 09/21/18 0527 09/21/18 0817  BP: 121/74  122/70   Pulse: 98  91   Resp: 17  16   Temp: 98.1 F (36.7 C)  98 F (36.7 C)   TempSrc:  Oral  Oral   SpO2: 94% 94% 95% 90%  Weight:      Height:        Intake/Output Summary (Last 24 hours) at 09/21/2018 1038 Last data filed at 09/21/2018 1000 Gross per 24 hour  Intake 240 ml  Output 600 ml  Net -360 ml   Filed Weights   09/19/18 1600  Weight: 72.3 kg    Examination: Constitutional: NAD, calm, comfortable Eyes: PERRL, lids and conjunctivae normal ENMT: Mucous membranes are moist. Posterior pharynx clear of any exudate or lesions.Normal dentition.  Neck: normal, supple, no masses, no thyromegaly Respiratory: Diminished breath sounds at the bases especially on the right side Cardiovascular: Regular rate and rhythm, no murmurs / rubs / gallops. No extremity edema. 2+ pedal pulses. No carotid bruits.  Abdomen: no tenderness, no masses palpated. No hepatosplenomegaly. Bowel sounds positive.  Musculoskeletal: no clubbing / cyanosis. No joint deformity upper and lower extremities. Good ROM, no contractures. Normal muscle tone.  Skin: no rashes, lesions, ulcers. No induration Neurologic: CN 2-12 grossly intact. Sensation intact, DTR normal. Strength 4/5 in all 4.  Psychiatric: Normal judgment and insight. Alert and oriented x 3. Normal mood.  Pleurx catheter noted    Data Reviewed:   CBC: Recent Labs  Lab 09/19/18 1458 09/20/18 0328 09/21/18 0336  WBC 13.1* 10.9* 13.9*  NEUTROABS 10.8*  --   --   HGB 7.8* 7.1* 8.8*  HCT 26.1* 23.8* 29.1*  MCV 87.9 87.8 87.7  PLT 524* 431* 672*   Basic Metabolic Panel: Recent Labs  Lab 09/19/18 1458 09/20/18 0328 09/21/18 0336  NA 135 136 135  K 4.0 3.8 4.0  CL 99 103 100  CO2 32 29 28  GLUCOSE 117* 101* 117*  BUN 13 12 12   CREATININE 0.66 0.55* 0.61  CALCIUM 7.8* 7.4* 7.5*  MG  --   --  2.0   GFR: Estimated Creatinine Clearance: 86.6 mL/min (by C-G formula based on SCr of 0.61 mg/dL). Liver Function Tests: Recent Labs  Lab 09/19/18 1458  AST 39  ALT 32  ALKPHOS 62  BILITOT 0.4  PROT 5.9*  ALBUMIN 1.6*    No results for input(s): LIPASE, AMYLASE in the last 168 hours. No results for input(s): AMMONIA in the last 168 hours. Coagulation Profile: No results for input(s): INR, PROTIME in the last 168 hours. Cardiac Enzymes: No results for input(s): CKTOTAL, CKMB, CKMBINDEX, TROPONINI in the last 168 hours. BNP (last 3 results) No results for input(s): PROBNP in the last 8760 hours. HbA1C: No results for input(s): HGBA1C in the last 72 hours. CBG: No results for input(s): GLUCAP in the last 168 hours. Lipid Profile: No results for input(s): CHOL, HDL, LDLCALC, TRIG, CHOLHDL, LDLDIRECT in the last 72 hours. Thyroid Function Tests: No results for input(s): TSH, T4TOTAL, FREET4, T3FREE, THYROIDAB in the last 72 hours. Anemia Panel: No results for input(s): VITAMINB12, FOLATE, FERRITIN, TIBC, IRON, RETICCTPCT in the last 72 hours. Sepsis Labs: No results for input(s): PROCALCITON, LATICACIDVEN in the last 168 hours.  No results found for this or any previous visit (from the past 240 hour(s)).       Radiology Studies: Dg Chest 2 View  Result Date: 09/19/2018 CLINICAL DATA:  Dyspnea. History of lung cancer with recurrent RIGHT pleural effusion, PleurX catheter placed 1 week ago. EXAM: CHEST - 2 VIEW COMPARISON:  Chest radiograph September 11, 2018 FINDINGS: Slightly decreased residual moderate to large RIGHT pleural effusion with pleural  vac catheter projecting in RIGHT lung base. Interstitial prominence with scattered nodular densities. RIGHT upper lobe density at site of known mass. Cardiac silhouette predominantly obscured. Calcified aortic arch. No pneumothorax. IMPRESSION: 1. Moderate to large residual RIGHT pleural effusion with pleural vac catheter in place. 2.  Aortic Atherosclerosis (ICD10-I70.0). Electronically Signed   By: Elon Alas M.D.   On: 09/19/2018 16:56   Vas [place] Lower Extremity Venous (dvt)  Result Date: 09/19/2018  Lower Venous Study Indications: Edema.  Performing  Technologist: June Leap RDMS, RVT  Examination Guidelines: A complete evaluation includes B-mode imaging, spectral Doppler, color Doppler, and power Doppler as needed of all accessible portions of each vessel. Bilateral testing is considered an integral part of a complete examination. Limited examinations for reoccurring indications may be performed as noted.  Right Venous Findings: +---------+---------------+---------+-----------+----------+-------+          CompressibilityPhasicitySpontaneityPropertiesSummary +---------+---------------+---------+-----------+----------+-------+ CFV      Full           Yes      Yes                          +---------+---------------+---------+-----------+----------+-------+ SFJ      Full                                                 +---------+---------------+---------+-----------+----------+-------+ FV Prox  Full                                                 +---------+---------------+---------+-----------+----------+-------+ FV Mid   Full                                                 +---------+---------------+---------+-----------+----------+-------+ FV DistalFull                                                 +---------+---------------+---------+-----------+----------+-------+ PFV      Full                                                 +---------+---------------+---------+-----------+----------+-------+ POP      Full           Yes      Yes                          +---------+---------------+---------+-----------+----------+-------+ PTV      Full                                                 +---------+---------------+---------+-----------+----------+-------+ PERO     Full                                                 +---------+---------------+---------+-----------+----------+-------+  Left Venous Findings: +---------+---------------+---------+-----------+----------+-------+           CompressibilityPhasicitySpontaneityPropertiesSummary +---------+---------------+---------+-----------+----------+-------+ CFV      Full           Yes      Yes                          +---------+---------------+---------+-----------+----------+-------+ SFJ      Full                                                 +---------+---------------+---------+-----------+----------+-------+ FV Prox  Full                                                 +---------+---------------+---------+-----------+----------+-------+ FV Mid   Full                                                 +---------+---------------+---------+-----------+----------+-------+ FV DistalFull                                                 +---------+---------------+---------+-----------+----------+-------+ PFV      Full                                                 +---------+---------------+---------+-----------+----------+-------+ POP      Full           Yes      Yes                          +---------+---------------+---------+-----------+----------+-------+ PTV      Full                                                 +---------+---------------+---------+-----------+----------+-------+ PERO     Full                                                 +---------+---------------+---------+-----------+----------+-------+    Summary: Right: There is no evidence of deep vein thrombosis in the lower extremity. No cystic structure found in the popliteal fossa. Left: There is no evidence of deep vein thrombosis in the lower extremity. No cystic structure found in the popliteal fossa.  *See table(s) above for measurements and observations. Electronically signed by Curt Jews MD on 09/19/2018 at 9:42:06 PM.    Final         Scheduled Meds: . feeding supplement (ENSURE ENLIVE)  237 mL Oral BID BM  . ipratropium-albuterol  3 mL Nebulization TID  . multivitamin with minerals  1 tablet Oral  Daily   Continuous Infusions:    LOS: 2 days   Time spent= 35 mins    Ankit Arsenio Loader, MD [facility]  If 7PM-7AM, please contact night-coverage [email or web address] 09/21/2018, 10:38 AM

## 2018-09-21 NOTE — Care Management Important Message (Signed)
Important Message  Patient Details  Name: Brian Ramirez MRN: [id number] Date of Birth: [birth year]   Medicare Important Message Given:  Yes    Kerin Salen 09/21/2018, 10:39 AMImportant Message  Patient Details  Name: Brian Ramirez MRN: [id number] Date of Birth: 10/23/1945   Medicare Important Message Given:  Yes    Kerin Salen 09/21/2018, 10:39 AM

## 2018-09-21 NOTE — Progress Notes (Signed)
IP PROGRESS NOTE  Subjective:   Mr. Furia reports improvement in dyspnea compared to hospital admission.  He continues to have exertional dyspnea.  He is waiting to discuss skilled nursing facility placement with care management.  Objective: Vital signs in last 24 hours: Blood pressure 118/71, pulse (!) 112, temperature 98 F (36.7 C), temperature source Axillary, resp. rate 20, height '6\' 1"'  (1.854 m), weight 159 lb 6.3 oz (72.3 kg), SpO2 95 %.  Intake/Output from previous day: 02/05 0701 - 02/06 0700 In: 857.5 [I.V.:857.5] Out: 1750 [Urine:800; Chest Tube:950]  Physical Exam:  HEENT: No thrush Lungs: Decreased breath sounds throughout the right chest. Cardiac: Regular rate and rhythm Abdomen: No hepatosplenomegaly Extremities: Pitting edema at the l left greater than right lower leg  Right Pleurx catheter site with a gauze dressing  Lab Results: Recent Labs    09/19/18 1458 09/20/18 0328  WBC 13.1* 10.9*  HGB 7.8* 7.1*  HCT 26.1* 23.8*  PLT 524* 431*   Hemoglobin 8.8 BMET Recent Labs    09/19/18 1458 09/20/18 0328  NA 135 136  K 4.0 3.8  CL 99 103  CO2 32 29  GLUCOSE 117* 101*  BUN 13 12  CREATININE 0.66 0.55*  CALCIUM 7.8* 7.4*    No results found for: CEA1  Studies/Results: Dg Chest 2 View  Result Date: 09/19/2018 CLINICAL DATA:  Dyspnea. History of lung cancer with recurrent RIGHT pleural effusion, PleurX catheter placed 1 week ago. EXAM: CHEST - 2 VIEW COMPARISON:  Chest radiograph September 11, 2018 FINDINGS: Slightly decreased residual moderate to large RIGHT pleural effusion with pleural vac catheter projecting in RIGHT lung base. Interstitial prominence with scattered nodular densities. RIGHT upper lobe density at site of known mass. Cardiac silhouette predominantly obscured. Calcified aortic arch. No pneumothorax. IMPRESSION: 1. Moderate to large residual RIGHT pleural effusion with pleural vac catheter in place. 2.  Aortic Atherosclerosis  (ICD10-I70.0). Electronically Signed   By: Elon Alas M.D.   On: 09/19/2018 16:56   Vas [place] Lower Extremity Venous (dvt)  Result Date: 09/19/2018  Lower Venous Study Indications: Edema.  Performing Technologist: June Leap RDMS, RVT  Examination Guidelines: A complete evaluation includes B-mode imaging, spectral Doppler, color Doppler, and power Doppler as needed of all accessible portions of each vessel. Bilateral testing is considered an integral part of a complete examination. Limited examinations for reoccurring indications may be performed as noted.  Right Venous Findings: +---------+---------------+---------+-----------+----------+-------+          CompressibilityPhasicitySpontaneityPropertiesSummary +---------+---------------+---------+-----------+----------+-------+ CFV      Full           Yes      Yes                          +---------+---------------+---------+-----------+----------+-------+ SFJ      Full                                                 +---------+---------------+---------+-----------+----------+-------+ FV Prox  Full                                                 +---------+---------------+---------+-----------+----------+-------+ FV Mid   Full                                                 +---------+---------------+---------+-----------+----------+-------+  FV DistalFull                                                 +---------+---------------+---------+-----------+----------+-------+ PFV      Full                                                 +---------+---------------+---------+-----------+----------+-------+ POP      Full           Yes      Yes                          +---------+---------------+---------+-----------+----------+-------+ PTV      Full                                                 +---------+---------------+---------+-----------+----------+-------+ PERO     Full                                                  +---------+---------------+---------+-----------+----------+-------+  Left Venous Findings: +---------+---------------+---------+-----------+----------+-------+          CompressibilityPhasicitySpontaneityPropertiesSummary +---------+---------------+---------+-----------+----------+-------+ CFV      Full           Yes      Yes                          +---------+---------------+---------+-----------+----------+-------+ SFJ      Full                                                 +---------+---------------+---------+-----------+----------+-------+ FV Prox  Full                                                 +---------+---------------+---------+-----------+----------+-------+ FV Mid   Full                                                 +---------+---------------+---------+-----------+----------+-------+ FV DistalFull                                                 +---------+---------------+---------+-----------+----------+-------+ PFV      Full                                                 +---------+---------------+---------+-----------+----------+-------+  POP      Full           Yes      Yes                          +---------+---------------+---------+-----------+----------+-------+ PTV      Full                                                 +---------+---------------+---------+-----------+----------+-------+ PERO     Full                                                 +---------+---------------+---------+-----------+----------+-------+    Summary: Right: There is no evidence of deep vein thrombosis in the lower extremity. No cystic structure found in the popliteal fossa. Left: There is no evidence of deep vein thrombosis in the lower extremity. No cystic structure found in the popliteal fossa.  *See table(s) above for measurements and observations. Electronically signed by Curt Jews MD on 09/19/2018 at 9:42:06  PM.    Final     Medications: I have reviewed the patient's current medications.  Assessment/Plan:  1.  Metastatic non-small cell lung cancer  large right pleural effusion, right lung mass, right pleural-based masses, left lung nodules  Right thoracentesis 09/04/2018-negative cytology  CT biopsy of right lateral pleural mass 09/07/2018-poorly differentiated non-small cell carcinoma, malignant cells positive for cytokeratin AE1/AE3 and cytokeratin 8/18, histology favoring adenocarcinoma, MSS, tumor mutation burden 5.no EGFR, ALK, BRAF alteration  PDL 1 tumor proportion score-100%  2.Severe anemia-likely secondary to bleeding in the right pleural space and metastatic carcinoma, red cell transfusions 09/09/2018 09/10/2018 3.Dyspnea secondary to the large right pleural effusion and anemia  Right Pleurx catheter placed 09/11/2018 4.History of tobacco use 5.History of kidney stones   Mr. Harshberger has noted partial improvement in dyspnea since hospital admission.  He continues to have dyspnea with minimal exertion.  He is unable to care for himself at home.  He plans to sue skilled nursing facility placement.  He has been diagnosed with metastatic non-small cell lung cancer.  I discussed treatment options with him.  We are unable to give pembrolizumab while he is an inpatient.  I recommend proceeding with a cycle of Alimta/carboplatin to be followed by outpatient pembrolizumab.  We reviewed potential toxicities associated with the Alimta/carboplatin regimen including the chance for nausea/vomiting, mucositis, alopecia, and allergic reaction, and hematologic toxicity.  We discussed the anti-folate effect of Alimta.  He will begin folic acid and vitamin B12 supplementation.    Recommendations: 1.  Drain Pleurx catheter daily 2.  Cycle 1 Alimta/carboplatin 09/22/2018 3.  Continue skin care for the sacral decubitus 4.  Skilled nursing facility placement 5.  Outpatient follow-up will  be scheduled [facility] for pembrolizumab and an office visit next week.  Oncology will continue following him daily.   LOS: 1 day   Betsy Coder, MD   09/20/2018, 3:32 PM

## 2018-09-21 NOTE — Clinical Social Work Note (Signed)
Clinical Social Work Assessment  Patient Details  Name: Brian Ramirez MRN: [id number] Date of Birth: 07/07/1946  Date of referral:  09/21/18               Reason for consult:  Facility Placement, Discharge Planning                Permission sought to share information with:  Family Supports Permission granted to share information::  Yes, Verbal Permission Granted  Name::     Mike Palmer  Agency::     Relationship::  brother  Contact Information:  [phone]  Housing/Transportation Living arrangements for the past 2 months:  Single Family Home Source of Information:  Patient Patient Interpreter Needed:  None Criminal Activity/Legal Involvement Pertinent to Current Situation/Hospitalization:  No - Comment as needed Significant Relationships:  Siblings Lives with:  Self Do you feel safe going back to the place where you live?  Yes Need for family participation in patient care:  Yes (Comment)  Care giving concerns:  No family at bedside. Patient stated he lives at home alone   Social Worker assessment / plan:  CSW met patient at bedside to offer support and discharge needs. Patient stated he is agreeable with discharge plan to SNF for rehab. CSW went over the process of SNF since patient has never been to a rehab facility. Patient stated his biggest concerns is finding a facility that will be able to transport him to his chemotherapy. CSW will follow up with patient once bed offers are made.  Employment status:  Retired Insurance information:  Medicare PT Recommendations:  Skilled Nursing Facility Information / Referral to community resources:  Skilled Nursing Facility  Patient/Family's Response to care:  Patient extremely pleasant and agreeable with plan  Patient/Family's Understanding of and Emotional Response to Diagnosis, Current Treatment, and Prognosis:  Patient wanting to go to rehab facility that will be able to transport patient to chemotherapy   Emotional  Assessment Appearance:  Appears stated age Attitude/Demeanor/Rapport:  Engaged Affect (typically observed):  Accepting Orientation:  Oriented to Self, Oriented to  Time, Oriented to Place, Oriented to Situation Alcohol / Substance use:  Not Applicable Psych involvement (Current and /or in the community):  No (Comment)  Discharge Needs  Concerns to be addressed:  Care Coordination Readmission within the last 30 days:  Yes Current discharge risk:  Dependent with Mobility Barriers to Discharge:  Continued Medical Work up   Ashley C Woods, LCSW 09/21/2018, 3:22 PM  

## 2018-09-22 DIAGNOSIS — R000 Tachycardia, unspecified: Secondary | ICD-10-CM

## 2018-09-22 LAB — BASIC METABOLIC PANEL
Anion gap: 6 (ref 5–15)
BUN: 11 mg/dL (ref 8–23)
CO2: 31 mmol/L (ref 22–32)
Calcium: 7.8 mg/dL — ABNORMAL LOW (ref 8.9–10.3)
Chloride: 97 mmol/L — ABNORMAL LOW (ref 98–111)
Creatinine, Ser: 0.7 mg/dL (ref 0.61–1.24)
GFR calc Af Amer: >60 mL/min (ref >60)
GFR calc non Af Amer: >60 mL/min (ref >60)
Glucose, Bld: 111 mg/dL — ABNORMAL HIGH (ref 70–99)
Potassium: 4.4 mmol/L (ref 3.5–5.1)
Sodium: 134 mmol/L — ABNORMAL LOW (ref 135–145)

## 2018-09-22 LAB — CBC
HCT: 30.5 % — ABNORMAL LOW (ref 39.0–52.0)
HEMOGLOBIN: 9.1 g/dL — AB (ref 13.0–17.0)
MCH: 26.8 pg (ref 26.0–34.0)
MCHC: 29.8 g/dL — AB (ref 30.0–36.0)
MCV: 90 fL (ref 80.0–100.0)
Platelets: 383 10*3/uL (ref 150–400)
RBC: 3.39 MIL/uL — ABNORMAL LOW (ref 4.22–5.81)
RDW: 15.6 % — ABNORMAL HIGH (ref 11.5–15.5)
WBC: 14 10*3/uL — ABNORMAL HIGH (ref 4.0–10.5)
nRBC: 0 % (ref 0.0–0.2)

## 2018-09-22 LAB — URINALYSIS, ROUTINE W REFLEX MICROSCOPIC
Bacteria, UA: NONE SEEN
GLUCOSE, UA: NEGATIVE mg/dL
Ketones, ur: 5 mg/dL — AB
Leukocytes, UA: NEGATIVE
Nitrite: NEGATIVE
Protein, ur: 30 mg/dL — AB
RBC / HPF: >50 RBC/hpf — ABNORMAL HIGH (ref 0–5)
Specific Gravity, Urine: 1.021 (ref 1.005–1.030)
pH: 6 (ref 5.0–8.0)

## 2018-09-22 LAB — MAGNESIUM: MAGNESIUM: 1.8 mg/dL (ref 1.7–2.4)

## 2018-09-22 MED ORDER — LEVOFLOXACIN 500 MG PO TABS
500.0000 mg | ORAL_TABLET | Freq: Every day | ORAL | Status: DC
  Administered 2018-09-22 – 2018-09-25 (×4): 500 mg via ORAL
  Filled 2018-09-22 (×4): qty 1

## 2018-09-22 MED ORDER — ACETAMINOPHEN 325 MG PO TABS
650.0000 mg | ORAL_TABLET | Freq: Once | ORAL | Status: AC
  Administered 2018-09-22: 650 mg via ORAL
  Filled 2018-09-22: qty 2

## 2018-09-22 MED ORDER — SODIUM CHLORIDE 0.9 % IV SOLN
INTRAVENOUS | Status: AC
  Administered 2018-09-23: 1000 mL via INTRAVENOUS

## 2018-09-22 MED ORDER — BUDESONIDE 0.5 MG/2ML IN SUSP
0.5000 mg | Freq: Two times a day (BID) | RESPIRATORY_TRACT | Status: DC
  Administered 2018-09-22 – 2018-09-25 (×7): 0.5 mg via RESPIRATORY_TRACT
  Filled 2018-09-22 (×6): qty 2

## 2018-09-22 NOTE — Progress Notes (Signed)
chemotherapy dosage verified with Clotilde Dieter, RN

## 2018-09-22 NOTE — Progress Notes (Signed)
Pleurex catheter drained per MD order.  450 cc of dark red fluid removed. Patient tolerated procedure well. Will continue to monitor.

## 2018-09-22 NOTE — Progress Notes (Addendum)
PROGRESS NOTE    Brian Ramirez  VVO:[id number] DOB: 07/05/1946 DOA: 09/19/2018 PCP: [name], MD   Brief Narrative:  73 year old with history of lung cancer with recurrent large right-sided pleural effusion with recent Pleurx catheter placement presents to the hospital with hypoxic respiratory failure.  Patient was recently treated for malignant pleural effusion with antibiotics and Pleurx catheter was placed.  Even after going home he became progressively short of breath therefore return to Dr. Anne Hahn office outpatient and was sent to the hospital for admission.   Assessment & Plan:   Active Problems:   Pleural effusion   Mass of right lung   Dyspnea   Pressure injury of skin   Malnutrition of moderate degree  Acute respiratory distress with hypoxia secondary to right-sided large pleural effusion, slowly improving Malignant right-sided pleural effusion Stage IV lung cancer -About the 100 cc drained from his Pleurx catheter yesterday.  Oncology following.  Will require daily drainage. -Chemotherapy treatment per oncology. -Supplemental oxygen as needed -Supportive care, encourage p.o. intake  Sinus tachycardia -We will get EKG.  Secondary to volume depletion and cardiac stress from underlying pulmonary pathology.  Anemia of chronic disease - Status post unit PRBC transfusion.  Hemoglobin stable at 9.1.  Generalized weakness and deconditioning Poor oral intake with mild dehydration - Physical therapy recommends skilled nursing facility -Continue IV fluids, poor intake orally.  I have encouraged him oral hydration  Mild to moderate protein calorie malnutrition -Encourage oral diet  Bilateral lower extremity swelling -Negative for DVT  ADDENDUM 3PM Fever 100.4. Will order Cultures, Resp panel, UA, IVF and Levaquin 500mg  po daily. Monitor fever curve.   DVT prophylaxis: SCDs Code Status: Full code Family Communication: None at bedside Disposition Plan: Maintain  inpatient stay until his breathing status has improved.  Consultants:   Oncology  Procedures:   None  Antimicrobials:   None   Subjective: About 300 cc drained from his Pleurx catheter.  During my evaluation even with minimal movement patient got extremely short of breath and tachycardic.  It took him several minutes to catch his breath.  No other new complaints.  Review of Systems Otherwise negative except as per HPI, including: General = no fevers, chills, dizziness, malaise, fatigue HEENT/EYES = negative for pain, redness, loss of vision, double vision, blurred vision, loss of hearing, sore throat, hoarseness, dysphagia Cardiovascular= negative for chest pain, palpitation, murmurs, lower extremity swelling Respiratory/lungs= negative for  hemoptysis, wheezing, mucus production Gastrointestinal= negative for nausea, vomiting,, abdominal pain, melena, hematemesis Genitourinary= negative for Dysuria, Hematuria, Change in Urinary Frequency MSK = Negative for arthralgia, myalgias, Back Pain, Joint swelling  Neurology= Negative for headache, seizures, numbness, tingling  Psychiatry= Negative for anxiety, depression, suicidal and homocidal ideation Allergy/Immunology= Medication/Food allergy as listed  Skin= Negative for Rash, lesions, ulcers, itching   Objective: Vitals:   09/21/18 1533 09/21/18 2118 09/21/18 2135 09/22/18 0611  BP:   133/76 132/83  Pulse:   (!) 107 (!) 105  Resp:   20 20  Temp:   99.4 F (37.4 C) 98.7 F (37.1 C)  TempSrc:   Oral Oral  SpO2: 92% 90% 96% 95%  Weight:      Height:        Intake/Output Summary (Last 24 hours) at 09/22/2018 1214 Last data filed at 09/22/2018 0930 Gross per 24 hour  Intake 1300 ml  Output 950 ml  Net 350 ml   Filed Weights   09/19/18 1600  Weight: 72.3 kg    Examination: Constitutional:  NAD, calm, comfortable Eyes: PERRL, lids and conjunctivae normal ENMT: Mucous membranes are moist. Posterior pharynx clear of any  exudate or lesions.Normal dentition.  Neck: normal, supple, no masses, no thyromegaly Respiratory: Diminished breath sounds at the bases especially on the right side Cardiovascular: Sinus tachycardia regular rate and rhythm, no murmurs / rubs / gallops. No extremity edema. 2+ pedal pulses. No carotid bruits.  Abdomen: no tenderness, no masses palpated. No hepatosplenomegaly. Bowel sounds positive.  Musculoskeletal: no clubbing / cyanosis. No joint deformity upper and lower extremities. Good ROM, no contractures. Normal muscle tone.  Skin: no rashes, lesions, ulcers. No induration Neurologic: CN 2-12 grossly intact. Sensation intact, DTR normal. Strength 4/5 in all 4.  Psychiatric: Normal judgment and insight. Alert and oriented x 3. Normal mood.  Right chest wall Pleurx catheter noted    Data Reviewed:   CBC: Recent Labs  Lab 09/19/18 1458 09/20/18 0328 09/21/18 0336 09/22/18 0512  WBC 13.1* 10.9* 13.9* 14.0*  NEUTROABS 10.8*  --   --   --   HGB 7.8* 7.1* 8.8* 9.1*  HCT 26.1* 23.8* 29.1* 30.5*  MCV 87.9 87.8 87.7 90.0  PLT 524* 431* 419* 408   Basic Metabolic Panel: Recent Labs  Lab 09/19/18 1458 09/20/18 0328 09/21/18 0336 09/22/18 0512  NA 135 136 135 134*  K 4.0 3.8 4.0 4.4  CL 99 103 100 97*  CO2 32 29 28 31   GLUCOSE 117* 101* 117* 111*  BUN 13 12 12 11   CREATININE 0.66 0.55* 0.61 0.70  CALCIUM 7.8* 7.4* 7.5* 7.8*  MG  --   --  2.0 1.8   GFR: Estimated Creatinine Clearance: 86.6 mL/min (by C-G formula based on SCr of 0.7 mg/dL). Liver Function Tests: Recent Labs  Lab 09/19/18 1458  AST 39  ALT 32  ALKPHOS 62  BILITOT 0.4  PROT 5.9*  ALBUMIN 1.6*   No results for input(s): LIPASE, AMYLASE in the last 168 hours. No results for input(s): AMMONIA in the last 168 hours. Coagulation Profile: No results for input(s): INR, PROTIME in the last 168 hours. Cardiac Enzymes: No results for input(s): CKTOTAL, CKMB, CKMBINDEX, TROPONINI in the last 168 hours. BNP  (last 3 results) No results for input(s): PROBNP in the last 8760 hours. HbA1C: No results for input(s): HGBA1C in the last 72 hours. CBG: No results for input(s): GLUCAP in the last 168 hours. Lipid Profile: No results for input(s): CHOL, HDL, LDLCALC, TRIG, CHOLHDL, LDLDIRECT in the last 72 hours. Thyroid Function Tests: No results for input(s): TSH, T4TOTAL, FREET4, T3FREE, THYROIDAB in the last 72 hours. Anemia Panel: No results for input(s): VITAMINB12, FOLATE, FERRITIN, TIBC, IRON, RETICCTPCT in the last 72 hours. Sepsis Labs: No results for input(s): PROCALCITON, LATICACIDVEN in the last 168 hours.  No results found for this or any previous visit (from the past 240 hour(s)).       Radiology Studies: No results found.      Scheduled Meds: . CARBOplatin  380 mg Intravenous Once  . feeding supplement (ENSURE ENLIVE)  237 mL Oral BID BM  . folic acid  1 mg Oral Daily  . ipratropium-albuterol  3 mL Nebulization TID  . multivitamin with minerals  1 tablet Oral Daily  . palonosetron  0.25 mg Intravenous Once  . PEMEtrexed (ALIMTA) CHEMO IV infusion  515 mg/m2 (Treatment Plan Recorded) Intravenous Once   Continuous Infusions: . sodium chloride    . dexamethasone (DECADRON) IVPB ([facility])       LOS: 3 days  Time spent= 35 mins    Ankit Arsenio Loader, MD [facility]  If 7PM-7AM, please contact night-coverage [email or web address] 09/22/2018, 12:14 PM

## 2018-09-22 NOTE — Progress Notes (Signed)
IP PROGRESS NOTE  Subjective:   Brian Ramirez continues to have exertional dyspnea.  He was out of bed with physical therapy yesterday.  He feels better compared to the day of admission.  No pain.  He had a nebulizer treatment this morning.  Objective: Vital signs in last 24 hours: Blood pressure 118/71, pulse (!) 112, temperature 98 F (36.7 C), temperature source Axillary, resp. rate 20, height 6' 1" (1.854 m), weight 159 lb 6.3 oz (72.3 kg), SpO2 95 %.  Intake/Output from previous day: 02/05 0701 - 02/06 0700 In: 857.5 [I.V.:857.5] Out: 1750 [Urine:800; Chest Tube:950]  Physical Exam:  HEENT: No thrush Lungs: Bilateral wheeze, decreased breath sounds throughout the right chest Cardiac: Tachycardia, irregular Abdomen: No hepatosplenomegaly Extremities: Pitting edema at the l left greater than right lower leg  Right Pleurx catheter site with a gauze dressing   Lab Results: Recent Labs    09/19/18 1458 09/20/18 0328  WBC 13.1* 10.9*  HGB 7.8* 7.1*  HCT 26.1* 23.8*  PLT 524* 431*   Hemoglobin 9.1 BMET Recent Labs    09/19/18 1458 09/20/18 0328  NA 135 136  K 4.0 3.8  CL 99 103  CO2 32 29  GLUCOSE 117* 101*  BUN 13 12  CREATININE 0.66 0.55*  CALCIUM 7.8* 7.4*  Creatinine 0.7  No results found for: CEA1  Studies/Results: Dg Chest 2 View  Result Date: 09/19/2018 CLINICAL DATA:  Dyspnea. History of lung cancer with recurrent RIGHT pleural effusion, PleurX catheter placed 1 week ago. EXAM: CHEST - 2 VIEW COMPARISON:  Chest radiograph September 11, 2018 FINDINGS: Slightly decreased residual moderate to large RIGHT pleural effusion with pleural vac catheter projecting in RIGHT lung base. Interstitial prominence with scattered nodular densities. RIGHT upper lobe density at site of known mass. Cardiac silhouette predominantly obscured. Calcified aortic arch. No pneumothorax. IMPRESSION: 1. Moderate to large residual RIGHT pleural effusion with pleural vac catheter in place.  2.  Aortic Atherosclerosis (ICD10-I70.0). Electronically Signed   By: Elon Alas M.D.   On: 09/19/2018 16:56   Vas [place] Lower Extremity Venous (dvt)  Result Date: 09/19/2018  Lower Venous Study Indications: Edema.  Performing Technologist: June Leap RDMS, RVT  Examination Guidelines: A complete evaluation includes B-mode imaging, spectral Doppler, color Doppler, and power Doppler as needed of all accessible portions of each vessel. Bilateral testing is considered an integral part of a complete examination. Limited examinations for reoccurring indications may be performed as noted.  Right Venous Findings: +---------+---------------+---------+-----------+----------+-------+          CompressibilityPhasicitySpontaneityPropertiesSummary +---------+---------------+---------+-----------+----------+-------+ CFV      Full           Yes      Yes                          +---------+---------------+---------+-----------+----------+-------+ SFJ      Full                                                 +---------+---------------+---------+-----------+----------+-------+ FV Prox  Full                                                 +---------+---------------+---------+-----------+----------+-------+ FV Mid  Full                                                 +---------+---------------+---------+-----------+----------+-------+ FV DistalFull                                                 +---------+---------------+---------+-----------+----------+-------+ PFV      Full                                                 +---------+---------------+---------+-----------+----------+-------+ POP      Full           Yes      Yes                          +---------+---------------+---------+-----------+----------+-------+ PTV      Full                                                 +---------+---------------+---------+-----------+----------+-------+ PERO     Full                                                  +---------+---------------+---------+-----------+----------+-------+  Left Venous Findings: +---------+---------------+---------+-----------+----------+-------+          CompressibilityPhasicitySpontaneityPropertiesSummary +---------+---------------+---------+-----------+----------+-------+ CFV      Full           Yes      Yes                          +---------+---------------+---------+-----------+----------+-------+ SFJ      Full                                                 +---------+---------------+---------+-----------+----------+-------+ FV Prox  Full                                                 +---------+---------------+---------+-----------+----------+-------+ FV Mid   Full                                                 +---------+---------------+---------+-----------+----------+-------+ FV DistalFull                                                 +---------+---------------+---------+-----------+----------+-------+ PFV  Full                                                 +---------+---------------+---------+-----------+----------+-------+ POP      Full           Yes      Yes                          +---------+---------------+---------+-----------+----------+-------+ PTV      Full                                                 +---------+---------------+---------+-----------+----------+-------+ PERO     Full                                                 +---------+---------------+---------+-----------+----------+-------+    Summary: Right: There is no evidence of deep vein thrombosis in the lower extremity. No cystic structure found in the popliteal fossa. Left: There is no evidence of deep vein thrombosis in the lower extremity. No cystic structure found in the popliteal fossa.  *See table(s) above for measurements and observations. Electronically signed by Curt Jews  MD on 09/19/2018 at 9:42:06 PM.    Final     Medications: I have reviewed the patient's current medications.  Assessment/Plan:  1.  Metastatic non-small cell lung cancer  large right pleural effusion, right lung mass, right pleural-based masses, left lung nodules  Right thoracentesis 09/04/2018-negative cytology  CT biopsy of right lateral pleural mass 09/07/2018-poorly differentiated non-small cell carcinoma, malignant cells positive for cytokeratin AE1/AE3 and cytokeratin 8/18, histology favoring adenocarcinoma, MSS, tumor mutation burden 5.no EGFR, ALK, BRAF alteration  PDL 1 tumor proportion score-100%  2.Severe anemia-likely secondary to bleeding in the right pleural space and metastatic carcinoma, red cell transfusions 09/09/2018 09/10/2018 3.Dyspnea secondary to the large right pleural effusion and anemia  Right Pleurx catheter placed 09/11/2018 4.History of tobacco use 5.History of kidney stones   Brian Ramirez appears stable.  He is scheduled to complete cycle 1 Alimta/carboplatin today.  We reviewed potential toxicities associated with this regimen.  He agrees to proceed.  I recommend continuing to drain the Pleurx catheter daily until the drainage is less than 100 cc.  The output was 300 cc yesterday.  He has tachycardia this morning.  This may be related to the nebulizer treatment.  I recommend an EKG for persistent tachycardia.  We will arrange for outpatient pembrolizumab.    Recommendations: 1.  Drain Pleurx catheter daily 2.  Cycle 1 Alimta/carboplatin 09/22/2018 3.  Continue skin care for the sacral decubitus 4.  Skilled nursing facility placement 5.  Outpatient follow-up will be scheduled at the Cancer center for pembrolizumab and an office visit next week. 6.  EKG for persistent tachycardia today  Oncology will continue following him daily.   LOS: 1 day   Betsy Coder, MD   09/20/2018, 3:32 PM

## 2018-09-23 DIAGNOSIS — R509 Fever, unspecified: Secondary | ICD-10-CM

## 2018-09-23 DIAGNOSIS — L8995 Pressure ulcer of unspecified site, unstageable: Secondary | ICD-10-CM

## 2018-09-23 DIAGNOSIS — J910 Malignant pleural effusion: Secondary | ICD-10-CM

## 2018-09-23 DIAGNOSIS — Z79899 Other long term (current) drug therapy: Secondary | ICD-10-CM

## 2018-09-23 LAB — RESPIRATORY PANEL BY PCR
Adenovirus: NOT DETECTED
Bordetella pertussis: NOT DETECTED
Chlamydophila pneumoniae: NOT DETECTED
Coronavirus 229E: NOT DETECTED
Coronavirus HKU1: NOT DETECTED
Coronavirus NL63: NOT DETECTED
Coronavirus OC43: NOT DETECTED
Influenza A: NOT DETECTED
Influenza B: NOT DETECTED
Metapneumovirus: NOT DETECTED
Mycoplasma pneumoniae: NOT DETECTED
PARAINFLUENZA VIRUS 1-RVPPCR: NOT DETECTED
Parainfluenza Virus 2: NOT DETECTED
Parainfluenza Virus 3: NOT DETECTED
Parainfluenza Virus 4: NOT DETECTED
Respiratory Syncytial Virus: NOT DETECTED
Rhinovirus / Enterovirus: NOT DETECTED

## 2018-09-23 LAB — GLUCOSE, CAPILLARY
Glucose-Capillary: 222 mg/dL — ABNORMAL HIGH (ref 70–99)
Glucose-Capillary: 164 mg/dL — ABNORMAL HIGH (ref 70–99)
Glucose-Capillary: 136 mg/dL — ABNORMAL HIGH (ref 70–99)

## 2018-09-23 LAB — MAGNESIUM: Magnesium: 2 mg/dL (ref 1.7–2.4)

## 2018-09-23 LAB — PREPARE RBC (CROSSMATCH)

## 2018-09-23 LAB — BASIC METABOLIC PANEL
Anion gap: 6 (ref 5–15)
BUN: 17 mg/dL (ref 8–23)
CO2: 30 mmol/L (ref 22–32)
Calcium: 7.9 mg/dL — ABNORMAL LOW (ref 8.9–10.3)
Chloride: 98 mmol/L (ref 98–111)
Creatinine, Ser: 0.49 mg/dL — ABNORMAL LOW (ref 0.61–1.24)
GFR calc Af Amer: >60 mL/min (ref >60)
GFR calc non Af Amer: >60 mL/min (ref >60)
Glucose, Bld: 164 mg/dL — ABNORMAL HIGH (ref 70–99)
POTASSIUM: 4.6 mmol/L (ref 3.5–5.1)
Sodium: 134 mmol/L — ABNORMAL LOW (ref 135–145)

## 2018-09-23 LAB — CBC
HCT: 28 % — ABNORMAL LOW (ref 39.0–52.0)
Hemoglobin: 8 g/dL — ABNORMAL LOW (ref 13.0–17.0)
MCH: 25.8 pg — ABNORMAL LOW (ref 26.0–34.0)
MCHC: 28.6 g/dL — ABNORMAL LOW (ref 30.0–36.0)
MCV: 90.3 fL (ref 80.0–100.0)
Platelets: 411 10*3/uL — ABNORMAL HIGH (ref 150–400)
RBC: 3.1 MIL/uL — AB (ref 4.22–5.81)
RDW: 15.7 % — ABNORMAL HIGH (ref 11.5–15.5)
WBC: 12.2 10*3/uL — ABNORMAL HIGH (ref 4.0–10.5)
nRBC: 0 % (ref 0.0–0.2)

## 2018-09-23 MED ORDER — FUROSEMIDE 10 MG/ML IJ SOLN
INTRAMUSCULAR | Status: AC
  Filled 2018-09-23: qty 2

## 2018-09-23 MED ORDER — FUROSEMIDE 10 MG/ML IJ SOLN
20.0000 mg | Freq: Once | INTRAMUSCULAR | Status: AC
  Administered 2018-09-23: 20 mg via INTRAVENOUS

## 2018-09-23 MED ORDER — SODIUM CHLORIDE 0.9% IV SOLUTION
Freq: Once | INTRAVENOUS | Status: AC
  Administered 2018-09-23: 15:00:00 via INTRAVENOUS

## 2018-09-23 MED ORDER — FUROSEMIDE 10 MG/ML IJ SOLN
20.0000 mg | Freq: Once | INTRAMUSCULAR | Status: AC
  Administered 2018-09-23: 20 mg via INTRAVENOUS
  Filled 2018-09-23: qty 2

## 2018-09-23 NOTE — Progress Notes (Signed)
Pleurex drained per MD order. 1.3L of dark red fluid removed. Patient tolerated procedure well. Will continue to monitor.

## 2018-09-23 NOTE — Progress Notes (Signed)
Brian Ramirez is doing okay right now.  He had his chemotherapy yesterday.  He seemed to have tolerated this pretty well.  He had the Pleurx catheter open on the right side.  He had 0.50 cc of fluid removed.  His hemoglobin is only 8.  I really think that he will need a transfusion of 2 units red blood cells.  Given his chemotherapy, I think his red cell count probably will decrease.  He has had no obvious fever.  He has had some wheezing.  He is on nebulizers.  He is out of bed a little bit.  His appetite is quite good.  Looks like he ate all of his breakfast this morning.  His labs show a white cell count 12.2.  Hemoglobin 8 and platelet count 4 11,000.  His sodium is 134 potassium 4.6 BUN 17 creatinine 0.49.  On his physical exam, his temperature is 98.2.  Pulse 93.  Blood pressure 114/98.  Head neck exam shows no ocular or oral lesions.  His mouth shows no mucositis.  His lungs show decreased breath sounds over on the right side.  He has some wheezes bilaterally.  Cardiac exam regular rate and rhythm with occasional extra beat.  Abdomen is soft.  He has no fluid wave.  There is no palpable liver or spleen tip.  Extremities shows no clubbing, cyanosis or edema.  Neurological exam is nonfocal.  Mr. Albro has a non-small cell lung cancer.  He has malignant pleural effusion on the right.  He had his first cycle of chemotherapy (without the immunotherapy) yesterday.  He will get the immunotherapy as an outpatient.  The immunotherapy is can be critical as he has a tumor that has a very high PD-L1 level.  We will go ahead with his transfusion today.  I think this will help.  I know that he is getting fantastic care from all the staff up on [address].  He is very complementary of the care that he is getting.  Lattie Haw, MD  Michaelyn Barter 2:10

## 2018-09-23 NOTE — Progress Notes (Signed)
PROGRESS NOTE    Brian Ramirez  WCB:[id number] DOB: 23-Oct-1945 DOA: 09/19/2018 PCP: [name], MD   Brief Narrative:  73 year old with history of lung cancer with recurrent large right-sided pleural effusion with recent Pleurx catheter placement presents to the hospital with hypoxic respiratory failure.  Patient was recently treated for malignant pleural effusion with antibiotics and Pleurx catheter was placed.  Even after going home he became progressively short of breath therefore return to Dr. Anne Hahn office outpatient and was sent to the hospital for admission.  Started on chemotherapy in the hospital on 2/8.  Developed fever therefore started on Levaquin and cultures drawn.  Received transfusion in the hospital.   Assessment & Plan:   Active Problems:   Pleural effusion   Mass of right lung   Dyspnea   Pressure injury of skin   Malnutrition of moderate degree  Acute respiratory distress with hypoxia secondary to right-sided large pleural effusion, slowly improving Malignant right-sided pleural effusion Stage IV lung cancer -About the 400 cc drained from his Pleurx catheter yesterday.  Oncology following.  Will require daily drainage. -Started chemotherapy 2/8.  Appreciate oncology input -Supplemental oxygen as needed -Supportive care, encourage p.o. intake  Fever of unknown origin -Unsure of the source chemo related versus possible underlying infection.  Cultures drawn, UA negative.  Started on p.o. Levaquin.  Will check procalcitonin level tomorrow.  Follow-up respiratory panel.  Sinus tachycardia -Improved.  Closely monitor.  Anemia of chronic disease -Status post 1 unit PRBC transfusion.  Hemoglobin stable.  Expected to drop his hemoglobin further after chemo therefore hematology ordered 2 more units PRBC today.    Generalized weakness and deconditioning Poor oral intake with mild dehydration - Physical therapy recommends skilled nursing facility -Continue IV  fluids, poor intake orally.  I have encouraged him oral hydration  Mild to moderate protein calorie malnutrition -Encourage oral diet  Bilateral lower extremity swelling -Negative for DVT  DVT prophylaxis: SCDs Code Status: Full code Family Communication: None at bedside Disposition Plan: Maintain inpatient stay until cleared by oncology and his fevers have improved.  Consultants:   Oncology  Procedures:   None  Antimicrobials:   None   Subjective: Patient spiked a fever yesterday after getting his chemotherapy but empirically was started on antibiotics.  400 cc removed from his Pleurx catheter.  Felt little better afterwards but this morning again feels little short of breath especially with minimal exertion.  Review of Systems Otherwise negative except as per HPI, including: General = no fevers, chills, dizziness, malaise, fatigue HEENT/EYES = negative for pain, redness, loss of vision, double vision, blurred vision, loss of hearing, sore throat, hoarseness, dysphagia Cardiovascular= negative for chest pain, palpitation, murmurs, lower extremity swelling Respiratory/lungs= negative for  hemoptysis, wheezing, mucus production Gastrointestinal= negative for nausea, vomiting,, abdominal pain, melena, hematemesis Genitourinary= negative for Dysuria, Hematuria, Change in Urinary Frequency MSK = Negative for arthralgia, myalgias, Back Pain, Joint swelling  Neurology= Negative for headache, seizures, numbness, tingling  Psychiatry= Negative for anxiety, depression, suicidal and homocidal ideation Allergy/Immunology= Medication/Food allergy as listed  Skin= Negative for Rash, lesions, ulcers, itching   Objective: Vitals:   09/22/18 1625 09/22/18 2044 09/22/18 2139 09/23/18 0609  BP:   111/67 (!) 114/98  Pulse:   96 93  Resp:   20 20  Temp:   98 F (36.7 C) 98.2 F (36.8 C)  TempSrc:   Oral Oral  SpO2: 93% 91% 94% 93%  Weight:      Height:  Intake/Output  Summary (Last 24 hours) at 09/23/2018 1049 Last data filed at 09/23/2018 5852 Gross per 24 hour  Intake 719 ml  Output 825 ml  Net -106 ml   Filed Weights   09/19/18 1600  Weight: 72.3 kg    Examination: Constitutional: NAD, calm, comfortable Eyes: PERRL, lids and conjunctivae normal ENMT: Mucous membranes are moist. Posterior pharynx clear of any exudate or lesions.Normal dentition.  Neck: normal, supple, no masses, no thyromegaly Respiratory: Diminished breath sounds especially on the right side Cardiovascular: Regular rate and rhythm, no murmurs / rubs / gallops. No extremity edema. 2+ pedal pulses. No carotid bruits.  Abdomen: no tenderness, no masses palpated. No hepatosplenomegaly. Bowel sounds positive.  Musculoskeletal: no clubbing / cyanosis. No joint deformity upper and lower extremities. Good ROM, no contractures. Normal muscle tone.  Skin: no rashes, lesions, ulcers. No induration Neurologic: CN 2-12 grossly intact. Sensation intact, DTR normal. Strength 5/5 in all 4.  Psychiatric: Normal judgment and insight. Alert and oriented x 3. Normal mood.  Right chest wall Pleurx catheter noted.   Data Reviewed:   CBC: Recent Labs  Lab 09/19/18 1458 09/20/18 0328 09/21/18 0336 09/22/18 0512 09/23/18 0512  WBC 13.1* 10.9* 13.9* 14.0* 12.2*  NEUTROABS 10.8*  --   --   --   --   HGB 7.8* 7.1* 8.8* 9.1* 8.0*  HCT 26.1* 23.8* 29.1* 30.5* 28.0*  MCV 87.9 87.8 87.7 90.0 90.3  PLT 524* 431* 419* 383 778*   Basic Metabolic Panel: Recent Labs  Lab 09/19/18 1458 09/20/18 0328 09/21/18 0336 09/22/18 0512 09/23/18 0512  NA 135 136 135 134* 134*  K 4.0 3.8 4.0 4.4 4.6  CL 99 103 100 97* 98  CO2 32 29 28 31 30   GLUCOSE 117* 101* 117* 111* 164*  BUN 13 12 12 11 17   CREATININE 0.66 0.55* 0.61 0.70 0.49*  CALCIUM 7.8* 7.4* 7.5* 7.8* 7.9*  MG  --   --  2.0 1.8 2.0   GFR: Estimated Creatinine Clearance: 86.6 mL/min (A) (by C-G formula based on SCr of 0.49 mg/dL (L)). Liver  Function Tests: Recent Labs  Lab 09/19/18 1458  AST 39  ALT 32  ALKPHOS 62  BILITOT 0.4  PROT 5.9*  ALBUMIN 1.6*   No results for input(s): LIPASE, AMYLASE in the last 168 hours. No results for input(s): AMMONIA in the last 168 hours. Coagulation Profile: No results for input(s): INR, PROTIME in the last 168 hours. Cardiac Enzymes: No results for input(s): CKTOTAL, CKMB, CKMBINDEX, TROPONINI in the last 168 hours. BNP (last 3 results) No results for input(s): PROBNP in the last 8760 hours. HbA1C: No results for input(s): HGBA1C in the last 72 hours. CBG: Recent Labs  Lab 09/23/18 0823  GLUCAP 222*   Lipid Profile: No results for input(s): CHOL, HDL, LDLCALC, TRIG, CHOLHDL, LDLDIRECT in the last 72 hours. Thyroid Function Tests: No results for input(s): TSH, T4TOTAL, FREET4, T3FREE, THYROIDAB in the last 72 hours. Anemia Panel: No results for input(s): VITAMINB12, FOLATE, FERRITIN, TIBC, IRON, RETICCTPCT in the last 72 hours. Sepsis Labs: No results for input(s): PROCALCITON, LATICACIDVEN in the last 168 hours.  No results found for this or any previous visit (from the past 240 hour(s)).       Radiology Studies: No results found.      Scheduled Meds: . sodium chloride   Intravenous Once  . budesonide (PULMICORT) nebulizer solution  0.5 mg Nebulization BID  . feeding supplement (ENSURE ENLIVE)  237 mL Oral BID  BM  . folic acid  1 mg Oral Daily  . furosemide  20 mg Intravenous Once  . furosemide  20 mg Intravenous Once  . ipratropium-albuterol  3 mL Nebulization TID  . levofloxacin  500 mg Oral Daily  . multivitamin with minerals  1 tablet Oral Daily   Continuous Infusions: . sodium chloride    . sodium chloride 75 mL/hr at 09/22/18 1731     LOS: 4 days   Time spent= 35 mins    Diandra Cimini Arsenio Loader, MD [facility]  If 7PM-7AM, please contact night-coverage [email or web address] 09/23/2018, 10:49 AM

## 2018-09-24 ENCOUNTER — Encounter: Payer: Self-pay | Admitting: *Deleted

## 2018-09-24 DIAGNOSIS — D649 Anemia, unspecified: Secondary | ICD-10-CM

## 2018-09-24 LAB — BASIC METABOLIC PANEL
Anion gap: 8 (ref 5–15)
BUN: 21 mg/dL (ref 8–23)
CO2: 30 mmol/L (ref 22–32)
Calcium: 8 mg/dL — ABNORMAL LOW (ref 8.9–10.3)
Chloride: 97 mmol/L — ABNORMAL LOW (ref 98–111)
Creatinine, Ser: 0.58 mg/dL — ABNORMAL LOW (ref 0.61–1.24)
GFR calc Af Amer: >60 mL/min (ref >60)
GFR calc non Af Amer: >60 mL/min (ref >60)
Glucose, Bld: 100 mg/dL — ABNORMAL HIGH (ref 70–99)
Potassium: 4.2 mmol/L (ref 3.5–5.1)
Sodium: 135 mmol/L (ref 135–145)

## 2018-09-24 LAB — CBC
HCT: 33.6 % — ABNORMAL LOW (ref 39.0–52.0)
Hemoglobin: 10.2 g/dL — ABNORMAL LOW (ref 13.0–17.0)
MCH: 27.4 pg (ref 26.0–34.0)
MCHC: 30.4 g/dL (ref 30.0–36.0)
MCV: 90.3 fL (ref 80.0–100.0)
Platelets: 337 10*3/uL (ref 150–400)
RBC: 3.72 MIL/uL — AB (ref 4.22–5.81)
RDW: 15.9 % — ABNORMAL HIGH (ref 11.5–15.5)
WBC: 15.1 10*3/uL — ABNORMAL HIGH (ref 4.0–10.5)
nRBC: 0 % (ref 0.0–0.2)

## 2018-09-24 LAB — TYPE AND SCREEN
ABO/RH(D): O POS
Antibody Screen: NEGATIVE
Unit division: 0
Unit division: 0

## 2018-09-24 LAB — BPAM RBC
Blood Product Expiration Date: 202003062359
Blood Product Expiration Date: 202003062359
ISSUE DATE / TIME: 202002091800
ISSUE DATE / TIME: 202002091442
UNIT TYPE AND RH: 5100
Unit Type and Rh: 5100

## 2018-09-24 LAB — MAGNESIUM: MAGNESIUM: 1.8 mg/dL (ref 1.7–2.4)

## 2018-09-24 LAB — PROCALCITONIN: PROCALCITONIN: 0.12 ng/mL

## 2018-09-24 NOTE — Progress Notes (Signed)
PROGRESS NOTE    Brian Ramirez  OHY:[id number] DOB: 1946/08/12 DOA: 09/19/2018 PCP: [name], MD   Brief Narrative:  73 year old with history of lung cancer with recurrent large right-sided pleural effusion with recent Pleurx catheter placement presents to the hospital with hypoxic respiratory failure.  Patient was recently treated for malignant pleural effusion with antibiotics and Pleurx catheter was placed.  Even after going home he became progressively short of breath therefore return to Dr. Anne Hahn office outpatient and was sent to the hospital for admission.  Started on chemotherapy in the hospital on 2/8.  Developed fever therefore started on Levaquin and cultures drawn.  Received transfusion in the hospital.   Assessment & Plan:   Active Problems:   Pleural effusion   Mass of right lung   Dyspnea   Pressure injury of skin   Malnutrition of moderate degree  Acute respiratory distress with hypoxia secondary to right-sided large pleural effusion, slowly improving Malignant right-sided pleural effusion Stage IV lung cancer -Daily Pleurx catheter drainage, drained about 1 L yesterday. -Chemo started per oncology on 2/8. -Supplemental oxygen as needed -Supportive care, encourage p.o. intake  Fever of unknown origin -Respiratory panel is negative, UA is negative.  Empirically started Levaquin, will complete short course of this.  Sinus tachycardia -Improved.  Closely monitor.  Anemia of chronic disease -Received PRBC transfusion in the hospital.  Hemoglobin stable at 10.2 now.  Status post 2 units PRBC  Generalized weakness and deconditioning Poor oral intake with mild dehydration - Physical therapy recommends skilled nursing facility, will need placement -Continue IV fluids, poor intake orally.  I have encouraged him oral hydration  Mild to moderate protein calorie malnutrition -Encourage oral diet  Bilateral lower extremity swelling -Negative for DVT  DVT  prophylaxis: SCDs Code Status: Full code Family Communication: None at bedside Disposition Plan: Maintain inpatient stay until cleared by oncology and patient has placement.  Consultants:   Oncology  Procedures:   None  Antimicrobials:   Levaquin day 3   Subjective: Continues to have exertional shortness of breath with minimal activity.  He is upset that no physical therapy worked with him yesterday.  Review of Systems Otherwise negative except as per HPI, including: General = no fevers, chills, dizziness, malaise, fatigue HEENT/EYES = negative for pain, redness, loss of vision, double vision, blurred vision, loss of hearing, sore throat, hoarseness, dysphagia Cardiovascular= negative for chest pain, palpitation, murmurs, lower extremity swelling Respiratory/lungs= negative for shortness of breath, cough, hemoptysis, wheezing, mucus production Gastrointestinal= negative for nausea, vomiting,, abdominal pain, melena, hematemesis Genitourinary= negative for Dysuria, Hematuria, Change in Urinary Frequency MSK = Negative for arthralgia, myalgias, Back Pain, Joint swelling  Neurology= Negative for headache, seizures, numbness, tingling  Psychiatry= Negative for anxiety, depression, suicidal and homocidal ideation Allergy/Immunology= Medication/Food allergy as listed  Skin= Negative for Rash, lesions, ulcers, itching    Objective: Vitals:   09/23/18 2225 09/24/18 0529 09/24/18 0542 09/24/18 0844  BP:  (!) 146/70    Pulse:  (!) 112 (!) 104   Resp:  16    Temp:  99.1 F (37.3 C)    TempSrc:  Oral    SpO2: 94% 94%  93%  Weight:      Height:        Intake/Output Summary (Last 24 hours) at 09/24/2018 1105 Last data filed at 09/24/2018 1050 Gross per 24 hour  Intake 1344.16 ml  Output 3115 ml  Net -1770.84 ml   Filed Weights   09/19/18 1600  Weight: 72.3  kg    Examination: Constitutional: NAD, calm, comfortable Eyes: PERRL, lids and conjunctivae normal ENMT: Mucous  membranes are moist. Posterior pharynx clear of any exudate or lesions.Normal dentition.  Neck: normal, supple, no masses, no thyromegaly Respiratory: Diminished breath sounds on the right side Cardiovascular: Regular rate and rhythm, no murmurs / rubs / gallops. No extremity edema. 2+ pedal pulses. No carotid bruits.  Abdomen: no tenderness, no masses palpated. No hepatosplenomegaly. Bowel sounds positive.  Musculoskeletal: no clubbing / cyanosis. No joint deformity upper and lower extremities. Good ROM, no contractures. Normal muscle tone.  Skin: no rashes, lesions, ulcers. No induration Neurologic: CN 2-12 grossly intact. Sensation intact, DTR normal. Strength 4/5 in all 4.  Psychiatric: Normal judgment and insight. Alert and oriented x 3. Normal mood.  Right chest wall Pleurx catheter noted   Data Reviewed:   CBC: Recent Labs  Lab 09/19/18 1458 09/20/18 0328 09/21/18 0336 09/22/18 0512 09/23/18 0512 09/24/18 0521  WBC 13.1* 10.9* 13.9* 14.0* 12.2* 15.1*  NEUTROABS 10.8*  --   --   --   --   --   HGB 7.8* 7.1* 8.8* 9.1* 8.0* 10.2*  HCT 26.1* 23.8* 29.1* 30.5* 28.0* 33.6*  MCV 87.9 87.8 87.7 90.0 90.3 90.3  PLT 524* 431* 419* 383 411* 010   Basic Metabolic Panel: Recent Labs  Lab 09/20/18 0328 09/21/18 0336 09/22/18 0512 09/23/18 0512 09/24/18 0521  NA 136 135 134* 134* 135  K 3.8 4.0 4.4 4.6 4.2  CL 103 100 97* 98 97*  CO2 29 28 31 30 30   GLUCOSE 101* 117* 111* 164* 100*  BUN 12 12 11 17 21   CREATININE 0.55* 0.61 0.70 0.49* 0.58*  CALCIUM 7.4* 7.5* 7.8* 7.9* 8.0*  MG  --  2.0 1.8 2.0 1.8   GFR: Estimated Creatinine Clearance: 86.6 mL/min (A) (by C-G formula based on SCr of 0.58 mg/dL (L)). Liver Function Tests: Recent Labs  Lab 09/19/18 1458  AST 39  ALT 32  ALKPHOS 62  BILITOT 0.4  PROT 5.9*  ALBUMIN 1.6*   No results for input(s): LIPASE, AMYLASE in the last 168 hours. No results for input(s): AMMONIA in the last 168 hours. Coagulation Profile: No  results for input(s): INR, PROTIME in the last 168 hours. Cardiac Enzymes: No results for input(s): CKTOTAL, CKMB, CKMBINDEX, TROPONINI in the last 168 hours. BNP (last 3 results) No results for input(s): PROBNP in the last 8760 hours. HbA1C: No results for input(s): HGBA1C in the last 72 hours. CBG: Recent Labs  Lab 09/23/18 0823 09/23/18 1145 09/23/18 1635  GLUCAP 222* 164* 136*   Lipid Profile: No results for input(s): CHOL, HDL, LDLCALC, TRIG, CHOLHDL, LDLDIRECT in the last 72 hours. Thyroid Function Tests: No results for input(s): TSH, T4TOTAL, FREET4, T3FREE, THYROIDAB in the last 72 hours. Anemia Panel: No results for input(s): VITAMINB12, FOLATE, FERRITIN, TIBC, IRON, RETICCTPCT in the last 72 hours. Sepsis Labs: Recent Labs  Lab 09/24/18 0521  PROCALCITON 0.12    Recent Results (from the past 240 hour(s))  Culture, blood (Routine X 2) w Reflex to ID Panel     Status: None (Preliminary result)   Collection Time: 09/22/18  3:53 PM  Result Value Ref Range Status   Specimen Description   Final    BLOOD BLOOD RIGHT HAND Performed at [facility], [place] [address] [place], [place] [address]    Special Requests   Final    BOTTLES DRAWN AEROBIC ONLY Blood Culture adequate volume Performed at [facility]  [place] [address], [place], [place] [address]    Culture   Final    NO GROWTH < 24 HOURS Performed at [place] [address], [place], [place] [address]    Report Status PENDING  Incomplete  Culture, blood (Routine X 2) w Reflex to ID Panel     Status: None (Preliminary result)   Collection Time: 09/22/18  3:56 PM  Result Value Ref Range Status   Specimen Description   Final    BLOOD BLOOD LEFT HAND Performed at [place] [address] [place], [place] [address]    Special Requests   Final    BOTTLES DRAWN AEROBIC ONLY Blood Culture adequate volume Performed at [place] [address] [place], [place] [address]    Culture   Final    NO GROWTH < 24 HOURS Performed at [place] [address], [place], [place] [address]    Report Status PENDING  Incomplete  Respiratory Panel by PCR     Status: None   Collection Time: 09/22/18  5:33 PM  Result Value Ref Range Status   Adenovirus NOT DETECTED NOT DETECTED Final   Coronavirus 229E NOT DETECTED NOT DETECTED Final    Comment: (NOTE) The Coronavirus on the Respiratory Panel, DOES NOT test for the novel  Coronavirus (2019 nCoV)    Coronavirus HKU1 NOT DETECTED NOT DETECTED Final   Coronavirus NL63 NOT DETECTED NOT DETECTED Final   Coronavirus OC43 NOT DETECTED NOT DETECTED Final   Metapneumovirus NOT DETECTED NOT DETECTED Final   Rhinovirus / Enterovirus NOT DETECTED NOT DETECTED Final   Influenza A NOT DETECTED NOT DETECTED Final   Influenza B NOT DETECTED NOT DETECTED Final   Parainfluenza Virus 1 NOT DETECTED NOT DETECTED Final   Parainfluenza Virus 2 NOT DETECTED NOT DETECTED Final   Parainfluenza Virus 3 NOT DETECTED NOT DETECTED Final   Parainfluenza Virus 4 NOT DETECTED NOT DETECTED Final   Respiratory Syncytial Virus NOT DETECTED NOT DETECTED Final   Bordetella pertussis NOT DETECTED NOT DETECTED Final   Chlamydophila pneumoniae NOT DETECTED NOT DETECTED Final   Mycoplasma pneumoniae NOT DETECTED NOT DETECTED Final    Comment: Performed at [facility], [place]. [address], [place], [place] [address]         Radiology Studies: No results found.      Scheduled Meds: . budesonide (PULMICORT) nebulizer solution  0.5 mg Nebulization BID  . feeding supplement (ENSURE ENLIVE)  237 mL Oral BID BM  . folic acid  1 mg Oral Daily  . ipratropium-albuterol  3 mL Nebulization TID  . levofloxacin  500 mg Oral Daily  . multivitamin with minerals  1 tablet Oral Daily   Continuous Infusions: . sodium chloride       LOS: 5 days   Time spent= 25 mins    Elliana Bal Arsenio Loader, MD [facility]  If 7PM-7AM, please contact night-coverage [email or web address] 09/24/2018, 11:05 AM

## 2018-09-24 NOTE — Progress Notes (Signed)
Oncology Nurse Navigator Documentation  Oncology Nurse Navigator Flowsheets 09/24/2018  Navigator Location [facility]  Navigator Encounter Type Other/I spoke with Brian Ramirez in the hospital today.  He is doing well after his first treatment, no side effects noted.  I gave and explained information on lung cancer dx and treatment regimin.  He will be going to SNF after discharge and in-patient hospital staff is working on placement.  Most of his concerns were about placement.  I listened as he explained.  He has information on facilities and will make his choice known to [place]  Patient Visit Type Inpatient  Treatment Phase Treatment  Barriers/Navigation Needs Education  Education Understanding Cancer/ Treatment Options;Newly Diagnosed Cancer Education;Other  Interventions Education  Education Method Verbal;Written  Acuity Level 1  Time Spent with Patient 30

## 2018-09-24 NOTE — Progress Notes (Signed)
Pt has selected [facility] from bed offers. Planning to admit upon expected availability tomorrow.   Sharren Bridge, MSW, LCSW Clinical Social Work 09/24/2018 [phone]

## 2018-09-24 NOTE — Care Management Important Message (Signed)
Important Message  Patient Details  Name: Brian Ramirez MRN: [id number] Date of Birth: 1946-03-31   Medicare Important Message Given:  Yes    Kerin Salen 09/24/2018, 10:36 AMImportant Message  Patient Details  Name: Brian Ramirez MRN: [id number] Date of Birth: 02-23-1946   Medicare Important Message Given:  Yes    Kerin Salen 09/24/2018, 10:36 AM

## 2018-09-24 NOTE — Progress Notes (Signed)
IP PROGRESS NOTE  Subjective:   Brian Ramirez completed cycle 1 chemotherapy 09/22/2018.  He tolerated the chemotherapy well.  He continues to have exertional dyspnea.  He had a fever on 09/22/2018.  He is anxious to proceed with skilled nursing facility placement.  Objective: Vital signs in last 24 hours: Blood pressure 118/71, pulse (!) 112, temperature 98 F (36.7 C), temperature source Axillary, resp. rate 20, height _0  (1.854 m), weight 159 lb 6.3 oz (72.3 kg), SpO2 95 %.  Intake/Output from previous day: 02/05 0701 - 02/06 0700 In: 857.5 [I.V.:857.5] Out: 1750 [Urine:800; Chest Tube:950]  Physical Exam:  HEENT: No thrush Lungs: Decreased breath sounds throughout the right chest, no respiratory distress Cardiac: Regular rhythm, tachycardia Abdomen: No hepatosplenomegaly Extremities: Pitting edema at the l left greater than right lower leg  Right Pleurx catheter site with a gauze dressing   Lab Results: Recent Labs    09/19/18 1458 09/20/18 0328  WBC 13.1* 10.9*  HGB 7.8* 7.1*  HCT 26.1* 23.8*  PLT 524* 431*  Hemoglobin 10.2  BMET Recent Labs    09/19/18 1458 09/20/18 0328  NA 135 136  K 4.0 3.8  CL 99 103  CO2 32 29  GLUCOSE 117* 101*  BUN 13 12  CREATININE 0.66 0.55*  CALCIUM 7.8* 7.4*  Creatinine 0.6    Studies/Results: Dg Chest 2 View  Result Date: 09/19/2018 CLINICAL DATA:  Dyspnea. History of lung cancer with recurrent RIGHT pleural effusion, PleurX catheter placed 1 week ago. EXAM: CHEST - 2 VIEW COMPARISON:  Chest radiograph September 11, 2018 FINDINGS: Slightly decreased residual moderate to large RIGHT pleural effusion with pleural vac catheter projecting in RIGHT lung base. Interstitial prominence with scattered nodular densities. RIGHT upper lobe density at site of known mass. Cardiac silhouette predominantly obscured. Calcified aortic arch. No pneumothorax. IMPRESSION: 1. Moderate to large residual RIGHT pleural effusion with pleural vac catheter  in place. 2.  Aortic Atherosclerosis (ICD10-I70.0). Electronically Signed   By: Elon Alas M.D.   On: 09/19/2018 16:56   Vas [place] Lower Extremity Venous (dvt)  Result Date: 09/19/2018  Lower Venous Study Indications: Edema.  Performing Technologist: June Leap RDMS, RVT  Examination Guidelines: A complete evaluation includes B-mode imaging, spectral Doppler, color Doppler, and power Doppler as needed of all accessible portions of each vessel. Bilateral testing is considered an integral part of a complete examination. Limited examinations for reoccurring indications may be performed as noted.  Right Venous Findings: +---------+---------------+---------+-----------+----------+-------+          CompressibilityPhasicitySpontaneityPropertiesSummary +---------+---------------+---------+-----------+----------+-------+ CFV      Full           Yes      Yes                          +---------+---------------+---------+-----------+----------+-------+ SFJ      Full                                                 +---------+---------------+---------+-----------+----------+-------+ FV Prox  Full                                                 +---------+---------------+---------+-----------+----------+-------+ FV Mid   Full                                                 +---------+---------------+---------+-----------+----------+-------+  FV DistalFull                                                 +---------+---------------+---------+-----------+----------+-------+ PFV      Full                                                 +---------+---------------+---------+-----------+----------+-------+ POP      Full           Yes      Yes                          +---------+---------------+---------+-----------+----------+-------+ PTV      Full                                                 +---------+---------------+---------+-----------+----------+-------+ PERO      Full                                                 +---------+---------------+---------+-----------+----------+-------+  Left Venous Findings: +---------+---------------+---------+-----------+----------+-------+          CompressibilityPhasicitySpontaneityPropertiesSummary +---------+---------------+---------+-----------+----------+-------+ CFV      Full           Yes      Yes                          +---------+---------------+---------+-----------+----------+-------+ SFJ      Full                                                 +---------+---------------+---------+-----------+----------+-------+ FV Prox  Full                                                 +---------+---------------+---------+-----------+----------+-------+ FV Mid   Full                                                 +---------+---------------+---------+-----------+----------+-------+ FV DistalFull                                                 +---------+---------------+---------+-----------+----------+-------+ PFV      Full                                                 +---------+---------------+---------+-----------+----------+-------+  POP      Full           Yes      Yes                          +---------+---------------+---------+-----------+----------+-------+ PTV      Full                                                 +---------+---------------+---------+-----------+----------+-------+ PERO     Full                                                 +---------+---------------+---------+-----------+----------+-------+    Summary: Right: There is no evidence of deep vein thrombosis in the lower extremity. No cystic structure found in the popliteal fossa. Left: There is no evidence of deep vein thrombosis in the lower extremity. No cystic structure found in the popliteal fossa.  *See table(s) above for measurements and observations. Electronically signed by  Curt Jews MD on 09/19/2018 at 9:42:06 PM.    Final     Medications: I have reviewed the patient's current medications.  Assessment/Plan:  1.  Metastatic non-small cell lung cancer  large right pleural effusion, right lung mass, right pleural-based masses, left lung nodules  Right thoracentesis 09/04/2018-negative cytology  CT biopsy of right lateral pleural mass 09/07/2018-poorly differentiated non-small cell carcinoma, malignant cells positive for cytokeratin AE1/AE3 and cytokeratin 8/18, histology favoring adenocarcinoma, MSS, tumor mutation burden 5.no EGFR, ALK, BRAF alteration  PDL 1 tumor proportion score-100%   Cycle 1 Alimta/carboplatin 09/22/2018  2.Severe anemia-likely secondary to bleeding in the right pleural space and metastatic carcinoma, red cell transfusions 09/09/2018 09/10/2018, 09/23/2018 3.Dyspnea secondary to the large right pleural effusion and anemia  Right Pleurx catheter placed 09/11/2018 4.History of tobacco use 5.History of kidney stones 6.  Fever-most likely tumor fever   Brian Ramirez completed the first cycle of chemotherapy without significant acute toxicity.  He appears unchanged.  The right pleural catheter continues to drain a significant amount of fluid.  He is waiting on skilled nursing facility placement.  We will arrange for outpatient pembrolizumab when he is out of the hospital.   Recommendations: 1.  Drain Pleurx catheter daily 2.  No need for daily labs 3.  Continue skin care for the sacral decubitus 4.  Skilled nursing facility placement 5.  Outpatient follow-up at the Cancer center  Oncology will continue following him daily.   LOS: 1 day   Betsy Coder, MD   09/20/2018, 3:32 PM

## 2018-09-24 NOTE — Progress Notes (Signed)
Pleurex drained per MD order.  350 cc of dark red fluid removed, patient tolerated procedure well. Will continue to monitor.
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
## 2018-09-25 ENCOUNTER — Encounter: Payer: Self-pay | Admitting: *Deleted

## 2018-09-25 DIAGNOSIS — I5033 Acute on chronic diastolic (congestive) heart failure: Secondary | ICD-10-CM | POA: Diagnosis present

## 2018-09-25 DIAGNOSIS — R2689 Other abnormalities of gait and mobility: Secondary | ICD-10-CM | POA: Diagnosis not present

## 2018-09-25 DIAGNOSIS — J910 Malignant pleural effusion: Secondary | ICD-10-CM | POA: Diagnosis not present

## 2018-09-25 DIAGNOSIS — E46 Unspecified protein-calorie malnutrition: Secondary | ICD-10-CM | POA: Diagnosis not present

## 2018-09-25 DIAGNOSIS — C799 Secondary malignant neoplasm of unspecified site: Secondary | ICD-10-CM | POA: Diagnosis not present

## 2018-09-25 DIAGNOSIS — Z7901 Long term (current) use of anticoagulants: Secondary | ICD-10-CM | POA: Diagnosis not present

## 2018-09-25 DIAGNOSIS — R278 Other lack of coordination: Secondary | ICD-10-CM | POA: Diagnosis not present

## 2018-09-25 DIAGNOSIS — Z5112 Encounter for antineoplastic immunotherapy: Secondary | ICD-10-CM | POA: Diagnosis not present

## 2018-09-25 DIAGNOSIS — R509 Fever, unspecified: Secondary | ICD-10-CM | POA: Diagnosis present

## 2018-09-25 DIAGNOSIS — I5031 Acute diastolic (congestive) heart failure: Secondary | ICD-10-CM | POA: Diagnosis not present

## 2018-09-25 DIAGNOSIS — Z9221 Personal history of antineoplastic chemotherapy: Secondary | ICD-10-CM | POA: Diagnosis not present

## 2018-09-25 DIAGNOSIS — D649 Anemia, unspecified: Secondary | ICD-10-CM | POA: Diagnosis not present

## 2018-09-25 DIAGNOSIS — J90 Pleural effusion, not elsewhere classified: Secondary | ICD-10-CM | POA: Diagnosis not present

## 2018-09-25 DIAGNOSIS — C7989 Secondary malignant neoplasm of other specified sites: Secondary | ICD-10-CM | POA: Diagnosis not present

## 2018-09-25 DIAGNOSIS — R531 Weakness: Secondary | ICD-10-CM | POA: Diagnosis not present

## 2018-09-25 DIAGNOSIS — R0602 Shortness of breath: Secondary | ICD-10-CM | POA: Diagnosis not present

## 2018-09-25 DIAGNOSIS — C3490 Malignant neoplasm of unspecified part of unspecified bronchus or lung: Secondary | ICD-10-CM | POA: Diagnosis not present

## 2018-09-25 DIAGNOSIS — Z87891 Personal history of nicotine dependence: Secondary | ICD-10-CM | POA: Diagnosis not present

## 2018-09-25 DIAGNOSIS — Z87442 Personal history of urinary calculi: Secondary | ICD-10-CM | POA: Diagnosis not present

## 2018-09-25 DIAGNOSIS — M6281 Muscle weakness (generalized): Secondary | ICD-10-CM | POA: Diagnosis not present

## 2018-09-25 DIAGNOSIS — D62 Acute posthemorrhagic anemia: Secondary | ICD-10-CM | POA: Diagnosis present

## 2018-09-25 DIAGNOSIS — R600 Localized edema: Secondary | ICD-10-CM | POA: Diagnosis not present

## 2018-09-25 DIAGNOSIS — D638 Anemia in other chronic diseases classified elsewhere: Secondary | ICD-10-CM | POA: Diagnosis not present

## 2018-09-25 DIAGNOSIS — E876 Hypokalemia: Secondary | ICD-10-CM | POA: Diagnosis present

## 2018-09-25 DIAGNOSIS — J9691 Respiratory failure, unspecified with hypoxia: Secondary | ICD-10-CM | POA: Diagnosis not present

## 2018-09-25 DIAGNOSIS — R070 Pain in throat: Secondary | ICD-10-CM | POA: Diagnosis not present

## 2018-09-25 DIAGNOSIS — T451X5A Adverse effect of antineoplastic and immunosuppressive drugs, initial encounter: Secondary | ICD-10-CM | POA: Diagnosis present

## 2018-09-25 DIAGNOSIS — I509 Heart failure, unspecified: Secondary | ICD-10-CM | POA: Diagnosis not present

## 2018-09-25 DIAGNOSIS — Z9981 Dependence on supplemental oxygen: Secondary | ICD-10-CM | POA: Diagnosis not present

## 2018-09-25 DIAGNOSIS — M2550 Pain in unspecified joint: Secondary | ICD-10-CM | POA: Diagnosis not present

## 2018-09-25 DIAGNOSIS — Z7401 Bed confinement status: Secondary | ICD-10-CM | POA: Diagnosis not present

## 2018-09-25 DIAGNOSIS — D6481 Anemia due to antineoplastic chemotherapy: Secondary | ICD-10-CM | POA: Diagnosis present

## 2018-09-25 DIAGNOSIS — E440 Moderate protein-calorie malnutrition: Secondary | ICD-10-CM | POA: Diagnosis not present

## 2018-09-25 DIAGNOSIS — I4891 Unspecified atrial fibrillation: Secondary | ICD-10-CM | POA: Diagnosis not present

## 2018-09-25 DIAGNOSIS — R5081 Fever presenting with conditions classified elsewhere: Secondary | ICD-10-CM | POA: Diagnosis not present

## 2018-09-25 DIAGNOSIS — R0603 Acute respiratory distress: Secondary | ICD-10-CM | POA: Diagnosis not present

## 2018-09-25 DIAGNOSIS — R918 Other nonspecific abnormal finding of lung field: Secondary | ICD-10-CM | POA: Diagnosis not present

## 2018-09-25 DIAGNOSIS — Z79899 Other long term (current) drug therapy: Secondary | ICD-10-CM | POA: Diagnosis not present

## 2018-09-25 DIAGNOSIS — C3491 Malignant neoplasm of unspecified part of right bronchus or lung: Secondary | ICD-10-CM | POA: Diagnosis not present

## 2018-09-25 DIAGNOSIS — I4892 Unspecified atrial flutter: Secondary | ICD-10-CM | POA: Diagnosis not present

## 2018-09-25 DIAGNOSIS — J9601 Acute respiratory failure with hypoxia: Secondary | ICD-10-CM | POA: Diagnosis not present

## 2018-09-25 DIAGNOSIS — Z809 Family history of malignant neoplasm, unspecified: Secondary | ICD-10-CM | POA: Diagnosis not present

## 2018-09-25 MED ORDER — POLYETHYLENE GLYCOL 3350 17 G PO PACK: 17.0000 g | PACK | Freq: Every day | ORAL | 0 refills | Status: DC | PRN

## 2018-09-25 MED ORDER — IPRATROPIUM-ALBUTEROL 0.5-2.5 (3) MG/3ML IN SOLN: 3.0000 mL | Freq: Two times a day (BID) | RESPIRATORY_TRACT | Status: DC

## 2018-09-25 MED ORDER — FOLIC ACID 1 MG PO TABS: 1.0000 mg | ORAL_TABLET | Freq: Every day | ORAL | 0 refills | Status: AC

## 2018-09-25 MED ORDER — LEVOFLOXACIN 500 MG PO TABS: 500.0000 mg | ORAL_TABLET | Freq: Every day | ORAL | 0 refills | Status: AC

## 2018-09-25 NOTE — Discharge Summary (Signed)
Physician Discharge Summary  Brian Ramirez KGU:[id number] DOB: 1945-09-22 DOA: 09/19/2018  PCP: [name], MD  Admit date: 09/19/2018 Discharge date: 09/25/2018  Admitted From: Home Disposition: SNF  Recommendations for Outpatient Follow-up:  1. Follow up with PCP in 1-2 weeks 2. Please obtain BMP/CBC in one week your next doctors visit.  3. Daily Pleurx catheter drainage until less than 100 cc daily.  Avoid draining more than 1 L over 24 hours. 4. Follow-up outpatient with Dr. Learta Codding for chemotherapy. 5. Oral Levaquin for 2 more days  Discharge Condition: Stable CODE STATUS: Full Diet recommendation: Regular  Brief/Interim Summary: 73 year old with history of lung cancer with recurrent large right-sided pleural effusion with recent Pleurx catheter placement presents to the hospital with hypoxic respiratory failure.  Patient was recently treated for malignant pleural effusion with antibiotics and Pleurx catheter was placed.  Even after going home he became progressively short of breath therefore return to Dr. Odis Hollingshead office outpatient and was sent to the hospital for admission.  Started on chemotherapy in the hospital on 2/8.  Developed fever therefore started on Levaquin and cultures drawn.  Cultures remain negative.  Will remain on 2 more days of oral Levaquin.  His Pleurx catheter was daily drained.  He also received 2 units of PRBC transfusion during the hospitalization and his hemoglobin remained stable. He was started on chemotherapy in the hospital per oncology with outpatient follow-up with more chemotherapy on 2/13. Patient has reached max of benefit from hospital stay and stable for discharge today.   Discharge Diagnoses:  Active Problems:   Pleural effusion   Mass of right lung   Dyspnea   Pressure injury of skin   Malnutrition of moderate degree  Acute respiratory distress with hypoxia secondary to right-sided large pleural effusion, slowly improving Malignant  right-sided pleural effusion Stage IV lung cancer -Patient symptoms are very off-and-on.  Still gets exertional shortness of breath but now appears to be chronic.  Recommend daily drainage from his Pleurx catheter until less than 100 cc over 24 hours.  Avoid draining more than 1 L over 24 hours. -Continue therapy per oncology.  Needs to follow-up outpatient with them on 2/13. -Supplemental oxygen as needed -Supportive care, encourage p.o. intake  Fever of unknown origin -Improved.  Work-up negative.  Respiratory panel is negative, UA is negative.  Empirically started on Levaquin, will need 2 more days.  Sinus tachycardia -Improved.  Closely monitor.  Anemia of chronic disease -Received PRBC transfusion in the hospital.  Hemoglobin stable at 10.2 now.  Status post 2 units PRBC  Generalized weakness and deconditioning Poor oral intake with mild dehydration - Encourage oral diet.  Would benefit from skilled nursing facility  Mild to moderate protein calorie malnutrition -Encourage oral diet  Bilateral lower extremity swelling -Negative for DVT  DVT prophylaxis: SCDs Code Status: Full code Family Communication: None at bedside Disposition Plan:  Discharge to skilled nursing facility  Discharge Instructions   Allergies as of 09/25/2018   No Known Allergies     Medication List    TAKE these medications   folic acid 1 MG tablet Commonly known as:  FOLVITE Take 1 tablet (1 mg total) by mouth daily for 30 days.   levofloxacin 500 MG tablet Commonly known as:  LEVAQUIN Take 1 tablet (500 mg total) by mouth daily for 2 days.   multivitamin with minerals Tabs tablet Take 1 tablet by mouth daily.   polyethylene glycol packet Commonly known as:  MIRALAX / GLYCOLAX Take 17  g by mouth daily as needed for moderate constipation or severe constipation (use first).      Follow-up Information    Koirala, Dibas, MD. Schedule an appointment as soon as possible for a visit in  1 week(s).   Specialty:  Family Medicine Contact information: [address] [place] [address] [phone]        Ladell Pier, MD. Go on 09/27/2018.   Specialty:  Oncology Contact information: [place] [address] [phone]          No Known Allergies  You were cared for by a hospitalist during your hospital stay. If you have any questions about your discharge medications or the care you received while you were in the hospital after you are discharged, you can call the unit and asked to speak with the hospitalist on call if the hospitalist that took care of you is not available. Once you are discharged, your primary care physician will handle any further medical issues. Please note that no refills for any discharge medications will be authorized once you are discharged, as it is imperative that you return to your primary care physician (or establish a relationship with a primary care physician if you do not have one) for your aftercare needs so that they can reassess your need for medications and monitor your lab values.  Consultations:  Oncology   Procedures/Studies: Dg Chest 1 View  Result Date: 09/04/2018 CLINICAL DATA:  Status post thoracentesis EXAM: CHEST  1 VIEW COMPARISON:  Chest radiograph 09/04/2017 FINDINGS: Decreased size of right pleural effusion, now occupying approximately 1/3 the volume of the right hemithorax previously 2/3. No pneumothorax. Nodular opacity in the left mid lung corresponds to findings on recent chest CT. Cardiomediastinal size is normal. IMPRESSION: Decreased size of right pleural effusion. No pneumothorax. Electronically Signed   By: Ulyses Jarred M.D.   On: 09/04/2018 16:46   Dg Chest 2 View  Result Date: 09/19/2018 CLINICAL DATA:  Dyspnea. History of lung cancer with recurrent RIGHT pleural effusion, PleurX catheter placed 1 week ago. EXAM: CHEST - 2 VIEW COMPARISON:  Chest radiograph  September 11, 2018 FINDINGS: Slightly decreased residual moderate to large RIGHT pleural effusion with pleural vac catheter projecting in RIGHT lung base. Interstitial prominence with scattered nodular densities. RIGHT upper lobe density at site of known mass. Cardiac silhouette predominantly obscured. Calcified aortic arch. No pneumothorax. IMPRESSION: 1. Moderate to large residual RIGHT pleural effusion with pleural vac catheter in place. 2.  Aortic Atherosclerosis (ICD10-I70.0). Electronically Signed   By: Elon Alas M.D.   On: 09/19/2018 16:56   Dg Chest 2 View  Result Date: 09/06/2018 CLINICAL DATA:  Shortness of breath.  Pleural effusion. EXAM: CHEST - 2 VIEW COMPARISON:  September 04, 2018 FINDINGS: The large right pleural effusion with underlying opacity may be mildly worsened in the interval. Left perihilar mild infiltrate suspected. No other change. IMPRESSION: 1. The moderate to large right pleural effusion appears larger in the interval. 2. Left perihilar opacity may represent developing infiltrate. Electronically Signed   By: Dorise Bullion III M.D   On: 09/06/2018 10:29   Ct Angio Chest Pe W And/or Wo Contrast  Result Date: 09/04/2018 CLINICAL DATA:  Pleural effusion and pulmonary nodules (suspicious for neoplasm) on chest radiograph. Shortness of breath. Evaluate for PE. EXAM: CT ANGIOGRAPHY CHEST WITH CONTRAST TECHNIQUE: Multidetector CT imaging of the chest was performed using the standard protocol during bolus administration of intravenous contrast. Multiplanar CT image  reconstructions and MIPs were obtained to evaluate the vascular anatomy. CONTRAST:  25mL ISOVUE-370 IOPAMIDOL (ISOVUE-370) INJECTION 76% COMPARISON:  Chest radiograph dated 09/04/2018 FINDINGS: Cardiovascular: Satisfactory opacification of the bilateral pulmonary arteries to the lobar level. Segmental/segmental pulmonary arteries are limited due to motion degradation. No evidence of pulmonary embolism. No evidence of  thoracic aortic aneurysm or dissection. Atherosclerotic calcifications of the aortic arch. Mild coronary atherosclerosis of the LAD. The heart is normal in size.  No pericardial effusion. Mediastinum/Nodes: 12 mm right hilar node (series 4/image 49). 9 mm subcarinal node (series 4/image 52). Visualized thyroid is unremarkable. Lungs/Pleura: Evaluation lung parenchyma is constrained by respiratory motion. 2.7 x 4.6 cm mass in the anterior right upper lung (series 4/image 29), suspicious for primary bronchogenic neoplasm. Additional 1.8 x 2.6 cm nodule centrally in the right upper lobe (series 4/image 47), suspicious. Large right pleural effusion with pleural-based nodularity/metastases (for example, series 4/image 104). Associated compressive atelectasis of the right middle and lower lobes. Multiple pulmonary nodules in the left upper and lower lobes, lower lobe predominant, poorly evaluated due to motion degradation. Index nodule measures 14 mm in the central left lower lobe (series 10/image 28). These are compatible with metastases. Additional mild patchy opacity in the lingula (series 10/image 98). No pneumothorax. Upper Abdomen: Visualized upper abdomen is motion degraded but grossly unremarkable, noting aberrant perirenal vasculature along the right upper kidney (series 4/image 127). Musculoskeletal: Degenerative changes of the visualized thoracolumbar spine. Review of the MIP images confirms the above findings. IMPRESSION: No evidence of pulmonary embolism. 4.6 cm mass in the anterior right upper lung, suspicious for primary bronchogenic neoplasm versus pleural-based metastasis. Additional 2.6 cm nodule centrally in the right upper lobe, suspicious for primary bronchogenic neoplasm versus metastasis (related to the anterior mass). Large right pleural effusion with pleural-based metastases. Multiple pulmonary metastases in the left hemithorax. Possible small subcarinal and right hilar nodal metastases,  indeterminate. Aortic Atherosclerosis (ICD10-I70.0). Electronically Signed   By: Julian Hy M.D.   On: 09/04/2018 14:53   Ct Aspiration  Result Date: 09/08/2018 CLINICAL DATA:  Large right bloody pleural effusion and multiple pleural masses. Negative cytology on prior fluid withdrawn by thoracentesis on 09/04/2018. Significant reaccumulation pleural fluid since that time by chest x-ray. Request for biopsy of pleural mass for tissue diagnosis. EXAM: 1. CT-GUIDED RIGHT THORACENTESIS 2. CT GUIDED CORE BIOPSY OF RIGHT PLEURAL MASS ANESTHESIA/SEDATION: 2.0 mg IV Versed; 100 mcg IV Fentanyl Total Moderate Sedation Time:  23 minutes. The patient's level of consciousness and physiologic status were continuously monitored during the procedure by Radiology nursing. PROCEDURE: The procedure risks, benefits, and alternatives were explained to the patient. Questions regarding the procedure were encouraged and answered. The patient understands and consents to the procedure. A time-out was performed prior to initiating the procedure. The right anterolateral chest wall was prepped with chlorhexidine in a sterile fashion, and a sterile drape was applied covering the operative field. A sterile gown and sterile gloves were used for the procedure. Local anesthesia was provided with 1% Lidocaine. CT was performed to localize a right lateral pleural mass as well as right-sided pleural effusion. From an anterior approach, a 6 French Safe-T-Centesis catheter was advanced into the right pleural space under CT guidance. The catheter was attached to vacuum bottles and thoracentesis performed. Simultaneous insertion of a 17 gauge trocar needle was performed at the level of a lateral right pleural mass. A total of 4 separate 18 gauge core biopsy samples were obtained. After the biopsy procedure additional CT  images were obtained. The Safe-T-Centesis catheter was removed. COMPLICATIONS: None FINDINGS: There is a massive recurrent  right pleural effusion with multiple hyperdense pleural and subpleural masses. A lateral pleural mass measuring approximately 2.2 cm was chosen for biopsy. Solid tissue samples were obtained. The patient tolerated removal of 3 L of grossly bloody fluid. Postprocedural CT shows moderate residual fluid remaining and improved aeration of the underlying right lung with no pneumothorax. There is abnormal atelectasis and consolidation of the central lung. IMPRESSION: 1. CT-guided biopsy of 2.2 cm right lateral pleural mass. 2. CT-guided thoracentesis also performed with removal of 3 L of grossly bloody fluid. Electronically Signed   By: Aletta Edouard M.D.   On: 09/08/2018 09:10   Ct Biopsy  Result Date: 09/08/2018 CLINICAL DATA:  Large right bloody pleural effusion and multiple pleural masses. Negative cytology on prior fluid withdrawn by thoracentesis on 09/04/2018. Significant reaccumulation pleural fluid since that time by chest x-ray. Request for biopsy of pleural mass for tissue diagnosis. EXAM: 1. CT-GUIDED RIGHT THORACENTESIS 2. CT GUIDED CORE BIOPSY OF RIGHT PLEURAL MASS ANESTHESIA/SEDATION: 2.0 mg IV Versed; 100 mcg IV Fentanyl Total Moderate Sedation Time:  23 minutes. The patient's level of consciousness and physiologic status were continuously monitored during the procedure by Radiology nursing. PROCEDURE: The procedure risks, benefits, and alternatives were explained to the patient. Questions regarding the procedure were encouraged and answered. The patient understands and consents to the procedure. A time-out was performed prior to initiating the procedure. The right anterolateral chest wall was prepped with chlorhexidine in a sterile fashion, and a sterile drape was applied covering the operative field. A sterile gown and sterile gloves were used for the procedure. Local anesthesia was provided with 1% Lidocaine. CT was performed to localize a right lateral pleural mass as well as right-sided pleural  effusion. From an anterior approach, a 6 French Safe-T-Centesis catheter was advanced into the right pleural space under CT guidance. The catheter was attached to vacuum bottles and thoracentesis performed. Simultaneous insertion of a 17 gauge trocar needle was performed at the level of a lateral right pleural mass. A total of 4 separate 18 gauge core biopsy samples were obtained. After the biopsy procedure additional CT images were obtained. The Safe-T-Centesis catheter was removed. COMPLICATIONS: None FINDINGS: There is a massive recurrent right pleural effusion with multiple hyperdense pleural and subpleural masses. A lateral pleural mass measuring approximately 2.2 cm was chosen for biopsy. Solid tissue samples were obtained. The patient tolerated removal of 3 L of grossly bloody fluid. Postprocedural CT shows moderate residual fluid remaining and improved aeration of the underlying right lung with no pneumothorax. There is abnormal atelectasis and consolidation of the central lung. IMPRESSION: 1. CT-guided biopsy of 2.2 cm right lateral pleural mass. 2. CT-guided thoracentesis also performed with removal of 3 L of grossly bloody fluid. Electronically Signed   By: Aletta Edouard M.D.   On: 09/08/2018 09:10   Dg Chest Port 1 View  Result Date: 09/11/2018 CLINICAL DATA:  Increased shortness of breath EXAM: PORTABLE CHEST 1 VIEW COMPARISON:  Two days ago FINDINGS: Progressive and large right pleural effusion with near right chest white out. There is leftward mediastinal shift. Stable patchy opacity in the left lung where there are nodules by CT. IMPRESSION: 1. Large and increased right pleural effusion with mild mediastinal shift. 2. Known nodularity in the left lung. Electronically Signed   By: Monte Fantasia M.D.   On: 09/11/2018 06:07   Dg Chest [facility] [address]  Result Date: 09/09/2018 CLINICAL DATA:  Acute respiratory failure. EXAM: PORTABLE CHEST 1 VIEW COMPARISON:  Chest CT 09/07/2018 FINDINGS:  Cardiomediastinal silhouette is normal. Mediastinal contours appear intact. Calcific atherosclerotic disease and tortuosity of the aorta. There is no evidence of pneumothorax. Minimally decreased right pleural effusion, still large in volume. Ill-defined right upper lobe and left lower hemithorax pulmonary masses. Osseous structures are without acute abnormality. Soft tissues are grossly normal. IMPRESSION: Minimally decreased in volume large right pleural effusion. Bilateral pulmonary masses. Calcific atherosclerotic disease and tortuosity of the aorta. Electronically Signed   By: Fidela Salisbury M.D.   On: 09/09/2018 17:46   Dg Chest Port 1 View  Result Date: 09/04/2018 CLINICAL DATA:  Shortness of Breath EXAM: PORTABLE CHEST 1 VIEW COMPARISON:  None. FINDINGS: Cardiac shadow is stable. Nodular changes are noted in the left lung base suspicious for metastatic disease. Large right-sided pleural effusion with underlying atelectasis is noted. Bony structures are within normal limits. IMPRESSION: Large right-sided pleural effusion with underlying atelectatic changes. Multiple nodular densities are noted over the left base suspicious for neoplastic involvement. CT is recommended for further evaluation. Electronically Signed   By: Inez Catalina M.D.   On: 09/04/2018 14:33   Vas [place] Lower Extremity Venous (dvt)  Result Date: 09/19/2018  Lower Venous Study Indications: Edema.  Performing Technologist: June Leap RDMS, RVT  Examination Guidelines: A complete evaluation includes B-mode imaging, spectral Doppler, color Doppler, and power Doppler as needed of all accessible portions of each vessel. Bilateral testing is considered an integral part of a complete examination. Limited examinations for reoccurring indications may be performed as noted.  Right Venous Findings: +---------+---------------+---------+-----------+----------+-------+          CompressibilityPhasicitySpontaneityPropertiesSummary  +---------+---------------+---------+-----------+----------+-------+ CFV      Full           Yes      Yes                          +---------+---------------+---------+-----------+----------+-------+ SFJ      Full                                                 +---------+---------------+---------+-----------+----------+-------+ FV Prox  Full                                                 +---------+---------------+---------+-----------+----------+-------+ FV Mid   Full                                                 +---------+---------------+---------+-----------+----------+-------+ FV DistalFull                                                 +---------+---------------+---------+-----------+----------+-------+ PFV      Full                                                 +---------+---------------+---------+-----------+----------+-------+  POP      Full           Yes      Yes                          +---------+---------------+---------+-----------+----------+-------+ PTV      Full                                                 +---------+---------------+---------+-----------+----------+-------+ PERO     Full                                                 +---------+---------------+---------+-----------+----------+-------+  Left Venous Findings: +---------+---------------+---------+-----------+----------+-------+          CompressibilityPhasicitySpontaneityPropertiesSummary +---------+---------------+---------+-----------+----------+-------+ CFV      Full           Yes      Yes                          +---------+---------------+---------+-----------+----------+-------+ SFJ      Full                                                 +---------+---------------+---------+-----------+----------+-------+ FV Prox  Full                                                  +---------+---------------+---------+-----------+----------+-------+ FV Mid   Full                                                 +---------+---------------+---------+-----------+----------+-------+ FV DistalFull                                                 +---------+---------------+---------+-----------+----------+-------+ PFV      Full                                                 +---------+---------------+---------+-----------+----------+-------+ POP      Full           Yes      Yes                          +---------+---------------+---------+-----------+----------+-------+ PTV      Full                                                 +---------+---------------+---------+-----------+----------+-------+  PERO     Full                                                 +---------+---------------+---------+-----------+----------+-------+    Summary: Right: There is no evidence of deep vein thrombosis in the lower extremity. No cystic structure found in the popliteal fossa. Left: There is no evidence of deep vein thrombosis in the lower extremity. No cystic structure found in the popliteal fossa.  *See table(s) above for measurements and observations. Electronically signed by Curt Jews MD on 09/19/2018 at 9:42:06 PM.    Final    Ir Perc Pleural Drain Genia Harold Cath W/img Guide  Result Date: 09/11/2018 INDICATION: Malignant right pleural effusion EXAM: ULTRASOUND AND FLUOROSCOPIC RIGHT CHEST TUNNELED PLEURAL DRAIN (PLEURX CATHETER) MEDICATIONS: The patient is currently admitted to the hospital and receiving intravenous antibiotics. The antibiotics were administered within an appropriate time frame prior to the initiation of the procedure. ANESTHESIA/SEDATION: Fentanyl 100 mcg IV; Versed 2.0 mg IV Moderate Sedation Time:  11 MINUTES The patient was continuously monitored during the procedure by the interventional radiology nurse under my direct supervision.  COMPLICATIONS: None immediate. PROCEDURE: Informed written consent was obtained from the patient after a thorough discussion of the procedural risks, benefits and alternatives. All questions were addressed. Maximal Sterile Barrier Technique was utilized including caps, mask, sterile gowns, sterile gloves, sterile drape, hand hygiene and skin antiseptic. A timeout was performed prior to the initiation of the procedure. previous imaging reviewed. preliminary ultrasound performed. the complex loculated right pleural effusion was marked in the mid axillary line through a lower intercostal space. under sterile conditions and local anesthesia, ultrasound percutaneous access performed of the right chest. needle position confirmed with ultrasound. images obtained for documentation. amplatz guidewire inserted. the pleurx catheter was tunneled subcutaneously to the puncture site and advanced into the chest through a peel-away sheath. position confirmed with ultrasound and fluoroscopy. 1.2 l thoracentesis performed following insertion. catheter secured with prolene suture. entry site closed with subcuticular vicryl suture and dermabond. no immediate complication. patient tolerated the procedure well. IMPRESSION: Successful ultrasound fluoroscopic right chest tunneled pleural drain (PleurX catheter). 1.2 L thoracentesis performed after insertion. Electronically Signed   By: Jerilynn Mages.  Shick M.D.   On: 09/11/2018 17:30   US Thoracentesis Asp Pleural Space W/img Guide  Result Date: 09/04/2018 INDICATION: Patient with history of dyspnea, imaging findings of right lung mass with multiple pulmonary metastases, large right pleural effusion. Request made for diagnostic and therapeutic right thoracentesis. EXAM: ULTRASOUND GUIDED DIAGNOSTIC AND THERAPEUTIC RIGHT THORACENTESIS MEDICATIONS: None COMPLICATIONS: None immediate. PROCEDURE: An ultrasound guided thoracentesis was thoroughly discussed with the patient and questions answered. The  benefits, risks, alternatives and complications were also discussed. The patient understands and wishes to proceed with the procedure. Written consent was obtained. Ultrasound was performed to localize and mark an adequate pocket of fluid in the right chest. The area was then prepped and draped in the normal sterile fashion. 1% Lidocaine was used for local anesthesia. Under ultrasound guidance a 6 Fr Safe-T-Centesis catheter was introduced. Thoracentesis was performed. The catheter was removed and a dressing applied. FINDINGS: A total of approximately 2.3 liters of dark,bloody fluid was removed. Samples were sent to the laboratory as requested by the clinical team. IMPRESSION: Successful ultrasound guided diagnostic and therapeutic right thoracentesis yielding 2.3 liters of pleural fluid. Read by: Rowe Robert,  PA-C Electronically Signed   By: Aletta Edouard M.D.   On: 09/04/2018 16:46     Subjective: Off-and-on gets exertional shortness of breath with minimal movement.   Discharge Exam: Vitals:   09/25/18 0552 09/25/18 0957  BP: 120/61   Pulse: (!) 103 (!) 113  Resp: 20 (!) 21  Temp: 98.7 F (37.1 C)   SpO2: 92% 96%   Vitals:   09/24/18 1404 09/24/18 2244 09/25/18 0552 09/25/18 0957  BP: 127/65  120/61   Pulse: (!) 105  (!) 103 (!) 113  Resp: 20  20 (!) 21  Temp: 99 F (37.2 C)  98.7 F (37.1 C)   TempSrc: Oral  Oral   SpO2: 96% 96% 92% 96%  Weight:      Height:        General: Pt is alert, awake, not in acute distress Cardiovascular: RRR, S1/S2 +, no rubs, no gallops Respiratory: Diminished breath sounds on the right side at the base. Abdominal: Soft, NT, ND, bowel sounds + Extremities: no edema, no cyanosis Right chest wall Pleurx catheter noted.   The results of significant diagnostics from this hospitalization (including imaging, microbiology, ancillary and laboratory) are listed below for reference.     Microbiology: Recent Results (from the past 240 hour(s))   Culture, blood (Routine X 2) w Reflex to ID Panel     Status: None (Preliminary result)   Collection Time: 09/22/18  3:53 PM  Result Value Ref Range Status   Specimen Description   Final    BLOOD BLOOD RIGHT HAND Performed at [facility], [place] [address], [place], [place] [address]    Special Requests   Final    BOTTLES DRAWN AEROBIC ONLY Blood Culture adequate volume Performed at [place] [address] [place], [place] [address]    Culture   Final    NO GROWTH 3 DAYS Performed at [facility], [place] [address], [place], [place] [address]    Report Status PENDING  Incomplete  Culture, blood (Routine X 2) w Reflex to ID Panel     Status: None (Preliminary result)   Collection Time: 09/22/18  3:56 PM  Result Value Ref Range Status   Specimen Description   Final    BLOOD BLOOD LEFT HAND Performed at [place] [address] [place], [place] [address]    Special Requests   Final    BOTTLES DRAWN AEROBIC ONLY Blood Culture adequate volume Performed at [place] [address], [place], [place] [address]    Culture   Final    NO GROWTH 3 DAYS Performed at [facility], [place] [address], [place], [place] [address]    Report Status PENDING  Incomplete  Respiratory Panel by PCR     Status: None   Collection Time: 09/22/18  5:33 PM  Result Value Ref Range Status   Adenovirus NOT DETECTED NOT DETECTED Final   Coronavirus 229E NOT DETECTED NOT DETECTED Final    Comment: (NOTE) The Coronavirus on the Respiratory Panel, DOES NOT test for the novel  Coronavirus (2019 nCoV)    Coronavirus HKU1 NOT DETECTED NOT DETECTED Final   Coronavirus NL63 NOT DETECTED NOT DETECTED Final   Coronavirus OC43 NOT DETECTED NOT DETECTED Final   Metapneumovirus NOT DETECTED NOT DETECTED Final   Rhinovirus / Enterovirus NOT DETECTED NOT DETECTED Final   Influenza A NOT DETECTED NOT DETECTED Final    Influenza B NOT DETECTED NOT  DETECTED Final   Parainfluenza Virus 1 NOT DETECTED NOT DETECTED Final   Parainfluenza Virus 2 NOT DETECTED NOT DETECTED Final   Parainfluenza Virus 3 NOT DETECTED NOT DETECTED Final   Parainfluenza Virus 4 NOT DETECTED NOT DETECTED Final   Respiratory Syncytial Virus NOT DETECTED NOT DETECTED Final   Bordetella pertussis NOT DETECTED NOT DETECTED Final   Chlamydophila pneumoniae NOT DETECTED NOT DETECTED Final   Mycoplasma pneumoniae NOT DETECTED NOT DETECTED Final    Comment: Performed at [facility], [place] [address] [place], [place] [address]     Labs: BNP (last 3 results) Recent Labs    09/04/18 1334  BNP 29.7   Basic Metabolic Panel: Recent Labs  Lab 09/20/18 0328 09/21/18 0336 09/22/18 0512 09/23/18 0512 09/24/18 0521  NA 136 135 134* 134* 135  K 3.8 4.0 4.4 4.6 4.2  CL 103 100 97* 98 97*  CO2 29 28 31 30 30   GLUCOSE 101* 117* 111* 164* 100*  BUN 12 12 11 17 21   CREATININE 0.55* 0.61 0.70 0.49* 0.58*  CALCIUM 7.4* 7.5* 7.8* 7.9* 8.0*  MG  --  2.0 1.8 2.0 1.8   Liver Function Tests: Recent Labs  Lab 09/19/18 1458  AST 39  ALT 32  ALKPHOS 62  BILITOT 0.4  PROT 5.9*  ALBUMIN 1.6*   No results for input(s): LIPASE, AMYLASE in the last 168 hours. No results for input(s): AMMONIA in the last 168 hours. CBC: Recent Labs  Lab 09/19/18 1458 09/20/18 0328 09/21/18 0336 09/22/18 0512 09/23/18 0512 09/24/18 0521  WBC 13.1* 10.9* 13.9* 14.0* 12.2* 15.1*  NEUTROABS 10.8*  --   --   --   --   --   HGB 7.8* 7.1* 8.8* 9.1* 8.0* 10.2*  HCT 26.1* 23.8* 29.1* 30.5* 28.0* 33.6*  MCV 87.9 87.8 87.7 90.0 90.3 90.3  PLT 524* 431* 419* 383 411* 337   Cardiac Enzymes: No results for input(s): CKTOTAL, CKMB, CKMBINDEX, TROPONINI in the last 168 hours. BNP: Invalid input(s): POCBNP CBG: Recent Labs  Lab 09/23/18 0823 09/23/18 1145 09/23/18 1635  GLUCAP 222* 164* 136*   D-Dimer No results for input(s): DDIMER in the last 72  hours. Hgb A1c No results for input(s): HGBA1C in the last 72 hours. Lipid Profile No results for input(s): CHOL, HDL, LDLCALC, TRIG, CHOLHDL, LDLDIRECT in the last 72 hours. Thyroid function studies No results for input(s): TSH, T4TOTAL, T3FREE, THYROIDAB in the last 72 hours.  Invalid input(s): FREET3 Anemia work up No results for input(s): VITAMINB12, FOLATE, FERRITIN, TIBC, IRON, RETICCTPCT in the last 72 hours. Urinalysis    Component Value Date/Time   COLORURINE YELLOW 09/22/2018 1509   APPEARANCEUR HAZY (A) 09/22/2018 1509   LABSPEC 1.021 09/22/2018 1509   PHURINE 6.0 09/22/2018 1509   GLUCOSEU NEGATIVE 09/22/2018 1509   HGBUR MODERATE (A) 09/22/2018 1509   BILIRUBINUR MODERATE (A) 09/22/2018 1509   KETONESUR 5 (A) 09/22/2018 1509   PROTEINUR 30 (A) 09/22/2018 1509   NITRITE NEGATIVE 09/22/2018 1509   LEUKOCYTESUR NEGATIVE 09/22/2018 1509   Sepsis Labs Invalid input(s): PROCALCITONIN,  WBC,  LACTICIDVEN Microbiology Recent Results (from the past 240 hour(s))  Culture, blood (Routine X 2) w Reflex to ID Panel     Status: None (Preliminary result)   Collection Time: 09/22/18  3:53 PM  Result Value Ref Range Status   Specimen Description   Final    BLOOD BLOOD RIGHT HAND Performed at [facility], [place] [address] [place], [place]  [address]    Special Requests   Final    BOTTLES DRAWN AEROBIC ONLY Blood Culture adequate volume Performed at [place] [address] [place], [place] [address]    Culture   Final    NO GROWTH 3 DAYS Performed at [facility], [place] [address] [place], [place] [address]    Report Status PENDING  Incomplete  Culture, blood (Routine X 2) w Reflex to ID Panel     Status: None (Preliminary result)   Collection Time: 09/22/18  3:56 PM  Result Value Ref Range Status   Specimen Description   Final    BLOOD BLOOD LEFT HAND Performed at [facility] [address],  [place], [place] [address]    Special Requests   Final    BOTTLES DRAWN AEROBIC ONLY Blood Culture adequate volume Performed at [facility] [address] [place], [place] [address]    Culture   Final    NO GROWTH 3 DAYS Performed at [facility], [place] [address] [place], [place] [address]    Report Status PENDING  Incomplete  Respiratory Panel by PCR     Status: None   Collection Time: 09/22/18  5:33 PM  Result Value Ref Range Status   Adenovirus NOT DETECTED NOT DETECTED Final   Coronavirus 229E NOT DETECTED NOT DETECTED Final    Comment: (NOTE) The Coronavirus on the Respiratory Panel, DOES NOT test for the novel  Coronavirus (2019 nCoV)    Coronavirus HKU1 NOT DETECTED NOT DETECTED Final   Coronavirus NL63 NOT DETECTED NOT DETECTED Final   Coronavirus OC43 NOT DETECTED NOT DETECTED Final   Metapneumovirus NOT DETECTED NOT DETECTED Final   Rhinovirus / Enterovirus NOT DETECTED NOT DETECTED Final   Influenza A NOT DETECTED NOT DETECTED Final   Influenza B NOT DETECTED NOT DETECTED Final   Parainfluenza Virus 1 NOT DETECTED NOT DETECTED Final   Parainfluenza Virus 2 NOT DETECTED NOT DETECTED Final   Parainfluenza Virus 3 NOT DETECTED NOT DETECTED Final   Parainfluenza Virus 4 NOT DETECTED NOT DETECTED Final   Respiratory Syncytial Virus NOT DETECTED NOT DETECTED Final   Bordetella pertussis NOT DETECTED NOT DETECTED Final   Chlamydophila pneumoniae NOT DETECTED NOT DETECTED Final   Mycoplasma pneumoniae NOT DETECTED NOT DETECTED Final    Comment: Performed at [facility], [place]. [address] [place],  [address]     Time coordinating discharge:  I have spent 35 minutes face to face with the patient and on the ward discussing the patients care, assessment, plan and disposition with other care givers. >50% of the time was devoted counseling the patient about the risks and benefits of treatment/Discharge disposition and coordinating care.    SIGNED:   Damita Lack, MD  [facility] 09/25/2018, 12:28 PM   If 7PM-7AM, please contact night-coverage [email or web address]

## 2018-09-25 NOTE — Clinical Social Work Placement (Signed)
Pt admitting to [facility] SN[facility] room 3222 - facility will call for report. [facility] transportation arranged.   CLINICAL SOCIAL WORK PLACEMENT  NOTE  Date:  09/25/2018  Patient Details  Name: Brian Ramirez MRN: [id number] Date of Birth: [birth year]  Clinical Social Work is seeking post-discharge placement for this patient at the [place] level of care (*CSW will initial, date and re-position this form in  chart as items are completed):  Yes   Patient/family provided with [place] Work Department's list of facilities offering this level of care within the geographic area requested by the patient (or if unable, by the patient's family).  Yes   Patient/family informed of their freedom to choose among providers that offer the needed level of care, that participate in Medicare, Medicaid or managed care program needed by the patient, have an available bed and are willing to accept the patient.  Yes   Patient/family informed of [facility]'s ownership interest in [facility] and [facility], as well as of the fact that they are under no obligation to receive care at these facilities.  PASRR submitted to EDS on 09/21/18     PASRR number received on 09/21/18     Existing PASRR number confirmed on       FL2 transmitted to all facilities in geographic area requested by pt/family on 09/21/18     FL2 transmitted to all facilities within larger geographic area on       Patient informed that his/her managed care company has contracts with or will negotiate with certain facilities, including the following:        Yes   Patient/family informed of bed offers received.  Patient chooses bed at [facility]     Physician recommends and patient chooses bed at [facility]    Patient to be transferred to [facility] on 09/25/18.  Patient to be transferred to facility by PTAR     Patient family notified on  09/25/18 of transfer.  Name of family member notified:  Pt notified brother     PHYSICIAN       Additional Comment:    _______________________________________________ Nila Nephew, LCSW 09/25/2018, 2:19 PM [phone]

## 2018-09-25 NOTE — Progress Notes (Signed)
MD wants to give 1st Keytruda on 2/13 or 2/14 (patient will be discharged to [facility] today). Email sent to managed care for authorization process and high priority scheduling message sent. New orders: Lab on 2/13 or 2/14 via flush nurse (has PICC).  Lab/flush/OV/chemo on 10/15/18 ([place]/Alimta/Keytruda) High priority scheduling message sent at MD request for appointments to be on his discharge paperwork. Will also need CBC/Diff at [facility] on 11/02/18. Will fax orders when he is discharged.

## 2018-09-25 NOTE — Progress Notes (Signed)
Pleurex cath drained per MD order. 300 cc of dark red fluid removed. Pt tolerlated well. Will continue to monitor.

## 2018-09-25 NOTE — Progress Notes (Signed)
IP PROGRESS NOTE  Subjective:   Mr. Zentz was able to ambulate with physical therapy yesterday.  He has decided on transfer to the [facility] skilled nursing facility.  He continues to have exertional dyspnea.  No nausea. Objective: Vital signs in last 24 hours: Blood pressure 118/71, pulse (!) 112, temperature 98 F (36.7 C), temperature source Axillary, resp. rate 20, height '6\' 1"'  (1.854 m), weight 159 lb 6.3 oz (72.3 kg), SpO2 95 %.  Intake/Output from previous day: 02/05 0701 - 02/06 0700 In: 857.5 [I.V.:857.5] Out: 1750 [Urine:800; Chest Tube:950]  Physical Exam:  HEENT: No thrush Lungs: Decreased breath sounds throughout the right chest, inspiratory/expiratory rhonchi at the left lower chest, no respiratory distress Cardiac: Regular rhythm, tachycardia Abdomen: No hepatosplenomegaly Extremities: Pitting edema at the  left greater than right lower leg  Right Pleurx catheter site with a gauze dressing   Lab Results: Recent Labs    09/19/18 1458 09/20/18 0328  WBC 13.1* 10.9*  HGB 7.8* 7.1*  HCT 26.1* 23.8*  PLT 524* 431*  Hemoglobin 10.2  BMET Recent Labs    09/19/18 1458 09/20/18 0328  NA 135 136  K 4.0 3.8  CL 99 103  CO2 32 29  GLUCOSE 117* 101*  BUN 13 12  CREATININE 0.66 0.55*  CALCIUM 7.8* 7.4*  Creatinine 0.6    Studies/Results: Dg Chest 2 View  Result Date: 09/19/2018 CLINICAL DATA:  Dyspnea. History of lung cancer with recurrent RIGHT pleural effusion, PleurX catheter placed 1 week ago. EXAM: CHEST - 2 VIEW COMPARISON:  Chest radiograph September 11, 2018 FINDINGS: Slightly decreased residual moderate to large RIGHT pleural effusion with pleural vac catheter projecting in RIGHT lung base. Interstitial prominence with scattered nodular densities. RIGHT upper lobe density at site of known mass. Cardiac silhouette predominantly obscured. Calcified aortic arch. No pneumothorax. IMPRESSION: 1. Moderate to large residual RIGHT pleural effusion with  pleural vac catheter in place. 2.  Aortic Atherosclerosis (ICD10-I70.0). Electronically Signed   By: Elon Alas M.D.   On: 09/19/2018 16:56   Vas [place] Lower Extremity Venous (dvt)  Result Date: 09/19/2018  Lower Venous Study Indications: Edema.  Performing Technologist: June Leap RDMS, RVT  Examination Guidelines: A complete evaluation includes B-mode imaging, spectral Doppler, color Doppler, and power Doppler as needed of all accessible portions of each vessel. Bilateral testing is considered an integral part of a complete examination. Limited examinations for reoccurring indications may be performed as noted.  Right Venous Findings: +---------+---------------+---------+-----------+----------+-------+          CompressibilityPhasicitySpontaneityPropertiesSummary +---------+---------------+---------+-----------+----------+-------+ CFV      Full           Yes      Yes                          +---------+---------------+---------+-----------+----------+-------+ SFJ      Full                                                 +---------+---------------+---------+-----------+----------+-------+ FV Prox  Full                                                 +---------+---------------+---------+-----------+----------+-------+ FV Mid   Full                                                 +---------+---------------+---------+-----------+----------+-------+  FV DistalFull                                                 +---------+---------------+---------+-----------+----------+-------+ PFV      Full                                                 +---------+---------------+---------+-----------+----------+-------+ POP      Full           Yes      Yes                          +---------+---------------+---------+-----------+----------+-------+ PTV      Full                                                  +---------+---------------+---------+-----------+----------+-------+ PERO     Full                                                 +---------+---------------+---------+-----------+----------+-------+  Left Venous Findings: +---------+---------------+---------+-----------+----------+-------+          CompressibilityPhasicitySpontaneityPropertiesSummary +---------+---------------+---------+-----------+----------+-------+ CFV      Full           Yes      Yes                          +---------+---------------+---------+-----------+----------+-------+ SFJ      Full                                                 +---------+---------------+---------+-----------+----------+-------+ FV Prox  Full                                                 +---------+---------------+---------+-----------+----------+-------+ FV Mid   Full                                                 +---------+---------------+---------+-----------+----------+-------+ FV DistalFull                                                 +---------+---------------+---------+-----------+----------+-------+ PFV      Full                                                 +---------+---------------+---------+-----------+----------+-------+  POP      Full           Yes      Yes                          +---------+---------------+---------+-----------+----------+-------+ PTV      Full                                                 +---------+---------------+---------+-----------+----------+-------+ PERO     Full                                                 +---------+---------------+---------+-----------+----------+-------+    Summary: Right: There is no evidence of deep vein thrombosis in the lower extremity. No cystic structure found in the popliteal fossa. Left: There is no evidence of deep vein thrombosis in the lower extremity. No cystic structure found in the popliteal fossa.  *See  table(s) above for measurements and observations. Electronically signed by Curt Jews MD on 09/19/2018 at 9:42:06 PM.    Final     Medications: I have reviewed the patient's current medications.  Assessment/Plan:  1.  Metastatic non-small cell lung cancer  large right pleural effusion, right lung mass, right pleural-based masses, left lung nodules  Right thoracentesis 09/04/2018-negative cytology  CT biopsy of right lateral pleural mass 09/07/2018-poorly differentiated non-small cell carcinoma, malignant cells positive for cytokeratin AE1/AE3 and cytokeratin 8/18, histology favoring adenocarcinoma, MSS, tumor mutation burden 5.no EGFR, ALK, BRAF alteration  PDL 1 tumor proportion score-100%   Cycle 1 Alimta/carboplatin 09/22/2018  2.Severe anemia-likely secondary to bleeding in the right pleural space and metastatic carcinoma, red cell transfusions 09/09/2018 09/10/2018, 09/23/2018 3.Dyspnea secondary to the large right pleural effusion and anemia  Right Pleurx catheter placed 09/11/2018 4.History of tobacco use 5.History of kidney stones 6.  Fever-most likely tumor fever, improved   Mr. Dady is now at day 4 following cycle 1 Alimta/carboplatin.  He appears stable.  He is scheduled for transfer to the [facility] skilled nursing facility.  The plan is to begin pembrolizumab at the Cancer center later this week.  We will arrange for a CBC at [facility] next week and outpatient follow-up will be scheduled at the Cancer center for cycle 2 Alimta on 10/15/2018.Marland Kitchen   Recommendations: 1.  Drain Pleurx catheter daily until drainage is less than 100 cc/day 2.  Okay for transfer to [facility] from an oncology standpoint 3.  Continue skin care for the sacral decubitus 4.  Outpatient follow-up at the Cancer center for pembrolizumab 09/27/2018    LOS: 1 day   Betsy Coder, MD   09/20/2018, 3:32 PM

## 2018-09-25 NOTE — Consult Note (Signed)
   [facility] Inpatient Consult   09/25/2018  JACOLBY RISBY 1946/06/22 [id number]     Patient screened for potential [facility] Care Management services due to hospital readmission.  Chart reviewed. Noted patient is slated to discharge to SNF today. No identifiable [facility] Care Management needs at this time.   Marthenia Rolling, MSN-Ed, RN,BSN [facility] Liaison [phone]

## 2018-09-25 NOTE — Progress Notes (Addendum)
Facility to call for unable. They are unable to receive incoming calls at this time per social work.  Pt discharged to [facility] in stable condition.

## 2018-09-26 DIAGNOSIS — C7989 Secondary malignant neoplasm of other specified sites: Secondary | ICD-10-CM | POA: Diagnosis not present

## 2018-09-26 DIAGNOSIS — C3491 Malignant neoplasm of unspecified part of right bronchus or lung: Secondary | ICD-10-CM | POA: Diagnosis not present

## 2018-09-26 DIAGNOSIS — J910 Malignant pleural effusion: Secondary | ICD-10-CM | POA: Diagnosis not present

## 2018-09-26 DIAGNOSIS — D638 Anemia in other chronic diseases classified elsewhere: Secondary | ICD-10-CM | POA: Diagnosis not present

## 2018-09-27 ENCOUNTER — Encounter: Payer: Self-pay | Admitting: *Deleted

## 2018-09-27 LAB — CULTURE, BLOOD (ROUTINE X 2)
Culture: NO GROWTH
Culture: NO GROWTH
Special Requests: ADEQUATE
Special Requests: ADEQUATE

## 2018-09-27 NOTE — Progress Notes (Signed)
Oncology Nurse Navigator Documentation  Oncology Nurse Navigator Flowsheets 09/27/2018  Navigator Location [facility]  Navigator Encounter Type Other/Patient has been discharged from the hospital and is at [facility].  I called them to update their transportation service on his upcoming appts.  They verbalized understanding of appt and will get patient here.   Patient Visit Type Other  Treatment Phase Treatment  Barriers/Navigation Needs Coordination of Care  Interventions Coordination of Care  Coordination of Care Other  Acuity Level 1  Time Spent with Patient 15

## 2018-09-28 ENCOUNTER — Other Ambulatory Visit: Payer: Self-pay | Admitting: Hematology

## 2018-09-28 ENCOUNTER — Inpatient Hospital Stay: Payer: Medicare Other

## 2018-09-28 ENCOUNTER — Other Ambulatory Visit: Payer: Self-pay | Admitting: Oncology

## 2018-09-28 ENCOUNTER — Encounter: Payer: Self-pay | Admitting: General Practice

## 2018-09-28 ENCOUNTER — Inpatient Hospital Stay: Payer: Medicare Other | Admitting: General Practice

## 2018-09-28 VITALS — BP 135/70 | HR 108 | Temp 99.1°F

## 2018-09-28 DIAGNOSIS — R531 Weakness: Secondary | ICD-10-CM | POA: Diagnosis not present

## 2018-09-28 DIAGNOSIS — C3491 Malignant neoplasm of unspecified part of right bronchus or lung: Secondary | ICD-10-CM | POA: Diagnosis not present

## 2018-09-28 DIAGNOSIS — I4891 Unspecified atrial fibrillation: Secondary | ICD-10-CM | POA: Insufficient documentation

## 2018-09-28 DIAGNOSIS — C3490 Malignant neoplasm of unspecified part of unspecified bronchus or lung: Secondary | ICD-10-CM

## 2018-09-28 DIAGNOSIS — Z5112 Encounter for antineoplastic immunotherapy: Secondary | ICD-10-CM | POA: Diagnosis not present

## 2018-09-28 DIAGNOSIS — J90 Pleural effusion, not elsewhere classified: Secondary | ICD-10-CM | POA: Diagnosis not present

## 2018-09-28 DIAGNOSIS — J9601 Acute respiratory failure with hypoxia: Secondary | ICD-10-CM | POA: Diagnosis not present

## 2018-09-28 DIAGNOSIS — J910 Malignant pleural effusion: Secondary | ICD-10-CM | POA: Diagnosis not present

## 2018-09-28 DIAGNOSIS — Z87891 Personal history of nicotine dependence: Secondary | ICD-10-CM | POA: Insufficient documentation

## 2018-09-28 DIAGNOSIS — Z79899 Other long term (current) drug therapy: Secondary | ICD-10-CM | POA: Insufficient documentation

## 2018-09-28 DIAGNOSIS — E46 Unspecified protein-calorie malnutrition: Secondary | ICD-10-CM | POA: Diagnosis not present

## 2018-09-28 DIAGNOSIS — D649 Anemia, unspecified: Secondary | ICD-10-CM | POA: Diagnosis not present

## 2018-09-28 DIAGNOSIS — R600 Localized edema: Secondary | ICD-10-CM | POA: Diagnosis not present

## 2018-09-28 LAB — CBC WITH DIFFERENTIAL (CANCER CENTER ONLY)
Abs Immature Granulocytes: 0.03 10*3/uL (ref 0.00–0.07)
Basophils Absolute: 0 10*3/uL (ref 0.0–0.1)
Basophils Relative: 0 %
Eosinophils Absolute: 0 10*3/uL (ref 0.0–0.5)
Eosinophils Relative: 1 %
HCT: 30 % — ABNORMAL LOW (ref 39.0–52.0)
Hemoglobin: 9.1 g/dL — ABNORMAL LOW (ref 13.0–17.0)
Immature Granulocytes: 1 %
LYMPHS PCT: 12 %
Lymphs Abs: 0.5 10*3/uL — ABNORMAL LOW (ref 0.7–4.0)
MCH: 26.6 pg (ref 26.0–34.0)
MCHC: 30.3 g/dL (ref 30.0–36.0)
MCV: 87.7 fL (ref 80.0–100.0)
Monocytes Absolute: 0.1 10*3/uL (ref 0.1–1.0)
Monocytes Relative: 4 %
Neutro Abs: 3 10*3/uL (ref 1.7–7.7)
Neutrophils Relative %: 82 %
Platelet Count: 208 10*3/uL (ref 150–400)
RBC: 3.42 MIL/uL — ABNORMAL LOW (ref 4.22–5.81)
RDW: 15.8 % — ABNORMAL HIGH (ref 11.5–15.5)
WBC: 3.6 10*3/uL — AB (ref 4.0–10.5)
nRBC: 0 % (ref 0.0–0.2)

## 2018-09-28 LAB — SAMPLE TO BLOOD BANK

## 2018-09-28 LAB — CMP (CANCER CENTER ONLY)
ALK PHOS: 128 U/L — AB (ref 38–126)
ALT: 61 U/L — ABNORMAL HIGH (ref 0–44)
ANION GAP: 8 (ref 5–15)
AST: 49 U/L — ABNORMAL HIGH (ref 15–41)
Albumin: 1.4 g/dL — ABNORMAL LOW (ref 3.5–5.0)
BUN: 16 mg/dL (ref 8–23)
CO2: 32 mmol/L (ref 22–32)
Calcium: 8.2 mg/dL — ABNORMAL LOW (ref 8.9–10.3)
Chloride: 96 mmol/L — ABNORMAL LOW (ref 98–111)
Creatinine: 0.6 mg/dL — ABNORMAL LOW (ref 0.61–1.24)
GFR, Est AFR Am: >60 mL/min (ref >60)
GFR, Estimated: >60 mL/min (ref >60)
Glucose, Bld: 134 mg/dL — ABNORMAL HIGH (ref 70–99)
Potassium: 4.2 mmol/L (ref 3.5–5.1)
SODIUM: 136 mmol/L (ref 135–145)
Total Bilirubin: 0.8 mg/dL (ref 0.3–1.2)
Total Protein: 5.7 g/dL — ABNORMAL LOW (ref 6.5–8.1)

## 2018-09-28 MED ORDER — SODIUM CHLORIDE 0.9 % IV SOLN
Freq: Once | INTRAVENOUS | Status: AC
  Administered 2018-09-28: 16:00:00 via INTRAVENOUS
  Filled 2018-09-28: qty 250

## 2018-09-28 MED ORDER — PROCHLORPERAZINE MALEATE 10 MG PO TABS
ORAL_TABLET | ORAL | Status: AC
  Filled 2018-09-28: qty 1

## 2018-09-28 MED ORDER — SODIUM CHLORIDE 0.9 % IV SOLN
200.0000 mg | Freq: Once | INTRAVENOUS | Status: AC
  Administered 2018-09-28: 200 mg via INTRAVENOUS
  Filled 2018-09-28: qty 8

## 2018-09-28 MED ORDER — PROCHLORPERAZINE EDISYLATE 10 MG/2ML IJ SOLN
INTRAMUSCULAR | Status: AC
  Filled 2018-09-28: qty 2

## 2018-09-28 MED ORDER — SODIUM CHLORIDE 0.9% FLUSH
10.0000 mL | INTRAVENOUS | Status: DC | PRN
  Filled 2018-09-28: qty 10

## 2018-09-28 NOTE — Progress Notes (Signed)
Okay to treat per Dr. [name] with elevated HR.

## 2018-09-28 NOTE — Progress Notes (Signed)
[place] CSW Progress Notes  Brief visit in infusion to check on SNF placement and patient progress.  Is at [facility], pleased w physical therapy and progress towards regaining strength.  Does not like food.  Aware this is a short term placement and he will likely return home soon.  Says he is practicing going up /down stairs and anticipates being able to negotiate stairs when he returns home.  No other concerns at this time.    Edwyna Shell, LCSW Clinical Social Worker Phone:  [phone]

## 2018-09-28 NOTE — Patient Instructions (Addendum)
[facility] Discharge Instructions for Patients Receiving Chemotherapy  Today you received the following chemotherapy agents: Keytruda  To help prevent nausea and vomiting after your treatment, we encourage you to take your nausea medication as directed.   If you develop nausea and vomiting that is not controlled by your nausea medication, call the clinic.   BELOW ARE SYMPTOMS THAT SHOULD BE REPORTED IMMEDIATELY:  *FEVER GREATER THAN 100.5 F  *CHILLS WITH OR WITHOUT FEVER  NAUSEA AND VOMITING THAT IS NOT CONTROLLED WITH YOUR NAUSEA MEDICATION  *UNUSUAL SHORTNESS OF BREATH  *UNUSUAL BRUISING OR BLEEDING  TENDERNESS IN MOUTH AND THROAT WITH OR WITHOUT PRESENCE OF ULCERS  *URINARY PROBLEMS  *BOWEL PROBLEMS  UNUSUAL RASH Items with * indicate a potential emergency and should be followed up as soon as possible.  Feel free to call the clinic should you have any questions or concerns. The clinic phone number is [phone].  Please show the [place] at check-in to the Emergency Department and triage nurse.  Pembrolizumab injection What is this medicine? PEMBROLIZUMAB (pem broe liz ue mab) is a monoclonal antibody. It is used to treat cervical cancer, esophageal cancer, head and neck cancer, hepatocellular cancer, Hodgkin lymphoma, kidney cancer, lymphoma, melanoma, Merkel cell carcinoma, lung cancer, stomach cancer, urothelial cancer, and cancers that have a certain genetic condition. This medicine may be used for other purposes; ask your health care provider or pharmacist if you have questions. COMMON BRAND NAME(S): Keytruda What should I tell my health care provider before I take this medicine? They need to know if you have any of these conditions: -diabetes -immune system problems -inflammatory bowel disease -liver disease -lung or breathing disease -lupus -received or scheduled to receive an organ transplant or a stem-cell transplant that uses  donor stem cells -an unusual or allergic reaction to pembrolizumab, other medicines, foods, dyes, or preservatives -pregnant or trying to get pregnant -breast-feeding How should I use this medicine? This medicine is for infusion into a vein. It is given by a health care professional in a hospital or clinic setting. A special MedGuide will be given to you before each treatment. Be sure to read this information carefully each time. Talk to your pediatrician regarding the use of this medicine in children. While this drug may be prescribed for selected conditions, precautions do apply. Overdosage: If you think you have taken too much of this medicine contact a poison control center or emergency room at once. NOTE: This medicine is only for you. Do not share this medicine with others. What if I miss a dose? It is important not to miss your dose. Call your doctor or health care professional if you are unable to keep an appointment. What may interact with this medicine? Interactions have not been studied. Give your health care provider a list of all the medicines, herbs, non-prescription drugs, or dietary supplements you use. Also tell them if you smoke, drink alcohol, or use illegal drugs. Some items may interact with your medicine. This list may not describe all possible interactions. Give your health care provider a list of all the medicines, herbs, non-prescription drugs, or dietary supplements you use. Also tell them if you smoke, drink alcohol, or use illegal drugs. Some items may interact with your medicine. What should I watch for while using this medicine? Your condition will be monitored carefully while you are receiving this medicine. You may need blood work done while you are taking this medicine. Do not become pregnant while  taking this medicine or for 4 months after stopping it. Women should inform their doctor if they wish to become pregnant or think they might be pregnant. There is a  potential for serious side effects to an unborn child. Talk to your health care professional or pharmacist for more information. Do not breast-feed an infant while taking this medicine or for 4 months after the last dose. What side effects may I notice from receiving this medicine? Side effects that you should report to your doctor or health care professional as soon as possible: -allergic reactions like skin rash, itching or hives, swelling of the face, lips, or tongue -bloody or black, tarry -breathing problems -changes in vision -chest pain -chills -confusion -constipation -cough -diarrhea -dizziness or feeling faint or lightheaded -fast or irregular heartbeat -fever -flushing -hair loss -joint pain -low blood counts - this medicine may decrease the number of white blood cells, red blood cells and platelets. You may be at increased risk for infections and bleeding. -muscle pain -muscle weakness -persistent headache -redness, blistering, peeling or loosening of the skin, including inside the mouth -signs and symptoms of high blood sugar such as dizziness; dry mouth; dry skin; fruity breath; nausea; stomach pain; increased hunger or thirst; increased urination -signs and symptoms of kidney injury like trouble passing urine or change in the amount of urine -signs and symptoms of liver injury like dark urine, light-colored stools, loss of appetite, nausea, right upper belly pain, yellowing of the eyes or skin -sweating -swollen lymph nodes -weight loss Side effects that usually do not require medical attention (report to your doctor or health care professional if they continue or are bothersome): -decreased appetite -muscle pain -tiredness This list may not describe all possible side effects. Call your doctor for medical advice about side effects. You may report side effects to FDA at 1-800-FDA-1088. Where should I keep my medicine? This drug is given in a hospital or clinic and  will not be stored at home. NOTE: This sheet is a summary. It may not cover all possible information. If you have questions about this medicine, talk to your doctor, pharmacist, or health care provider.  2019 Elsevier/Gold Standard (2018-03-15 15:06:10)

## 2018-10-02 DIAGNOSIS — C3491 Malignant neoplasm of unspecified part of right bronchus or lung: Secondary | ICD-10-CM | POA: Diagnosis not present

## 2018-10-02 DIAGNOSIS — M6281 Muscle weakness (generalized): Secondary | ICD-10-CM | POA: Diagnosis not present

## 2018-10-02 DIAGNOSIS — C7989 Secondary malignant neoplasm of other specified sites: Secondary | ICD-10-CM | POA: Diagnosis not present

## 2018-10-02 DIAGNOSIS — J910 Malignant pleural effusion: Secondary | ICD-10-CM | POA: Diagnosis not present

## 2018-10-03 DIAGNOSIS — J910 Malignant pleural effusion: Secondary | ICD-10-CM | POA: Diagnosis not present

## 2018-10-03 DIAGNOSIS — R070 Pain in throat: Secondary | ICD-10-CM | POA: Diagnosis not present

## 2018-10-03 DIAGNOSIS — C3491 Malignant neoplasm of unspecified part of right bronchus or lung: Secondary | ICD-10-CM | POA: Diagnosis not present

## 2018-10-03 DIAGNOSIS — R600 Localized edema: Secondary | ICD-10-CM | POA: Diagnosis not present

## 2018-10-04 ENCOUNTER — Telehealth: Payer: Self-pay | Admitting: *Deleted

## 2018-10-04 NOTE — Telephone Encounter (Addendum)
Called nurse to inquire if CBC/diff was drawn today as ordered on 10/01/18. Nurse will follow up and call back. Provided fax number to send results. Informed her of the importance of the timing of these labs and and have them done tomorrow stat if not done today. Nurse Nia called back and they have no record of the faxed orders sent on 10/01/18 (received confirmation). Re-faxed today and they will draw blood today and should have results by this evening and will fax.

## 2018-10-05 ENCOUNTER — Other Ambulatory Visit: Payer: Self-pay | Admitting: *Deleted

## 2018-10-05 DIAGNOSIS — C3490 Malignant neoplasm of unspecified part of unspecified bronchus or lung: Secondary | ICD-10-CM

## 2018-10-05 DIAGNOSIS — D649 Anemia, unspecified: Secondary | ICD-10-CM

## 2018-10-05 NOTE — Telephone Encounter (Signed)
Hgb reading 7.6 on 10/04/18 and was 9.1 on 09/28/18. Called SNF nurse and requested that she have PA/NP at facility to evaluate the patient regarding bleeding and urgency of need for transfusion. Most likely he will need to be transported to hospital if need is urgent.  Spoke with Solicitor, Lennette Bihari and they are not able to transport him today or Saturday. Would be able to bring him in on Monday if early in morning. Scheduling message sent to determine if we could transfuse him on Monday at the [facility] if transfusion is not needed until that time based on provider evaluation.

## 2018-10-05 NOTE — Telephone Encounter (Signed)
PA called to report he is doing well. Ambulates with PT and is going up few stairs without any significant increase in shortness of breath. Pulse is around 110 at rest and SBP runs 120-100. Pleurx drainage is serous. No signs of bleeding. Looks stronger than he did on admission. She feels he is stable to wait to be transfused on Monday.

## 2018-10-05 NOTE — Progress Notes (Signed)
Hgb at facility 10/04/18 7.6. arranged for blood on 10/08/18. Will draw labs in infusion area per Threasa Beards, [place]

## 2018-10-05 NOTE — Telephone Encounter (Signed)
Informed nurse that patient has been scheduled to receive 2 units of blood at [facility] on 10/08/18 at 0800. Please let transportation know. She agreed to do so.

## 2018-10-08 ENCOUNTER — Inpatient Hospital Stay: Payer: Medicare Other

## 2018-10-08 ENCOUNTER — Other Ambulatory Visit: Payer: Self-pay | Admitting: Medical Oncology

## 2018-10-08 ENCOUNTER — Other Ambulatory Visit: Payer: Self-pay

## 2018-10-08 ENCOUNTER — Emergency Department (HOSPITAL_COMMUNITY): Payer: Medicare Other

## 2018-10-08 ENCOUNTER — Inpatient Hospital Stay (HOSPITAL_BASED_OUTPATIENT_CLINIC_OR_DEPARTMENT_OTHER): Payer: Medicare Other | Admitting: Medical

## 2018-10-08 ENCOUNTER — Encounter (HOSPITAL_COMMUNITY): Payer: Self-pay

## 2018-10-08 ENCOUNTER — Other Ambulatory Visit (HOSPITAL_COMMUNITY): Payer: Self-pay

## 2018-10-08 ENCOUNTER — Inpatient Hospital Stay (HOSPITAL_COMMUNITY)
Admission: EM | Admit: 2018-10-08 | Discharge: 2018-10-11 | DRG: 308 | Disposition: A | Discharge status: Skilled Nursing Facility | Payer: Medicare Other | Attending: Internal Medicine | Admitting: Internal Medicine

## 2018-10-08 DIAGNOSIS — I4891 Unspecified atrial fibrillation: Secondary | ICD-10-CM | POA: Diagnosis present

## 2018-10-08 DIAGNOSIS — I499 Cardiac arrhythmia, unspecified: Secondary | ICD-10-CM | POA: Diagnosis not present

## 2018-10-08 DIAGNOSIS — Z9221 Personal history of antineoplastic chemotherapy: Secondary | ICD-10-CM

## 2018-10-08 DIAGNOSIS — I4892 Unspecified atrial flutter: Principal | ICD-10-CM | POA: Diagnosis present

## 2018-10-08 DIAGNOSIS — D638 Anemia in other chronic diseases classified elsewhere: Secondary | ICD-10-CM | POA: Diagnosis not present

## 2018-10-08 DIAGNOSIS — I5033 Acute on chronic diastolic (congestive) heart failure: Secondary | ICD-10-CM | POA: Diagnosis present

## 2018-10-08 DIAGNOSIS — Z7901 Long term (current) use of anticoagulants: Secondary | ICD-10-CM | POA: Diagnosis not present

## 2018-10-08 DIAGNOSIS — I480 Paroxysmal atrial fibrillation: Secondary | ICD-10-CM

## 2018-10-08 DIAGNOSIS — R600 Localized edema: Secondary | ICD-10-CM | POA: Diagnosis present

## 2018-10-08 DIAGNOSIS — E876 Hypokalemia: Secondary | ICD-10-CM | POA: Diagnosis present

## 2018-10-08 DIAGNOSIS — C3491 Malignant neoplasm of unspecified part of right bronchus or lung: Secondary | ICD-10-CM | POA: Diagnosis present

## 2018-10-08 DIAGNOSIS — C3490 Malignant neoplasm of unspecified part of unspecified bronchus or lung: Secondary | ICD-10-CM | POA: Diagnosis not present

## 2018-10-08 DIAGNOSIS — D649 Anemia, unspecified: Secondary | ICD-10-CM | POA: Diagnosis not present

## 2018-10-08 DIAGNOSIS — J910 Malignant pleural effusion: Secondary | ICD-10-CM | POA: Diagnosis present

## 2018-10-08 DIAGNOSIS — R5381 Other malaise: Secondary | ICD-10-CM | POA: Diagnosis not present

## 2018-10-08 DIAGNOSIS — R5081 Fever presenting with conditions classified elsewhere: Secondary | ICD-10-CM | POA: Diagnosis not present

## 2018-10-08 DIAGNOSIS — E440 Moderate protein-calorie malnutrition: Secondary | ICD-10-CM | POA: Diagnosis not present

## 2018-10-08 DIAGNOSIS — T451X5A Adverse effect of antineoplastic and immunosuppressive drugs, initial encounter: Secondary | ICD-10-CM | POA: Diagnosis present

## 2018-10-08 DIAGNOSIS — R918 Other nonspecific abnormal finding of lung field: Secondary | ICD-10-CM | POA: Diagnosis not present

## 2018-10-08 DIAGNOSIS — Z743 Need for continuous supervision: Secondary | ICD-10-CM | POA: Diagnosis not present

## 2018-10-08 DIAGNOSIS — I5031 Acute diastolic (congestive) heart failure: Secondary | ICD-10-CM | POA: Diagnosis not present

## 2018-10-08 DIAGNOSIS — R0602 Shortness of breath: Secondary | ICD-10-CM

## 2018-10-08 DIAGNOSIS — Z79899 Other long term (current) drug therapy: Secondary | ICD-10-CM | POA: Diagnosis not present

## 2018-10-08 DIAGNOSIS — Z87442 Personal history of urinary calculi: Secondary | ICD-10-CM | POA: Diagnosis not present

## 2018-10-08 DIAGNOSIS — Z87891 Personal history of nicotine dependence: Secondary | ICD-10-CM | POA: Diagnosis not present

## 2018-10-08 DIAGNOSIS — R509 Fever, unspecified: Secondary | ICD-10-CM | POA: Diagnosis present

## 2018-10-08 DIAGNOSIS — D6481 Anemia due to antineoplastic chemotherapy: Secondary | ICD-10-CM | POA: Diagnosis present

## 2018-10-08 DIAGNOSIS — C799 Secondary malignant neoplasm of unspecified site: Secondary | ICD-10-CM | POA: Diagnosis not present

## 2018-10-08 DIAGNOSIS — Z809 Family history of malignant neoplasm, unspecified: Secondary | ICD-10-CM | POA: Diagnosis not present

## 2018-10-08 DIAGNOSIS — I509 Heart failure, unspecified: Secondary | ICD-10-CM | POA: Diagnosis not present

## 2018-10-08 DIAGNOSIS — J90 Pleural effusion, not elsewhere classified: Secondary | ICD-10-CM | POA: Diagnosis present

## 2018-10-08 DIAGNOSIS — D62 Acute posthemorrhagic anemia: Secondary | ICD-10-CM | POA: Diagnosis present

## 2018-10-08 DIAGNOSIS — Z9981 Dependence on supplemental oxygen: Secondary | ICD-10-CM

## 2018-10-08 DIAGNOSIS — R2681 Unsteadiness on feet: Secondary | ICD-10-CM | POA: Diagnosis not present

## 2018-10-08 DIAGNOSIS — Z4589 Encounter for adjustment and management of other implanted devices: Secondary | ICD-10-CM | POA: Diagnosis not present

## 2018-10-08 DIAGNOSIS — T85698A Other mechanical complication of other specified internal prosthetic devices, implants and grafts, initial encounter: Secondary | ICD-10-CM | POA: Diagnosis not present

## 2018-10-08 DIAGNOSIS — R279 Unspecified lack of coordination: Secondary | ICD-10-CM | POA: Diagnosis not present

## 2018-10-08 DIAGNOSIS — R278 Other lack of coordination: Secondary | ICD-10-CM | POA: Diagnosis not present

## 2018-10-08 HISTORY — DX: Anemia in other chronic diseases classified elsewhere: D63.8

## 2018-10-08 HISTORY — DX: Unspecified atrial flutter: I48.92

## 2018-10-08 LAB — CBC
HCT: 27.5 % — ABNORMAL LOW (ref 39.0–52.0)
HEMOGLOBIN: 8.1 g/dL — AB (ref 13.0–17.0)
MCH: 26.7 pg (ref 26.0–34.0)
MCHC: 29.5 g/dL — AB (ref 30.0–36.0)
MCV: 90.8 fL (ref 80.0–100.0)
Platelets: 390 10*3/uL (ref 150–400)
RBC: 3.03 MIL/uL — ABNORMAL LOW (ref 4.22–5.81)
RDW: 17 % — AB (ref 11.5–15.5)
WBC: 8.3 10*3/uL (ref 4.0–10.5)
nRBC: 0 % (ref 0.0–0.2)

## 2018-10-08 LAB — CBC WITH DIFFERENTIAL (CANCER CENTER ONLY)
ABS IMMATURE GRANULOCYTES: 0.05 10*3/uL (ref 0.00–0.07)
Basophils Absolute: 0 10*3/uL (ref 0.0–0.1)
Basophils Relative: 0 %
Eosinophils Absolute: 0.1 10*3/uL (ref 0.0–0.5)
Eosinophils Relative: 1 %
HCT: 23.8 % — ABNORMAL LOW (ref 39.0–52.0)
Hemoglobin: 7.2 g/dL — ABNORMAL LOW (ref 13.0–17.0)
Immature Granulocytes: 1 %
LYMPHS ABS: 0.4 10*3/uL — AB (ref 0.7–4.0)
Lymphocytes Relative: 5 %
MCH: 26.6 pg (ref 26.0–34.0)
MCHC: 30.3 g/dL (ref 30.0–36.0)
MCV: 87.8 fL (ref 80.0–100.0)
Monocytes Absolute: 1.3 10*3/uL — ABNORMAL HIGH (ref 0.1–1.0)
Monocytes Relative: 16 %
NRBC: 0 % (ref 0.0–0.2)
Neutro Abs: 6.2 10*3/uL (ref 1.7–7.7)
Neutrophils Relative %: 77 %
Platelet Count: 396 10*3/uL (ref 150–400)
RBC: 2.71 MIL/uL — ABNORMAL LOW (ref 4.22–5.81)
RDW: 17.3 % — ABNORMAL HIGH (ref 11.5–15.5)
WBC Count: 8.1 10*3/uL (ref 4.0–10.5)

## 2018-10-08 LAB — MAGNESIUM: Magnesium: 1.7 mg/dL (ref 1.7–2.4)

## 2018-10-08 LAB — PREPARE RBC (CROSSMATCH)

## 2018-10-08 LAB — SAMPLE TO BLOOD BANK

## 2018-10-08 LAB — BASIC METABOLIC PANEL
Anion gap: 10 (ref 5–15)
BUN: 10 mg/dL (ref 8–23)
CHLORIDE: 97 mmol/L — AB (ref 98–111)
CO2: 27 mmol/L (ref 22–32)
CREATININE: 0.56 mg/dL — AB (ref 0.61–1.24)
Calcium: 7.8 mg/dL — ABNORMAL LOW (ref 8.9–10.3)
GFR calc Af Amer: >60 mL/min (ref >60)
GFR calc non Af Amer: >60 mL/min (ref >60)
Glucose, Bld: 93 mg/dL (ref 70–99)
Potassium: 3.6 mmol/L (ref 3.5–5.1)
Sodium: 134 mmol/L — ABNORMAL LOW (ref 135–145)

## 2018-10-08 LAB — MRSA PCR SCREENING: MRSA by PCR: NEGATIVE

## 2018-10-08 LAB — TSH: TSH: 1.509 u[IU]/mL (ref 0.350–4.500)

## 2018-10-08 LAB — ABO/RH: ABO/RH(D): O POS

## 2018-10-08 MED ORDER — APIXABAN 5 MG PO TABS
5.0000 mg | ORAL_TABLET | Freq: Two times a day (BID) | ORAL | Status: DC
  Administered 2018-10-08 – 2018-10-09 (×2): 5 mg via ORAL
  Filled 2018-10-08 (×3): qty 1

## 2018-10-08 MED ORDER — ADENOSINE 6 MG/2ML IV SOLN
6.0000 mg | Freq: Once | INTRAVENOUS | Status: AC
  Administered 2018-10-08: 6 mg via INTRAVENOUS
  Filled 2018-10-08: qty 2

## 2018-10-08 MED ORDER — AMIODARONE HCL IN DEXTROSE 360-4.14 MG/200ML-% IV SOLN: 30.0000 mg/h | INTRAVENOUS | Status: DC

## 2018-10-08 MED ORDER — TRAMADOL HCL 50 MG PO TABS
50.0000 mg | ORAL_TABLET | Freq: Four times a day (QID) | ORAL | Status: DC | PRN
  Administered 2018-10-09: 50 mg via ORAL
  Filled 2018-10-08: qty 1

## 2018-10-08 MED ORDER — AMIODARONE HCL IN DEXTROSE 360-4.14 MG/200ML-% IV SOLN
60.0000 mg/h | INTRAVENOUS | Status: DC
  Filled 2018-10-08: qty 200

## 2018-10-08 MED ORDER — ALBUTEROL SULFATE (2.5 MG/3ML) 0.083% IN NEBU: 2.5000 mg | INHALATION_SOLUTION | RESPIRATORY_TRACT | Status: DC | PRN

## 2018-10-08 MED ORDER — ONDANSETRON HCL 4 MG PO TABS: 4.0000 mg | ORAL_TABLET | Freq: Four times a day (QID) | ORAL | Status: DC | PRN

## 2018-10-08 MED ORDER — DILTIAZEM HCL 100 MG IV SOLR
5.0000 mg/h | INTRAVENOUS | Status: DC
  Administered 2018-10-08: 5 mg/h via INTRAVENOUS
  Administered 2018-10-08: 12.5 mg/h via INTRAVENOUS
  Filled 2018-10-08 (×2): qty 100

## 2018-10-08 MED ORDER — SODIUM CHLORIDE 0.9% FLUSH
3.0000 mL | Freq: Two times a day (BID) | INTRAVENOUS | Status: DC
  Administered 2018-10-08 – 2018-10-11 (×6): 3 mL via INTRAVENOUS

## 2018-10-08 MED ORDER — SODIUM CHLORIDE 0.9% FLUSH: 3.0000 mL | INTRAVENOUS | Status: DC | PRN

## 2018-10-08 MED ORDER — GUAIFENESIN ER 600 MG PO TB12
600.0000 mg | ORAL_TABLET | Freq: Two times a day (BID) | ORAL | Status: DC
  Administered 2018-10-08: 600 mg via ORAL
  Filled 2018-10-08: qty 1

## 2018-10-08 MED ORDER — DOCUSATE SODIUM 100 MG PO CAPS
100.0000 mg | ORAL_CAPSULE | Freq: Two times a day (BID) | ORAL | Status: DC
  Administered 2018-10-08 – 2018-10-09 (×2): 100 mg via ORAL
  Filled 2018-10-08 (×5): qty 1

## 2018-10-08 MED ORDER — SODIUM CHLORIDE 0.9% IV SOLUTION
250.0000 mL | Freq: Once | INTRAVENOUS | Status: AC
  Administered 2018-10-08: 250 mL via INTRAVENOUS
  Filled 2018-10-08: qty 250

## 2018-10-08 MED ORDER — ACETAMINOPHEN 325 MG PO TABS
650.0000 mg | ORAL_TABLET | Freq: Four times a day (QID) | ORAL | Status: DC | PRN
  Administered 2018-10-09: 650 mg via ORAL
  Filled 2018-10-08: qty 2

## 2018-10-08 MED ORDER — DILTIAZEM LOAD VIA INFUSION
15.0000 mg | Freq: Once | INTRAVENOUS | Status: AC
  Administered 2018-10-08: 15 mg via INTRAVENOUS
  Filled 2018-10-08: qty 15

## 2018-10-08 MED ORDER — AMIODARONE LOAD VIA INFUSION
150.0000 mg | Freq: Once | INTRAVENOUS | Status: DC
  Filled 2018-10-08: qty 83.34

## 2018-10-08 MED ORDER — FOLIC ACID 1 MG PO TABS
1.0000 mg | ORAL_TABLET | Freq: Every day | ORAL | Status: DC
  Administered 2018-10-08 – 2018-10-11 (×4): 1 mg via ORAL
  Filled 2018-10-08 (×4): qty 1

## 2018-10-08 MED ORDER — ONDANSETRON HCL 4 MG/2ML IJ SOLN: 4.0000 mg | Freq: Four times a day (QID) | INTRAMUSCULAR | Status: DC | PRN

## 2018-10-08 MED ORDER — ADULT MULTIVITAMIN W/MINERALS CH
1.0000 | ORAL_TABLET | Freq: Every day | ORAL | Status: DC
  Administered 2018-10-08 – 2018-10-11 (×4): 1 via ORAL
  Filled 2018-10-08 (×4): qty 1

## 2018-10-08 MED ORDER — SODIUM CHLORIDE 0.9 % IV SOLN: 250.0000 mL | INTRAVENOUS | Status: DC | PRN

## 2018-10-08 MED ORDER — POLYETHYLENE GLYCOL 3350 17 G PO PACK: 17.0000 g | PACK | Freq: Every day | ORAL | Status: DC | PRN

## 2018-10-08 MED ORDER — ACETAMINOPHEN 650 MG RE SUPP: 650.0000 mg | Freq: Four times a day (QID) | RECTAL | Status: DC | PRN

## 2018-10-08 NOTE — Progress Notes (Signed)
PHARMACY NOTE -  Afib medications  Pharmacy has been assisting with dosing of Eliquis for new Afib.  Dosage remains stable at 5 mg PO bid and need for further dosage adjustment appears unlikely at present given stable renal function  Anemia d/t chemo/malignancy, Hgb improved with PRBC x 1; no reported bleeding  Pharmacy will sign off, following peripherally for changes in clinical status.  Additionally, Pharmacy asked to evaluate potential interactions with amiodarone. Amiodarone was discontinued, however, when patient converted back into NSR on dilt gtt and adenocard x 1.    Patient does not have any medications ordered either inpatient or prior to admission that would interact with amiodarone, should it need to be restarted. Pharmacy will continue to monitor for any changes in medications that would warrant consideration for interaction.   Reuel Boom, PharmD, BCPS [phone] 10/08/2018, 6:03 PM

## 2018-10-08 NOTE — ED Notes (Signed)
RN assisted patient in using the urinal.

## 2018-10-08 NOTE — ED Notes (Signed)
Patient found to be in Sinus tachycardia rhythm when this RN went to administer IV amiodarone. Cardiologist messaged and amio drip discontinued. Floor RN made aware.

## 2018-10-08 NOTE — ED Notes (Signed)
Bed: EJ61 Expected date:  Expected time:  Means of arrival:  Comments: Hold per charge

## 2018-10-08 NOTE — ED Notes (Signed)
Patient has a unit of blood going at this time. Patient's blood was reportedly started at 1105 today due to a low Hgb. Patient reports he has been having difficulty with feeling tired and SOB with any exertion that has worsened over the past 4 weeks. Patient is alert and oriented at this time. Patient denies pain at this time.

## 2018-10-08 NOTE — ED Triage Notes (Addendum)
Pt arrives from the [facility] with new onset A fib RVR. Pt is currently receiving first unit of blood. Pts hgb 7.2. Pt has dx of lung cancer. While pt was receiving first unit pts HR increased to 170bpm. Per oncology PA: Not related to transfusion, continue blood.

## 2018-10-08 NOTE — Progress Notes (Signed)
Obtained Pt's VS at 15 min after start of blood transfusion. Pulse noted to be ranging from 160-170. Pt denied any distress. Blood transfusion paused and V Tanner PA notified. EKG ordered. Shelia Media @ bedside to read EKG.   Blood transfusion restarted per v.o. from V. Tanner PA. Pt to be transferred to ER for further evaluation of heart rhythm.  Upon transferring to wheelchair, pt became short of breath. Pt felt better after resting in chair for a few minutes. Pt transferred to ER via wheelchair.  Shelia Media remained w/ patient during transfer

## 2018-10-08 NOTE — ED Notes (Signed)
ED TO INPATIENT HANDOFF REPORT  Name/Age/Gender Brian Ramirez 73 y.o. male  Code Status Code Status History    Date Active Date Inactive Code Status Order ID Comments User Context   09/19/2018 1436 09/25/2018 2038 Full Code [id number]  Caren Griffins, MD Inpatient   09/04/2018 1751 09/13/2018 2007 Full Code [id number]  Donne Hazel, MD ED    Advance Directive Documentation     Most Recent Value  Type of Advance Directive  Healthcare Power of Attorney, Living will  Pre-existing out of facility DNR order (yellow form or pink MOST form)  -  "MOST" Form in Place?  -      Home/SNF/Other [facility]   Chief Complaint shortness of breath, high heart rate  Level of Care/Admitting Diagnosis ED Disposition    ED Disposition Condition [place]: [facility] [[id number]]  Level of Care: Stepdown [14]  Admit to SDU based on following criteria: Cardiac Instability:  Patients experiencing chest pain, unconfirmed MI and stable, arrhythmias and CHF requiring medical management and potentially compromising patient's stability  Diagnosis: Atrial flutter with rapid ventricular response [facility]) [[id number]]  Admitting Physician: Bonnielee Haff [[id number]]  Attending Physician: Bonnielee Haff [[id number]]  Estimated length of stay: past midnight tomorrow  Certification:: I certify this patient will need inpatient services for at least 2 midnights  PT Class (Do Not Modify): Inpatient [101]  PT Acc Code (Do Not Modify): Private [1]       Medical History Past Medical History:  Diagnosis Date  . Hernia 2012  . History of hiatal hernia   . History of kidney stones   . Personal history of kidney stones 1992    Allergies No Known Allergies  IV Location/Drains/Wounds Patient Lines/Drains/Airways Status   Active Line/Drains/Airways    Name:   Placement date:   Placement time:   Site:   Days:   Peripheral IV 10/08/18 Left Forearm   10/08/18    0930    Forearm    less than 1   Peripheral IV 10/08/18 Right Hand   10/08/18    1212    Hand   less than 1   Peripheral IV 10/08/18 Left Antecubital   10/08/18    1646    Antecubital   less than 1   Chest Tube 1 Right Pleural 15.5 Fr.   09/11/18    1701    Pleural   27   Incision (Closed) 11/22/16 Abdomen   11/22/16    0852     685   Incision - 3 Ports Abdomen Umbilicus Right;Mid Left;Mid   11/22/16    0848     685   Pressure Injury 09/19/18 Stage I -  Intact skin with non-blanchable redness of a localized area usually over a bony prominence. reddened area, non blanchable   09/19/18    1500     19   Wound / Incision (Open or Dehisced) 09/07/18 Puncture Back Right puncture site from biopsy and thoracentesis   09/07/18    1700    Back   31          Labs/Imaging Results for orders placed or performed during the hospital encounter of 10/08/18 (from the past 48 hour(s))  Basic metabolic panel     Status: Abnormal   Collection Time: 10/08/18 12:37 PM  Result Value Ref Range   Sodium 134 (L) 135 - 145 mmol/L   Potassium 3.6 3.5 - 5.1 mmol/L   Chloride 97 (L)  98 - 111 mmol/L   CO2 27 22 - 32 mmol/L   Glucose, Bld 93 70 - 99 mg/dL   BUN 10 8 - 23 mg/dL   Creatinine, Ser 0.56 (L) 0.61 - 1.24 mg/dL   Calcium 7.8 (L) 8.9 - 10.3 mg/dL   GFR calc non Af Amer >60 >60 mL/min   GFR calc Af Amer >60 >60 mL/min   Anion gap 10 5 - 15    Comment: Performed at [facility], [place] [address], [place], [place] [address]  Magnesium     Status: None   Collection Time: 10/08/18 12:37 PM  Result Value Ref Range   Magnesium 1.7 1.7 - 2.4 mg/dL    Comment: Performed at [facility], [place] [address], [place], [place] [address]  CBC     Status: Abnormal   Collection Time: 10/08/18 12:37 PM  Result Value Ref Range   WBC 8.3 4.0 - 10.5 K/uL   RBC 3.03 (L) 4.22 - 5.81 MIL/uL   Hemoglobin 8.1 (L) 13.0 - 17.0 g/dL   HCT 27.5 (L) 39.0 - 52.0 %   MCV 90.8 80.0 - 100.0 fL   MCH 26.7 26.0 - 34.0  pg   MCHC 29.5 (L) 30.0 - 36.0 g/dL   RDW 17.0 (H) 11.5 - 15.5 %   Platelets 390 150 - 400 K/uL   nRBC 0.0 0.0 - 0.2 %    Comment: Performed at [facility], [place] [address] [place], [place] [address]  TSH     Status: None   Collection Time: 10/08/18 12:37 PM  Result Value Ref Range   TSH 1.509 0.350 - 4.500 uIU/mL    Comment: Performed by a 3rd Generation assay with a functional sensitivity of <=0.01 uIU/mL. Performed at [facility], [place] [address], [place], [place] [address]    Dg Chest Port 1 View  Result Date: 10/08/2018 CLINICAL DATA:  Shortness of breath EXAM: PORTABLE CHEST 1 VIEW COMPARISON:  09/19/2010 FINDINGS: There is a large right pleural effusion. There is bilateral interstitial thickening. There is no left pleural effusion. There is no pneumothorax. There is a right apical pulmonary mass. The heart size is stable. There is no acute osseous abnormality. IMPRESSION: 1. Large right pleural effusion. Persistent right apical pulmonary mass. 2. Bilateral diffuse interstitial thickening which may reflect mild interstitial edema. Electronically Signed   By: Kathreen Devoid   On: 10/08/2018 12:41   EKG Interpretation  Date/Time:  Monday October 08 2018 14:34:11 EST Ventricular Rate:  156 PR Interval:    QRS Duration: 81 QT Interval:  281 QTC Calculation: 453 R Axis:   69 Text Interpretation:  Sinus node reentrant tachycardia nonspecific st/ts simus replacing afib earlier today Confirmed by Aletta Edouard [phone]) on 10/08/2018 2:42:18 PM   Pending Labs Unresulted Labs (From admission, onward)   None      Vitals/Pain Today's Vitals   10/08/18 1630 10/08/18 1645 10/08/18 1700 10/08/18 1715  BP: 120/65 97/63 117/69 120/66  Pulse: (!) 156 (!) 158 (!) 160 (!) 158  Resp: (!) 36 (!) 22 (!) 27 (!) 36  Temp:      TempSrc:      SpO2: 93% 94% 93% 95%  Weight:      Height:      PainSc:        Isolation Precautions No active  isolations  Medications Medications  diltiazem (CARDIZEM) 1 mg/mL load via infusion 15 mg (15 mg Intravenous Bolus from Bag  10/08/18 1222)    And  diltiazem (CARDIZEM) 100 mg in dextrose 5 % 100 mL (1 mg/mL) infusion (12.5 mg/hr Intravenous Rate/Dose Change 10/08/18 1331)  amiodarone (NEXTERONE) 1.8 mg/mL load via infusion 150 mg (has no administration in time range)  amiodarone (NEXTERONE PREMIX) 360-4.14 MG/200ML-% (1.8 mg/mL) IV infusion (has no administration in time range)    Followed by  amiodarone (NEXTERONE PREMIX) 360-4.14 MG/200ML-% (1.8 mg/mL) IV infusion (has no administration in time range)  adenosine (ADENOCARD) 6 MG/2ML injection 6 mg (6 mg Intravenous Given 10/08/18 1646)    Mobility non-ambulatory

## 2018-10-08 NOTE — H&P (Signed)
[facility] History and Physical  Brian Ramirez IRJ:[id number] DOB: 03/18/1946 DOA: 10/08/2018   PCP: [name], MD  Specialists: Dr. [name] is his oncologist  Chief Complaint: Elevated heart rate  HPI: Brian Ramirez is a 73 y.o. male recently diagnosed with metastatic non-small small cell lung cancer, received first dose of chemotherapy when he was in the hospital recently, has a large right malignant pleural effusion with bleeding, status post right Pleurx catheter placement who was discharged to skilled nursing facility from the hospital.  Also has anemia of chronic disease and moderate protein calorie malnutrition.  Patient was seen at the cancer center today for blood transfusion.  While he was getting blood transfusion he was noted to have a heart rate in the 150s.  He was sent over to the emergency department.  EKG initially suggested atrial fibrillation.  There was no response with Cardizem infusion.  So adenosine was given and rhythm strips then showed flutter waves.  So it appears that he has atrial flutter.  No previous history of same.  Patient denied any chest pain shortness of breath nausea vomiting.  No fever chills.  No sick contacts recently although he is in a skilled nursing facility.  Cardiology was consulted.  Patient's heart rate did not improve with Cardizem infusion.  Amiodarone has been ordered.  Patient will need hospitalization for further management.  Home Medications: Prior to Admission medications   Medication Sig Start Date End Date Taking? Authorizing Provider  docusate sodium (COLACE) 100 MG capsule Take 100 mg by mouth 2 (two) times daily.   Yes [provider]  folic acid (FOLVITE) 1 MG tablet Take 1 tablet (1 mg total) by mouth daily for 30 days. 09/25/18 10/25/18 Yes Amin, Ankit Chirag, MD  guaiFENesin (MUCINEX PO) Take 1 tablet by mouth 2 (two) times daily.   Yes [provider]  magic mouthwash SOLN Take 5 mLs by mouth 2  (two) times daily.   Yes [provider]  Multiple Vitamin (MULTIVITAMIN WITH MINERALS) TABS tablet Take 1 tablet by mouth daily. CertaVite   Yes [provider]  Nutritional Supplements (NUTRITIONAL DRINK PO) Take 1 application by mouth 3 (three) times daily.   Yes [provider]  traMADol (ULTRAM) 50 MG tablet Take by mouth every 6 (six) hours as needed for moderate pain.   Yes [provider]  polyethylene glycol (MIRALAX / GLYCOLAX) packet Take 17 g by mouth daily as needed for moderate constipation or severe constipation (use first). Patient taking differently: Take 17 g by mouth daily as needed for moderate constipation or severe constipation (use first). constipation 09/25/18   Amin, Jeanella Flattery, MD    Allergies: No Known Allergies  Past Medical History: Past Medical History:  Diagnosis Date  . Hernia 2012  . History of hiatal hernia   . History of kidney stones   . Personal history of kidney stones 1992    Past Surgical History:  Procedure Laterality Date  . HERNIA REPAIR  2012   inguinal  . INGUINAL HERNIA REPAIR Left 11/22/2016   Procedure: LAPAROSCOPIC  REPAIR OF LEFT INGUINAL HERNIA WITH MESH;  Surgeon: [name], MD;  Location: [facility];  Service: General;  Laterality: Left;  . IR PERC PLEURAL DRAIN W/INDWELL CATH W/IMG GUIDE  09/11/2018    Social History: Patient is currently at a skilled nursing facility.  Not smoking currently.  No alcohol use.   Family History:  Family History  Problem Relation Age of Onset  .  Cancer Brother      Review of Systems - History obtained from the patient General ROS: positive for  - fatigue Psychological ROS: negative Ophthalmic ROS: negative ENT ROS: negative Allergy and Immunology ROS: negative Hematological and Lymphatic ROS: negative Endocrine ROS: negative Respiratory ROS: no cough, shortness of breath, or wheezing Cardiovascular ROS: as in hpi Gastrointestinal ROS: no abdominal pain,  change in bowel habits, or black or bloody stools Genito-Urinary ROS: no dysuria, trouble voiding, or hematuria Musculoskeletal ROS: negative Neurological ROS: no TIA or stroke symptoms Dermatological ROS: negative  Physical Examination  Vitals:   10/08/18 1630 10/08/18 1645 10/08/18 1700 10/08/18 1715  BP: 120/65 97/63 117/69 120/66  Pulse: (!) 156 (!) 158 (!) 160 (!) 158  Resp: (!) 36 (!) 22 (!) 27 (!) 36  Temp:      TempSrc:      SpO2: 93% 94% 93% 95%  Weight:      Height:        BP 120/66   Pulse (!) 158   Temp 100.3 F (37.9 C) (Oral)   Resp (!) 36   Ht 6\' 1"  (1.854 m)   Wt 72.3 kg   SpO2 95%   BMI 21.03 kg/m   General appearance: alert, cooperative, appears stated age and no distress Head: Normocephalic, without obvious abnormality, atraumatic Eyes: conjunctivae/corneas clear. PERRL, EOM's intact.  Throat: lips, mucosa, and tongue normal; teeth and gums normal Neck: no adenopathy, no carotid bruit, no JVD, supple, symmetrical, trachea midline and thyroid not enlarged, symmetric, no tenderness/mass/nodules Resp: Normal effort at rest.  Diminished air entry on the right with dullness to percussion.  No crackles.  No wheezing. Cardio: S1-S2 is tachycardic irregular.  No obvious murmurs appreciated GI: soft, non-tender; bowel sounds normal; no masses,  no organomegaly and Pleurx catheter noted over the right abdomen Extremities: extremities normal, atraumatic, no cyanosis or edema Pulses: 2+ and symmetric Skin: Skin color, texture, turgor normal. No rashes or lesions Lymph nodes: Cervical, supraclavicular, and axillary nodes normal. Neurologic: No focal neurological deficits.   Labs on Admission: I have personally reviewed following labs and imaging studies  CBC: Recent Labs  Lab 10/08/18 0847 10/08/18 1237  WBC 8.1 8.3  NEUTROABS 6.2  --   HGB 7.2* 8.1*  HCT 23.8* 27.5*  MCV 87.8 90.8  PLT 396 093   Basic Metabolic Panel: Recent Labs  Lab  10/08/18 1237  NA 134*  K 3.6  CL 97*  CO2 27  GLUCOSE 93  BUN 10  CREATININE 0.56*  CALCIUM 7.8*  MG 1.7   GFR: Estimated Creatinine Clearance: 86.6 mL/min (A) (by C-G formula based on SCr of 0.56 mg/dL (L)).  Thyroid Function Tests: Recent Labs    10/08/18 1237  TSH 1.509    Radiological Exams on Admission: Dg Chest Port 1 View  Result Date: 10/08/2018 CLINICAL DATA:  Shortness of breath EXAM: PORTABLE CHEST 1 VIEW COMPARISON:  09/19/2010 FINDINGS: There is a large right pleural effusion. There is bilateral interstitial thickening. There is no left pleural effusion. There is no pneumothorax. There is a right apical pulmonary mass. The heart size is stable. There is no acute osseous abnormality. IMPRESSION: 1. Large right pleural effusion. Persistent right apical pulmonary mass. 2. Bilateral diffuse interstitial thickening which may reflect mild interstitial edema. Electronically Signed   By: Kathreen Devoid   On: 10/08/2018 12:41    My interpretation of Electrocardiogram: Initial EKG suggested atrial fibrillation.  Rhythm strips showed atrial flutter.   Problem List  Principal Problem:   Atrial flutter with rapid ventricular response (HCC) Active Problems:   Pleural effusion   Lung cancer (HCC)   Anemia of chronic disease   Assessment: This is a 73 year old Caucasian male with a past medical history as stated earlier, most significant for recently diagnosed metastatic non-small cell lung cancer who was noted to be tachycardic and is found to have atrial flutter.  Plan:  1. Atrial flutter with RVR: Cardiology has placed orders for amiodarone infusion because the heart rate did not show any improvement with Cardizem.  Patient will be admitted to stepdown unit.  Cardiology wants patient on anticoagulation.  This was discussed with Dr. [name].  He does have a history of bleeding into the right pleural space and has a right pleural effusion.  He has required blood  transfusion. However due to the risks of stroke he will be okay with the patient receiving Eliquis.  Discussed with patient as well who agrees to this.  Eliquis has been ordered.  Cardiology will see the patient tomorrow.  TSH is normal.  Echocardiogram will be ordered.  2.  Metastatic non-small cell lung cancer: Management per oncology.  He is received 1 cycle of chemotherapy when he was in the hospital recently.  3.  Anemia of chronic disease as well as a component of blood loss: He has had bleeding into the right pleural space.  Monitor hemoglobin closely.  He was transfused 1 unit of PRBC today.  4.  Malignant right pleural effusion status post Pleurx catheter: Catheter to be drained every day.  Patient informed me that they had some problems draining the catheter yesterday at SNF.  X-ray does show right-sided pleural effusion.  If the catheter does not drain then may have to involve IR to look into it.  DVT Prophylaxis: Eliquis has been initiated Code Status: Full code Family Communication: Discussed with the patient Disposition: Back to skilled nursing facility when improved Consults called: Cardiology will see patient tomorrow.  Discussed with Dr. [name] who will see him tomorrow Admission Status: Inpatient  Severity of Illness: The appropriate patient status for this patient is INPATIENT. Inpatient status is judged to be reasonable and necessary in order to provide the required intensity of service to ensure the patient's safety. The patient's presenting symptoms, physical exam findings, and initial radiographic and laboratory data in the context of their chronic comorbidities is felt to place them at high risk for further clinical deterioration. Furthermore, it is not anticipated that the patient will be medically stable for discharge from the hospital within 2 midnights of admission. The following factors support the patient status of inpatient.   " The patient's presenting symptoms  include palpitations. " The worrisome physical exam findings include tachycardia. " The initial radiographic and laboratory data are worrisome because of anemia. " The chronic co-morbidities include lung cancer.   * I certify that at the point of admission it is my clinical judgment that the patient will require inpatient hospital care spanning beyond 2 midnights from the point of admission due to high intensity of service, high risk for further deterioration and high frequency of surveillance required.*  Further management decisions will depend on results of further testing and patient's response to treatment.   Keyanni Whittinghill [employer]  [facility] operational officer on [email or web address]  10/08/2018, 5:34 PM

## 2018-10-08 NOTE — Progress Notes (Signed)
[facility] Progress Note   Brian Ramirez [id number] 27-Apr-1946 73 y.o.  Brian Ramirez is managed by Dr. Dominica Severin B. Sherrill  Actively treated with chemotherapy/immunotherapy/hormonal therapy: yes  Current therapy: Keytruda  Last treated: 09/28/2018 (cycle 1, day 1)  Next scheduled appointment with provider: 10/15/2018  Assessment: Plan:    Malignant neoplasm of right lung, unspecified part of lung (HCC)  Anemia, unspecified type  Atrial fibrillation with rapid ventricular response ([place])   Metastatic non-small cell lung cancer: The patient continues to be followed by Dr. Dominica Severin B. Sherrill and is status post cycle 1, day 1 of Keytruda which was dosed on 09/28/2018.  He is scheduled to see Dr. [name] in follow-up on 10/15/2018.  Anemia: The patient had a CBC completed today which returned with a hemoglobin of 7.2.  He was scheduled to receive 2 units of packed red blood cells today.  Atrial fibrillation with rapid ventricular response: An EKG was completed while the patient was receiving his first unit of packed red blood cells today.  The EKG returned showing atrial fibrillation with a rapid ventricular response with a heart rate of 175.  This was discussed with Dr. [name]. The patient was taken to the emergency room for evaluation and management.  The patient's baseline vital signs from today returned showing a blood pressure of 135/67, pulse of 110, respirations of 18 and a temperature of 98.7.  Please see After Visit Summary for patient specific instructions.  Future Appointments  Date Time Provider [place]  10/15/2018  9:15 AM [facility] LAB 2 [facility] None  10/15/2018  9:30 AM [facility] [place] FLUSH [facility] None  10/15/2018 10:00 AM Ladell Pier, MD [facility] None  10/15/2018 10:45 AM [facility] INFUSION [facility] None    No orders of the defined types were placed in this encounter.      Subjective:   Patient ID:  [name] is a 73 y.o. (DOB 1946/08/15) male.  Chief Complaint: No chief complaint on file.   HPI Brian Ramirez is a 73 year old male with a history of a metastatic non-small cell lung cancer who is followed by Dr. Dominica Severin B. Sherrill.  He is status post cycle 1, day 1 of Keytruda which was dosed on 09/28/2018.  The patient had a CBC completed today which returned with a hemoglobin of 7.2.  He was scheduled to receive 2 units of packed red blood cells today.  He was seen in the infusion room today when he was receiving his first unit of packed red blood cells.  This provider was called to the patient's bedside as the patient was noted to have a pulse rate that ranged from 1 30-1 50.  An EKG was completed which showed atrial fibrillation with a rapid ventricular response with a heart rate of 175.  His baseline vital signs from today returned showing a blood pressure of 135/67, pulse of 110, respirations of 18 and a temperature of 98.7.  The patient was dizzy and had an increase in shortness of breath over his baseline.  Medications: I have reviewed the patient's current medications.  Allergies: No Known Allergies  Past Medical History:  Diagnosis Date  . Hernia 2012  . History of hiatal hernia   . History of kidney stones   . Personal history of kidney stones 1992    Past Surgical History:  Procedure Laterality Date  . HERNIA REPAIR  2012   inguinal  . INGUINAL HERNIA REPAIR Left 11/22/2016   Procedure:  LAPAROSCOPIC  REPAIR OF LEFT INGUINAL HERNIA WITH MESH;  Surgeon: [name], MD;  Location: [facility];  Service: General;  Laterality: Left;  . IR PERC PLEURAL DRAIN W/INDWELL CATH W/IMG GUIDE  09/11/2018    No family history on file.  Social History   Socioeconomic History  . Marital status: Single    Spouse name: Not on file  . Number of children: Not on file  . Years of education: Not on file  . Highest education level: Not on file  Occupational History  . Not on file  Social  Needs  . Financial resource strain: Not on file  . Food insecurity:    Worry: Not on file    Inability: Not on file  . Transportation needs:    Medical: Not on file    Non-medical: Not on file  Tobacco Use  . Smoking status: Never Smoker  . Smokeless tobacco: Never Used  Substance and Sexual Activity  . Alcohol use: Yes    Alcohol/week: 1.0 standard drinks    Types: 1 Glasses of wine per week  . Drug use: No  . Sexual activity: Not on file  Lifestyle  . Physical activity:    Days per week: Not on file    Minutes per session: Not on file  . Stress: Not on file  Relationships  . Social connections:    Talks on phone: Not on file    Gets together: Not on file    Attends religious service: Not on file    Active member of club or organization: Not on file    Attends meetings of clubs or organizations: Not on file    Relationship status: Not on file  . Intimate partner violence:    Fear of current or ex partner: Not on file    Emotionally abused: Not on file    Physically abused: Not on file    Forced sexual activity: Not on file  Other Topics Concern  . Not on file  Social History Narrative  . Not on file    Past Medical History, Surgical history, Social history, and Family history were reviewed and updated as appropriate.   Please see review of systems for further details on the patient's review from today.   Review of Systems:  Review of Systems  Constitutional: Negative for appetite change, chills, diaphoresis and fever.  HENT: Negative for dental problem, mouth sores and trouble swallowing.   Respiratory: Negative for cough, chest tightness and shortness of breath.   Cardiovascular: Positive for palpitations. Negative for chest pain.  Gastrointestinal: Negative for constipation, diarrhea, nausea and vomiting.  Neurological: Positive for dizziness and weakness. Negative for syncope and headaches.    Objective:   Physical Exam:  There were no vitals taken for  this visit. ECOG: 1  Physical Exam Constitutional:      General: He is not in acute distress.    Appearance: He is not diaphoretic.  HENT:     Head: Normocephalic and atraumatic.  Cardiovascular:     Rate and Rhythm: Tachycardia present. Rhythm irregular.     Heart sounds: Normal heart sounds. No murmur. No friction rub. No gallop.   Pulmonary:     Effort: Pulmonary effort is normal. No respiratory distress.     Breath sounds: Normal breath sounds. No wheezing or rales.  Skin:    General: Skin is warm and dry.     Findings: No erythema or rash.  Neurological:     Mental Status: He  is alert.     Motor: Weakness present.     Lab Review:     Component Value Date/Time   NA 134 (L) 10/08/2018 1237   K 3.6 10/08/2018 1237   CL 97 (L) 10/08/2018 1237   CO2 27 10/08/2018 1237   GLUCOSE 93 10/08/2018 1237   BUN 10 10/08/2018 1237   CREATININE 0.56 (L) 10/08/2018 1237   CREATININE 0.60 (L) 09/28/2018 1410   CALCIUM 7.8 (L) 10/08/2018 1237   PROT 5.7 (L) 09/28/2018 1410   ALBUMIN 1.4 (L) 09/28/2018 1410   AST 49 (H) 09/28/2018 1410   ALT 61 (H) 09/28/2018 1410   ALKPHOS 128 (H) 09/28/2018 1410   BILITOT 0.8 09/28/2018 1410   GFRNONAA >60 10/08/2018 1237   GFRNONAA >60 09/28/2018 1410   GFRAA >60 10/08/2018 1237   GFRAA >60 09/28/2018 1410       Component Value Date/Time   WBC 8.3 10/08/2018 1237   RBC 3.03 (L) 10/08/2018 1237   HGB 8.1 (L) 10/08/2018 1237   HGB 7.2 (L) 10/08/2018 0847   HCT 27.5 (L) 10/08/2018 1237   PLT 390 10/08/2018 1237   PLT 396 10/08/2018 0847   MCV 90.8 10/08/2018 1237   MCH 26.7 10/08/2018 1237   MCHC 29.5 (L) 10/08/2018 1237   RDW 17.0 (H) 10/08/2018 1237   LYMPHSABS 0.4 (L) 10/08/2018 0847   MONOABS 1.3 (H) 10/08/2018 0847   EOSABS 0.1 10/08/2018 0847   BASOSABS 0.0 10/08/2018 0847   -------------------------------  Imaging from last 24 hours (if applicable):  Radiology interpretation: Dg Chest 2 View  Result Date:  09/19/2018 CLINICAL DATA:  Dyspnea. History of lung cancer with recurrent RIGHT pleural effusion, PleurX catheter placed 1 week ago. EXAM: CHEST - 2 VIEW COMPARISON:  Chest radiograph September 11, 2018 FINDINGS: Slightly decreased residual moderate to large RIGHT pleural effusion with pleural vac catheter projecting in RIGHT lung base. Interstitial prominence with scattered nodular densities. RIGHT upper lobe density at site of known mass. Cardiac silhouette predominantly obscured. Calcified aortic arch. No pneumothorax. IMPRESSION: 1. Moderate to large residual RIGHT pleural effusion with pleural vac catheter in place. 2.  Aortic Atherosclerosis (ICD10-I70.0). Electronically Signed   By: Elon Alas M.D.   On: 09/19/2018 16:56   Dg Chest Port 1 View  Result Date: 10/08/2018 CLINICAL DATA:  Shortness of breath EXAM: PORTABLE CHEST 1 VIEW COMPARISON:  09/19/2010 FINDINGS: There is a large right pleural effusion. There is bilateral interstitial thickening. There is no left pleural effusion. There is no pneumothorax. There is a right apical pulmonary mass. The heart size is stable. There is no acute osseous abnormality. IMPRESSION: 1. Large right pleural effusion. Persistent right apical pulmonary mass. 2. Bilateral diffuse interstitial thickening which may reflect mild interstitial edema. Electronically Signed   By: Kathreen Devoid   On: 10/08/2018 12:41   Dg Chest Port 1 View  Result Date: 09/11/2018 CLINICAL DATA:  Increased shortness of breath EXAM: PORTABLE CHEST 1 VIEW COMPARISON:  Two days ago FINDINGS: Progressive and large right pleural effusion with near right chest white out. There is leftward mediastinal shift. Stable patchy opacity in the left lung where there are nodules by CT. IMPRESSION: 1. Large and increased right pleural effusion with mild mediastinal shift. 2. Known nodularity in the left lung. Electronically Signed   By: Monte Fantasia M.D.   On: 09/11/2018 06:07   Dg Chest Port 1  View  Result Date: 09/09/2018 CLINICAL DATA:  Acute respiratory failure. EXAM: PORTABLE CHEST 1 VIEW  COMPARISON:  Chest CT 09/07/2018 FINDINGS: Cardiomediastinal silhouette is normal. Mediastinal contours appear intact. Calcific atherosclerotic disease and tortuosity of the aorta. There is no evidence of pneumothorax. Minimally decreased right pleural effusion, still large in volume. Ill-defined right upper lobe and left lower hemithorax pulmonary masses. Osseous structures are without acute abnormality. Soft tissues are grossly normal. IMPRESSION: Minimally decreased in volume large right pleural effusion. Bilateral pulmonary masses. Calcific atherosclerotic disease and tortuosity of the aorta. Electronically Signed   By: Fidela Salisbury M.D.   On: 09/09/2018 17:46   Vas [place] Lower Extremity Venous (dvt)  Result Date: 09/19/2018  Lower Venous Study Indications: Edema.  Performing Technologist: June Leap RDMS, RVT  Examination Guidelines: A complete evaluation includes B-mode imaging, spectral Doppler, color Doppler, and power Doppler as needed of all accessible portions of each vessel. Bilateral testing is considered an integral part of a complete examination. Limited examinations for reoccurring indications may be performed as noted.  Right Venous Findings: +---------+---------------+---------+-----------+----------+-------+          CompressibilityPhasicitySpontaneityPropertiesSummary +---------+---------------+---------+-----------+----------+-------+ CFV      Full           Yes      Yes                          +---------+---------------+---------+-----------+----------+-------+ SFJ      Full                                                 +---------+---------------+---------+-----------+----------+-------+ FV Prox  Full                                                 +---------+---------------+---------+-----------+----------+-------+ FV Mid   Full                                                  +---------+---------------+---------+-----------+----------+-------+ FV DistalFull                                                 +---------+---------------+---------+-----------+----------+-------+ PFV      Full                                                 +---------+---------------+---------+-----------+----------+-------+ POP      Full           Yes      Yes                          +---------+---------------+---------+-----------+----------+-------+ PTV      Full                                                 +---------+---------------+---------+-----------+----------+-------+  PERO     Full                                                 +---------+---------------+---------+-----------+----------+-------+  Left Venous Findings: +---------+---------------+---------+-----------+----------+-------+          CompressibilityPhasicitySpontaneityPropertiesSummary +---------+---------------+---------+-----------+----------+-------+ CFV      Full           Yes      Yes                          +---------+---------------+---------+-----------+----------+-------+ SFJ      Full                                                 +---------+---------------+---------+-----------+----------+-------+ FV Prox  Full                                                 +---------+---------------+---------+-----------+----------+-------+ FV Mid   Full                                                 +---------+---------------+---------+-----------+----------+-------+ FV DistalFull                                                 +---------+---------------+---------+-----------+----------+-------+ PFV      Full                                                 +---------+---------------+---------+-----------+----------+-------+ POP      Full           Yes      Yes                           +---------+---------------+---------+-----------+----------+-------+ PTV      Full                                                 +---------+---------------+---------+-----------+----------+-------+ PERO     Full                                                 +---------+---------------+---------+-----------+----------+-------+    Summary: Right: There is no evidence of deep vein thrombosis in the lower extremity. No cystic structure found in the popliteal fossa. Left: There is no evidence of deep vein thrombosis in the lower extremity. No cystic structure found in the popliteal fossa.  *  See table(s) above for measurements and observations. Electronically signed by Curt Jews MD on 09/19/2018 at 9:42:06 PM.    Final    Ir Perc Pleural Drain [facility] Harold Cath W/img Guide  Result Date: 09/11/2018 INDICATION: Malignant right pleural effusion EXAM: ULTRASOUND AND FLUOROSCOPIC RIGHT CHEST TUNNELED PLEURAL DRAIN (PLEURX CATHETER) MEDICATIONS: The patient is currently admitted to the hospital and receiving intravenous antibiotics. The antibiotics were administered within an appropriate time frame prior to the initiation of the procedure. ANESTHESIA/SEDATION: Fentanyl 100 mcg IV; Versed 2.0 mg IV Moderate Sedation Time:  11 MINUTES The patient was continuously monitored during the procedure by the interventional radiology nurse under my direct supervision. COMPLICATIONS: None immediate. PROCEDURE: Informed written consent was obtained from the patient after a thorough discussion of the procedural risks, benefits and alternatives. All questions were addressed. Maximal Sterile Barrier Technique was utilized including caps, mask, sterile gowns, sterile gloves, sterile drape, hand hygiene and skin antiseptic. A timeout was performed prior to the initiation of the procedure. previous imaging reviewed. preliminary ultrasound performed. the complex loculated right pleural effusion was marked in the mid axillary line  through a lower intercostal space. under sterile conditions and local anesthesia, ultrasound percutaneous access performed of the right chest. needle position confirmed with ultrasound. images obtained for documentation. amplatz guidewire inserted. the pleurx catheter was tunneled subcutaneously to the puncture site and advanced into the chest through a peel-away sheath. position confirmed with ultrasound and fluoroscopy. 1.2 l thoracentesis performed following insertion. catheter secured with prolene suture. entry site closed with subcuticular vicryl suture and dermabond. no immediate complication. patient tolerated the procedure well. IMPRESSION: Successful ultrasound fluoroscopic right chest tunneled pleural drain (PleurX catheter). 1.2 L thoracentesis performed after insertion. Electronically Signed   By: Jerilynn Mages.  Shick M.D.   On: 09/11/2018 17:30        This case was discussed with Dr. [name]. He expressed agreement with my management of this patient.

## 2018-10-08 NOTE — ED Notes (Signed)
Care handed over to [facility] at this time.

## 2018-10-08 NOTE — ED Provider Notes (Signed)
[facility] DEPT Provider Note   CSN: [id number] Arrival date & time: 10/08/18  1152    History   Chief Complaint Chief Complaint  Patient presents with  . Tachycardia    HPI Brian Ramirez is a 73 y.o. male.  He has a history of lung cancer and is on oxygen.  He is brought over from the cancer center where he was receiving a transfusion for anemia.  During that time he was noted to be tachycardic and was brought over to the emergency department.  It sounds like his vitals were less tachycardic prior to the transfusion and worsened during the transfusion.  Patient states he feels lightheaded like he might pass out.  He denies any chest pain.  He feels a little more short of breath than his baseline.     The history is provided by the patient.  Dizziness  Quality:  Lightheadedness Severity:  Severe Onset quality:  Sudden Duration:  1 hour Timing:  Constant Progression:  Improving Chronicity:  New Context comment:  Transfusion Relieved by:  None tried Worsened by:  Nothing Ineffective treatments:  None tried Associated symptoms: shortness of breath   Associated symptoms: no chest pain, no vision changes and no vomiting   Risk factors: anemia     Past Medical History:  Diagnosis Date  . Hernia 2012  . History of hiatal hernia   . History of kidney stones   . Personal history of kidney stones 1992    Patient Active Problem List   Diagnosis Date Noted  . Pressure injury of skin 09/20/2018  . Malnutrition of moderate degree 09/20/2018  . Lung cancer ([place]) 09/20/2018  . Goals of care, counseling/discussion 09/20/2018  . Dyspnea 09/19/2018  . Pleural effusion 09/04/2018  . Lung mass 09/04/2018  . Mass of right lung 09/04/2018  . Left inguinal hernia s/p lap repair 11/22/2016 09/26/2016  . Difficult airway for intubation 09/26/2016    Past Surgical History:  Procedure Laterality Date  . HERNIA REPAIR  2012   inguinal  . INGUINAL  HERNIA REPAIR Left 11/22/2016   Procedure: LAPAROSCOPIC  REPAIR OF LEFT INGUINAL HERNIA WITH MESH;  Surgeon: Kyrianna Barletta Boston, MD;  Location: [facility] ORS;  Service: General;  Laterality: Left;  . IR PERC PLEURAL DRAIN W/INDWELL CATH W/IMG GUIDE  09/11/2018        Home Medications    Prior to Admission medications   Medication Sig Start Date End Date Taking? Authorizing Provider  folic acid (FOLVITE) 1 MG tablet Take 1 tablet (1 mg total) by mouth daily for 30 days. 09/25/18 10/25/18  Damita Lack, MD  Multiple Vitamin (MULTIVITAMIN WITH MINERALS) TABS tablet Take 1 tablet by mouth daily.    [provider]  polyethylene glycol (MIRALAX / GLYCOLAX) packet Take 17 g by mouth daily as needed for moderate constipation or severe constipation (use first). 09/25/18   Damita Lack, MD    Family History History reviewed. No pertinent family history.  Social History Social History   Tobacco Use  . Smoking status: Never Smoker  . Smokeless tobacco: Never Used  Substance Use Topics  . Alcohol use: Yes    Alcohol/week: 1.0 standard drinks    Types: 1 Glasses of wine per week  . Drug use: No     Allergies   Patient has no known allergies.   Review of Systems Review of Systems  Constitutional: Negative for fever.  HENT: Negative for sore throat.   Eyes: Negative for  visual disturbance.  Respiratory: Positive for shortness of breath.   Cardiovascular: Negative for chest pain.  Gastrointestinal: Negative for abdominal pain and vomiting.  Genitourinary: Negative for dysuria.  Musculoskeletal: Negative for neck pain.  Skin: Negative for rash.  Neurological: Positive for dizziness.     Physical Exam Updated Vital Signs BP 108/67   Pulse (!) 160   Temp 99.1 F (37.3 C) (Oral)   Resp (!) 33   Ht 6\' 1"  (1.854 m)   Wt 72.3 kg   SpO2 95%   BMI 21.03 kg/m   Physical Exam Vitals signs and nursing note reviewed.  Constitutional:      Appearance: He is well-developed.    HENT:     Head: Normocephalic and atraumatic.  Eyes:     Conjunctiva/sclera: Conjunctivae normal.  Neck:     Musculoskeletal: Neck supple.  Cardiovascular:     Rate and Rhythm: Tachycardia present. Rhythm irregular.     Heart sounds: No murmur.  Pulmonary:     Effort: Tachypnea and accessory muscle usage present. No respiratory distress.     Breath sounds: Normal breath sounds.     Comments: He is a Pleurx catheter in his right lower chest. Abdominal:     Palpations: Abdomen is soft.     Tenderness: There is no abdominal tenderness.  Musculoskeletal: Normal range of motion.        General: No tenderness.     Right lower leg: Edema present.     Left lower leg: Edema present.  Skin:    General: Skin is warm and dry.     Capillary Refill: Capillary refill takes less than 2 seconds.  Neurological:     General: No focal deficit present.     Mental Status: He is alert and oriented to person, place, and time.    CHA2DS2/VAS Stroke Risk Points      N/A >= 2 Points: High Risk  1 - 1.99 Points: Medium Risk  0 Points: Low Risk    A final score could not be computed because of missing components.:   Last Change: N/A     This score determines the patient's risk of having a stroke if the  patient has atrial fibrillation.      This score is not applicable to this patient. Components are not  calculated.        ED Treatments / Results  Labs (all labs ordered are listed, but only abnormal results are displayed) Labs Reviewed  BASIC METABOLIC PANEL - Abnormal; Notable for the following components:      Result Value   Sodium 134 (*)    Chloride 97 (*)    Creatinine, Ser 0.56 (*)    Calcium 7.8 (*)    All other components within normal limits  CBC - Abnormal; Notable for the following components:   RBC 3.03 (*)    Hemoglobin 8.1 (*)    HCT 27.5 (*)    MCHC 29.5 (*)    RDW 17.0 (*)    All other components within normal limits  MAGNESIUM  TSH     EKG None  Radiology Dg Chest Port 1 View  Result Date: 10/08/2018 CLINICAL DATA:  Shortness of breath EXAM: PORTABLE CHEST 1 VIEW COMPARISON:  09/19/2010 FINDINGS: There is a large right pleural effusion. There is bilateral interstitial thickening. There is no left pleural effusion. There is no pneumothorax. There is a right apical pulmonary mass. The heart size is stable. There is no acute osseous abnormality. IMPRESSION:  1. Large right pleural effusion. Persistent right apical pulmonary mass. 2. Bilateral diffuse interstitial thickening which may reflect mild interstitial edema. Electronically Signed   By: Kathreen Devoid   On: 10/08/2018 12:41    Procedures .Critical Care Performed by: Hayden Rasmussen, MD Authorized by: Hayden Rasmussen, MD   Critical care provider statement:    Critical care time (minutes):  45   Critical care time was exclusive of:  Separately billable procedures and treating other patients   Critical care was necessary to treat or prevent imminent or life-threatening deterioration of the following conditions:  Cardiac failure   Critical care was time spent personally by me on the following activities:  Discussions with consultants, evaluation of patient's response to treatment, examination of patient, ordering and performing treatments and interventions, ordering and review of laboratory studies, ordering and review of radiographic studies, pulse oximetry, re-evaluation of patient's condition, obtaining history from patient or surrogate, review of old charts and development of treatment plan with patient or surrogate   I assumed direction of critical care for this patient from another provider in my specialty: no     (including critical care time)  Medications Ordered in ED Medications  diltiazem (CARDIZEM) 1 mg/mL load via infusion 15 mg (has no administration in time range)    And  diltiazem (CARDIZEM) 100 mg in dextrose 5 % 100 mL (1 mg/mL) infusion (has no  administration in time range)     Initial Impression / Assessment and Plan / ED Course  I have reviewed the triage vital signs and the nursing notes.  Pertinent labs & imaging results that were available during my care of the patient were reviewed by me and considered in my medical decision making (see chart for details).  Clinical Course as of Oct 09 753  Mon Oct 08, 2018  1425 Patient remains tachycardic although appears he may be back in sinus now.  Cardizem drip continues to infuse.  We will recheck an EKG.   [MB]  1438 Repeat EKG now shows appears to be sinus tachycardia.   [MB]  1602 Discussed with Dr. [name] from oncology who said he will evaluate the patient tomorrow if he ends up being admitted.   [MB]  1603 Patient continues to be in what looks like a sinus tachycardia.  He is laying in bed resting comfortably with some tachypnea although he says he does not feel more short of breath than baseline.  Sats have been fine.   [MB]  1603 I put in a second page to cardiology.   [MB]  31 Discussed with cardiology Dr. Sallyanne Kuster he agrees the initial EKG looks like A. fib and he thinks the second EKG may be sinus but there could be a flutter with a 2-1 block.  He is recommending trying some adenosine.  If he ends up needing to be admitted he will have the [facility] cardiologist see him tomorrow.   [MB]  4431 Patient received 6 mg adenosine with a pause revealing flutter waves underneath.  He ultimately sped back up again to go about 150 or 60.  I reviewed this with Dr. Sallyanne Kuster and he said he would put some orders in for amiodarone.  Cardiology will see him tomorrow as an inpatient.  I reviewed this with Dr. Maryland Pink from the hospitalist service.   [MB]    Clinical Course User Index [MB] Hayden Rasmussen, MD        Final Clinical Impressions(s) / ED Diagnoses   Final  diagnoses:  Atrial flutter with rapid ventricular response (HCC)  Malignant neoplasm of lung, unspecified  laterality, unspecified part of lung (HCC)  Pleural effusion on right  Anemia, unspecified type    ED Discharge Orders         Ordered    Amb referral to AFIB Clinic     10/08/18 1209           Hayden Rasmussen, MD 10/09/18 [phone]

## 2018-10-08 NOTE — ED Notes (Addendum)
Pt arrived with O positive blood [id number] infusing through Left forearm IV

## 2018-10-08 NOTE — ED Notes (Addendum)
Blood transfusion complete at this time. Patient received 315 mls of blood. RN flushed patient line with NS and discontinued blood tubing from LFA.  No second unit ordered at this time, as ER does not have access to cancer center orders. MD aware that no orders are currently active for more blood.

## 2018-10-08 NOTE — Progress Notes (Signed)
Lab called with hgb. Pt scheduled for blood transfusion today.

## 2018-10-08 NOTE — ED Notes (Signed)
6mg  IV Rapid push of adenosine administered per MD order. Dr Melina Copa at patient bedside during push, and patient attached to continuous EKG and Defib pads. Patient tolerated well. Heart rate dropped, then resumed at its rate of 150s.

## 2018-10-08 NOTE — Progress Notes (Signed)
Upon assessment of pt after being admitted to [facility], RN noticed pt right inner thigh and left ankle were swollen, edematous (pitting), red and warm to touch. RN paged admitting physician, Maryland Pink, and he said that pt told him he noticed it last month and that it seemed to be chronic. He gave no orders, other than to let him have a heart healthy diet.

## 2018-10-08 NOTE — ED Notes (Signed)
Bed: RESA Expected date:  Expected time:  Means of arrival:  Comments: [place]

## 2018-10-08 NOTE — ED Notes (Signed)
Patient requesting food at this time, RN made MD aware and MD reported that patient may need cardioversion, and not to provide patient with food at this time.  RN explained to patient and patient agreeable at this time to plan of care

## 2018-10-09 ENCOUNTER — Encounter (HOSPITAL_COMMUNITY): Payer: Self-pay | Admitting: Cardiology

## 2018-10-09 ENCOUNTER — Other Ambulatory Visit: Payer: Self-pay

## 2018-10-09 ENCOUNTER — Inpatient Hospital Stay (HOSPITAL_COMMUNITY): Payer: Medicare Other

## 2018-10-09 DIAGNOSIS — Z7901 Long term (current) use of anticoagulants: Secondary | ICD-10-CM

## 2018-10-09 DIAGNOSIS — R600 Localized edema: Secondary | ICD-10-CM | POA: Diagnosis present

## 2018-10-09 DIAGNOSIS — Z87891 Personal history of nicotine dependence: Secondary | ICD-10-CM

## 2018-10-09 DIAGNOSIS — E440 Moderate protein-calorie malnutrition: Secondary | ICD-10-CM

## 2018-10-09 DIAGNOSIS — I509 Heart failure, unspecified: Secondary | ICD-10-CM

## 2018-10-09 DIAGNOSIS — I4892 Unspecified atrial flutter: Secondary | ICD-10-CM

## 2018-10-09 DIAGNOSIS — I480 Paroxysmal atrial fibrillation: Secondary | ICD-10-CM

## 2018-10-09 DIAGNOSIS — R0602 Shortness of breath: Secondary | ICD-10-CM

## 2018-10-09 DIAGNOSIS — R918 Other nonspecific abnormal finding of lung field: Secondary | ICD-10-CM

## 2018-10-09 DIAGNOSIS — I4891 Unspecified atrial fibrillation: Secondary | ICD-10-CM

## 2018-10-09 DIAGNOSIS — R5081 Fever presenting with conditions classified elsewhere: Secondary | ICD-10-CM

## 2018-10-09 DIAGNOSIS — J90 Pleural effusion, not elsewhere classified: Secondary | ICD-10-CM

## 2018-10-09 DIAGNOSIS — C799 Secondary malignant neoplasm of unspecified site: Secondary | ICD-10-CM

## 2018-10-09 HISTORY — DX: Localized edema: R60.0

## 2018-10-09 LAB — CBC
HCT: 24.9 % — ABNORMAL LOW (ref 39.0–52.0)
Hemoglobin: 7.2 g/dL — ABNORMAL LOW (ref 13.0–17.0)
MCH: 26.4 pg (ref 26.0–34.0)
MCHC: 28.9 g/dL — ABNORMAL LOW (ref 30.0–36.0)
MCV: 91.2 fL (ref 80.0–100.0)
Platelets: 389 10*3/uL (ref 150–400)
RBC: 2.73 MIL/uL — ABNORMAL LOW (ref 4.22–5.81)
RDW: 16.9 % — ABNORMAL HIGH (ref 11.5–15.5)
WBC: 7.8 10*3/uL (ref 4.0–10.5)
nRBC: 0 % (ref 0.0–0.2)

## 2018-10-09 LAB — COMPREHENSIVE METABOLIC PANEL
ALK PHOS: 88 U/L (ref 38–126)
ALT: 21 U/L (ref 0–44)
ANION GAP: 8 (ref 5–15)
AST: 24 U/L (ref 15–41)
Albumin: 1.5 g/dL — ABNORMAL LOW (ref 3.5–5.0)
BUN: 10 mg/dL (ref 8–23)
CO2: 27 mmol/L (ref 22–32)
Calcium: 7.8 mg/dL — ABNORMAL LOW (ref 8.9–10.3)
Chloride: 98 mmol/L (ref 98–111)
Creatinine, Ser: 0.46 mg/dL — ABNORMAL LOW (ref 0.61–1.24)
GFR calc Af Amer: >60 mL/min (ref >60)
GFR calc non Af Amer: >60 mL/min (ref >60)
GLUCOSE: 81 mg/dL (ref 70–99)
Potassium: 3.8 mmol/L (ref 3.5–5.1)
Sodium: 133 mmol/L — ABNORMAL LOW (ref 135–145)
Total Bilirubin: 1 mg/dL (ref 0.3–1.2)
Total Protein: 5.5 g/dL — ABNORMAL LOW (ref 6.5–8.1)

## 2018-10-09 LAB — ECHOCARDIOGRAM COMPLETE
Height: 73 in
Weight: 2684.32 oz

## 2018-10-09 LAB — HEMOGLOBIN AND HEMATOCRIT, BLOOD
HCT: 24.7 % — ABNORMAL LOW (ref 39.0–52.0)
Hemoglobin: 7.7 g/dL — ABNORMAL LOW (ref 13.0–17.0)

## 2018-10-09 LAB — PREPARE RBC (CROSSMATCH)

## 2018-10-09 LAB — HEPARIN LEVEL (UNFRACTIONATED): Heparin Unfractionated: 1.62 IU/mL — ABNORMAL HIGH (ref 0.30–0.70)

## 2018-10-09 LAB — BRAIN NATRIURETIC PEPTIDE: B Natriuretic Peptide: 244 pg/mL — ABNORMAL HIGH (ref 0.0–100.0)

## 2018-10-09 LAB — MAGNESIUM: MAGNESIUM: 1.8 mg/dL (ref 1.7–2.4)

## 2018-10-09 LAB — APTT: aPTT: 42 seconds — ABNORMAL HIGH (ref 24–36)

## 2018-10-09 MED ORDER — AMIODARONE HCL IN DEXTROSE 360-4.14 MG/200ML-% IV SOLN: 30.0000 mg/h | INTRAVENOUS | Status: DC

## 2018-10-09 MED ORDER — DIPHENHYDRAMINE HCL 25 MG PO CAPS
25.0000 mg | ORAL_CAPSULE | Freq: Once | ORAL | Status: AC
  Administered 2018-10-09: 25 mg via ORAL

## 2018-10-09 MED ORDER — AMIODARONE LOAD VIA INFUSION
150.0000 mg | Freq: Once | INTRAVENOUS | Status: DC
  Filled 2018-10-09: qty 83.34

## 2018-10-09 MED ORDER — AMIODARONE LOAD VIA INFUSION
150.0000 mg | Freq: Once | INTRAVENOUS | Status: AC
  Administered 2018-10-09: 150 mg via INTRAVENOUS
  Filled 2018-10-09: qty 83.34

## 2018-10-09 MED ORDER — ALBUMIN HUMAN 25 % IV SOLN
25.0000 g | Freq: Four times a day (QID) | INTRAVENOUS | Status: AC
  Administered 2018-10-09 – 2018-10-10 (×4): 25 g via INTRAVENOUS
  Filled 2018-10-09 (×2): qty 100
  Filled 2018-10-09: qty 50
  Filled 2018-10-09: qty 100

## 2018-10-09 MED ORDER — ENSURE ENLIVE PO LIQD
237.0000 mL | Freq: Two times a day (BID) | ORAL | Status: DC
  Administered 2018-10-09 – 2018-10-11 (×5): 237 mL via ORAL

## 2018-10-09 MED ORDER — MAGNESIUM SULFATE 4 GM/100ML IV SOLN
4.0000 g | Freq: Once | INTRAVENOUS | Status: AC
  Administered 2018-10-09: 4 g via INTRAVENOUS
  Filled 2018-10-09: qty 100

## 2018-10-09 MED ORDER — HEPARIN (PORCINE) 25000 UT/250ML-% IV SOLN
1350.0000 [IU]/h | INTRAVENOUS | Status: DC
  Administered 2018-10-09: 1100 [IU]/h via INTRAVENOUS
  Administered 2018-10-10: 1350 [IU]/h via INTRAVENOUS
  Filled 2018-10-09 (×2): qty 250

## 2018-10-09 MED ORDER — DIPHENHYDRAMINE HCL 25 MG PO CAPS
ORAL_CAPSULE | ORAL | Status: AC
  Filled 2018-10-09: qty 1

## 2018-10-09 MED ORDER — ACETAMINOPHEN 325 MG PO TABS: 650.0000 mg | ORAL_TABLET | Freq: Once | ORAL | Status: AC

## 2018-10-09 MED ORDER — GUAIFENESIN ER 600 MG PO TB12
1200.0000 mg | ORAL_TABLET | Freq: Two times a day (BID) | ORAL | Status: DC
  Administered 2018-10-09 – 2018-10-11 (×5): 1200 mg via ORAL
  Filled 2018-10-09 (×5): qty 2

## 2018-10-09 MED ORDER — FUROSEMIDE 10 MG/ML IJ SOLN
40.0000 mg | Freq: Two times a day (BID) | INTRAMUSCULAR | Status: DC
  Administered 2018-10-09: 40 mg via INTRAVENOUS
  Filled 2018-10-09: qty 4

## 2018-10-09 MED ORDER — POTASSIUM CHLORIDE CRYS ER 20 MEQ PO TBCR
20.0000 meq | EXTENDED_RELEASE_TABLET | Freq: Once | ORAL | Status: AC
  Administered 2018-10-09: 20 meq via ORAL
  Filled 2018-10-09: qty 1

## 2018-10-09 MED ORDER — ORAL CARE MOUTH RINSE
15.0000 mL | Freq: Two times a day (BID) | OROMUCOSAL | Status: DC
  Administered 2018-10-09 – 2018-10-11 (×2): 15 mL via OROMUCOSAL

## 2018-10-09 MED ORDER — AMIODARONE HCL IN DEXTROSE 360-4.14 MG/200ML-% IV SOLN
60.0000 mg/h | INTRAVENOUS | Status: DC
  Administered 2018-10-09 (×2): 60 mg/h via INTRAVENOUS
  Filled 2018-10-09: qty 200

## 2018-10-09 MED ORDER — AMIODARONE HCL IN DEXTROSE 360-4.14 MG/200ML-% IV SOLN
30.0000 mg/h | INTRAVENOUS | Status: DC
  Filled 2018-10-09: qty 200

## 2018-10-09 MED ORDER — FUROSEMIDE 10 MG/ML IJ SOLN
40.0000 mg | Freq: Four times a day (QID) | INTRAMUSCULAR | Status: DC
  Administered 2018-10-09 – 2018-10-11 (×7): 40 mg via INTRAVENOUS
  Filled 2018-10-09 (×7): qty 4

## 2018-10-09 MED ORDER — SODIUM CHLORIDE 0.9% IV SOLUTION
Freq: Once | INTRAVENOUS | Status: AC
  Administered 2018-10-09: 22:00:00 via INTRAVENOUS

## 2018-10-09 MED ORDER — AMIODARONE HCL IN DEXTROSE 360-4.14 MG/200ML-% IV SOLN: 60.0000 mg/h | INTRAVENOUS | Status: DC

## 2018-10-09 MED ORDER — APIXABAN 5 MG PO TABS: 5.0000 mg | ORAL_TABLET | Freq: Two times a day (BID) | ORAL | Status: DC

## 2018-10-09 MED ORDER — DILTIAZEM HCL ER COATED BEADS 120 MG PO CP24
120.0000 mg | ORAL_CAPSULE | Freq: Every day | ORAL | Status: DC
  Administered 2018-10-09 – 2018-10-10 (×2): 120 mg via ORAL
  Filled 2018-10-09 (×2): qty 1

## 2018-10-09 NOTE — Progress Notes (Signed)
ANTICOAGULATION CONSULT NOTE - Initial Consult  Pharmacy Consult for IV Heparin Indication: atrial fibrillation bridging anticoagulation while Eliquis on hold for procedure planned for 2/27  No Known Allergies  Patient Measurements: Height: 6\' 1"  (185.4 cm) Weight: 167 lb 12.3 oz (76.1 kg) IBW/kg (Calculated) : 79.9 Heparin Dosing Weight: actual body weight  Vital Signs: Temp: 98 F (36.7 C) (02/25 0800) Temp Source: Oral (02/25 0800) BP: 129/70 (02/25 1604) Pulse Rate: 102 (02/25 1604)  Labs: Recent Labs    10/08/18 0847  10/08/18 1237 10/09/18 0241 10/09/18 1254  HGB 7.2*   < > 8.1* 7.2* 7.7*  HCT 23.8*  --  27.5* 24.9* 24.7*  PLT 396  --  390 389  --   CREATININE  --   --  0.56* 0.46*  --    < > = values in this interval not displayed.    Estimated Creatinine Clearance: 91.2 mL/min (A) (by C-G formula based on SCr of 0.46 mg/dL (L)).   Medical History: Past Medical History:  Diagnosis Date  . Hernia 2012  . History of hiatal hernia   . History of kidney stones   . Personal history of kidney stones 1992    Medications:  Eliquis 5mg  BID started 2/24 for new onset AFib  Assessment:  72 yr male with metastatic NSCLC, undergoing chemotherapy, with recent pleural catheter placement for malignant right pleural effusion.  Admitted on 2/24 with new onset AFib.    Patient known to pharmacy from initial Eliquis dosing  Pleural catheter not draining as intended.  IR consulted today  Noted plan for catheter exchange on 2/27  Eliquis order modified to be d/c'ed now and resume 2/28 @ 2200  IR ok'ed bridging with heparin but would need to be held prior to procedure  Notified Dr Grandville Silos of anticoagulation recommendations and received request for pharmacy to manage IV heparin for anticoagulation bridging coverage  Last Eliquis 5mg  dose administered @ 09:03 today  Goal of Therapy:  Heparin level 0.3-0.7 APTT 66-102 sec Monitor platelets by anticoagulation  protocol: Yes   Plan:   Will begin IV heparin gtt 12 hr after last Eliquis dose with no bolus  Obtain baseline heparin level and aPTT prior to heparin starting  At 21:00 begin IV heparin gtt @ 1100 units/hr  Check heparin level 8 hr after initiation of infusion  Follow CBC daily  As Eliquis currently on hold, current order has been d/c'ed from profile and will follow up resuming post-procedure if IV heparin no longer needed.  IR please advise time you would like IV heparin infusion to be turned off prior to procedure  Thank you, Reana Chacko, Toribio Harbour, PharmD 10/09/2018,4:25 PM
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
## 2018-10-09 NOTE — Progress Notes (Signed)
Referring Physician(s): Eugenie Filler  Supervising Physician: Daryll Brod  Patient Status:  [facility] - In-pt  Chief Complaint:  Malfunctioning PleurX  HPI:  Mr Brian Ramirez is known to our service.  He had a PleurX placed on 09/11/2018 for malignant effusion.  He says it hasn't been drained in a couple of days and thinks the facility he is in didn't know how to do it properly.  Allergies: Patient has no known allergies.  Medications: Prior to Admission medications   Medication Sig Start Date End Date Taking? Authorizing Provider  docusate sodium (COLACE) 100 MG capsule Take 100 mg by mouth 2 (two) times daily.   Yes [provider]  folic acid (FOLVITE) 1 MG tablet Take 1 tablet (1 mg total) by mouth daily for 30 days. 09/25/18 10/25/18 Yes Amin, Ankit Chirag, MD  guaiFENesin (MUCINEX PO) Take 1 tablet by mouth 2 (two) times daily.   Yes [provider]  magic mouthwash SOLN Take 5 mLs by mouth 2 (two) times daily.   Yes [provider]  Multiple Vitamin (MULTIVITAMIN WITH MINERALS) TABS tablet Take 1 tablet by mouth daily. CertaVite   Yes [provider]  Nutritional Supplements (NUTRITIONAL DRINK PO) Take 1 application by mouth 3 (three) times daily.   Yes [provider]  traMADol (ULTRAM) 50 MG tablet Take by mouth every 6 (six) hours as needed for moderate pain.   Yes [provider]  polyethylene glycol (MIRALAX / GLYCOLAX) packet Take 17 g by mouth daily as needed for moderate constipation or severe constipation (use first). Patient taking differently: Take 17 g by mouth daily as needed for moderate constipation or severe constipation (use first). constipation 09/25/18   Amin, Jeanella Flattery, MD     Vital Signs: BP (!) 137/56   Pulse (!) 104   Temp 98 F (36.7 C) (Oral)   Resp (!) 27   Ht 6\' 1"  (1.854 m)   Wt 76.1 kg   SpO2 98%   BMI 22.13 kg/m   Physical Exam Vitals signs reviewed.  Constitutional:    Comments: Thin  HENT:     Head: Normocephalic and atraumatic.  Eyes:     Extraocular Movements: Extraocular movements intact.  Neck:     Musculoskeletal: Normal range of motion.  Cardiovascular:     Rate and Rhythm: Normal rate.  Pulmonary:     Effort: No respiratory distress.     Comments: Some mild effort, on O2 via [place]. Breath sounds diminished on Right secondary to effusion. Musculoskeletal: Normal range of motion.  Skin:    General: Skin is warm and dry.  Neurological:     General: No focal deficit present.     Mental Status: He is alert and oriented to person, place, and time.  Psychiatric:        Mood and Affect: Mood normal.        Behavior: Behavior normal.        Thought Content: Thought content normal.        Judgment: Judgment normal.   Using sterile technique, I placed an adaptor. I was unable to flush or aspirate the Pleural catheter.   Imaging: Dg Chest 2 View  Result Date: 10/09/2018 CLINICAL DATA:  Cancer patient, f/u RIGHT sided pleural effusion. EXAM: CHEST - 2 VIEW COMPARISON:  Chest x-rays dated 10/08/2018 and 09/19/2018. Chest CT dated 09/04/2018. FINDINGS: Large RIGHT pleural effusion, stable. Persistent masslike opacity at the RIGHT lung apex, corresponding to the anterior RIGHT upper lobe  mass described on earlier chest CT. Persistent perihilar edema, RIGHT greater than LEFT. LEFT lower lobe pulmonary nodules better demonstrated on earlier chest CT. Heart size and mediastinal contours appear stable. Chest tube at the RIGHT lung base. IMPRESSION: 1. Stable large RIGHT pleural effusion and RIGHT apical mass. Persistent perihilar edema, RIGHT greater than LEFT. 2. Chest tube at the RIGHT lung base. Electronically Signed   By: Franki Cabot M.D.   On: 10/09/2018 12:37   Dg Chest Bilateral Decubitus  Result Date: 10/09/2018 CLINICAL DATA:  Cancer patient, f/u RIGHT sided pleural effusion. EXAM: CHEST - BILATERAL DECUBITUS VIEW COMPARISON:  Chest x-ray dated  10/08/2018. FINDINGS: Large RIGHT-sided pleural effusion, without significant change on the LEFT lateral decubitus view indicating loculation. No LEFT-sided effusion identified. Chest tube in place at the RIGHT lung base IMPRESSION: Large RIGHT pleural effusion appears to be loculated, given the lack of change in configuration on the bilateral decubitus views. Chest tube in place at the RIGHT lung base. Electronically Signed   By: Franki Cabot M.D.   On: 10/09/2018 12:41   Dg Chest Port 1 View  Result Date: 10/08/2018 CLINICAL DATA:  Shortness of breath EXAM: PORTABLE CHEST 1 VIEW COMPARISON:  09/19/2010 FINDINGS: There is a large right pleural effusion. There is bilateral interstitial thickening. There is no left pleural effusion. There is no pneumothorax. There is a right apical pulmonary mass. The heart size is stable. There is no acute osseous abnormality. IMPRESSION: 1. Large right pleural effusion. Persistent right apical pulmonary mass. 2. Bilateral diffuse interstitial thickening which may reflect mild interstitial edema. Electronically Signed   By: Kathreen Devoid   On: 10/08/2018 12:41    Labs:  CBC: Recent Labs    09/28/18 1247 10/08/18 0847 10/08/18 1237 10/09/18 0241 10/09/18 1254  WBC 3.6* 8.1 8.3 7.8  --   HGB 9.1* 7.2* 8.1* 7.2* 7.7*  HCT 30.0* 23.8* 27.5* 24.9* 24.7*  PLT 208 396 390 389  --     COAGS: Recent Labs    09/07/18 0540  INR 1.03    BMP: Recent Labs    09/24/18 0521 09/28/18 1410 10/08/18 1237 10/09/18 0241  NA 135 136 134* 133*  K 4.2 4.2 3.6 3.8  CL 97* 96* 97* 98  CO2 30 32 27 27  GLUCOSE 100* 134* 93 81  BUN 21 16 10 10   CALCIUM 8.0* 8.2* 7.8* 7.8*  CREATININE 0.58* 0.60* 0.56* 0.46*  GFRNONAA >60 >60 >60 >60  GFRAA >60 >60 >60 >60    LIVER FUNCTION TESTS: Recent Labs    09/05/18 0459 09/19/18 1458 09/28/18 1410 10/09/18 0241  BILITOT 0.8 0.4 0.8 1.0  AST 28 39 49* 24  ALT 24 32 61* 21  ALKPHOS 57 62 128* 88  PROT 6.0* 5.9*  5.7* 5.5*  ALBUMIN 2.2* 1.6* 1.4* 1.5*    Assessment and Plan:  Malignant effusion - s/p placement of tunneled pleural catheter on 09/11/18.   It is now clogged.  He will need to have it exchanged.  Can plan for Thursday.  Eliquis needs to be held (I will modify the order).  Ok to bridge with Heparin but will need Wednesday evening dose and Thursday AM dose held to minimize bleeding complications.  Risks and benefits discussed with the patient including bleeding, infection, damage to adjacent structures, malfunction of the catheter with need for additional procedures.  All of the patient's questions were answered, patient is agreeable to proceed. Consent signed and in chart.    Electronically  Signed: Murrell Redden, PA-C 10/09/2018, 3:11 PM    I spent a total of 25 Minutes at the the patient's bedside AND on the patient's hospital floor or unit, greater than 50% of which was counseling/coordinating care for exchange of pleural catheter.

## 2018-10-09 NOTE — Progress Notes (Signed)
Pleurx drained, 70ml off. MD aware. Patient says he normally has >1 liter. Drainage has bloody tinge. Site cleaned and re-dressed, using sterile technique.

## 2018-10-09 NOTE — Consult Note (Addendum)
Cardiology Consultation:   Patient ID: Brian Ramirez MRN: [id number]; DOB: 09-Mar-1946  Admit date: 10/08/2018 Date of Consult: 10/09/2018  Primary Care Provider: [name], MD Primary Cardiologist: [name], MD new Primary Electrophysiologist:  None    Patient Profile:   Brian Ramirez is a 73 y.o. male with a hx of malignant neoplasm of Rt lung, anemia  who is being seen today for the evaluation of atrial fib at the request of Dr. Grandville Silos.  History of Present Illness:   Brian Ramirez has a hx of metastatic non small cell lung cancer on Keytruda and anemia with hgb yesterday of 7.2.  He was being transfused when he was found to be in a fib with RVR at 175.  He was sent to ER from Chemo area.  Also hx of large Rt malignant pl effusion with bleeding s/p pleurx catheter.    Initially EKG revealed A fib but with dilt drip no response and adenosine was given with some slowing flutter waves could be seen.  Pt without chest pain or SOB, pt was aware of rapid HR started in middle of blood transfusion.  Cardiology was consulted and amiodarone was to be started but he converted with Dilt so amio held.   Concerning anticoagulation pt with blood transfusion just prior to ER arrival, Dr. [place] Pink discussed with Dr. [name] and he was agreeable along with pt for Eliquis.   CHA2DS2Vasc of 1 but with cancer this may increase risk of stroke and plan will be to convert to SR.  He continue with Pleurx catheter and will monitor for increase blood loss.  Today with aspiration they did aspirate blood but very little to have evaluation with IR.    EKG:  The EKG was personally reviewed and demonstrates:  A fib with RVR at 175 and irregular with non specific T wave abnormalities.  Follow up appears A flutter,  And then at 1741 SR.  Today EKG SR to sinus arrhythmia  Telemetry:  Telemetry was personally reviewed and demonstrates:  SR with burst of a flutter around 0300. And again later   TSH  1.509 Na 133, K+ 3.8 BUN 10, Cr 0.46, Mg+ 1.7  Hgb 7.2 WBC 7.8 and plts 389.  CXR port. IMPRESSION: 1. Large right pleural effusion. Persistent right apical pulmonary mass. 2. Bilateral diffuse interstitial thickening which may reflect mild interstitial edema.  Currently no chest pain and no SOB resting in bed.  BP stable at 137/68 resp 16-24 --with recurrent PAFL.  IV amiodarone was started.  Discussed a flutter with pt   Past Medical History:  Diagnosis Date  . Hernia 2012  . History of hiatal hernia   . History of kidney stones   . Personal history of kidney stones 1992    Past Surgical History:  Procedure Laterality Date  . HERNIA REPAIR  2012   inguinal  . INGUINAL HERNIA REPAIR Left 11/22/2016   Procedure: LAPAROSCOPIC  REPAIR OF LEFT INGUINAL HERNIA WITH MESH;  Surgeon: [name], MD;  Location: [facility];  Service: General;  Laterality: Left;  . IR PERC PLEURAL DRAIN W/INDWELL CATH W/IMG GUIDE  09/11/2018     Home Medications:  Prior to Admission medications   Medication Sig Start Date End Date Taking? Authorizing Provider  docusate sodium (COLACE) 100 MG capsule Take 100 mg by mouth 2 (two) times daily.   Yes [provider]  folic acid (FOLVITE) 1 MG tablet Take 1 tablet (1 mg total) by mouth daily for 30  days. 09/25/18 10/25/18 Yes Amin, Jeanella Flattery, MD  guaiFENesin (MUCINEX PO) Take 1 tablet by mouth 2 (two) times daily.   Yes [provider]  magic mouthwash SOLN Take 5 mLs by mouth 2 (two) times daily.   Yes [provider]  Multiple Vitamin (MULTIVITAMIN WITH MINERALS) TABS tablet Take 1 tablet by mouth daily. CertaVite   Yes [provider]  Nutritional Supplements (NUTRITIONAL DRINK PO) Take 1 application by mouth 3 (three) times daily.   Yes [provider]  traMADol (ULTRAM) 50 MG tablet Take by mouth every 6 (six) hours as needed for moderate pain.   Yes [provider]  polyethylene glycol (MIRALAX /  GLYCOLAX) packet Take 17 g by mouth daily as needed for moderate constipation or severe constipation (use first). Patient taking differently: Take 17 g by mouth daily as needed for moderate constipation or severe constipation (use first). constipation 09/25/18   Amin, Jeanella Flattery, MD    Inpatient Medications: Scheduled Meds: . apixaban  5 mg Oral BID  . docusate sodium  100 mg Oral BID  . feeding supplement (ENSURE ENLIVE)  237 mL Oral BID BM  . folic acid  1 mg Oral Daily  . furosemide  40 mg Intravenous Q12H  . guaiFENesin  1,200 mg Oral BID  . mouth rinse  15 mL Mouth Rinse BID  . multivitamin with minerals  1 tablet Oral Daily  . sodium chloride flush  3 mL Intravenous Q12H   Continuous Infusions: . sodium chloride    . albumin human 60 mL/hr at 10/09/18 1015  . amiodarone 60 mg/hr (10/09/18 1217)   Followed by  . amiodarone     PRN Meds: sodium chloride, acetaminophen **OR** acetaminophen, albuterol, ondansetron **OR** ondansetron (ZOFRAN) IV, polyethylene glycol, sodium chloride flush, traMADol  Allergies:   No Known Allergies  Social History:   Social History   Socioeconomic History  . Marital status: Single    Spouse name: Not on file  . Number of children: Not on file  . Years of education: Not on file  . Highest education level: Not on file  Occupational History  . Not on file  Social Needs  . Financial resource strain: Not on file  . Food insecurity:    Worry: Not on file    Inability: Not on file  . Transportation needs:    Medical: Not on file    Non-medical: Not on file  Tobacco Use  . Smoking status: Never Smoker  . Smokeless tobacco: Never Used  Substance and Sexual Activity  . Alcohol use: Yes    Alcohol/week: 1.0 standard drinks    Types: 1 Glasses of wine per week  . Drug use: No  . Sexual activity: Not on file  Lifestyle  . Physical activity:    Days per week: Not on file    Minutes per session: Not on file  . Stress: Not on file    Relationships  . Social connections:    Talks on phone: Not on file    Gets together: Not on file    Attends religious service: Not on file    Active member of club or organization: Not on file    Attends meetings of clubs or organizations: Not on file    Relationship status: Not on file  . Intimate partner violence:    Fear of current or ex partner: Not on file    Emotionally abused: Not on file    Physically abused: Not on  file    Forced sexual activity: Not on file  Other Topics Concern  . Not on file  Social History Narrative  . Not on file    Family History:    Family History  Problem Relation Age of Onset  . Heart disease Father   . Cancer Brother      ROS:  Please see the history of present illness.  General:no colds or fevers, + weight changes Skin:no rashes or ulcers HEENT:no blurred vision, no congestion CV:see HPI PUL:see HPI GI:no diarrhea constipation or melena, no indigestion GU:no hematuria, no dysuria MS:no joint pain, no claudication Neuro:no syncope, no lightheadedness Endo:no diabetes, no thyroid disease Heme + anemia   All other ROS reviewed and negative.     Physical Exam/Data:   Vitals:   10/09/18 0916 10/09/18 1000 10/09/18 1100 10/09/18 1200  BP:  (!) 124/57 (!) 125/59 (!) 115/57  Pulse:  99 98 87  Resp:  (!) 24 (!) 33 (!) 23  Temp:      TempSrc:      SpO2:  98% 96% 99%  Weight: 76.1 kg     Height:        Intake/Output Summary (Last 24 hours) at 10/09/2018 1400 Last data filed at 10/09/2018 1222 Gross per 24 hour  Intake 872.11 ml  Output 1925 ml  Net -1052.89 ml   Last 3 Weights 10/09/2018 10/08/2018 10/08/2018  Weight (lbs) 167 lb 12.3 oz 165 lb 5.5 oz 159 lb 6.3 oz  Weight (kg) 76.1 kg 75 kg 72.3 kg     Body mass index is 22.13 kg/m.  General:  Frail thin male, in no acute distress, resting almost flat in bed HEENT: normal Lymph: no adenopathy Neck: + JVD Endocrine:  No thryomegaly Vascular: No carotid bruits; pedal  pulses 2+ bilaterally   Cardiac:  normal S1, S2; RRR; no murmur gallup rub or click Lungs:  clear to auscultation on left, very diminished breath sounds on Rt. no wheezing, rhonchi or rales  Abd: soft, nontender, no hepatomegaly  Ext: no to tr edema Musculoskeletal:  No deformities, BUE and BLE strength normal and equal Skin: warm and dry  Neuro:  Alert and oriented X 3 MAE follows commands, no focal abnormalities noted Psych:  Normal affect   Relevant CV Studies: Echo ordered for today  10-16-18 Venous dopplers  With no DVT bil.   Laboratory Data:  Chemistry Recent Labs  Lab 10/08/18 1237 10/09/18 0241  NA 134* 133*  K 3.6 3.8  CL 97* 98  CO2 27 27  GLUCOSE 93 81  BUN 10 10  CREATININE 0.56* 0.46*  CALCIUM 7.8* 7.8*  GFRNONAA >60 >60  GFRAA >60 >60  ANIONGAP 10 8    Recent Labs  Lab 10/09/18 0241  PROT 5.5*  ALBUMIN 1.5*  AST 24  ALT 21  ALKPHOS 88  BILITOT 1.0   Hematology Recent Labs  Lab 10/08/18 0847 10/08/18 1237 10/09/18 0241 10/09/18 1254  WBC 8.1 8.3 7.8  --   RBC 2.71* 3.03* 2.73*  --   HGB 7.2* 8.1* 7.2* 7.7*  HCT 23.8* 27.5* 24.9* 24.7*  MCV 87.8 90.8 91.2  --   MCH 26.6 26.7 26.4  --   MCHC 30.3 29.5* 28.9*  --   RDW 17.3* 17.0* 16.9*  --   PLT 396 390 389  --    Cardiac EnzymesNo results for input(s): TROPONINI in the last 168 hours. No results for input(s): TROPIPOC in the last 168 hours.  BNP Recent  Labs  Lab 10/09/18 0241  BNP 244.0*    DDimer No results for input(s): DDIMER in the last 168 hours.  Radiology/Studies:  Dg Chest 2 View  Result Date: 10/09/2018 CLINICAL DATA:  Cancer patient, f/u RIGHT sided pleural effusion. EXAM: CHEST - 2 VIEW COMPARISON:  Chest x-rays dated 10/08/2018 and 09/19/2018. Chest CT dated 09/04/2018. FINDINGS: Large RIGHT pleural effusion, stable. Persistent masslike opacity at the RIGHT lung apex, corresponding to the anterior RIGHT upper lobe mass described on earlier chest CT. Persistent perihilar  edema, RIGHT greater than LEFT. LEFT lower lobe pulmonary nodules better demonstrated on earlier chest CT. Heart size and mediastinal contours appear stable. Chest tube at the RIGHT lung base. IMPRESSION: 1. Stable large RIGHT pleural effusion and RIGHT apical mass. Persistent perihilar edema, RIGHT greater than LEFT. 2. Chest tube at the RIGHT lung base. Electronically Signed   By: Franki Cabot M.D.   On: 10/09/2018 12:37   Dg Chest Bilateral Decubitus  Result Date: 10/09/2018 CLINICAL DATA:  Cancer patient, f/u RIGHT sided pleural effusion. EXAM: CHEST - BILATERAL DECUBITUS VIEW COMPARISON:  Chest x-ray dated 10/08/2018. FINDINGS: Large RIGHT-sided pleural effusion, without significant change on the LEFT lateral decubitus view indicating loculation. No LEFT-sided effusion identified. Chest tube in place at the RIGHT lung base IMPRESSION: Large RIGHT pleural effusion appears to be loculated, given the lack of change in configuration on the bilateral decubitus views. Chest tube in place at the RIGHT lung base. Electronically Signed   By: Franki Cabot M.D.   On: 10/09/2018 12:41   Dg Chest Port 1 View  Result Date: 10/08/2018 CLINICAL DATA:  Shortness of breath EXAM: PORTABLE CHEST 1 VIEW COMPARISON:  09/19/2010 FINDINGS: There is a large right pleural effusion. There is bilateral interstitial thickening. There is no left pleural effusion. There is no pneumothorax. There is a right apical pulmonary mass. The heart size is stable. There is no acute osseous abnormality. IMPRESSION: 1. Large right pleural effusion. Persistent right apical pulmonary mass. 2. Bilateral diffuse interstitial thickening which may reflect mild interstitial edema. Electronically Signed   By: Kathreen Devoid   On: 10/08/2018 12:41    Assessment and Plan:   1. Atrial fib /flutter with acute anemia due to metastatic lung cancer. Now on Eliquis monitor H/H and would continue to transfuse today.  Was anemic with admit 09/04/18 when lung  cancer diagnosed.   Current meds: eliquis, and dilt just recently stopped and on amiodarone IV  2. Anticoagulation with Eliquis - blood from pleurx cath. 3. Metastatic lung cancer with chemo.   4. Large Rt pl effusion with Pleurx cath per IM, + 3L of bloody fluid removed Jan 2020.  To have re-evaluated by IR today 5. Albumin low -replacing  6. Anemia to be re-checked at noon   For questions or updates, please contact [place] Please consult [email or web address] for contact info under   Signed, [name], MD  10/09/2018 2:00 PM  The patient was seen, examined and discussed with Cecilie Kicks, NP and I agree with the above.   73 y.o. male with a hx of malignant neoplasm of Rt lung - metastatic non small cell lung cancer on Keytruda and anemia with hgb yesterday of 7.2.  He was being transfused when he was found to be in a fib with RVR at 175.  He was sent to ER from Chemo area.  Also hx of large Rt malignant pl effusion with bleeding s/p pleurx catheter.   Initially EKG revealed A fib  but with dilt drip no response and adenosine was given with some slowing flutter waves could be seen.  Pt without chest pain or SOB, pt was aware of rapid HR started in middle of blood transfusion.  Cardiology was consulted and amiodarone was to be started but he converted with Dilt so amio held.   The patient has no prior history of cardiac disease and no arrhythmias.  At this point I would discontinue amiodarone given underlying lung cancer, I would obtain an echocardiogram, and start low-dose Cardizem CD 120 mg daily.  If he does not tolerate this with blood pressure I would switch to Lopressor 25 mg p.o. twice daily.  With regards to anticoagulation it seems that decision has been made for Eliquis 5 mg p.o. twice daily, hemoglobin has to be followed closely. CHA2DS2Vasc of 1 but with cancer this may increase risk of stroke and plan will be to convert to SR.  He continue with Pleurx catheter and will monitor for  increase blood loss.    His chest x-ray shows large right pleural effusion over right mass, and bilateral pulmonary edema, on physical exam he has mildly elevated JVDs decreased breathing sounds on the right up to the mid lungs and crackles in the left base, 2+ pitting lower extremity edema with erythema.  I would increase Lasix IV 40 mg every 6 hours, creatinine and electrolytes are normal.  Elevate legs and apply TED hoses bilaterally, start mobilizing.  [name], MD 10/09/2018

## 2018-10-09 NOTE — Discharge Instructions (Signed)

## 2018-10-09 NOTE — Progress Notes (Signed)
  Echocardiogram 2D Echocardiogram has been performed.  Brian Ramirez 10/09/2018, 4:30 PM

## 2018-10-09 NOTE — Progress Notes (Signed)
PROGRESS NOTE    Brian Ramirez  WJX:[id number] DOB: 12-24-1945 DOA: 10/08/2018 PCP: [name], MD    Brief Narrative:  Patient is a pleasant 73 year old gentleman history of recently diagnosed metastatic non-small cell lung cancer 1 cycle of systemic chemotherapy, history of large right malignant pleural effusion with bleeding status post right Pleurx catheter placement which per patient usually drains bloody fluid, anemia of chronic disease, moderate protein calorie malnutrition.  Patient states right Pleurx catheter has not been drained in 2 days at the skilled nursing facility where he was at as they had tried to drain it with no output and he felt they attempted it wrongly.  Patient had presented to his oncologist office for blood work noted to have a hemoglobin of 7.2 was getting a unit of packed red blood cell transfusion when noted to have heart rates in the 150s and noted need to be in A. fib.  Patient had no response to Cardizem infusion as well as adenosine.  Patient sent to the ED.  Patient noted to have flutter waves.  TSH was done within normal limits.  2D echo ordered.  Patient started on Eliquis in discussion with oncology and due to increased risk of stroke. Patient admitted noted to go into sinus rhythm overnight and as such amiodarone was never given.  Patient seems to be going in and out of atrial fibrillation.  Cardiology consultation pending.   Assessment & Plan:   Principal Problem:   Atrial flutter with rapid ventricular response (HCC) Active Problems:   Pleural effusion   Mass of right lung   Malnutrition of moderate degree   Lung cancer (HCC)   Anemia of chronic disease   Edema of both lower extremities   Pleural effusion on right   Acute congestive heart failure (HCC)  1 atrial flutter with RVR Questionable etiology.  May be secondary to patient's lung mass with large right malignant pleural effusion versus effusion not drained for over the past 2 days.   Patient seems to be going in and out of atrial flutter overnight.  Patient noted to go into atrial flutter around 3:19 AM and then back into normal sinus rhythm.  Patient went back into atrial flutter this morning around 9:05 AM and then back into normal sinus rhythm.  TSH within normal limits.  Check a magnesium level.  Check a BNP.  Repeat chest x-ray.  2D echo pending.  We will give amiodarone bolus 150 mg x 1 and placed on amiodarone drip while awaiting cardiology evaluation.  Patient started on Eliquis for anticoagulation.  Cardiology consultation pending.  Follow.  2.  Metastatic non-small cell lung cancer Status post 1 cycle of chemotherapy 09/28/2018.  Per oncology.  3.  Anemia of chronic disease as well as acute blood loss anemia Patient notes to have bleeding into the right pleural space and states that usual drainage of right Pleurx catheter noted to have blood in it.  Patient received a unit of packed red blood cells at the cancer center prior to admission.  Hemoglobin was 8.1 on presentation to the ED.  Hemoglobin currently at 7.2.  Repeat H&H this afternoon.  Transfusion threshold hemoglobin less than 7.  Monitor closely as patient also on anticoagulation with Eliquis.  4.  Bilateral lower extremity edema/acute CHF exacerbation. Patient noted to have diffuse crackles on examination with bilateral lower extremity edema extending up to hips.  Patient may be in acute CHF in the setting of atrial flutter.  Check a BNP.  Repeat chest x-ray.  Placed on Lasix 40 mg IV every 12 hours.  Strict I's and O's.  Daily weights.  Follow.  Cardiology consultation pending.  5.  Malignant right-sided pleural effusion this was Pleurx catheter Status post Pleurx catheter.  Patient states Pleurx catheter has not been draining the past 2 days at nursing facility.  Patient feels attempted drainage was wrong.  Repeat chest x-ray pending this morning.  RN to drain Pleurx catheter.  If unable to drain Pleurx catheter  will have IR assess Pleurx catheter.  6.  Severe protein calorie malnutrition Likely secondary to problem #2.  Albumin level of 1.5.  Placed on IV albumin every 6 hours x1 day.  Follow.    DVT prophylaxis: Eliquis Code Status: Full Family Communication: Updated patient.  No family present. Disposition Plan: Remain in stepdown unit.   Consultants:   Cardiology pending  Procedures:   Chest x-ray 10/08/2018    Antimicrobials:   None   Subjective: Patient sitting up in bed.  Denies any chest pain or shortness of breath or dizziness this morning.  Patient however stated had some palpitations overnight.  Telemetry reviewed and patient noted to be going in and out of atrial flutter.  Patient went into atrial flutter around 319 this morning.  Was in normal sinus rhythm.  However this morning just prior to me assessing the patient around 9:05 AM patient went back into atrial flutter with heart rates in the 150s to 160s and then subsequently converted back to normal sinus rhythm.  Objective: Vitals:   10/09/18 0700 10/09/18 0800 10/09/18 0838 10/09/18 0900  BP: (!) 123/46 137/68  134/60  Pulse: 83 87 89 95  Resp: 19 (!) 28 (!) 29 (!) 25  Temp:  98 F (36.7 C)    TempSrc:  Oral    SpO2: 98% 96% 98% 96%  Weight:      Height:        Intake/Output Summary (Last 24 hours) at 10/09/2018 0928 Last data filed at 10/09/2018 0739 Gross per 24 hour  Intake 167.16 ml  Output 885 ml  Net -717.84 ml   Filed Weights   10/08/18 1202 10/08/18 1835  Weight: 72.3 kg 75 kg    Examination:  General exam: Appears calm and comfortable  Respiratory system: Diffuse crackles noted right greater than left.  Decreased breath sounds in the right base.  No wheezing noted.  Fair air movement.   Cardiovascular system: Regular rate and rhythm no murmurs rubs or gallops.  2+ bilateral lower extremity edema up to hips.  No murmurs rubs or gallops.  Gastrointestinal system: Abdomen is nondistended, soft  and nontender. No organomegaly or masses felt. Normal bowel sounds heard. Central nervous system: Alert and oriented. No focal neurological deficits. Extremities: Symmetric 5 x 5 power. Skin: No rashes, lesions or ulcers Psychiatry: Judgement and insight appear normal. Mood & affect appropriate.     Data Reviewed: I have personally reviewed following labs and imaging studies  CBC: Recent Labs  Lab 10/08/18 0847 10/08/18 1237 10/09/18 0241  WBC 8.1 8.3 7.8  NEUTROABS 6.2  --   --   HGB 7.2* 8.1* 7.2*  HCT 23.8* 27.5* 24.9*  MCV 87.8 90.8 91.2  PLT 396 390 425   Basic Metabolic Panel: Recent Labs  Lab 10/08/18 1237 10/09/18 0241  NA 134* 133*  K 3.6 3.8  CL 97* 98  CO2 27 27  GLUCOSE 93 81  BUN 10 10  CREATININE 0.56* 0.46*  CALCIUM 7.8*  7.8*  MG 1.7 1.8   GFR: Estimated Creatinine Clearance: 89.8 mL/min (A) (by C-G formula based on SCr of 0.46 mg/dL (L)). Liver Function Tests: Recent Labs  Lab 10/09/18 0241  AST 24  ALT 21  ALKPHOS 88  BILITOT 1.0  PROT 5.5*  ALBUMIN 1.5*   No results for input(s): LIPASE, AMYLASE in the last 168 hours. No results for input(s): AMMONIA in the last 168 hours. Coagulation Profile: No results for input(s): INR, PROTIME in the last 168 hours. Cardiac Enzymes: No results for input(s): CKTOTAL, CKMB, CKMBINDEX, TROPONINI in the last 168 hours. BNP (last 3 results) No results for input(s): PROBNP in the last 8760 hours. HbA1C: No results for input(s): HGBA1C in the last 72 hours. CBG: No results for input(s): GLUCAP in the last 168 hours. Lipid Profile: No results for input(s): CHOL, HDL, LDLCALC, TRIG, CHOLHDL, LDLDIRECT in the last 72 hours. Thyroid Function Tests: Recent Labs    10/08/18 1237  TSH 1.509   Anemia Panel: No results for input(s): VITAMINB12, FOLATE, FERRITIN, TIBC, IRON, RETICCTPCT in the last 72 hours. Sepsis Labs: No results for input(s): PROCALCITON, LATICACIDVEN in the last 168 hours.  Recent  Results (from the past 240 hour(s))  MRSA PCR Screening     Status: None   Collection Time: 10/08/18  6:28 PM  Result Value Ref Range Status   MRSA by PCR NEGATIVE NEGATIVE Final    Comment:        The GeneXpert MRSA Assay (FDA approved for NASAL specimens only), is one component of a comprehensive MRSA colonization surveillance program. It is not intended to diagnose MRSA infection nor to guide or monitor treatment for MRSA infections. Performed at [facility], [place] [address], [place], [place] [address]          Radiology Studies: Dg Chest Port 1 View  Result Date: 10/08/2018 CLINICAL DATA:  Shortness of breath EXAM: PORTABLE CHEST 1 VIEW COMPARISON:  09/19/2010 FINDINGS: There is a large right pleural effusion. There is bilateral interstitial thickening. There is no left pleural effusion. There is no pneumothorax. There is a right apical pulmonary mass. The heart size is stable. There is no acute osseous abnormality. IMPRESSION: 1. Large right pleural effusion. Persistent right apical pulmonary mass. 2. Bilateral diffuse interstitial thickening which may reflect mild interstitial edema. Electronically Signed   By: Kathreen Devoid   On: 10/08/2018 12:41        Scheduled Meds: . amiodarone  150 mg Intravenous Once  . apixaban  5 mg Oral BID  . docusate sodium  100 mg Oral BID  . folic acid  1 mg Oral Daily  . furosemide  40 mg Intravenous Q12H  . guaiFENesin  1,200 mg Oral BID  . mouth rinse  15 mL Mouth Rinse BID  . multivitamin with minerals  1 tablet Oral Daily  . sodium chloride flush  3 mL Intravenous Q12H   Continuous Infusions: . sodium chloride    . albumin human    . amiodarone 60 mg/hr (10/09/18 0921)   Followed by  . amiodarone       LOS: 1 day    Time spent: 40 minutes    Irine Seal, MD [facility]  If 7PM-7AM, please contact night-coverage [email or web address] 10/09/2018, 9:28 AM

## 2018-10-09 NOTE — Progress Notes (Signed)
Initial Nutrition Assessment  DOCUMENTATION CODES:   Non-severe (moderate) malnutrition in context of chronic illness  INTERVENTION:  - Will order Ensure Enlive BID, each supplement provides 350 kcal and 20 grams of protein. - Continue to encourage PO intakes.    NUTRITION DIAGNOSIS:   Moderate Malnutrition related to chronic illness, catabolic illness, cancer and cancer related treatments as evidenced by moderate fat depletion, moderate muscle depletion.  GOAL:   Patient will meet greater than or equal to 90% of their needs  MONITOR:   PO intake, Supplement acceptance, Weight trends, Labs, Skin  REASON FOR ASSESSMENT:   Malnutrition Screening Tool  ASSESSMENT:   73 year old gentleman history of recently diagnosed metastatic non-small cell lung cancer s/p 1 cycle of chemo, hx of large R malignant pleural effusion s/p R Pleurx, anemia of chronic disease, and moderate protein calorie malnutrition. Patient stated Pleurx catheter had not been drained in 2 days at [facility]. He presented to Oncologist's office for blood work; noted to have a hemoglobin of 7.2 and was getting a unit of PRBC when he was noted to have heart rates in the 150s. He had no response to Cardizem or adenosine and was sent to the ED. Patient seems to be going in and out of atrial fibrillation. Cardiology consultation pending.  BMI indicates normal weight. No intakes documented since admission. Patient reports very good appetite now and PTA. He usually eats 4-5 meal/day when he is at home, but reports being in [facility] since d/c from hospitalization earlier this month. He does not like the food served here and states that the food at [facility] is even worse.  At home he eats very healthy items and limits sodium and sugar as much as possible. He typically has a large breakfast that may be an omelet with peppers, onions, and sweet potato and toast with avocado or steel-cut oats with walnuts, fruit, and quinoa and a  side of avocado. He likes to make protein shakes: protein powder, water, spirulina, almonds, and a fruit such as blueberries.   Patient denies any abdominal pain or nausea now or PTA. He denies any chewing or swallowing difficulties. He is interested in receiving Ensure during hospitalization.   Per chart review, current weight is 165 lb and weight on 2/5 was 159 lb. Will continue to monitor closely as patient is prescribed diuretic.    Medications reviewed; 25 g albumin QID today, 100 mg colace BID, 1 mg folvite/day, 40 mg IV lasix BID, 4 g IV Mg sulfate x1 run today, daily multivitamin with minerals, 20 mEq K-Dur x1 dose today. Labs reviewed; Na: 133 mmol/l, creatinine: 0.46 mg/dl, Ca: 7.8 mg/dl.      NUTRITION - FOCUSED PHYSICAL EXAM:    Most Recent Value  Orbital Region  No depletion  Upper Arm Region  Moderate depletion  Thoracic and Lumbar Region  Unable to assess  Buccal Region  Moderate depletion  Temple Region  Mild depletion  Clavicle Bone Region  Moderate depletion  Clavicle and Acromion Bone Region  Moderate depletion  Scapular Bone Region  Unable to assess  Dorsal Hand  Mild depletion  Patellar Region  Unable to assess  Anterior Thigh Region  Unable to assess  Posterior Calf Region  Unable to assess  Edema (RD Assessment)  Unable to assess  Hair  Reviewed  Eyes  Reviewed  Mouth  Reviewed  Skin  Reviewed  Nails  Reviewed       Diet Order:   Diet Order  Diet heart healthy/carb modified Room service appropriate? Yes; Fluid consistency: Thin  Diet effective now              EDUCATION NEEDS:   No education needs have been identified at this time  Skin:  Skin Assessment: Skin Integrity Issues: Skin Integrity Issues:: Stage I Stage I: sacrum  Last BM:  2/24  Height:   Ht Readings from Last 1 Encounters:  10/08/18 6\' 1"  (1.854 m)    Weight:   Wt Readings from Last 1 Encounters:  10/09/18 76.1 kg    Ideal Body Weight:  83.64  kg  BMI:  Body mass index is 22.13 kg/m.  Estimated Nutritional Needs:   Kcal:  4584-8350 kcal  Protein:  115-130 grams  Fluid:  >/= 2.2 L/day     Jarome Matin, MS, RD, LDN, [facility] Inpatient Clinical Dietitian Pager # [phone] After hours/weekend pager # [phone]

## 2018-10-09 NOTE — Progress Notes (Addendum)
  Amiodarone Drug - Drug Interaction Consult Note  Recommendations: Monitor ondansetron usage and check EKG if requiring consistently.  Amiodarone is metabolized by the cytochrome P450 system and therefore has the potential to cause many drug interactions. Amiodarone has an average plasma half-life of 50 days (range 20 to 100 days).   There is potential for drug interactions to occur several weeks or months after stopping treatment and the onset of drug interactions may be slow after initiating amiodarone.   []  Statins: Increased risk of myopathy. Simvastatin- restrict dose to 20mg  daily. Other statins: counsel patients to report any muscle pain or weakness immediately.  []  Anticoagulants: Amiodarone can increase anticoagulant effect. Consider warfarin dose reduction. Patients should be monitored closely and the dose of anticoagulant altered accordingly, remembering that amiodarone levels take several weeks to stabilize.  []  Antiepileptics: Amiodarone can increase plasma concentration of phenytoin, the dose should be reduced. Note that small changes in phenytoin dose can result in large changes in levels. Monitor patient and counsel on signs of toxicity.  []  Beta blockers: increased risk of bradycardia, AV block and myocardial depression. Sotalol - avoid concomitant use.  []   Calcium channel blockers (diltiazem and verapamil): increased risk of bradycardia, AV block and myocardial depression.  []   Cyclosporine: Amiodarone increases levels of cyclosporine. Reduced dose of cyclosporine is recommended.  []  Digoxin dose should be halved when amiodarone is started.  []  Diuretics: increased risk of cardiotoxicity if hypokalemia occurs.  []  Oral hypoglycemic agents (glyburide, glipizide, glimepiride): increased risk of hypoglycemia. Patient's glucose levels should be monitored closely when initiating amiodarone therapy.   [x]  Drugs that prolong the QT interval:  Torsades de pointes risk may be  increased with concurrent use - avoid if possible.  Monitor QTc, also keep magnesium/potassium WNL if concurrent therapy can't be avoided. Marland Kitchen Antibiotics: e.g. fluoroquinolones, erythromycin.  Ondansetron . Antiarrhythmics: e.g. quinidine, procainamide, disopyramide, sotalol. . Antipsychotics: e.g. phenothiazines, haloperidol.  . Lithium, tricyclic antidepressants, and methadone.  Thank You,  Peggyann Juba, PharmD, BCPS 10/09/2018 8:56 AM
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
## 2018-10-09 NOTE — Progress Notes (Signed)
IP PROGRESS NOTE  Subjective:   Brian Ramirez is known to me with a history of metastatic non-small cell lung cancer.  He has completed 1 cycle of systemic therapy with Alimta/carboplatin and pembrolizumab.  He tolerated the treatment without significant acute toxicity.  No rash or diarrhea. He presented to the Cancer center for a red cell transfusion yesterday.  He developed rapid atrial fibrillation and was transferred to the emergency room.  The rhythm changed to atrial flutter after he was given adenosine.  The heart rate has converted to sinus rhythm.  He was placed on Eliquis anticoagulation.  Brian Ramirez reports feeling better with conversion to sinus rhythm.  He has been participating in physical therapy at the skilled nursing facility.  He is ambulatory.  He reports approximately 250 cc of fluid are being drained from the Pleurx catheter.  Objective: Vital signs in last 24 hours: Blood pressure 137/68, pulse 89, temperature 98.6 F (37 C), temperature source Oral, resp. rate (!) 29, height '6\' 1"'$  (1.854 m), weight 165 lb 5.5 oz (75 kg), SpO2 98 %.  Intake/Output from previous day: 02/24 0701 - 02/25 0700 In: 167.2 [I.V.:167.2] Out: 68 [Urine:685]  Physical Exam:  HEENT: No thrush or ulcers Lungs: Inspiratory rhonchi at the left posterior base, decreased breath sounds throughout the right chest, no respiratory distress Cardiac: Regular rate and rhythm Abdomen: No hepatosplenomegaly Extremities: Hitting edema at the low leg bilaterally Skin: Erythema at the left lower leg and right upper posterior calf, no palpable cord  Portacath/PICC-without erythema  Lab Results: Recent Labs    10/08/18 1237 10/09/18 0241  WBC 8.3 7.8  HGB 8.1* 7.2*  HCT 27.5* 24.9*  PLT 390 389    BMET Recent Labs    10/08/18 1237 10/09/18 0241  NA 134* 133*  K 3.6 3.8  CL 97* 98  CO2 27 27  GLUCOSE 93 81  BUN 10 10  CREATININE 0.56* 0.46*  CALCIUM 7.8* 7.8*    No results found for:  CEA1  Studies/Results: Dg Chest Port 1 View  Result Date: 10/08/2018 CLINICAL DATA:  Shortness of breath EXAM: PORTABLE CHEST 1 VIEW COMPARISON:  09/19/2010 FINDINGS: There is a large right pleural effusion. There is bilateral interstitial thickening. There is no left pleural effusion. There is no pneumothorax. There is a right apical pulmonary mass. The heart size is stable. There is no acute osseous abnormality. IMPRESSION: 1. Large right pleural effusion. Persistent right apical pulmonary mass. 2. Bilateral diffuse interstitial thickening which may reflect mild interstitial edema. Electronically Signed   By: Kathreen Devoid   On: 10/08/2018 12:41    Medications: I have reviewed the patient's current medications.  Assessment/Plan: 1.Metastatic non-small cell lung cancer  largeright pleural effusion, right lung mass, right pleural-based masses, left lung nodules  Right thoracentesis 09/04/2018-negative cytology  CT biopsy of right lateral pleural mass 09/07/2018-poorly differentiated non-small cell carcinoma, malignant cells positive for cytokeratin AE1/AE3 and cytokeratin 8/18, histology favoring adenocarcinoma, MSS, tumor mutation burden 5.no EGFR, ALK, BRAF alteration  PDL 1 tumor proportion score-100%   Cycle 1 Alimta/carboplatin 09/22/2018, pembrolizumab 09/28/2018  2.Severe anemia-likely secondary to bleeding in the right pleural space and metastatic carcinoma, red cell transfusions 09/09/2018 09/10/2018, 09/23/2018 3.Dyspnea secondary to the large right pleural effusion and anemia  Right Pleurx catheter placed 09/11/2018 4.History of tobacco use 5.History of kidney stones 6.  Fever-most likely tumor fever, improved 7.  Admission 10/08/2018 with rapid atrial fibrillation/flutter- improved with diltiazem, started on Eliquis anticoagulation  Brian Ramirez has advanced  stage non-small cell lung cancer.  He has completed 1 cycle of systemic therapy with Alimta/carboplatin and  pembrolizumab.  He is scheduled for cycle 2 on 10/15/2018. He has a malignant right pleural effusion with a Pleurx catheter in place.  The severe anemia is secondary to bleeding in the pleural space, chronic disease, and chemotherapy.  I suspect the arrhythmia is related to the anemia and chest tumor burden.  He is at risk for bleeding with anticoagulation therapy, but he is also at high risk in the setting of metastatic carcinoma.  I agree with anticoagulation therapy if cardiology feels this is indicated for the atrial fibrillation/flutter.   Recommendations: 1.  Continue draining the Pleurx catheter daily 2.  Transfuse 2 units packed red blood cells 3.  Physical therapy, ambulate as tolerated 4.  Anticoagulation therapy with apixaban 5.  Outpatient follow-up at the Cancer center as scheduled 10/15/2018 6.  Check PA and lateral chest x-ray and decubitus view to look for evidence of fluid loculation   LOS: 1 day   Betsy Coder, MD   10/09/2018, 8:43 AM

## 2018-10-10 ENCOUNTER — Encounter (HOSPITAL_COMMUNITY): Payer: Self-pay | Admitting: Radiology

## 2018-10-10 ENCOUNTER — Inpatient Hospital Stay (HOSPITAL_COMMUNITY): Payer: Medicare Other

## 2018-10-10 DIAGNOSIS — I509 Heart failure, unspecified: Secondary | ICD-10-CM

## 2018-10-10 DIAGNOSIS — I4892 Unspecified atrial flutter: Principal | ICD-10-CM

## 2018-10-10 DIAGNOSIS — D638 Anemia in other chronic diseases classified elsewhere: Secondary | ICD-10-CM

## 2018-10-10 DIAGNOSIS — D649 Anemia, unspecified: Secondary | ICD-10-CM

## 2018-10-10 HISTORY — PX: IR INSTILL VIA CHEST TUBE AGENT FOR FIBRINOLYSIS INI DAY: IMG5400

## 2018-10-10 LAB — BASIC METABOLIC PANEL
Anion gap: 10 (ref 5–15)
BUN: 9 mg/dL (ref 8–23)
CHLORIDE: 90 mmol/L — AB (ref 98–111)
CO2: 36 mmol/L — ABNORMAL HIGH (ref 22–32)
Calcium: 8.1 mg/dL — ABNORMAL LOW (ref 8.9–10.3)
Creatinine, Ser: 0.56 mg/dL — ABNORMAL LOW (ref 0.61–1.24)
GFR calc Af Amer: >60 mL/min (ref >60)
GFR calc non Af Amer: >60 mL/min (ref >60)
Glucose, Bld: 139 mg/dL — ABNORMAL HIGH (ref 70–99)
Potassium: 2.8 mmol/L — ABNORMAL LOW (ref 3.5–5.1)
Sodium: 136 mmol/L (ref 135–145)

## 2018-10-10 LAB — CBC WITH DIFFERENTIAL/PLATELET
Abs Immature Granulocytes: 0.08 10*3/uL — ABNORMAL HIGH (ref 0.00–0.07)
Basophils Absolute: 0 10*3/uL (ref 0.0–0.1)
Basophils Relative: 0 %
Eosinophils Absolute: 0.1 10*3/uL (ref 0.0–0.5)
Eosinophils Relative: 1 %
HCT: 32.2 % — ABNORMAL LOW (ref 39.0–52.0)
HEMOGLOBIN: 10 g/dL — AB (ref 13.0–17.0)
Immature Granulocytes: 1 %
LYMPHS PCT: 6 %
Lymphs Abs: 0.5 10*3/uL — ABNORMAL LOW (ref 0.7–4.0)
MCH: 27.6 pg (ref 26.0–34.0)
MCHC: 31.1 g/dL (ref 30.0–36.0)
MCV: 89 fL (ref 80.0–100.0)
Monocytes Absolute: 1.3 10*3/uL — ABNORMAL HIGH (ref 0.1–1.0)
Monocytes Relative: 16 %
Neutro Abs: 6 10*3/uL (ref 1.7–7.7)
Neutrophils Relative %: 76 %
Platelets: 378 10*3/uL (ref 150–400)
RBC: 3.62 MIL/uL — ABNORMAL LOW (ref 4.22–5.81)
RDW: 15.9 % — ABNORMAL HIGH (ref 11.5–15.5)
WBC: 8 10*3/uL (ref 4.0–10.5)
nRBC: 0 % (ref 0.0–0.2)

## 2018-10-10 LAB — MAGNESIUM: Magnesium: 1.9 mg/dL (ref 1.7–2.4)

## 2018-10-10 LAB — APTT
APTT: 39 s — AB (ref 24–36)
aPTT: 33 seconds (ref 24–36)

## 2018-10-10 LAB — BRAIN NATRIURETIC PEPTIDE: B Natriuretic Peptide: 360.9 pg/mL — ABNORMAL HIGH (ref 0.0–100.0)

## 2018-10-10 LAB — POTASSIUM: POTASSIUM: 3.1 mmol/L — AB (ref 3.5–5.1)

## 2018-10-10 LAB — HEPARIN LEVEL (UNFRACTIONATED): Heparin Unfractionated: 0.76 IU/mL — ABNORMAL HIGH (ref 0.30–0.70)

## 2018-10-10 MED ORDER — DILTIAZEM HCL 30 MG PO TABS
30.0000 mg | ORAL_TABLET | Freq: Once | ORAL | Status: AC
  Administered 2018-10-10: 30 mg via ORAL
  Filled 2018-10-10: qty 1

## 2018-10-10 MED ORDER — AMIODARONE HCL 200 MG PO TABS: 400.0000 mg | ORAL_TABLET | Freq: Two times a day (BID) | ORAL | Status: DC

## 2018-10-10 MED ORDER — AMIODARONE LOAD VIA INFUSION
150.0000 mg | Freq: Once | INTRAVENOUS | Status: AC
  Administered 2018-10-10: 150 mg via INTRAVENOUS
  Filled 2018-10-10: qty 83.34

## 2018-10-10 MED ORDER — DILTIAZEM HCL ER COATED BEADS 180 MG PO CP24
180.0000 mg | ORAL_CAPSULE | Freq: Every day | ORAL | Status: DC
  Administered 2018-10-11: 180 mg via ORAL
  Filled 2018-10-10: qty 1

## 2018-10-10 MED ORDER — HEPARIN (PORCINE) 25000 UT/250ML-% IV SOLN
1750.0000 [IU]/h | INTRAVENOUS | Status: DC
  Filled 2018-10-10: qty 250

## 2018-10-10 MED ORDER — POTASSIUM CHLORIDE CRYS ER 20 MEQ PO TBCR
40.0000 meq | EXTENDED_RELEASE_TABLET | ORAL | Status: AC
  Administered 2018-10-10 (×2): 40 meq via ORAL
  Filled 2018-10-10 (×2): qty 2

## 2018-10-10 MED ORDER — AMIODARONE IV BOLUS ONLY 150 MG/100ML
INTRAVENOUS | Status: AC
  Filled 2018-10-10: qty 100

## 2018-10-10 MED ORDER — SODIUM CHLORIDE 0.9 % IV SOLN
Freq: Once | INTRAVENOUS | Status: DC
  Filled 2018-10-10: qty 60

## 2018-10-10 MED ORDER — AMIODARONE IV BOLUS ONLY 150 MG/100ML
150.0000 mg | Freq: Once | INTRAVENOUS | Status: AC
  Administered 2018-10-10: 150 mg via INTRAVENOUS
  Filled 2018-10-10: qty 100

## 2018-10-10 MED ORDER — AMIODARONE HCL 200 MG PO TABS
400.0000 mg | ORAL_TABLET | Freq: Two times a day (BID) | ORAL | Status: DC
  Administered 2018-10-10 – 2018-10-11 (×3): 400 mg via ORAL
  Filled 2018-10-10 (×3): qty 2

## 2018-10-10 MED ORDER — POTASSIUM CHLORIDE CRYS ER 20 MEQ PO TBCR
40.0000 meq | EXTENDED_RELEASE_TABLET | Freq: Every day | ORAL | Status: DC
  Administered 2018-10-10: 40 meq via ORAL
  Filled 2018-10-10: qty 2

## 2018-10-10 NOTE — Progress Notes (Addendum)
[facility]                                                                              Patient Demographics  Brian Ramirez, is a 73 y.o. male, DOB - Mar 01, 1946, RXY:[id number]  Admit date - 10/08/2018   Admitting Physician Bonnielee Haff, MD  Outpatient Primary MD for the patient is Koirala, Dibas, MD  Outpatient specialists:   LOS - 2  days   Medical records reviewed and are as summarized below:    Chief Complaint  Patient presents with  . Tachycardia       Brief summary   Patient is a pleasant 73 year old gentleman history of recently diagnosed metastatic non-small cell lung cancer 1 cycle of systemic chemotherapy, history of large right malignant pleural effusion with bleeding status post right Pleurx catheter placement which per patient usually drains bloody fluid, anemia of chronic disease, moderate protein calorie malnutrition.  Patient states right Pleurx catheter has not been drained in 2 days at the skilled nursing facility where he was at as they had tried to drain it with no output and he felt they attempted it wrongly.  Patient had presented to his oncologist office for blood work noted to have a hemoglobin of 7.2 was getting a unit of packed red blood cell transfusion when noted to have heart rates in the 150s and noted need to be in A. fib.  Patient had no response to Cardizem infusion as well as adenosine.  Patient sent to the ED.  Patient noted to have flutter waves.  TSH was done within normal limits.  2D echo ordered.  Patient started on Eliquis in discussion with oncology and due to increased risk of stroke. Patient seems to be going in and out of atrial fibrillation.  Cardiology following  Assessment & Plan    Principal Problem:   Atrial flutter with rapid ventricular response (HCC) -Patient in and out of atrial fibrillation, this morning back in A. fib heart rate up to 150, during examination converted back into normal sinus  rhythm -Cardiology following, will await recommendations regarding rhythm control with p.o. amiodarone, had required short acting Cardizem this morning -Anticoagulation per cardiology, Eliquis currently on hold due to planned exchange Pleurx catheter in a.m. -2D echo showed EF of 55%, no regional wall motion abnormalities   Active problems Metastatic non-small cell lung CA -Status post 1 cycle of chemo on 09/28/2018, per oncology  Acute blood loss anemia with history of anemia of chronic disease -Patient noted to have bleeding into the right pleural space, hemoglobin 8.1 on admission -Transfused on 2/25, hemoglobin stable 10.0 today -Currently on heparin drip, no acute issues.  Eliquis on hold  Bilateral lower extremity edema/acute diastolic CHF exacerbation likely precipitated due to A. fib with RVR -Currently appears euvolemic, on IV Lasix, cardiology following - will defer to cardiology regarding transition to oral Lasix   Malignant right-sided pleural effusion with Pleurx catheter -IR consulted, Pleurx catheter exchange on 2/27 -Continue heparin drip for now, anticoagulation decision per cardiology.  Severe protein calorie malnutrition -Patient received IV albumin   Code Status: Full CODE STATUS DVT Prophylaxis: IV heparin drip Family  Communication: Discussed in detail with the patient, all imaging results, lab results explained to the patient    Disposition Plan: Awaiting Pleurx catheter exchange, DC likely after that  Time Spent in minutes   35 minutes  Procedures:  2D echo  Consultants:   Cardiology Interventional radiology  Antimicrobials:   Anti-infectives (From admission, onward)   None          Medications  Scheduled Meds: . amiodarone  400 mg Oral BID  . [START ON 10/11/2018] diltiazem  180 mg Oral Daily  . docusate sodium  100 mg Oral BID  . feeding supplement (ENSURE ENLIVE)  237 mL Oral BID BM  . folic acid  1 mg Oral Daily  . furosemide   40 mg Intravenous Q6H  . guaiFENesin  1,200 mg Oral BID  . mouth rinse  15 mL Mouth Rinse BID  . multivitamin with minerals  1 tablet Oral Daily  . sodium chloride flush  3 mL Intravenous Q12H   Continuous Infusions: . sodium chloride    . heparin 1,350 Units/hr (10/10/18 1227)  . sodium chloride 0.9 % 60 mL with alteplase (CATHFLO ACTIVASE) 8 mg infusion     PRN Meds:.sodium chloride, acetaminophen **OR** acetaminophen, albuterol, ondansetron **OR** ondansetron (ZOFRAN) IV, polyethylene glycol, sodium chloride flush, traMADol      Subjective:   Brian Ramirez was seen and examined today.  Back in atrial fibrillation with RVR, per patient does not feel it.  No chest pain or shortness of breath. Patient denies dizziness,  abdominal pain, N/V/D/C, new weakness, numbess, tingling. No fevers  Objective:   Vitals:   10/10/18 0802 10/10/18 1000 10/10/18 1100 10/10/18 1200  BP:  117/70 (!) 118/56 (!) 121/58  Pulse:  90 92 98  Resp:  (!) 21 17 (!) 28  Temp: (!) 97.5 F (36.4 C)     TempSrc: Oral     SpO2:  96% 98% 97%  Weight:      Height:        Intake/Output Summary (Last 24 hours) at 10/10/2018 1228 Last data filed at 10/10/2018 1227 Gross per 24 hour  Intake 3676.02 ml  Output 4360 ml  Net -683.98 ml     Wt Readings from Last 3 Encounters:  10/10/18 77.9 kg  09/19/18 72.3 kg  09/19/18 72.3 kg     Exam  General: Alert and oriented x 3, NAD  Eyes:   HEENT:  Atraumatic, normocephalic, normal oropharynx  Cardiovascular: S1 S2 auscultated, irregularly irregular, tachycardia  Respiratory: Decreased breath sound at the bases  Gastrointestinal: Soft, nontender, nondistended, + bowel sounds  Ext: no pedal edema bilaterally  Neuro: No new deficits  Musculoskeletal: No digital cyanosis, clubbing  Skin: No rashes  Psych: Normal affect and demeanor, alert and oriented x3    Data Reviewed:  I have personally reviewed following labs and imaging  studies  Micro Results Recent Results (from the past 240 hour(s))  MRSA PCR Screening     Status: None   Collection Time: 10/08/18  6:28 PM  Result Value Ref Range Status   MRSA by PCR NEGATIVE NEGATIVE Final    Comment:        The GeneXpert MRSA Assay (FDA approved for NASAL specimens only), is one component of a comprehensive MRSA colonization surveillance program. It is not intended to diagnose MRSA infection nor to guide or monitor treatment for MRSA infections. Performed at [facility], [place] [address], [place], [place] [address]     Radiology Reports  Dg Chest 2 View  Result Date: 10/09/2018 CLINICAL DATA:  Cancer patient, f/u RIGHT sided pleural effusion. EXAM: CHEST - 2 VIEW COMPARISON:  Chest x-rays dated 10/08/2018 and 09/19/2018. Chest CT dated 09/04/2018. FINDINGS: Large RIGHT pleural effusion, stable. Persistent masslike opacity at the RIGHT lung apex, corresponding to the anterior RIGHT upper lobe mass described on earlier chest CT. Persistent perihilar edema, RIGHT greater than LEFT. LEFT lower lobe pulmonary nodules better demonstrated on earlier chest CT. Heart size and mediastinal contours appear stable. Chest tube at the RIGHT lung base. IMPRESSION: 1. Stable large RIGHT pleural effusion and RIGHT apical mass. Persistent perihilar edema, RIGHT greater than LEFT. 2. Chest tube at the RIGHT lung base. Electronically Signed   By: Franki Cabot M.D.   On: 10/09/2018 12:37   Dg Chest 2 View  Result Date: 09/19/2018 CLINICAL DATA:  Dyspnea. History of lung cancer with recurrent RIGHT pleural effusion, PleurX catheter placed 1 week ago. EXAM: CHEST - 2 VIEW COMPARISON:  Chest radiograph September 11, 2018 FINDINGS: Slightly decreased residual moderate to large RIGHT pleural effusion with pleural vac catheter projecting in RIGHT lung base. Interstitial prominence with scattered nodular densities. RIGHT upper lobe density at site of known mass. Cardiac silhouette  predominantly obscured. Calcified aortic arch. No pneumothorax. IMPRESSION: 1. Moderate to large residual RIGHT pleural effusion with pleural vac catheter in place. 2.  Aortic Atherosclerosis (ICD10-I70.0). Electronically Signed   By: Elon Alas M.D.   On: 09/19/2018 16:56   Dg Chest Bilateral Decubitus  Result Date: 10/09/2018 CLINICAL DATA:  Cancer patient, f/u RIGHT sided pleural effusion. EXAM: CHEST - BILATERAL DECUBITUS VIEW COMPARISON:  Chest x-ray dated 10/08/2018. FINDINGS: Large RIGHT-sided pleural effusion, without significant change on the LEFT lateral decubitus view indicating loculation. No LEFT-sided effusion identified. Chest tube in place at the RIGHT lung base IMPRESSION: Large RIGHT pleural effusion appears to be loculated, given the lack of change in configuration on the bilateral decubitus views. Chest tube in place at the RIGHT lung base. Electronically Signed   By: Franki Cabot M.D.   On: 10/09/2018 12:41   Dg Chest Port 1 View  Result Date: 10/08/2018 CLINICAL DATA:  Shortness of breath EXAM: PORTABLE CHEST 1 VIEW COMPARISON:  09/19/2010 FINDINGS: There is a large right pleural effusion. There is bilateral interstitial thickening. There is no left pleural effusion. There is no pneumothorax. There is a right apical pulmonary mass. The heart size is stable. There is no acute osseous abnormality. IMPRESSION: 1. Large right pleural effusion. Persistent right apical pulmonary mass. 2. Bilateral diffuse interstitial thickening which may reflect mild interstitial edema. Electronically Signed   By: Kathreen Devoid   On: 10/08/2018 12:41   Dg Chest Port 1 View  Result Date: 09/11/2018 CLINICAL DATA:  Increased shortness of breath EXAM: PORTABLE CHEST 1 VIEW COMPARISON:  Two days ago FINDINGS: Progressive and large right pleural effusion with near right chest white out. There is leftward mediastinal shift. Stable patchy opacity in the left lung where there are nodules by CT.  IMPRESSION: 1. Large and increased right pleural effusion with mild mediastinal shift. 2. Known nodularity in the left lung. Electronically Signed   By: Monte Fantasia M.D.   On: 09/11/2018 06:07   Vas [place] Lower Extremity Venous (dvt)  Result Date: 09/19/2018  Lower Venous Study Indications: Edema.  Performing Technologist: June Leap RDMS, RVT  Examination Guidelines: A complete evaluation includes B-mode imaging, spectral Doppler, color Doppler, and power Doppler as needed of all accessible portions of  each vessel. Bilateral testing is considered an integral part of a complete examination. Limited examinations for reoccurring indications may be performed as noted.  Right Venous Findings: +---------+---------------+---------+-----------+----------+-------+          CompressibilityPhasicitySpontaneityPropertiesSummary +---------+---------------+---------+-----------+----------+-------+ CFV      Full           Yes      Yes                          +---------+---------------+---------+-----------+----------+-------+ SFJ      Full                                                 +---------+---------------+---------+-----------+----------+-------+ FV Prox  Full                                                 +---------+---------------+---------+-----------+----------+-------+ FV Mid   Full                                                 +---------+---------------+---------+-----------+----------+-------+ FV DistalFull                                                 +---------+---------------+---------+-----------+----------+-------+ PFV      Full                                                 +---------+---------------+---------+-----------+----------+-------+ POP      Full           Yes      Yes                          +---------+---------------+---------+-----------+----------+-------+ PTV      Full                                                  +---------+---------------+---------+-----------+----------+-------+ PERO     Full                                                 +---------+---------------+---------+-----------+----------+-------+  Left Venous Findings: +---------+---------------+---------+-----------+----------+-------+          CompressibilityPhasicitySpontaneityPropertiesSummary +---------+---------------+---------+-----------+----------+-------+ CFV      Full           Yes      Yes                          +---------+---------------+---------+-----------+----------+-------+ SFJ      Full                                                 +---------+---------------+---------+-----------+----------+-------+  FV Prox  Full                                                 +---------+---------------+---------+-----------+----------+-------+ FV Mid   Full                                                 +---------+---------------+---------+-----------+----------+-------+ FV DistalFull                                                 +---------+---------------+---------+-----------+----------+-------+ PFV      Full                                                 +---------+---------------+---------+-----------+----------+-------+ POP      Full           Yes      Yes                          +---------+---------------+---------+-----------+----------+-------+ PTV      Full                                                 +---------+---------------+---------+-----------+----------+-------+ PERO     Full                                                 +---------+---------------+---------+-----------+----------+-------+    Summary: Right: There is no evidence of deep vein thrombosis in the lower extremity. No cystic structure found in the popliteal fossa. Left: There is no evidence of deep vein thrombosis in the lower extremity. No cystic structure found in the popliteal fossa.  *See  table(s) above for measurements and observations. Electronically signed by Curt Jews MD on 09/19/2018 at 9:42:06 PM.    Final    Ir Perc Pleural Drain Genia Harold Cath W/img Guide  Result Date: 09/11/2018 INDICATION: Malignant right pleural effusion EXAM: ULTRASOUND AND FLUOROSCOPIC RIGHT CHEST TUNNELED PLEURAL DRAIN (PLEURX CATHETER) MEDICATIONS: The patient is currently admitted to the hospital and receiving intravenous antibiotics. The antibiotics were administered within an appropriate time frame prior to the initiation of the procedure. ANESTHESIA/SEDATION: Fentanyl 100 mcg IV; Versed 2.0 mg IV Moderate Sedation Time:  11 MINUTES The patient was continuously monitored during the procedure by the interventional radiology nurse under my direct supervision. COMPLICATIONS: None immediate. PROCEDURE: Informed written consent was obtained from the patient after a thorough discussion of the procedural risks, benefits and alternatives. All questions were addressed. Maximal Sterile Barrier Technique was utilized including caps, mask, sterile gowns, sterile gloves, sterile drape, hand hygiene and skin antiseptic. A timeout was performed prior to the initiation of the procedure. previous imaging reviewed. preliminary ultrasound  performed. the complex loculated right pleural effusion was marked in the mid axillary line through a lower intercostal space. under sterile conditions and local anesthesia, ultrasound percutaneous access performed of the right chest. needle position confirmed with ultrasound. images obtained for documentation. amplatz guidewire inserted. the pleurx catheter was tunneled subcutaneously to the puncture site and advanced into the chest through a peel-away sheath. position confirmed with ultrasound and fluoroscopy. 1.2 l thoracentesis performed following insertion. catheter secured with prolene suture. entry site closed with subcuticular vicryl suture and dermabond. no immediate complication. patient  tolerated the procedure well. IMPRESSION: Successful ultrasound fluoroscopic right chest tunneled pleural drain (PleurX catheter). 1.2 L thoracentesis performed after insertion. Electronically Signed   By: Jerilynn Mages.  Shick M.D.   On: 09/11/2018 17:30    Lab Data:  CBC: Recent Labs  Lab 10/08/18 0847 10/08/18 1237 10/09/18 0241 10/09/18 1254 10/10/18 0806  WBC 8.1 8.3 7.8  --  8.0  NEUTROABS 6.2  --   --   --  6.0  HGB 7.2* 8.1* 7.2* 7.7* 10.0*  HCT 23.8* 27.5* 24.9* 24.7* 32.2*  MCV 87.8 90.8 91.2  --  89.0  PLT 396 390 389  --  335   Basic Metabolic Panel: Recent Labs  Lab 10/08/18 1237 10/09/18 0241 10/10/18 0806  NA 134* 133* 136  K 3.6 3.8 2.8*  CL 97* 98 90*  CO2 27 27 36*  GLUCOSE 93 81 139*  BUN 10 10 9   CREATININE 0.56* 0.46* 0.56*  CALCIUM 7.8* 7.8* 8.1*  MG 1.7 1.8 1.9   GFR: Estimated Creatinine Clearance: 93.3 mL/min (A) (by C-G formula based on SCr of 0.56 mg/dL (L)). Liver Function Tests: Recent Labs  Lab 10/09/18 0241  AST 24  ALT 21  ALKPHOS 88  BILITOT 1.0  PROT 5.5*  ALBUMIN 1.5*   No results for input(s): LIPASE, AMYLASE in the last 168 hours. No results for input(s): AMMONIA in the last 168 hours. Coagulation Profile: No results for input(s): INR, PROTIME in the last 168 hours. Cardiac Enzymes: No results for input(s): CKTOTAL, CKMB, CKMBINDEX, TROPONINI in the last 168 hours. BNP (last 3 results) No results for input(s): PROBNP in the last 8760 hours. HbA1C: No results for input(s): HGBA1C in the last 72 hours. CBG: No results for input(s): GLUCAP in the last 168 hours. Lipid Profile: No results for input(s): CHOL, HDL, LDLCALC, TRIG, CHOLHDL, LDLDIRECT in the last 72 hours. Thyroid Function Tests: Recent Labs    10/08/18 1237  TSH 1.509   Anemia Panel: No results for input(s): VITAMINB12, FOLATE, FERRITIN, TIBC, IRON, RETICCTPCT in the last 72 hours. Urine analysis:    Component Value Date/Time   COLORURINE YELLOW 09/22/2018  1509   APPEARANCEUR HAZY (A) 09/22/2018 1509   LABSPEC 1.021 09/22/2018 1509   PHURINE 6.0 09/22/2018 1509   GLUCOSEU NEGATIVE 09/22/2018 1509   HGBUR MODERATE (A) 09/22/2018 1509   BILIRUBINUR MODERATE (A) 09/22/2018 1509   KETONESUR 5 (A) 09/22/2018 1509   PROTEINUR 30 (A) 09/22/2018 1509   NITRITE NEGATIVE 09/22/2018 1509   LEUKOCYTESUR NEGATIVE 09/22/2018 1509     Cadell Gabrielson M.D. [facility] 10/10/2018, 12:28 PM  Pager: [phone] Between 7am to 7pm - call Pager - [phone]  After 7pm go to [email or web address] - password [email or web address]  Call night coverage person covering after 7pm

## 2018-10-10 NOTE — Progress Notes (Signed)
[facility]ann for IV Heparin Indication: atrial fibrillation bridging anticoagulation while Eliquis on hold for procedure planned for 2/27  Heparin Dosing Weight: actual body weight  Labs: Recent Labs    10/08/18 1237 10/09/18 0241 10/09/18 1254 10/09/18 1937 10/10/18 0806  HGB 8.1* 7.2* 7.7*  --  10.0*  HCT 27.5* 24.9* 24.7*  --  32.2*  PLT 390 389  --   --  378  APTT  --   --   --  42* 33  HEPARINUNFRC  --   --   --  1.62* 0.76*  CREATININE 0.56* 0.46*  --   --  0.56*   Medications:  Eliquis 5mg  BID started 2/24 for new onset AFib  Assessment: 73 yr male with metastatic NSCLC, undergoing chemotherapy, with recent pleural catheter placement for malignant right pleural effusion. Admitted on 2/24 with new onset AFib. Initially started on Eliquis; converted to heparin with possible pleural cath procedure needed.  Today 10/10/18   HL remains "falsely" elevated  APTT below goal but improved  No line issues per RN, infusion not paused  No further bleeding reported; pleural fluid noted to be a bit bloody, but this seems to be baseline per patient.   TPA catheter dwell successful in unclogging catheter  Goal of Therapy:  Heparin level 0.3-0.7 APTT 66-102 sec Monitor platelets by anticoagulation protocol: Yes   Plan:   Increase heparin infusion to 1600 units/hr  Repeat aPTT in 8 hours  VATS/Cathether exchange no longer required for the time being  Reuel Boom, PharmD, BCPS [phone] 10/10/2018, 5:48 PM

## 2018-10-10 NOTE — Progress Notes (Addendum)
Progress Note  Patient Name: Brian Ramirez Date of Encounter: 10/10/2018  Primary Cardiologist: [name], MD   Subjective   Denies any CP or SOB. No cardiac awareness of aflutter  Inpatient Medications    Scheduled Meds: . diltiazem  120 mg Oral Daily  . docusate sodium  100 mg Oral BID  . feeding supplement (ENSURE ENLIVE)  237 mL Oral BID BM  . folic acid  1 mg Oral Daily  . furosemide  40 mg Intravenous Q6H  . guaiFENesin  1,200 mg Oral BID  . mouth rinse  15 mL Mouth Rinse BID  . multivitamin with minerals  1 tablet Oral Daily  . sodium chloride flush  3 mL Intravenous Q12H   Continuous Infusions: . sodium chloride    . heparin 1,100 Units/hr (10/09/18 2224)   PRN Meds: sodium chloride, acetaminophen **OR** acetaminophen, albuterol, ondansetron **OR** ondansetron (ZOFRAN) IV, polyethylene glycol, sodium chloride flush, traMADol   Vital Signs    Vitals:   10/10/18 0411 10/10/18 0500 10/10/18 0600 10/10/18 0700  BP: 112/62 (!) 131/57 (!) 119/56 (!) 116/52  Pulse: (!) 117 (!) 146 84 87  Resp: (!) 28 17 17 16   Temp: 98.5 F (36.9 C)     TempSrc: Oral     SpO2: 97% 97% 97% 99%  Weight:      Height:        Intake/Output Summary (Last 24 hours) at 10/10/2018 0826 Last data filed at 10/10/2018 0802 Gross per 24 hour  Intake 3835.46 ml  Output 4610 ml  Net -774.54 ml   Last 3 Weights 10/10/2018 10/09/2018 10/08/2018  Weight (lbs) 171 lb 11.8 oz 167 lb 12.3 oz 165 lb 5.5 oz  Weight (kg) 77.9 kg 76.1 kg 75 kg      Telemetry    Back and forth between atrial flutter and sinus rhythm - Personally Reviewed  ECG    Last EKG was in sinus on 10/09/2018 - Personally Reviewed  Physical Exam   GEN: No acute distress.   Neck: No JVD Cardiac: irregular, no murmurs, rubs, or gallops.  Respiratory: Clear to auscultation bilaterally, except for rhonchi in the bases. GI: Soft, nontender, non-distended  MS: No edema; No deformity. Neuro:  Nonfocal  Psych:  Normal affect   Labs    Chemistry Recent Labs  Lab 10/08/18 1237 10/09/18 0241  NA 134* 133*  K 3.6 3.8  CL 97* 98  CO2 27 27  GLUCOSE 93 81  BUN 10 10  CREATININE 0.56* 0.46*  CALCIUM 7.8* 7.8*  PROT  --  5.5*  ALBUMIN  --  1.5*  AST  --  24  ALT  --  21  ALKPHOS  --  88  BILITOT  --  1.0  GFRNONAA >60 >60  GFRAA >60 >60  ANIONGAP 10 8     Hematology Recent Labs  Lab 10/08/18 0847 10/08/18 1237 10/09/18 0241 10/09/18 1254  WBC 8.1 8.3 7.8  --   RBC 2.71* 3.03* 2.73*  --   HGB 7.2* 8.1* 7.2* 7.7*  HCT 23.8* 27.5* 24.9* 24.7*  MCV 87.8 90.8 91.2  --   MCH 26.6 26.7 26.4  --   MCHC 30.3 29.5* 28.9*  --   RDW 17.3* 17.0* 16.9*  --   PLT 396 390 389  --     Cardiac EnzymesNo results for input(s): TROPONINI in the last 168 hours. No results for input(s): TROPIPOC in the last 168 hours.   BNP Recent Labs  Lab  10/09/18 0241  BNP 244.0*     DDimer No results for input(s): DDIMER in the last 168 hours.   Radiology    Dg Chest 2 View  Result Date: 10/09/2018 CLINICAL DATA:  Cancer patient, f/u RIGHT sided pleural effusion. EXAM: CHEST - 2 VIEW COMPARISON:  Chest x-rays dated 10/08/2018 and 09/19/2018. Chest CT dated 09/04/2018. FINDINGS: Large RIGHT pleural effusion, stable. Persistent masslike opacity at the RIGHT lung apex, corresponding to the anterior RIGHT upper lobe mass described on earlier chest CT. Persistent perihilar edema, RIGHT greater than LEFT. LEFT lower lobe pulmonary nodules better demonstrated on earlier chest CT. Heart size and mediastinal contours appear stable. Chest tube at the RIGHT lung base. IMPRESSION: 1. Stable large RIGHT pleural effusion and RIGHT apical mass. Persistent perihilar edema, RIGHT greater than LEFT. 2. Chest tube at the RIGHT lung base. Electronically Signed   By: Franki Cabot M.D.   On: 10/09/2018 12:37   Dg Chest Bilateral Decubitus  Result Date: 10/09/2018 CLINICAL DATA:  Cancer patient, f/u RIGHT sided pleural  effusion. EXAM: CHEST - BILATERAL DECUBITUS VIEW COMPARISON:  Chest x-ray dated 10/08/2018. FINDINGS: Large RIGHT-sided pleural effusion, without significant change on the LEFT lateral decubitus view indicating loculation. No LEFT-sided effusion identified. Chest tube in place at the RIGHT lung base IMPRESSION: Large RIGHT pleural effusion appears to be loculated, given the lack of change in configuration on the bilateral decubitus views. Chest tube in place at the RIGHT lung base. Electronically Signed   By: Franki Cabot M.D.   On: 10/09/2018 12:41   Dg Chest Port 1 View  Result Date: 10/08/2018 CLINICAL DATA:  Shortness of breath EXAM: PORTABLE CHEST 1 VIEW COMPARISON:  09/19/2010 FINDINGS: There is a large right pleural effusion. There is bilateral interstitial thickening. There is no left pleural effusion. There is no pneumothorax. There is a right apical pulmonary mass. The heart size is stable. There is no acute osseous abnormality. IMPRESSION: 1. Large right pleural effusion. Persistent right apical pulmonary mass. 2. Bilateral diffuse interstitial thickening which may reflect mild interstitial edema. Electronically Signed   By: Kathreen Devoid   On: 10/08/2018 12:41    Cardiac Studies   Echo 10/09/2018 1. The left ventricle has a visually estimated ejection fraction of of 55%. The cavity size was normal. Left ventricular diastolic parameters were normal No evidence of left ventricular regional wall motion abnormalities.  2. The right ventricle has normal systolic function. The cavity was normal. There is no increase in right ventricular wall thickness.  3. Left atrial size was moderately dilated.  4. The tricuspid valve is normal in structure.  5. The aortic valve is tricuspid Mild calcification of the aortic valve. no stenosis of the aortic valve.  6. The pulmonic valve was normal in structure.  7. The aortic root and ascending aorta are normal in size and structure.  8. The mitral valve is  normal in structure. No evidence of mitral valve stenosis. Trivial mitral regurgitation.  9. Normal IVC size. PA systolic pressure 37 mmHg.  Patient Profile     73 y.o. male with non small cell lung cancer, R pleural effusion s/p pleurx catheter, anemia found to have new atrial flutter with RVR  Assessment & Plan    1. Atrial flutter: started on eliquis. Converted on amiodarone. Start on oral amiodarone today  - eliquis currently on hold due to need to exchange pleurx catheter on Thur. Will need to think about the duration of eliquis.   - Echo 10/09/2018  showed normal EF. No significant valvular issue  - his aflutter is likely exacerbated by anemia and cancer. CHA2DS2-Vasc score 1 (age)  - likely consider short term eliquis for 4 weeks if able to maintain sinus rhythm, then stop and focus on rhythm control using oral amiodarone only. He is at high risk for bleeding given the need for multiple blood transfusion and bloody drainage from pleurx catheter  2. Metastatic lung cancer receiving chemo  3. Large R pleural effusion s/p pleurx catheter: clotted off based on interventional radiology note, plan for exchange catheter tomorrow after holding eliquis  - he appears to be euvolemic on exam. Consider switch to 40mg  daily PO lasix instead.   4. Hypoalbuminemia: received albumin  5. Anemia: received multiple blood transfusion. Bloody drainage from pleurx catheter  6. Hypokalemia: 49meq KCl x 2  For questions or updates, please contact [place] Please consult [email or web address] for contact info under     Signed, Almyra Deforest, [place]  10/10/2018, 8:26 AM    The patient was seen, examined and discussed with Almyra Deforest, PA-C and I agree with the above.   73 y.o. male with a hx of malignant neoplasm of Rt lung - metastatic non small cell lung cancer on Keytruda and anemia with hgb yesterday of 7.2.  He was being transfused when he was found to be in a fib with RVR at 175.  He was sent to ER from Chemo  area.  Also hx of large Rt malignant pl effusion with bleeding s/p pleurx catheter.   Initially EKG revealed A fib but with dilt drip no response and adenosine was given with some slowing flutter waves could be seen.  Pt without chest pain or SOB, pt was aware of rapid HR started in middle of blood transfusion.  Cardiology was consulted and amiodarone was to be started but he converted with Dilt so amio held.   The patient has no prior history of cardiac disease and no arrhythmias.  At this point I would discontinue amiodarone given underlying lung cancer, echo showed normal LVEF, moderately dilated left atrium.  He remains in SR with 2 episodes of a-fib with RVR this am, we will increase cardizem CD to 180 mg po daily. Good response to diuresis with significant improvement of his LE edema, I would continue iv Lasix till tomorrow at Q12 H, Crea is normal , replace K.   With regards to anticoagulation it seems that decision has been made for Eliquis 5 mg p.o. twice daily, hemoglobin has to be followed closely. CHA2DS2Vasc of 1 but with cancer this may increase risk of stroke and plan will be to convert to SR.  He continue with Pleurex catheter and will monitor for increase blood loss.    [name], MD 10/10/2018

## 2018-10-10 NOTE — Progress Notes (Signed)
HR spiked to 150s, will give another short acting diltiazem and change maintenance therapy to 180mg  diltiazem CD starting tomorrow. Will reassess HR later.

## 2018-10-10 NOTE — Progress Notes (Signed)
Patient ID: Brian Ramirez, male   DOB: [birth year], 73 y.o.   MRN: [id number]  Following d/w Drs. Yamagata/Sherrill, decision made to try TPA dwell into rt pleurx drain before considering exchange. 8mg  TPA in 60 cc NS instilled into rt pleurx without immediate complications. Will clamp drain for next 2 hours and reassess for drainage afterwards. Nurse/pt updated.

## 2018-10-10 NOTE — Progress Notes (Signed)
IP PROGRESS NOTE  Subjective:   Brian Ramirez denies dyspnea.  He is not aware of tachycardia this morning.  No complaint.  The Pleurx catheter is not draining. Objective: Vital signs in last 24 hours: Blood pressure 137/68, pulse 89, temperature 98.6 F (37 C), temperature source Oral, resp. rate (!) 29, height _0  (1.854 m), weight 165 lb 5.5 oz (75 kg), SpO2 98 %.  Intake/Output from previous day: 02/24 0701 - 02/25 0700 In: 167.2 [I.V.:167.2] Out: 685 [Urine:685]  Physical Exam:  HEENT: No thrush or ulcers Lungs: Decreased breath sounds throughout the right chest Cardiac: Tachycardia, irregular Abdomen: No hepatosplenomegaly, nontender Extremities: No leg edema     Lab Results: Recent Labs    10/08/18 1237 10/09/18 0241  WBC 8.3 7.8  HGB 8.1* 7.2*  HCT 27.5* 24.9*  PLT 390 389   Hemoglobin 10, platelets 378,000, ANC 6.0 BMET Recent Labs    10/08/18 1237 10/09/18 0241  NA 134* 133*  K 3.6 3.8  CL 97* 98  CO2 27 27  GLUCOSE 93 81  BUN 10 10  CREATININE 0.56* 0.46*  CALCIUM 7.8* 7.8*   Creatinine 0.56, potassium 2.8 No results found for: CEA1  Studies/Results: Dg Chest Port 1 View  Result Date: 10/08/2018 CLINICAL DATA:  Shortness of breath EXAM: PORTABLE CHEST 1 VIEW COMPARISON:  09/19/2010 FINDINGS: There is a large right pleural effusion. There is bilateral interstitial thickening. There is no left pleural effusion. There is no pneumothorax. There is a right apical pulmonary mass. The heart size is stable. There is no acute osseous abnormality. IMPRESSION: 1. Large right pleural effusion. Persistent right apical pulmonary mass. 2. Bilateral diffuse interstitial thickening which may reflect mild interstitial edema. Electronically Signed   By: Kathreen Devoid   On: 10/08/2018 12:41    Medications: I have reviewed the patient's current medications.  Assessment/Plan: 1.Metastatic non-small cell lung cancer  largeright pleural effusion, right lung  mass, right pleural-based masses, left lung nodules  Right thoracentesis 09/04/2018-negative cytology  CT biopsy of right lateral pleural mass 09/07/2018-poorly differentiated non-small cell carcinoma, malignant cells positive for cytokeratin AE1/AE3 and cytokeratin 8/18, histology favoring adenocarcinoma, MSS, tumor mutation burden 5.no EGFR, ALK, BRAF alteration  PDL 1 tumor proportion score-100%   Cycle 1 Alimta/carboplatin 09/22/2018, pembrolizumab 09/28/2018  2.Severe anemia-likely secondary to bleeding in the right pleural space and metastatic carcinoma, red cell transfusions 09/09/2018 09/10/2018, 09/23/2018 3.Dyspnea secondary to the large right pleural effusion and anemia  Right Pleurx catheter placed 09/11/2018  Chest x-ray 10/09/2018- large right pleural effusion, loculated 4.History of tobacco use 5.History of kidney stones 6.  Fever-most likely tumor fever, improved 7.  Admission 10/08/2018 with rapid atrial fibrillation/flutter- improved with diltiazem, started on Eliquis anticoagulation  Mr. Emmick appears unchanged.  He continues to have an arrhythmia.  The right pleural effusion is loculated.  I will discuss the case with interventional radiology to decide on repositioning the Pleurx catheter versus a referral to CVTS.  The hemoglobin is higher after the red cell transfusion yesterday.   Recommendations: 1.  Evaluate Pleurx catheter for repositioning versus CVTS consult for a VATS 2.  Anticoagulation therapy per cardiology 3.  Physical therapy, ambulate as tolerated 4.  Outpatient follow-up at the Cancer center as scheduled 10/15/2018    LOS: 1 day   Betsy Coder, MD   10/09/2018, 8:43 AM

## 2018-10-10 NOTE — Progress Notes (Signed)
Patient ID: Brian Ramirez, male   DOB: Oct 15, 1945, 73 y.o.   MRN: [id number] Approx 1 liter of bloody fluid was aspirated from pt's rt pleurx following TPA dwell; cont with prn drainage for now and should problem arise again consider repeat TPA use (confirm residual pleural effusion with CXR or [place] before initiating any future TPA use however).

## 2018-10-10 NOTE — Progress Notes (Signed)
ANTICOAGULATION CONSULT NOTE - Initial Consult  Pharmacy Consult for IV Heparin Indication: atrial fibrillation bridging anticoagulation while Eliquis on hold for procedure planned for 2/27  No Known Allergies  Patient Measurements: Height: 6\' 1"  (185.4 cm) Weight: 171 lb 11.8 oz (77.9 kg) IBW/kg (Calculated) : 79.9 Heparin Dosing Weight: actual body weight  Vital Signs: Temp: 98.5 F (36.9 C) (02/26 0411) Temp Source: Oral (02/26 0411) BP: 116/52 (02/26 0700) Pulse Rate: 87 (02/26 0700)  Labs: Recent Labs    10/08/18 1237 10/09/18 0241 10/09/18 1254 10/09/18 1937 10/10/18 0806  HGB 8.1* 7.2* 7.7*  --  10.0*  HCT 27.5* 24.9* 24.7*  --  32.2*  PLT 390 389  --   --  378  APTT  --   --   --  42* 33  HEPARINUNFRC  --   --   --  1.62* 0.76*  CREATININE 0.56* 0.46*  --   --  0.56*    Estimated Creatinine Clearance: 93.3 mL/min (A) (by C-G formula based on SCr of 0.56 mg/dL (L)).   Medical History: Past Medical History:  Diagnosis Date  . Hernia 2012  . History of hiatal hernia   . History of kidney stones   . Personal history of kidney stones 1992    Medications:  Eliquis 5mg  BID started 2/24 for new onset AFib  Assessment:  73 yr male with metastatic NSCLC, undergoing chemotherapy, with recent pleural catheter placement for malignant right pleural effusion.  Admitted on 2/24 with new onset AFib.    Patient known to pharmacy from initial Eliquis dosing  Pleural catheter not draining as intended.  IR consulted today  Noted plan for catheter exchange on 2/27  Eliquis order modified to be d/c'ed now and resume 2/28 @ 2200  IR ok'ed bridging with heparin but would need to be held prior to procedure  Notified Dr Grandville Silos of anticoagulation recommendations and received request for pharmacy to manage IV heparin for anticoagulation bridging coverage  Last Eliquis 5mg  dose administered @ 09:03 today  Today 10/10/18   HL remains "falsely" elevated  APTT below  goal  No line issues per RN, infusion not paused  No bleeding reported outside of initial reports of bleeding form pleural space  Hgb 10 after transfusion  Plans for catheter exchange vs VATS  Goal of Therapy:  Heparin level 0.3-0.7 APTT 66-102 sec Monitor platelets by anticoagulation protocol: Yes   Plan:   Increase heparin infusion to 1350 units/hr  Repeat aPTT in 8 hours  Follow CBC daily  F/U plans for IR vs VATS in order to determine appropriate time to hold infusion  Monitor for bleeding   Thank you, Napoleon Form, PharmD 10/10/2018,8:53 AM

## 2018-10-10 NOTE — Progress Notes (Addendum)
Dr. Karleen Hampshire responded for Dr. Tana Coast and v/o to give po Cardizem early, when assessing pt and giving medication pt went in to SR and Dr. Karleen Hampshire arrived on unit to see rhythm change and stated to give notification if any changes come up. Will continue to monitor   Notified MD of pt's Afib RVR awaiting response

## 2018-10-11 DIAGNOSIS — R279 Unspecified lack of coordination: Secondary | ICD-10-CM | POA: Diagnosis not present

## 2018-10-11 DIAGNOSIS — Z9181 History of falling: Secondary | ICD-10-CM | POA: Diagnosis not present

## 2018-10-11 DIAGNOSIS — C3411 Malignant neoplasm of upper lobe, right bronchus or lung: Secondary | ICD-10-CM | POA: Diagnosis not present

## 2018-10-11 DIAGNOSIS — J90 Pleural effusion, not elsewhere classified: Secondary | ICD-10-CM | POA: Diagnosis not present

## 2018-10-11 DIAGNOSIS — D649 Anemia, unspecified: Secondary | ICD-10-CM | POA: Diagnosis not present

## 2018-10-11 DIAGNOSIS — C3491 Malignant neoplasm of unspecified part of right bronchus or lung: Secondary | ICD-10-CM | POA: Diagnosis not present

## 2018-10-11 DIAGNOSIS — Z7982 Long term (current) use of aspirin: Secondary | ICD-10-CM | POA: Diagnosis not present

## 2018-10-11 DIAGNOSIS — Z87891 Personal history of nicotine dependence: Secondary | ICD-10-CM | POA: Diagnosis not present

## 2018-10-11 DIAGNOSIS — Z743 Need for continuous supervision: Secondary | ICD-10-CM | POA: Diagnosis not present

## 2018-10-11 DIAGNOSIS — C3490 Malignant neoplasm of unspecified part of unspecified bronchus or lung: Secondary | ICD-10-CM | POA: Diagnosis not present

## 2018-10-11 DIAGNOSIS — J910 Malignant pleural effusion: Secondary | ICD-10-CM | POA: Diagnosis not present

## 2018-10-11 DIAGNOSIS — I4892 Unspecified atrial flutter: Secondary | ICD-10-CM | POA: Diagnosis not present

## 2018-10-11 DIAGNOSIS — J9621 Acute and chronic respiratory failure with hypoxia: Secondary | ICD-10-CM | POA: Diagnosis not present

## 2018-10-11 DIAGNOSIS — I509 Heart failure, unspecified: Secondary | ICD-10-CM | POA: Diagnosis not present

## 2018-10-11 DIAGNOSIS — Z5112 Encounter for antineoplastic immunotherapy: Secondary | ICD-10-CM | POA: Diagnosis not present

## 2018-10-11 DIAGNOSIS — Z79899 Other long term (current) drug therapy: Secondary | ICD-10-CM | POA: Diagnosis not present

## 2018-10-11 DIAGNOSIS — Z5111 Encounter for antineoplastic chemotherapy: Secondary | ICD-10-CM | POA: Diagnosis not present

## 2018-10-11 DIAGNOSIS — Z48813 Encounter for surgical aftercare following surgery on the respiratory system: Secondary | ICD-10-CM | POA: Diagnosis not present

## 2018-10-11 DIAGNOSIS — R278 Other lack of coordination: Secondary | ICD-10-CM | POA: Diagnosis not present

## 2018-10-11 DIAGNOSIS — I499 Cardiac arrhythmia, unspecified: Secondary | ICD-10-CM | POA: Diagnosis not present

## 2018-10-11 DIAGNOSIS — J181 Lobar pneumonia, unspecified organism: Secondary | ICD-10-CM | POA: Diagnosis not present

## 2018-10-11 DIAGNOSIS — I5030 Unspecified diastolic (congestive) heart failure: Secondary | ICD-10-CM | POA: Diagnosis not present

## 2018-10-11 DIAGNOSIS — Z602 Problems related to living alone: Secondary | ICD-10-CM | POA: Diagnosis not present

## 2018-10-11 DIAGNOSIS — Z7901 Long term (current) use of anticoagulants: Secondary | ICD-10-CM

## 2018-10-11 DIAGNOSIS — Z9981 Dependence on supplemental oxygen: Secondary | ICD-10-CM | POA: Diagnosis not present

## 2018-10-11 DIAGNOSIS — I480 Paroxysmal atrial fibrillation: Secondary | ICD-10-CM | POA: Diagnosis not present

## 2018-10-11 DIAGNOSIS — E440 Moderate protein-calorie malnutrition: Secondary | ICD-10-CM | POA: Diagnosis not present

## 2018-10-11 DIAGNOSIS — L298 Other pruritus: Secondary | ICD-10-CM | POA: Diagnosis not present

## 2018-10-11 DIAGNOSIS — R5381 Other malaise: Secondary | ICD-10-CM | POA: Diagnosis not present

## 2018-10-11 DIAGNOSIS — I4891 Unspecified atrial fibrillation: Secondary | ICD-10-CM | POA: Diagnosis not present

## 2018-10-11 DIAGNOSIS — R600 Localized edema: Secondary | ICD-10-CM | POA: Diagnosis not present

## 2018-10-11 DIAGNOSIS — R5383 Other fatigue: Secondary | ICD-10-CM | POA: Diagnosis not present

## 2018-10-11 DIAGNOSIS — D638 Anemia in other chronic diseases classified elsewhere: Secondary | ICD-10-CM | POA: Diagnosis not present

## 2018-10-11 DIAGNOSIS — R0602 Shortness of breath: Secondary | ICD-10-CM | POA: Diagnosis not present

## 2018-10-11 DIAGNOSIS — R2681 Unsteadiness on feet: Secondary | ICD-10-CM | POA: Diagnosis not present

## 2018-10-11 DIAGNOSIS — I5031 Acute diastolic (congestive) heart failure: Secondary | ICD-10-CM | POA: Diagnosis not present

## 2018-10-11 LAB — TYPE AND SCREEN
ABO/RH(D): O POS
Antibody Screen: NEGATIVE
Unit division: 0
Unit division: 0
Unit division: 0

## 2018-10-11 LAB — BPAM RBC
Blood Product Expiration Date: 202003212359
Blood Product Expiration Date: 202003212359
Blood Product Expiration Date: 202003242359
ISSUE DATE / TIME: 202002241054
ISSUE DATE / TIME: 202002252158
ISSUE DATE / TIME: 202002260122
Unit Type and Rh: 5100
Unit Type and Rh: 5100
Unit Type and Rh: 5100

## 2018-10-11 LAB — BASIC METABOLIC PANEL
ANION GAP: 8 (ref 5–15)
BUN: 10 mg/dL (ref 8–23)
CO2: 37 mmol/L — ABNORMAL HIGH (ref 22–32)
Calcium: 8.1 mg/dL — ABNORMAL LOW (ref 8.9–10.3)
Chloride: 89 mmol/L — ABNORMAL LOW (ref 98–111)
Creatinine, Ser: 0.58 mg/dL — ABNORMAL LOW (ref 0.61–1.24)
GFR calc non Af Amer: >60 mL/min (ref >60)
Glucose, Bld: 117 mg/dL — ABNORMAL HIGH (ref 70–99)
Potassium: 3.3 mmol/L — ABNORMAL LOW (ref 3.5–5.1)
Sodium: 134 mmol/L — ABNORMAL LOW (ref 135–145)

## 2018-10-11 LAB — CBC
HEMATOCRIT: 32 % — AB (ref 39.0–52.0)
Hemoglobin: 9.7 g/dL — ABNORMAL LOW (ref 13.0–17.0)
MCH: 27.3 pg (ref 26.0–34.0)
MCHC: 30.3 g/dL (ref 30.0–36.0)
MCV: 90.1 fL (ref 80.0–100.0)
Platelets: 386 10*3/uL (ref 150–400)
RBC: 3.55 MIL/uL — ABNORMAL LOW (ref 4.22–5.81)
RDW: 16.2 % — ABNORMAL HIGH (ref 11.5–15.5)
WBC: 7.6 10*3/uL (ref 4.0–10.5)
nRBC: 0 % (ref 0.0–0.2)

## 2018-10-11 LAB — HEPARIN LEVEL (UNFRACTIONATED): Heparin Unfractionated: 0.31 IU/mL (ref 0.30–0.70)

## 2018-10-11 LAB — MAGNESIUM: Magnesium: 1.7 mg/dL (ref 1.7–2.4)

## 2018-10-11 LAB — APTT: aPTT: 45 seconds — ABNORMAL HIGH (ref 24–36)

## 2018-10-11 MED ORDER — APIXABAN 5 MG PO TABS: 5.0000 mg | ORAL_TABLET | Freq: Two times a day (BID) | ORAL | Status: DC

## 2018-10-11 MED ORDER — FUROSEMIDE 40 MG PO TABS
40.0000 mg | ORAL_TABLET | Freq: Two times a day (BID) | ORAL | Status: DC
  Administered 2018-10-11: 40 mg via ORAL
  Filled 2018-10-11: qty 1

## 2018-10-11 MED ORDER — POTASSIUM CHLORIDE CRYS ER 20 MEQ PO TBCR: 20.0000 meq | EXTENDED_RELEASE_TABLET | Freq: Every day | ORAL | Status: DC

## 2018-10-11 MED ORDER — DILTIAZEM HCL ER COATED BEADS 240 MG PO CP24: 240.0000 mg | ORAL_CAPSULE | Freq: Every day | ORAL | Status: DC

## 2018-10-11 MED ORDER — AMIODARONE HCL 200 MG PO TABS: ORAL_TABLET | ORAL | Status: DC

## 2018-10-11 MED ORDER — POTASSIUM CHLORIDE CRYS ER 20 MEQ PO TBCR: 40.0000 meq | EXTENDED_RELEASE_TABLET | Freq: Once | ORAL | Status: DC

## 2018-10-11 MED ORDER — TRAMADOL HCL 50 MG PO TABS: 50.0000 mg | ORAL_TABLET | Freq: Four times a day (QID) | ORAL | 0 refills | Status: DC | PRN

## 2018-10-11 MED ORDER — APIXABAN 5 MG PO TABS
5.0000 mg | ORAL_TABLET | Freq: Two times a day (BID) | ORAL | Status: DC
  Administered 2018-10-11: 5 mg via ORAL
  Filled 2018-10-11: qty 1

## 2018-10-11 MED ORDER — GUAIFENESIN ER 600 MG PO TB12: 600.0000 mg | ORAL_TABLET | Freq: Two times a day (BID) | ORAL | Status: DC

## 2018-10-11 MED ORDER — FUROSEMIDE 40 MG PO TABS: 40.0000 mg | ORAL_TABLET | Freq: Two times a day (BID) | ORAL | Status: DC

## 2018-10-11 MED ORDER — DILTIAZEM HCL ER COATED BEADS 120 MG PO CP24: 240.0000 mg | ORAL_CAPSULE | Freq: Every day | ORAL | Status: DC

## 2018-10-11 MED ORDER — POTASSIUM CHLORIDE CRYS ER 20 MEQ PO TBCR
40.0000 meq | EXTENDED_RELEASE_TABLET | Freq: Every day | ORAL | Status: DC
  Administered 2018-10-11: 40 meq via ORAL
  Filled 2018-10-11: qty 2

## 2018-10-11 MED ORDER — AMIODARONE HCL 200 MG PO TABS
200.0000 mg | ORAL_TABLET | Freq: Two times a day (BID) | ORAL | Status: DC
  Administered 2018-10-11: 200 mg via ORAL
  Filled 2018-10-11: qty 1

## 2018-10-11 MED ORDER — AMIODARONE HCL 200 MG PO TABS: 200.0000 mg | ORAL_TABLET | Freq: Every day | ORAL | Status: DC

## 2018-10-11 MED ORDER — DILTIAZEM HCL ER COATED BEADS 180 MG PO CP24: 180.0000 mg | ORAL_CAPSULE | Freq: Every day | ORAL | Status: DC

## 2018-10-11 NOTE — NC FL2 (Signed)
[place] LEVEL OF CARE SCREENING TOOL     IDENTIFICATION  Patient Name: Brian Ramirez Birthdate: 17-Oct-1945 Sex: male Admission Date (Current Location): 10/08/2018  [facility] and [place] Number:  Herbalist and Address:  [facility],  [place], [place]      Provider Number: [id number]  Attending Physician Name and Address:  Mendel Corning, MD  Relative Name and Phone Number:  Nilesh Stegall, [phone]    Current Level of Care: Hospital Recommended Level of Care: [place] Prior Approval Number:    Date Approved/Denied:   PASRR Number: [id number]  Discharge Plan: SNF    Current Diagnoses: Patient Active Problem List   Diagnosis Date Noted  . Edema of both lower extremities 10/09/2018  . Pleural effusion on right   . Acute congestive heart failure ([place])   . New onset atrial fibrillation ([place])   . Anticoagulated   . Atrial flutter with rapid ventricular response ([place]) 10/08/2018  . Anemia of chronic disease 10/08/2018  . Pressure injury of skin 09/20/2018  . Malnutrition of moderate degree 09/20/2018  . Lung cancer ([place]) 09/20/2018  . Goals of care, counseling/discussion 09/20/2018  . SOB (shortness of breath) 09/19/2018  . Pleural effusion 09/04/2018  . Lung mass 09/04/2018  . Mass of right lung 09/04/2018  . Left inguinal hernia s/p lap repair 11/22/2016 09/26/2016  . Difficult airway for intubation 09/26/2016    Orientation RESPIRATION BLADDER Height & Weight     Self, Time, Situation, Place  O2 Continent Weight: 167 lb 5.3 oz (75.9 kg) Height:  6\' 1"  (185.4 cm)  BEHAVIORAL SYMPTOMS/MOOD NEUROLOGICAL BOWEL NUTRITION STATUS      Continent Diet(Regular )  AMBULATORY STATUS COMMUNICATION OF NEEDS Skin   Limited Assist Verbally Other (Comment)(Has Pleurex Cath)                       Personal Care Assistance Level of Assistance  Bathing, Feeding, Dressing Bathing Assistance:  Limited assistance   Dressing Assistance: Limited assistance     Functional Limitations Info  Sight, Speech, Hearing Sight Info: Impaired Hearing Info: Adequate Speech Info: Adequate    SPECIAL CARE FACTORS FREQUENCY  PT (By licensed PT), OT (By licensed OT)     PT Frequency: 5x/week OT Frequency: 5x/week             Contractures Contractures Info: Not present    Additional Factors Info  Code Status Code Status Info: Fullcode  Allergies Info: NKA           Current Medications (10/11/2018):  This is the current hospital active medication list Current Facility-Administered Medications  Medication Dose Route Frequency Provider Last Rate Last Dose  . 0.9 %  sodium chloride infusion  250 mL Intravenous PRN Bonnielee Haff, MD      . acetaminophen (TYLENOL) tablet 650 mg  650 mg Oral Q6H PRN Bonnielee Haff, MD   650 mg at 10/09/18 2030   Or  . acetaminophen (TYLENOL) suppository 650 mg  650 mg Rectal Q6H PRN Bonnielee Haff, MD      . albuterol (PROVENTIL) (2.5 MG/3ML) 0.083% nebulizer solution 2.5 mg  2.5 mg Nebulization Q2H PRN Bonnielee Haff, MD      . amiodarone (PACERONE) tablet 200 mg  200 mg Oral BID Dorothy Spark, MD       Followed by  . [START ON 10/14/2018] amiodarone (PACERONE) tablet 200 mg  200 mg Oral Daily [name]  H, MD      . apixaban (ELIQUIS) tablet 5 mg  5 mg Oral BID Rai, Ripudeep K, MD   5 mg at 10/11/18 1037  . [START ON 10/12/2018] diltiazem (CARDIZEM CD) 24 hr capsule 240 mg  240 mg Oral Daily [name] H, MD      . docusate sodium (COLACE) capsule 100 mg  100 mg Oral BID Bonnielee Haff, MD   100 mg at 10/09/18 8768  . feeding supplement (ENSURE ENLIVE) (ENSURE ENLIVE) liquid 237 mL  237 mL Oral BID BM Eugenie Filler, MD   237 mL at 10/10/18 1924  . folic acid (FOLVITE) tablet 1 mg  1 mg Oral Daily Bonnielee Haff, MD   1 mg at 10/11/18 1037  . furosemide (LASIX) tablet 40 mg  40 mg Oral BID Rai, Ripudeep K, MD   40 mg at  10/11/18 1050  . guaiFENesin (MUCINEX) 12 hr tablet 1,200 mg  1,200 mg Oral BID Eugenie Filler, MD   1,200 mg at 10/11/18 1036  . MEDLINE mouth rinse  15 mL Mouth Rinse BID Eugenie Filler, MD   15 mL at 10/09/18 1157  . multivitamin with minerals tablet 1 tablet  1 tablet Oral Daily Bonnielee Haff, MD   1 tablet at 10/11/18 1036  . ondansetron (ZOFRAN) tablet 4 mg  4 mg Oral Q6H PRN Bonnielee Haff, MD       Or  . ondansetron [facility]) injection 4 mg  4 mg Intravenous Q6H PRN Bonnielee Haff, MD      . polyethylene glycol (MIRALAX / GLYCOLAX) packet 17 g  17 g Oral Daily PRN Bonnielee Haff, MD      . potassium chloride SA (K-DUR,KLOR-CON) CR tablet 40 mEq  40 mEq Oral Daily Rai, Ripudeep K, MD   40 mEq at 10/11/18 1036  . sodium chloride 0.9 % 60 mL with alteplase (CATHFLO ACTIVASE) 8 mg infusion   Intravenous Once Aletta Edouard, MD      . sodium chloride flush (NS) 0.9 % injection 3 mL  3 mL Intravenous Q12H Bonnielee Haff, MD   3 mL at 10/11/18 1038  . sodium chloride flush (NS) 0.9 % injection 3 mL  3 mL Intravenous PRN Bonnielee Haff, MD      . traMADol Veatrice Bourbon) tablet 50 mg  50 mg Oral Q6H PRN Bonnielee Haff, MD   50 mg at 10/09/18 2030     Discharge Medications: Please see discharge summary for a list of discharge medications.  Relevant Imaging Results:  Relevant Lab Results:   Additional Information SS# [id number] pt has a Right Pleurx catheter/ will be getting chemo  Lia Hopping, LCSW

## 2018-10-11 NOTE — Progress Notes (Addendum)
Progress Note  Patient Name: Brian Ramirez Date of Encounter: 10/11/2018  Primary Cardiologist: [name], MD   Subjective   Pt is largely unaware of his rhythm changes with only occasional palpitations, no lightheadedness, dizziness, or pre-syncope. Pt wishes to discharge home.  Inpatient Medications    Scheduled Meds: . amiodarone  400 mg Oral BID  . apixaban  5 mg Oral BID  . diltiazem  180 mg Oral Daily  . docusate sodium  100 mg Oral BID  . feeding supplement (ENSURE ENLIVE)  237 mL Oral BID BM  . folic acid  1 mg Oral Daily  . furosemide  40 mg Oral BID  . guaiFENesin  1,200 mg Oral BID  . mouth rinse  15 mL Mouth Rinse BID  . multivitamin with minerals  1 tablet Oral Daily  . potassium chloride  40 mEq Oral Daily  . sodium chloride flush  3 mL Intravenous Q12H   Continuous Infusions: . sodium chloride    . sodium chloride 0.9 % 60 mL with alteplase (CATHFLO ACTIVASE) 8 mg infusion     PRN Meds: sodium chloride, acetaminophen **OR** acetaminophen, albuterol, ondansetron **OR** ondansetron (ZOFRAN) IV, polyethylene glycol, sodium chloride flush, traMADol   Vital Signs    Vitals:   10/11/18 0500 10/11/18 0600 10/11/18 0700 10/11/18 0800  BP: (!) 126/56 134/73 129/63   Pulse: 92 91 85   Resp: 17 16 17    Temp:    98.2 F (36.8 C)  TempSrc:    Oral  SpO2: 98% 98% 99%   Weight:      Height:        Intake/Output Summary (Last 24 hours) at 10/11/2018 1134 Last data filed at 10/11/2018 0645 Gross per 24 hour  Intake 848.39 ml  Output 3875 ml  Net -3026.61 ml   Last 3 Weights 10/11/2018 10/10/2018 10/09/2018  Weight (lbs) 167 lb 5.3 oz 171 lb 11.8 oz 167 lb 12.3 oz  Weight (kg) 75.9 kg 77.9 kg 76.1 kg      Telemetry    Bouts of Aflutter with periods of sinus 90-100s - Personally Reviewed  ECG    No new tracings - Personally Reviewed  Physical Exam   GEN: No acute distress.   Neck: No JVD Cardiac: RRR, no murmurs, rubs, or gallops.    Respiratory: coarse sounds, right pleurx. GI: Soft, nontender, non-distended  MS: No edema; No deformity. Neuro:  Nonfocal  Psych: Normal affect   Labs    Chemistry Recent Labs  Lab 10/09/18 0241 10/10/18 0806 10/10/18 1301 10/11/18 0544  NA 133* 136  --  134*  K 3.8 2.8* 3.1* 3.3*  CL 98 90*  --  89*  CO2 27 36*  --  37*  GLUCOSE 81 139*  --  117*  BUN 10 9  --  10  CREATININE 0.46* 0.56*  --  0.58*  CALCIUM 7.8* 8.1*  --  8.1*  PROT 5.5*  --   --   --   ALBUMIN 1.5*  --   --   --   AST 24  --   --   --   ALT 21  --   --   --   ALKPHOS 88  --   --   --   BILITOT 1.0  --   --   --   GFRNONAA >60 >60  --  >60  GFRAA >60 >60  --  >60  ANIONGAP 8 10  --  8  Hematology Recent Labs  Lab 10/09/18 0241 10/09/18 1254 10/10/18 0806 10/11/18 0544  WBC 7.8  --  8.0 7.6  RBC 2.73*  --  3.62* 3.55*  HGB 7.2* 7.7* 10.0* 9.7*  HCT 24.9* 24.7* 32.2* 32.0*  MCV 91.2  --  89.0 90.1  MCH 26.4  --  27.6 27.3  MCHC 28.9*  --  31.1 30.3  RDW 16.9*  --  15.9* 16.2*  PLT 389  --  378 386    Cardiac EnzymesNo results for input(s): TROPONINI in the last 168 hours. No results for input(s): TROPIPOC in the last 168 hours.   BNP Recent Labs  Lab 10/09/18 0241 10/10/18 0806  BNP 244.0* 360.9*     DDimer No results for input(s): DDIMER in the last 168 hours.   Radiology    Ir Instill Via Ch/tube Fibrinolysis Ini  Result Date: 10/10/2018 INDICATION: Patient with history of metastatic non-small cell lung cancer with recurrent malignant right pleural effusion, status post right PleurX catheter placement on 09/11/2018. Patient has had minimal output from drain recently despite significant loculated effusion on imaging and request now received for tPA dwell into chest drain to expedite output. EXAM: INSTALLATION OF TPA INTO RIGHT CHEST DRAIN/PLEURX MEDICATIONS: 8 mg of tPA in 60 cc normal saline ANESTHESIA/SEDATION: None COMPLICATIONS: None immediate. PROCEDURE: Informed consent  was obtained from the patient after a thorough discussion of the procedural risks, benefits and alternatives. All questions were addressed. Maximal Sterile Barrier Technique was utilized including caps, mask, sterile gowns, sterile gloves, sterile drape, hand hygiene and skin antiseptic. A timeout was performed prior to the initiation of the procedure. Approximately 8 mg of tPA in 60 cc normal saline was instilled into right PleurX catheter and clamped for 2 hours. Approximately 1 L of bloody fluid was subsequently drained from right pleural space via PleurX canister . IMPRESSION: Successful installation of tPA into right PleurX catheter with removal of 1 liter of bloody fluid following dwell time of 2 hours. No immediate complications. Read by: Rowe Robert, PA-C Electronically Signed   By: Aletta Edouard M.D.   On: 10/10/2018 17:07    Cardiac Studies   Echo 10/09/2018 1. The left ventricle has a visually estimated ejection fraction of of 55%. The cavity size was normal. Left ventricular diastolic parameters were normal No evidence of left ventricular regional wall motion abnormalities. 2. The right ventricle has normal systolic function. The cavity was normal. There is no increase in right ventricular wall thickness. 3. Left atrial size was moderately dilated. 4. The tricuspid valve is normal in structure. 5. The aortic valve is tricuspid Mild calcification of the aortic valve. no stenosis of the aortic valve. 6. The pulmonic valve was normal in structure. 7. The aortic root and ascending aorta are normal in size and structure. 8. The mitral valve is normal in structure. No evidence of mitral valve stenosis. Trivial mitral regurgitation. 9. Normal IVC size. PA systolic pressure 37 mmHg.  Patient Profile     73 y.o. male with non small cell lung cancer, R pleural effusion s/p pleurx catheter, anemia found to have new atrial flutter with RVR.   Assessment & Plan    1. Atrial flutter -  telemetry with bouts of Afib/flutter and sinus rhythm - he is currently on cardizem 180 mg daily and 400 mg amiodarone - will discuss with attending D/C amiodarone given lung cancer - he is currently in sinus in the 90-100s - he is asymptomatic with the transitions in and out  of flutter, only occassionally feeling palpitations  - consider adding 50 mg toprol for better rate control given marginal pressures - anticoagulation with eliquis   2. Metastatic lung cancer with pleurx in place - 1L output form pleurx after TPA - pleurx is draining, no plans for procedure today  3. Anemia - daily CBC in the setting of eliquis - Hb today stable at 9.7 (10.0)  4. Hypokalemia - K 3.3 - redose PO potassium  Will arrange follow up with cardiology. Will need CBC in 1 week.    For questions or updates, please contact [facility] Please consult [email or web address] for contact info under     Signed, [name], MD  10/11/2018, 11:34 AM    The patient was seen, examined and discussed with Minette Brine , PA-C and I agree with the above.   73 y.o.malewith a hx of malignant neoplasm of Rt lung-metastatic non small cell lung cancer on Keytruda and anemia with hgb yesterday of 7.2. He was being transfused when he was found to be in a fib with RVR at 175. He was sent to ER from Chemo area. Also hx of large Rt malignant pl effusion with bleeding s/p pleurx catheter. Initially EKG revealed A fib but with dilt drip no response and adenosine was given with some slowing flutter waves could be seen. Pt without chest pain or SOB, pt was aware ofrapid HRstarted in middle of blood transfusion.Echo showed normal LVEF, moderately dilated left atrium.  He remains in SR with short episodes of a-fib with RVR, however predominantly in SR. Long term we will not want to use amiodarone, I will decrease to amiodarone 200 mg po BID for the next 3 days, followed by amiodarone 200 mg po daily.  He is scheduled to  see [name] in the office on 3/11, if he is SR at that time I will discontinue amiodarone. I will increase cardizem CD to 240 mg po daily.  Regarding anticoagulation. CHA2DS2Vasc of 1 but because with cancer this may increase risk of stroke, it was decided that he will be on Eliquis. He had bloody pleural effusion. Hb is stable at 9.7, if it continues to drop I would recommend to continue aspirin 81 mg po daily only.  His LE edema has improved dramatically, I agree with switching lasix to 40 mg PO BID, if he has no recurrent edema at the next office visit, we can change to 40 mg po daily. He is encouraged to continue using compression socks.  He can be discharged today.   [name], MD 10/11/2018

## 2018-10-11 NOTE — Progress Notes (Signed)
See CSW full clinical assessment 2/7   CSW following for discharge needs back to [facility] to continue ongoing Physical Therapy.   Patient currently has wheel chair and O2 tank in his room, property of [facility]. The facility has arranged to pick up this equipment.  FL2 updated.    Patient will transport by [facility].   Nurse call report to: Walton, Marlinda Mike, MSW Clinical Social Worker  [phone] 10/11/2018  12:01 PM
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
## 2018-10-11 NOTE — Discharge Summary (Signed)
Physician Discharge Summary   Patient ID: Brian Ramirez MRN: [id number] DOB/AGE: [birth year] 73 y.o.  Admit date: 10/08/2018 Discharge date: 10/11/2018  Primary Care Physician:  [name], MD   Recommendations for Outpatient Follow-up:  1. Follow up with PCP in 1-2 weeks 2. Continue Lasix 40 mg twice a day, follow be met on Monday, 10/15/2018 3. Continue amiodarone 200 mg twice a day for next 3 days then continue 200 mg daily until follow-up with cardiology 4. Follow CBC on Monday, 10/15/2018.  Patient is restarted on Eliquis, however if hemoglobin is dropping, please notify cardiology, changed to aspirin 81 mg daily only. 5. Patient has Pleurx catheter, continue as needed drainage.  Avoid draining more than 1 L over 24 hours, can do daily Pleurx catheter drainage until less than 100 cc 6. Follow outpatient with Dr. Learta Codding for chemotherapy  Home Health: Patient being discharged to skilled nursing facility to continue rehab Equipment/Devices:   Discharge Condition: stable CODE STATUS: FULL Diet recommendation: Heart healthy diet   Discharge Diagnoses:    . Atrial flutter with rapid ventricular response ([place]) . Metastatic non-small cell lung cancer ([place]) . Pleural effusion . Acute blood loss anemia with history of anemia of chronic disease .  acute diastolic CHF likely precipitated due to RVR . Malnutrition of moderate degree . Malignant right-sided pleural effusion with Pleurx catheter   Consults:  Cardiology Interventional radiology   Allergies:  No Known Allergies   DISCHARGE MEDICATIONS: Allergies as of 10/11/2018   No Known Allergies     Medication List    STOP taking these medications   magic mouthwash Soln     TAKE these medications   amiodarone 200 MG tablet Commonly known as:  PACERONE Take amiodarone 200 mg twice a day for next 3 days, then 200 mg daily thereafter   apixaban 5 MG Tabs tablet Commonly known as:  ELIQUIS Take 1 tablet (5 mg  total) by mouth 2 (two) times daily.   diltiazem 240 MG 24 hr capsule Commonly known as:  CARDIZEM CD Take 1 capsule (240 mg total) by mouth daily. Start taking on:  October 12, 2018   docusate sodium 100 MG capsule Commonly known as:  COLACE Take 100 mg by mouth 2 (two) times daily.   folic acid 1 MG tablet Commonly known as:  FOLVITE Take 1 tablet (1 mg total) by mouth daily for 30 days.   furosemide 40 MG tablet Commonly known as:  LASIX Take 1 tablet (40 mg total) by mouth 2 (two) times daily.   guaiFENesin 600 MG 12 hr tablet Commonly known as:  MUCINEX Take 1 tablet (600 mg total) by mouth 2 (two) times daily. What changed:    medication strength  how much to take   multivitamin with minerals Tabs tablet Take 1 tablet by mouth daily. CertaVite   NUTRITIONAL DRINK PO Take 1 application by mouth 3 (three) times daily.   polyethylene glycol packet Commonly known as:  MIRALAX / GLYCOLAX Take 17 g by mouth daily as needed for moderate constipation or severe constipation (use first). What changed:  additional instructions   potassium chloride SA 20 MEQ tablet Commonly known as:  K-DUR,KLOR-CON Take 1 tablet (20 mEq total) by mouth daily.   traMADol 50 MG tablet Commonly known as:  ULTRAM Take 1 tablet (50 mg total) by mouth every 6 (six) hours as needed for moderate pain or severe pain. What changed:    how much to take  reasons to take this  Brief H and P: For complete details please refer to admission H and P, but in brief Patient is a pleasant 73 year old gentleman history of recently diagnosed metastatic non-small cell lung cancer 1 cycle of systemic chemotherapy, history of large right malignant pleural effusion with bleeding status post right Pleurx catheter placement which per patient usually drains bloody fluid, anemia of chronic disease, moderate protein calorie malnutrition. Patient states right Pleurx catheter has not been drained in 2 days  at the skilled nursing facility where he was at as they had tried to drain it with no output and he felt they attempted it wrongly. Patient had presented to his oncologist office for blood work noted to have a hemoglobin of 7.2 was getting a unit of packed red blood cell transfusion when noted to have heart rates in the 150s and noted need to be in A. fib. Patient had no response to Cardizem infusion as well as adenosine. Patient sent to the ED. Patient noted to have flutter waves. TSH was done within normal limits. Patient was started on Eliquis in discussion with oncology and due to increased risk of stroke.   Hospital Course:  Atrial flutter with rapid ventricular response ([facility]) -In the setting of malignant neoplasm right lung, metastatic, anemia secondary to chemotherapy -Patient now remains in sinus rhythm which showed episodes of A. fib with RVR, patient was placed on amiodarone by cardiology, recommended 200 mg p.o. twice daily for next 3 days and then decrease to amiodarone 200 mg daily.  He has a follow-up scheduled with cardiology on 3/11, if in normal sinus rhythm cardiology considering to discontinue amiodarone given lung CA.  Cardizem was increased to 240 mg daily. -2D echo showed EF of 55%, no regional wall motion abnormalities - CHADvasc 1 however given metastatic lung CA, increased risk of stroke.  Oncology had recommended anticoagulation, patient was placed on Eliquis 5 mg twice a day which was briefly held for Pleurx catheter exchange. -Patient had bloody aspirate from Pleurx catheter drainage, currently H&H is stable.  However if it continues to drop, cardiology recommended to continue aspirin 81 mg p.o. daily only.   Metastatic non-small cell lung CA -Status post 1 cycle of chemo on 09/28/2018, follow outpatient with Dr. Learta Codding  Acute blood loss anemia with history of anemia of chronic disease -Patient noted to have bleeding into the right pleural space, hemoglobin 8.1 on  admission -Transfused on 2/25, hemoglobin stable 9.7 today -Please check CBC on Monday  Bilateral lower extremity edema/acute diastolic CHF exacerbation likely precipitated due to A. fib with RVR -Bilateral lower extremity edema improved, now euvolemic, patient was placed on IV Lasix -Continue oral Lasix 40 mg twice daily, patient has follow-up appointment with cardiology planned and dose will be adjusted outpatient. -Continue potassium replacement.  Check bmet on Monday   Malignant right-sided pleural effusion with Pleurx catheter -IR was consulted initially plan was for Pleurx catheter exchange however was unclogged with TPA solution. -Proximately 1 L of bloody fluid was aspirated from patient's right Pleurx after the TPA. -I recommended continue as needed drainage, should problem arise again consider repeat TPA use.  Confirm residual pleural effusion with chest x-ray or ultrasound before using any future TPA. -If patient continues to have bloody drainage and hemoglobin is dropping, consider changing to aspirin 81 mg daily only.   Severe protein calorie malnutrition -Patient received IV albumin  Day of Discharge S: Currently no complaints, normal sinus rhythm, no acute issues overnight.  BP 118/69   Pulse 94  Temp 98.2 F (36.8 C) (Oral)   Resp (!) 27   Ht '6\' 1"'  (1.854 m)   Wt 75.9 kg   SpO2 97%   BMI 22.08 kg/m   Physical Exam: General: Alert and awake oriented x3 not in any acute distress. HEENT: anicteric sclera, pupils reactive to light and accommodation CVS: S1-S2 clear no murmur rubs or gallops Chest: Decreased breath sound at the bases Abdomen: soft nontender, nondistended, normal bowel sounds Extremities: no cyanosis, clubbing or edema noted bilaterally Neuro: Cranial nerves II-XII intact, no focal neurological deficits   The results of significant diagnostics from this hospitalization (including imaging, microbiology, ancillary and laboratory) are listed  below for reference.      Procedures/Studies:  Dg Chest 2 View  Result Date: 10/09/2018 CLINICAL DATA:  Cancer patient, f/u RIGHT sided pleural effusion. EXAM: CHEST - 2 VIEW COMPARISON:  Chest x-rays dated 10/08/2018 and 09/19/2018. Chest CT dated 09/04/2018. FINDINGS: Large RIGHT pleural effusion, stable. Persistent masslike opacity at the RIGHT lung apex, corresponding to the anterior RIGHT upper lobe mass described on earlier chest CT. Persistent perihilar edema, RIGHT greater than LEFT. LEFT lower lobe pulmonary nodules better demonstrated on earlier chest CT. Heart size and mediastinal contours appear stable. Chest tube at the RIGHT lung base. IMPRESSION: 1. Stable large RIGHT pleural effusion and RIGHT apical mass. Persistent perihilar edema, RIGHT greater than LEFT. 2. Chest tube at the RIGHT lung base. Electronically Signed   By: Franki Cabot M.D.   On: 10/09/2018 12:37   Dg Chest 2 View  Result Date: 09/19/2018 CLINICAL DATA:  Dyspnea. History of lung cancer with recurrent RIGHT pleural effusion, PleurX catheter placed 1 week ago. EXAM: CHEST - 2 VIEW COMPARISON:  Chest radiograph September 11, 2018 FINDINGS: Slightly decreased residual moderate to large RIGHT pleural effusion with pleural vac catheter projecting in RIGHT lung base. Interstitial prominence with scattered nodular densities. RIGHT upper lobe density at site of known mass. Cardiac silhouette predominantly obscured. Calcified aortic arch. No pneumothorax. IMPRESSION: 1. Moderate to large residual RIGHT pleural effusion with pleural vac catheter in place. 2.  Aortic Atherosclerosis (ICD10-I70.0). Electronically Signed   By: Elon Alas M.D.   On: 09/19/2018 16:56   Dg Chest Bilateral Decubitus  Result Date: 10/09/2018 CLINICAL DATA:  Cancer patient, f/u RIGHT sided pleural effusion. EXAM: CHEST - BILATERAL DECUBITUS VIEW COMPARISON:  Chest x-ray dated 10/08/2018. FINDINGS: Large RIGHT-sided pleural effusion, without  significant change on the LEFT lateral decubitus view indicating loculation. No LEFT-sided effusion identified. Chest tube in place at the RIGHT lung base IMPRESSION: Large RIGHT pleural effusion appears to be loculated, given the lack of change in configuration on the bilateral decubitus views. Chest tube in place at the RIGHT lung base. Electronically Signed   By: Franki Cabot M.D.   On: 10/09/2018 12:41   Dg Chest Port 1 View  Result Date: 10/08/2018 CLINICAL DATA:  Shortness of breath EXAM: PORTABLE CHEST 1 VIEW COMPARISON:  09/19/2010 FINDINGS: There is a large right pleural effusion. There is bilateral interstitial thickening. There is no left pleural effusion. There is no pneumothorax. There is a right apical pulmonary mass. The heart size is stable. There is no acute osseous abnormality. IMPRESSION: 1. Large right pleural effusion. Persistent right apical pulmonary mass. 2. Bilateral diffuse interstitial thickening which may reflect mild interstitial edema. Electronically Signed   By: Kathreen Devoid   On: 10/08/2018 12:41   Ir Instill Via Ch/tube Fibrinolysis Ini  Result Date: 10/10/2018 INDICATION: Patient with history of  metastatic non-small cell lung cancer with recurrent malignant right pleural effusion, status post right PleurX catheter placement on 09/11/2018. Patient has had minimal output from drain recently despite significant loculated effusion on imaging and request now received for tPA dwell into chest drain to expedite output. EXAM: INSTALLATION OF TPA INTO RIGHT CHEST DRAIN/PLEURX MEDICATIONS: 8 mg of tPA in 60 cc normal saline ANESTHESIA/SEDATION: None COMPLICATIONS: None immediate. PROCEDURE: Informed consent was obtained from the patient after a thorough discussion of the procedural risks, benefits and alternatives. All questions were addressed. Maximal Sterile Barrier Technique was utilized including caps, mask, sterile gowns, sterile gloves, sterile drape, hand hygiene and skin  antiseptic. A timeout was performed prior to the initiation of the procedure. Approximately 8 mg of tPA in 60 cc normal saline was instilled into right PleurX catheter and clamped for 2 hours. Approximately 1 L of bloody fluid was subsequently drained from right pleural space via PleurX canister . IMPRESSION: Successful installation of tPA into right PleurX catheter with removal of 1 liter of bloody fluid following dwell time of 2 hours. No immediate complications. Read by: Rowe Robert, PA-C Electronically Signed   By: Aletta Edouard M.D.   On: 10/10/2018 17:07   Vas [place] Lower Extremity Venous (dvt)  Result Date: 09/19/2018  Lower Venous Study Indications: Edema.  Performing Technologist: June Leap RDMS, RVT  Examination Guidelines: A complete evaluation includes B-mode imaging, spectral Doppler, color Doppler, and power Doppler as needed of all accessible portions of each vessel. Bilateral testing is considered an integral part of a complete examination. Limited examinations for reoccurring indications may be performed as noted.  Right Venous Findings: +---------+---------------+---------+-----------+----------+-------+          CompressibilityPhasicitySpontaneityPropertiesSummary +---------+---------------+---------+-----------+----------+-------+ CFV      Full           Yes      Yes                          +---------+---------------+---------+-----------+----------+-------+ SFJ      Full                                                 +---------+---------------+---------+-----------+----------+-------+ FV Prox  Full                                                 +---------+---------------+---------+-----------+----------+-------+ FV Mid   Full                                                 +---------+---------------+---------+-----------+----------+-------+ FV DistalFull                                                  +---------+---------------+---------+-----------+----------+-------+ PFV      Full                                                 +---------+---------------+---------+-----------+----------+-------+  POP      Full           Yes      Yes                          +---------+---------------+---------+-----------+----------+-------+ PTV      Full                                                 +---------+---------------+---------+-----------+----------+-------+ PERO     Full                                                 +---------+---------------+---------+-----------+----------+-------+  Left Venous Findings: +---------+---------------+---------+-----------+----------+-------+          CompressibilityPhasicitySpontaneityPropertiesSummary +---------+---------------+---------+-----------+----------+-------+ CFV      Full           Yes      Yes                          +---------+---------------+---------+-----------+----------+-------+ SFJ      Full                                                 +---------+---------------+---------+-----------+----------+-------+ FV Prox  Full                                                 +---------+---------------+---------+-----------+----------+-------+ FV Mid   Full                                                 +---------+---------------+---------+-----------+----------+-------+ FV DistalFull                                                 +---------+---------------+---------+-----------+----------+-------+ PFV      Full                                                 +---------+---------------+---------+-----------+----------+-------+ POP      Full           Yes      Yes                          +---------+---------------+---------+-----------+----------+-------+ PTV      Full                                                  +---------+---------------+---------+-----------+----------+-------+  PERO     Full                                                 +---------+---------------+---------+-----------+----------+-------+    Summary: Right: There is no evidence of deep vein thrombosis in the lower extremity. No cystic structure found in the popliteal fossa. Left: There is no evidence of deep vein thrombosis in the lower extremity. No cystic structure found in the popliteal fossa.  *See table(s) above for measurements and observations. Electronically signed by Curt Jews MD on 09/19/2018 at 9:42:06 PM.    Final    Ir Perc Pleural Drain Genia Harold Cath W/img Guide  Result Date: 09/11/2018 INDICATION: Malignant right pleural effusion EXAM: ULTRASOUND AND FLUOROSCOPIC RIGHT CHEST TUNNELED PLEURAL DRAIN (PLEURX CATHETER) MEDICATIONS: The patient is currently admitted to the hospital and receiving intravenous antibiotics. The antibiotics were administered within an appropriate time frame prior to the initiation of the procedure. ANESTHESIA/SEDATION: Fentanyl 100 mcg IV; Versed 2.0 mg IV Moderate Sedation Time:  11 MINUTES The patient was continuously monitored during the procedure by the interventional radiology nurse under my direct supervision. COMPLICATIONS: None immediate. PROCEDURE: Informed written consent was obtained from the patient after a thorough discussion of the procedural risks, benefits and alternatives. All questions were addressed. Maximal Sterile Barrier Technique was utilized including caps, mask, sterile gowns, sterile gloves, sterile drape, hand hygiene and skin antiseptic. A timeout was performed prior to the initiation of the procedure. previous imaging reviewed. preliminary ultrasound performed. the complex loculated right pleural effusion was marked in the mid axillary line through a lower intercostal space. under sterile conditions and local anesthesia, ultrasound percutaneous access performed of the right  chest. needle position confirmed with ultrasound. images obtained for documentation. amplatz guidewire inserted. the pleurx catheter was tunneled subcutaneously to the puncture site and advanced into the chest through a peel-away sheath. position confirmed with ultrasound and fluoroscopy. 1.2 l thoracentesis performed following insertion. catheter secured with prolene suture. entry site closed with subcuticular vicryl suture and dermabond. no immediate complication. patient tolerated the procedure well. IMPRESSION: Successful ultrasound fluoroscopic right chest tunneled pleural drain (PleurX catheter). 1.2 L thoracentesis performed after insertion. Electronically Signed   By: Jerilynn Mages.  Shick M.D.   On: 09/11/2018 17:30       LAB RESULTS: Basic Metabolic Panel: Recent Labs  Lab 10/10/18 0806 10/10/18 1301 10/11/18 0544  NA 136  --  134*  K 2.8* 3.1* 3.3*  CL 90*  --  89*  CO2 36*  --  37*  GLUCOSE 139*  --  117*  BUN 9  --  10  CREATININE 0.56*  --  0.58*  CALCIUM 8.1*  --  8.1*  MG 1.9  --  1.7   Liver Function Tests: Recent Labs  Lab 10/09/18 0241  AST 24  ALT 21  ALKPHOS 88  BILITOT 1.0  PROT 5.5*  ALBUMIN 1.5*   No results for input(s): LIPASE, AMYLASE in the last 168 hours. No results for input(s): AMMONIA in the last 168 hours. CBC: Recent Labs  Lab 10/10/18 0806 10/11/18 0544  WBC 8.0 7.6  NEUTROABS 6.0  --   HGB 10.0* 9.7*  HCT 32.2* 32.0*  MCV 89.0 90.1  PLT 378 386   Cardiac Enzymes: No results for input(s): CKTOTAL, CKMB, CKMBINDEX, TROPONINI in the last 168 hours. BNP: Invalid input(s): POCBNP CBG:  No results for input(s): GLUCAP in the last 168 hours.    Disposition and Follow-up: Discharge Instructions    (HEART FAILURE PATIENTS) Call MD:  Anytime you have any of the following symptoms: 1) 3 pound weight gain in 24 hours or 5 pounds in 1 week 2) shortness of breath, with or without a dry hacking cough 3) swelling in the hands, feet or stomach 4) if  you have to sleep on extra pillows at night in order to breathe.   Complete by:  As directed    (HEART FAILURE PATIENTS) Call MD:  Anytime you have any of the following symptoms: 1) 3 pound weight gain in 24 hours or 5 pounds in 1 week 2) shortness of breath, with or without a dry hacking cough 3) swelling in the hands, feet or stomach 4) if you have to sleep on extra pillows at night in order to breathe.   Complete by:  As directed    Amb referral to [facility]   Complete by:  As directed    Diet - low sodium heart healthy   Complete by:  As directed    Increase activity slowly   Complete by:  As directed        DISPOSITION: [place] information for follow-up providers    Koirala, Dibas, MD. Schedule an appointment as soon as possible for a visit in 2 week(s).   Specialty:  Family Medicine Contact information: [address] [place] [address] [phone]        Dorothy Spark, MD. Schedule an appointment as soon as possible for a visit in 2 week(s).   Specialty:  Cardiology Contact information: [address] [place] [id number] [phone]        Imogene Burn, PA-C Follow up on 10/24/2018.   Specialty:  Cardiology Why:  11:00 am for hospital follow up Contact information: [address] [place] [address] [phone]            Contact information for after-discharge care    Destination    [facility] .   Service:  Skilled Nursing Contact information: [address] [place] [phone]                   Time coordinating discharge:  45 minutes  Signed:   Estill Cotta M.D. [facility] 10/11/2018, 12:08 PM

## 2018-10-12 DIAGNOSIS — I4892 Unspecified atrial flutter: Secondary | ICD-10-CM | POA: Diagnosis not present

## 2018-10-12 DIAGNOSIS — C3490 Malignant neoplasm of unspecified part of unspecified bronchus or lung: Secondary | ICD-10-CM | POA: Diagnosis not present

## 2018-10-12 DIAGNOSIS — J90 Pleural effusion, not elsewhere classified: Secondary | ICD-10-CM | POA: Diagnosis not present

## 2018-10-12 DIAGNOSIS — D638 Anemia in other chronic diseases classified elsewhere: Secondary | ICD-10-CM | POA: Diagnosis not present

## 2018-10-12 DIAGNOSIS — E440 Moderate protein-calorie malnutrition: Secondary | ICD-10-CM | POA: Diagnosis not present

## 2018-10-12 DIAGNOSIS — I5030 Unspecified diastolic (congestive) heart failure: Secondary | ICD-10-CM | POA: Diagnosis not present

## 2018-10-15 ENCOUNTER — Inpatient Hospital Stay: Payer: Medicare Other

## 2018-10-15 ENCOUNTER — Inpatient Hospital Stay: Payer: Medicare Other | Attending: Oncology

## 2018-10-15 ENCOUNTER — Other Ambulatory Visit: Payer: Self-pay

## 2018-10-15 ENCOUNTER — Telehealth: Payer: Self-pay | Admitting: Oncology

## 2018-10-15 ENCOUNTER — Inpatient Hospital Stay (HOSPITAL_BASED_OUTPATIENT_CLINIC_OR_DEPARTMENT_OTHER): Payer: Medicare Other | Admitting: Oncology

## 2018-10-15 VITALS — BP 129/66 | HR 99 | Temp 98.9°F | Resp 18 | Ht 73.0 in | Wt 157.7 lb

## 2018-10-15 DIAGNOSIS — Z7901 Long term (current) use of anticoagulants: Secondary | ICD-10-CM | POA: Insufficient documentation

## 2018-10-15 DIAGNOSIS — R5383 Other fatigue: Secondary | ICD-10-CM | POA: Diagnosis not present

## 2018-10-15 DIAGNOSIS — C3490 Malignant neoplasm of unspecified part of unspecified bronchus or lung: Secondary | ICD-10-CM

## 2018-10-15 DIAGNOSIS — Z5112 Encounter for antineoplastic immunotherapy: Secondary | ICD-10-CM | POA: Diagnosis not present

## 2018-10-15 DIAGNOSIS — Z5111 Encounter for antineoplastic chemotherapy: Secondary | ICD-10-CM | POA: Diagnosis not present

## 2018-10-15 DIAGNOSIS — C3491 Malignant neoplasm of unspecified part of right bronchus or lung: Secondary | ICD-10-CM | POA: Insufficient documentation

## 2018-10-15 DIAGNOSIS — J90 Pleural effusion, not elsewhere classified: Secondary | ICD-10-CM | POA: Insufficient documentation

## 2018-10-15 DIAGNOSIS — I4892 Unspecified atrial flutter: Secondary | ICD-10-CM | POA: Insufficient documentation

## 2018-10-15 DIAGNOSIS — Z79899 Other long term (current) drug therapy: Secondary | ICD-10-CM | POA: Insufficient documentation

## 2018-10-15 DIAGNOSIS — Z87891 Personal history of nicotine dependence: Secondary | ICD-10-CM | POA: Insufficient documentation

## 2018-10-15 DIAGNOSIS — J910 Malignant pleural effusion: Secondary | ICD-10-CM

## 2018-10-15 DIAGNOSIS — D649 Anemia, unspecified: Secondary | ICD-10-CM | POA: Insufficient documentation

## 2018-10-15 LAB — CBC WITH DIFFERENTIAL (CANCER CENTER ONLY)
Abs Immature Granulocytes: 0.11 10*3/uL — ABNORMAL HIGH (ref 0.00–0.07)
Basophils Absolute: 0 10*3/uL (ref 0.0–0.1)
Basophils Relative: 0 %
Eosinophils Absolute: 0.2 10*3/uL (ref 0.0–0.5)
Eosinophils Relative: 2 %
HCT: 31.6 % — ABNORMAL LOW (ref 39.0–52.0)
Hemoglobin: 9.4 g/dL — ABNORMAL LOW (ref 13.0–17.0)
Immature Granulocytes: 1 %
Lymphocytes Relative: 6 %
Lymphs Abs: 0.5 10*3/uL — ABNORMAL LOW (ref 0.7–4.0)
MCH: 26.5 pg (ref 26.0–34.0)
MCHC: 29.7 g/dL — AB (ref 30.0–36.0)
MCV: 89 fL (ref 80.0–100.0)
MONO ABS: 1.2 10*3/uL — AB (ref 0.1–1.0)
Monocytes Relative: 14 %
Neutro Abs: 6.3 10*3/uL (ref 1.7–7.7)
Neutrophils Relative %: 77 %
Platelet Count: 427 10*3/uL — ABNORMAL HIGH (ref 150–400)
RBC: 3.55 MIL/uL — ABNORMAL LOW (ref 4.22–5.81)
RDW: 16.5 % — ABNORMAL HIGH (ref 11.5–15.5)
WBC Count: 8.3 10*3/uL (ref 4.0–10.5)
nRBC: 0 % (ref 0.0–0.2)

## 2018-10-15 LAB — CMP (CANCER CENTER ONLY)
ALK PHOS: 150 U/L — AB (ref 38–126)
ALT: 34 U/L (ref 0–44)
AST: 40 U/L (ref 15–41)
Albumin: 1.7 g/dL — ABNORMAL LOW (ref 3.5–5.0)
Anion gap: 9 (ref 5–15)
BILIRUBIN TOTAL: 0.6 mg/dL (ref 0.3–1.2)
BUN: 9 mg/dL (ref 8–23)
CALCIUM: 8.5 mg/dL — AB (ref 8.9–10.3)
CO2: 32 mmol/L (ref 22–32)
Chloride: 95 mmol/L — ABNORMAL LOW (ref 98–111)
Creatinine: 0.69 mg/dL (ref 0.61–1.24)
GFR, Est AFR Am: >60 mL/min (ref >60)
GFR, Estimated: >60 mL/min (ref >60)
Glucose, Bld: 143 mg/dL — ABNORMAL HIGH (ref 70–99)
Potassium: 3.9 mmol/L (ref 3.5–5.1)
Sodium: 136 mmol/L (ref 135–145)
Total Protein: 7 g/dL (ref 6.5–8.1)

## 2018-10-15 LAB — SAMPLE TO BLOOD BANK

## 2018-10-15 MED ORDER — SODIUM CHLORIDE 0.9 % IV SOLN
380.0000 mg | Freq: Once | INTRAVENOUS | Status: AC
  Administered 2018-10-15: 380 mg via INTRAVENOUS
  Filled 2018-10-15: qty 38

## 2018-10-15 MED ORDER — SODIUM CHLORIDE 0.9 % IV SOLN
Freq: Once | INTRAVENOUS | Status: AC
  Administered 2018-10-15: 12:00:00 via INTRAVENOUS
  Filled 2018-10-15: qty 250

## 2018-10-15 MED ORDER — SODIUM CHLORIDE 0.9 % IV SOLN
200.0000 mg | Freq: Once | INTRAVENOUS | Status: AC
  Administered 2018-10-15: 200 mg via INTRAVENOUS
  Filled 2018-10-15: qty 8

## 2018-10-15 MED ORDER — PALONOSETRON HCL INJECTION 0.25 MG/5ML
0.2500 mg | Freq: Once | INTRAVENOUS | Status: AC
  Administered 2018-10-15: 0.25 mg via INTRAVENOUS

## 2018-10-15 MED ORDER — DEXAMETHASONE SODIUM PHOSPHATE 10 MG/ML IJ SOLN
10.0000 mg | Freq: Once | INTRAMUSCULAR | Status: AC
  Administered 2018-10-15: 10 mg via INTRAVENOUS

## 2018-10-15 MED ORDER — FUROSEMIDE 40 MG PO TABS: 40.0000 mg | ORAL_TABLET | Freq: Every day | ORAL | Status: DC

## 2018-10-15 MED ORDER — PALONOSETRON HCL INJECTION 0.25 MG/5ML
INTRAVENOUS | Status: AC
  Filled 2018-10-15: qty 5

## 2018-10-15 MED ORDER — DEXAMETHASONE SODIUM PHOSPHATE 10 MG/ML IJ SOLN
INTRAMUSCULAR | Status: AC
  Filled 2018-10-15: qty 1

## 2018-10-15 MED ORDER — SODIUM CHLORIDE 0.9 % IV SOLN
1000.0000 mg | Freq: Once | INTRAVENOUS | Status: AC
  Administered 2018-10-15: 1000 mg via INTRAVENOUS
  Filled 2018-10-15: qty 40

## 2018-10-15 NOTE — Addendum Note (Signed)
Addended by: Tania Ade on: 10/15/2018 02:37 PM   Modules accepted: Orders

## 2018-10-15 NOTE — Progress Notes (Addendum)
  [facility] OFFICE PROGRESS NOTE   Diagnosis: Non-small cell lung cancer  INTERVAL HISTORY:   Mr. Tomko was discharged from the hospital 10/11/2018 after an admission with rapid A. fib/flutter.  He reports feeling better.  Good appetite.  Energy level.  He is ambulatory.  He reports a minimal amount of fluid was drained from the Pleurx over the weekend.  The fluid was less bloody.  He is maintained on apixaban anticoagulation.  Objective:  Vital signs in last 24 hours:  Blood pressure 129/66, pulse 99, temperature 98.9 F (37.2 C), temperature source Oral, resp. rate 18, height '6\' 1"'$  (1.854 m), weight 157 lb 11.2 oz (71.5 kg), SpO2 91 %.    HEENT: No thrush or ulcers Resp: Decreased breath sounds throughout the right chest, no respiratory distress Cardio: Regular rate and rhythm GI: No hepatomegaly, nontender Vascular: Trace pitting edema at the low leg bilaterally    Lab Results:  Lab Results  Component Value Date   WBC 8.3 10/15/2018   HGB 9.4 (L) 10/15/2018   HCT 31.6 (L) 10/15/2018   MCV 89.0 10/15/2018   PLT 427 (H) 10/15/2018   NEUTROABS 6.3 10/15/2018    CMP  Lab Results  Component Value Date   NA 136 10/15/2018   K 3.9 10/15/2018   CL 95 (L) 10/15/2018   CO2 32 10/15/2018   GLUCOSE 143 (H) 10/15/2018   BUN 9 10/15/2018   CREATININE 0.69 10/15/2018   CALCIUM 8.5 (L) 10/15/2018   PROT 7.0 10/15/2018   ALBUMIN 1.7 (L) 10/15/2018   AST 40 10/15/2018   ALT 34 10/15/2018   ALKPHOS 150 (H) 10/15/2018   BILITOT 0.6 10/15/2018   GFRNONAA >60 10/15/2018   GFRAA >60 10/15/2018     Medications: I have reviewed the patient's current medications.   Assessment/Plan: 1.Metastatic non-small cell lung cancer  largeright pleural effusion, right lung mass, right pleural-based masses, left lung nodules  Right thoracentesis 09/04/2018-negative cytology  CT biopsy of right lateral pleural mass 09/07/2018-poorly differentiated non-small cell  carcinoma, malignant cells positive for cytokeratin AE1/AE3 and cytokeratin 8/18, histology favoring adenocarcinoma, MSS, tumor mutation burden 5.no EGFR, ALK, BRAF alteration.  PDL 1 rearrangement  PDL 1 tumor proportion score-100%   Cycle 1 Alimta/carboplatin 09/22/2018, pembrolizumab 09/28/2018  Cycle 2 Alimta/carboplatin/pembrolizumab 10/15/2018  2.Severe anemia-likely secondary to bleeding in the right pleural space and metastatic carcinoma, red cell transfusions 09/09/2018 09/10/2018, 09/23/2018 3.Dyspnea secondary to the large right pleural effusion and anemia  Right Pleurx catheter placed 09/11/2018  Chest x-ray 10/09/2018- large right pleural effusion, loculated 4.History of tobacco use 5.History of kidney stones 6. Fever-most likely tumor fever, improved 7.  Admission 10/08/2018 with rapid atrial fibrillation/flutter- improved with diltiazem, started on Eliquis anticoagulation    Disposition: Mr. Geraldo has metastatic non-small cell lung cancer.  His performance status appears improved.  He will complete cycle 2 Alimta/carboplatin/pembrolizumab today. I discussed treatment options with Mr. Salguero and his brother.  The plan is to continue the current regimen for at least 4 cycles and then schedule a restaging CT evaluation.  He will return for a CBC chest x-ray, and office visit 10/25/2018.  I recommend the nursing facility continue to drain the Pleurx catheter every other day. Betsy Coder, MD  10/15/2018  10:40 AM

## 2018-10-15 NOTE — Telephone Encounter (Signed)
Gave avs and calendar ° °

## 2018-10-15 NOTE — Patient Instructions (Signed)
[facility] Discharge Instructions for Patients Receiving Chemotherapy  Today you received the following chemotherapy agents Keytruda, Alimta, Carboplatin  To help prevent nausea and vomiting after your treatment, we encourage you to take your nausea medication as directed.   If you develop nausea and vomiting that is not controlled by your nausea medication, call the clinic.   BELOW ARE SYMPTOMS THAT SHOULD BE REPORTED IMMEDIATELY:  *FEVER GREATER THAN 100.5 F  *CHILLS WITH OR WITHOUT FEVER  NAUSEA AND VOMITING THAT IS NOT CONTROLLED WITH YOUR NAUSEA MEDICATION  *UNUSUAL SHORTNESS OF BREATH  *UNUSUAL BRUISING OR BLEEDING  TENDERNESS IN MOUTH AND THROAT WITH OR WITHOUT PRESENCE OF ULCERS  *URINARY PROBLEMS  *BOWEL PROBLEMS  UNUSUAL RASH Items with * indicate a potential emergency and should be followed up as soon as possible.  Feel free to call the clinic should you have any questions or concerns. The clinic phone number is [phone].  Please show the [facility] at check-in to the Emergency Department and triage nurse.

## 2018-10-16 ENCOUNTER — Telehealth: Payer: Self-pay | Admitting: Oncology

## 2018-10-16 NOTE — Telephone Encounter (Signed)
Error

## 2018-10-17 ENCOUNTER — Other Ambulatory Visit: Payer: Self-pay | Admitting: *Deleted

## 2018-10-17 DIAGNOSIS — C3491 Malignant neoplasm of unspecified part of right bronchus or lung: Secondary | ICD-10-CM | POA: Diagnosis not present

## 2018-10-17 DIAGNOSIS — I5031 Acute diastolic (congestive) heart failure: Secondary | ICD-10-CM | POA: Diagnosis not present

## 2018-10-17 DIAGNOSIS — I480 Paroxysmal atrial fibrillation: Secondary | ICD-10-CM | POA: Diagnosis not present

## 2018-10-17 DIAGNOSIS — J910 Malignant pleural effusion: Secondary | ICD-10-CM | POA: Diagnosis not present

## 2018-10-17 NOTE — Progress Notes (Signed)
Cardiology Office Note    Date:  10/24/2018   ID:  Brian Ramirez, Brian Ramirez, MRN [id number]  PCP:  [name], MD  Cardiologist: [name], MD EPS: None  No chief complaint on file.   History of Present Illness:  Brian Ramirez is a 73 y.o. male with history of metastatic non-small cell lung CA, right pleural effusion status post Pleurx catheter, anemia was found to have new onset atrial flutter with RVR during blood transfusion for anemia 10/11/2018.  2D echo normal LVEF 55% left atrium was moderately dilated.  Patient was placed on diltiazem and amiodarone initially with plans not to use long-term because of lung cancer.  If he is in normal sinus rhythm at office visit plan is to discontinue amiodarone.  ChadsVasc equals 1 but with his cancer he may be at increased risk of stroke so it was decided to start him on Eliquis.  He did have a bloody pleural effusion.  If he continues to bleed recommend aspirin 81 mg daily.  Lower extremity edema improved with Lasix.  Patient comes in today from [place]. He has an itchy rash on lower extremities.  He is wondering if it is from the chemo he had March 2.  He is tolerating his therapy well and heart rates have been controlled.  He is in normal sinus rhythm today.  Dr. [name] decreased his Lasix to once a day and he has had no further swelling.    Past Medical History:  Diagnosis Date  . Acute congestive heart failure ([facility])   . Anemia of chronic disease 10/08/2018  . Anticoagulated   . Atrial flutter with rapid ventricular response ([facility]) 10/08/2018  . Difficult airway for intubation 09/26/2016  . Edema of both lower extremities 10/09/2018  . Goals of care, counseling/discussion 09/20/2018  . Hernia 2012  . History of hiatal hernia   . History of kidney stones   . Left inguinal hernia s/p lap repair 11/22/2016 09/26/2016  . Lung cancer ([facility]) 09/20/2018  . Lung mass 09/04/2018  . Malnutrition of moderate degree 09/20/2018  .  Mass of right lung 09/04/2018  . New onset atrial fibrillation ([facility])   . Personal history of kidney stones 1992  . Pleural effusion 09/04/2018  . Pleural effusion on right   . Pressure injury of skin 09/20/2018  . SOB (shortness of breath) 09/19/2018    Past Surgical History:  Procedure Laterality Date  . HERNIA REPAIR  2012   inguinal  . INGUINAL HERNIA REPAIR Left 11/22/2016   Procedure: LAPAROSCOPIC  REPAIR OF LEFT INGUINAL HERNIA WITH MESH;  Surgeon: [name], MD;  Location: [facility]S;  Service: General;  Laterality: Left;  . IR INSTILL VIA CHEST TUBE AGENT FOR FIBRINOLYSIS INI DAY  10/10/2018  . IR PERC PLEURAL DRAIN W/INDWELL CATH W/IMG GUIDE  09/11/2018    Current Medications: Current Meds  Medication Sig  . amiodarone (PACERONE) 200 MG tablet Take 200 mg by mouth daily.  Marland Kitchen apixaban (ELIQUIS) 5 MG TABS tablet Take 1 tablet (5 mg total) by mouth 2 (two) times daily.  Marland Kitchen diltiazem (CARDIZEM CD) 240 MG 24 hr capsule Take 1 capsule (240 mg total) by mouth daily.  Marland Kitchen docusate sodium (COLACE) 100 MG capsule Take 100 mg by mouth 2 (two) times daily.  . folic acid (FOLVITE) 1 MG tablet Take 1 tablet (1 mg total) by mouth daily for 30 days.  . furosemide (LASIX) 40 MG tablet Take 1 tablet (40 mg total) by mouth  daily.  . guaiFENesin (MUCINEX) 600 MG 12 hr tablet Take 1 tablet (600 mg total) by mouth 2 (two) times daily.  . Nutritional Supplements (NUTRITIONAL DRINK PO) Take 1 application by mouth 3 (three) times daily.  . polyethylene glycol (MIRALAX / GLYCOLAX) packet Take 17 g by mouth daily as needed for moderate constipation or severe constipation (use first).  . potassium chloride SA (K-DUR,KLOR-CON) 20 MEQ tablet Take 1 tablet (20 mEq total) by mouth daily.  . traMADol (ULTRAM) 50 MG tablet Take 1 tablet (50 mg total) by mouth every 6 (six) hours as needed for moderate pain or severe pain.     Allergies:   Patient has no known allergies.   Social History   Socioeconomic History  .  Marital status: Single    Spouse name: Not on file  . Number of children: Not on file  . Years of education: Not on file  . Highest education level: Not on file  Occupational History  . Not on file  Social Needs  . Financial resource strain: Not on file  . Food insecurity:    Worry: Not on file    Inability: Not on file  . Transportation needs:    Medical: Not on file    Non-medical: Not on file  Tobacco Use  . Smoking status: Never Smoker  . Smokeless tobacco: Never Used  Substance and Sexual Activity  . Alcohol use: Yes    Alcohol/week: 1.0 standard drinks    Types: 1 Glasses of wine per week  . Drug use: No  . Sexual activity: Not on file  Lifestyle  . Physical activity:    Days per week: Not on file    Minutes per session: Not on file  . Stress: Not on file  Relationships  . Social connections:    Talks on phone: Not on file    Gets together: Not on file    Attends religious service: Not on file    Active member of club or organization: Not on file    Attends meetings of clubs or organizations: Not on file    Relationship status: Not on file  Other Topics Concern  . Not on file  Social History Narrative  . Not on file     Family History:  The patient's family history includes Cancer in his brother; Heart disease in his father.   ROS:   Please see the history of present illness.    Review of Systems  Constitution: Positive for malaise/fatigue.  HENT: Negative.   Cardiovascular: Positive for dyspnea on exertion.  Respiratory: Positive for shortness of breath.   Endocrine: Negative.   Hematologic/Lymphatic: Negative.   Musculoskeletal: Negative.   Gastrointestinal: Negative.   Genitourinary: Negative.   Neurological: Negative.    All other systems reviewed and are negative.   PHYSICAL EXAM:   VS:  BP 128/70   Pulse 96   Ht 6\' 1"  (1.854 m)   Wt 159 lb (72.1 kg)   SpO2 98%   BMI 20.98 kg/m   Physical Exam  GEN: Thin, in no acute distress  Neck: no  JVD, carotid bruits, or masses Cardiac:RRR; positive S4  Respiratory: Decreased breath sounds at right lung base clear elsewhere GI: soft, nontender, nondistended, + BS Ext: without cyanosis, clubbing, or edema, Good distal pulses bilaterally Neuro:  Alert and Oriented x 3 Psych: euthymic mood, full affect  Wt Readings from Last 3 Encounters:  10/24/18 159 lb (72.1 kg)  10/15/18 157 lb 11.2 oz (  71.5 kg)  10/11/18 167 lb 5.3 oz (75.9 kg)      Studies/Labs Reviewed:   EKG:  EKG ordered today.  The ekg ordered today demonstrates normal sinus rhythm  Recent Labs: 10/08/2018: TSH 1.509 10/10/2018: B Natriuretic Peptide 360.9 10/11/2018: Magnesium 1.7 10/15/2018: ALT 34; BUN 9; Creatinine 0.69; Hemoglobin 9.4; Platelet Count 427; Potassium 3.9; Sodium 136   Lipid Panel No results found for: CHOL, TRIG, HDL, CHOLHDL, VLDL, LDLCALC, LDLDIRECT  Additional studies/ records that were reviewed today include:  Echo 10/09/2018 1. The left ventricle has a visually estimated ejection fraction of of 55%. The cavity size was normal. Left ventricular diastolic parameters were normal No evidence of left ventricular regional wall motion abnormalities.  2. The right ventricle has normal systolic function. The cavity was normal. There is no increase in right ventricular wall thickness.  3. Left atrial size was moderately dilated.  4. The tricuspid valve is normal in structure.  5. The aortic valve is tricuspid Mild calcification of the aortic valve. no stenosis of the aortic valve.  6. The pulmonic valve was normal in structure.  7. The aortic root and ascending aorta are normal in size and structure.  8. The mitral valve is normal in structure. No evidence of mitral valve stenosis. Trivial mitral regurgitation.  9. Normal IVC size. PA systolic pressure 37 mmHg.     ASSESSMENT:    1. New onset atrial fibrillation ([facility])   2. Acute diastolic congestive heart failure ([facility])   3. Malignant neoplasm of  right lung, unspecified part of lung ([facility])      PLAN:  In order of problems listed above:  New onset atrial fibrillation in the setting of anemia during blood transfusion treated with amiodarone and diltiazem.  Plan is to stop amiodarone if in sinus rhythm which he is so will stop amiodarone.  Chads VASC equals 1 but because of his metastatic lung cancer with clotting started did have a bloody pleural effusion.  Follow-up with Dr. Meda Coffee in 4 to 6 weeks.  Chronic diastolic CHF with some lower extremity edema in the past.  None today.  Lasix was reduced by oncologist and may be able to be stopped completely or to take as needed in the future.  Labs on 10/15/2018 were stable.  Metastatic non-small cell lung CA followed by oncology   Medication Adjustments/Labs and Tests Ordered: Current medicines are reviewed at length with the patient today.  Concerns regarding medicines are outlined above.  Medication changes, Labs and Tests ordered today are listed in the Patient Instructions below. There are no Patient Instructions on file for this visit.   Sumner Boast, PA-C  10/24/2018 11:39 AM    [facility] [place], [place], [place]  [address] Phone: [phone]; Fax: [phone]

## 2018-10-17 NOTE — Patient Outreach (Signed)
[facility]) Care Management  10/17/2018  Brian Ramirez 1945/12/15 [id number]   Onsite visit for IDT meeting at [facility], SW reports patient lives alone, has not close family.  He also has stairs in home with bath upstairs.  He is new to oxygen and she is going to check on getting portable oxygen that will be easy to manage in the home.  Brian Ramirez states patient will need private pay caregivers or ALF.  Brian Ramirez would like a [facility] referral for SW to assist patient, he has transportation needs, caregiver resources needs.  Patient has lung cancer is goes to cancer center for chemo.   Attempted to meet with patient, he was not in his room.  Left a packet in room with RNCM contact. Plan to place a [facility] SW referral for patient to get a call for assessment of social work needs around those discussed with facility Education officer, museum.   Brian Ramirez. Brian Purser, MSN, AGNP-C, [place] [phone]) Business Cell  [phone]) Toll Free Office

## 2018-10-23 DIAGNOSIS — I480 Paroxysmal atrial fibrillation: Secondary | ICD-10-CM | POA: Diagnosis not present

## 2018-10-23 DIAGNOSIS — L298 Other pruritus: Secondary | ICD-10-CM | POA: Diagnosis not present

## 2018-10-23 DIAGNOSIS — J910 Malignant pleural effusion: Secondary | ICD-10-CM | POA: Diagnosis not present

## 2018-10-23 DIAGNOSIS — C3491 Malignant neoplasm of unspecified part of right bronchus or lung: Secondary | ICD-10-CM | POA: Diagnosis not present

## 2018-10-24 ENCOUNTER — Encounter: Payer: Self-pay | Admitting: Physician Assistant

## 2018-10-24 ENCOUNTER — Ambulatory Visit (INDEPENDENT_AMBULATORY_CARE_PROVIDER_SITE_OTHER): Payer: Medicare Other | Admitting: Physician Assistant

## 2018-10-24 VITALS — BP 128/70 | HR 96 | Ht 73.0 in | Wt 159.0 lb

## 2018-10-24 DIAGNOSIS — I5031 Acute diastolic (congestive) heart failure: Secondary | ICD-10-CM | POA: Diagnosis not present

## 2018-10-24 DIAGNOSIS — I4891 Unspecified atrial fibrillation: Secondary | ICD-10-CM

## 2018-10-24 DIAGNOSIS — C3491 Malignant neoplasm of unspecified part of right bronchus or lung: Secondary | ICD-10-CM | POA: Diagnosis not present

## 2018-10-24 NOTE — Patient Instructions (Addendum)
Medication Instructions:  1) STOP AMIODARONE  Labwork: None  Testing/Procedures: None  Follow-Up: You have an appointment with Dr. Meda Coffee on December 06, 2018 at 3:30PM.

## 2018-10-25 ENCOUNTER — Encounter: Payer: Self-pay | Admitting: *Deleted

## 2018-10-25 ENCOUNTER — Inpatient Hospital Stay: Payer: Medicare Other

## 2018-10-25 ENCOUNTER — Telehealth: Payer: Self-pay | Admitting: Nurse Practitioner

## 2018-10-25 ENCOUNTER — Other Ambulatory Visit: Payer: Self-pay | Admitting: *Deleted

## 2018-10-25 ENCOUNTER — Other Ambulatory Visit: Payer: Self-pay

## 2018-10-25 ENCOUNTER — Ambulatory Visit (HOSPITAL_COMMUNITY)
Admission: RE | Admit: 2018-10-25 | Discharge: 2018-10-25 | Disposition: A | Discharge status: Home / Self Care | Payer: No Typology Code available for payment source | Source: Ambulatory Visit | Attending: Oncology | Admitting: Oncology

## 2018-10-25 ENCOUNTER — Inpatient Hospital Stay (HOSPITAL_BASED_OUTPATIENT_CLINIC_OR_DEPARTMENT_OTHER): Payer: Medicare Other | Admitting: Nurse Practitioner

## 2018-10-25 ENCOUNTER — Encounter: Payer: Self-pay | Admitting: Nurse Practitioner

## 2018-10-25 VITALS — BP 129/74 | HR 96 | Temp 98.7°F | Resp 18 | Ht 73.0 in | Wt 149.6 lb

## 2018-10-25 DIAGNOSIS — C3491 Malignant neoplasm of unspecified part of right bronchus or lung: Secondary | ICD-10-CM | POA: Diagnosis not present

## 2018-10-25 DIAGNOSIS — D649 Anemia, unspecified: Secondary | ICD-10-CM | POA: Diagnosis not present

## 2018-10-25 DIAGNOSIS — Z5111 Encounter for antineoplastic chemotherapy: Secondary | ICD-10-CM | POA: Diagnosis not present

## 2018-10-25 DIAGNOSIS — Z79899 Other long term (current) drug therapy: Secondary | ICD-10-CM

## 2018-10-25 DIAGNOSIS — J90 Pleural effusion, not elsewhere classified: Secondary | ICD-10-CM | POA: Diagnosis not present

## 2018-10-25 DIAGNOSIS — R0602 Shortness of breath: Secondary | ICD-10-CM | POA: Diagnosis not present

## 2018-10-25 DIAGNOSIS — Z5112 Encounter for antineoplastic immunotherapy: Secondary | ICD-10-CM | POA: Diagnosis not present

## 2018-10-25 DIAGNOSIS — R5383 Other fatigue: Secondary | ICD-10-CM | POA: Diagnosis not present

## 2018-10-25 LAB — CBC WITH DIFFERENTIAL (CANCER CENTER ONLY)
ABS IMMATURE GRANULOCYTES: 0.08 10*3/uL — AB (ref 0.00–0.07)
Basophils Absolute: 0 10*3/uL (ref 0.0–0.1)
Basophils Relative: 0 %
Eosinophils Absolute: 0.1 10*3/uL (ref 0.0–0.5)
Eosinophils Relative: 2 %
HCT: 28.6 % — ABNORMAL LOW (ref 39.0–52.0)
Hemoglobin: 8.3 g/dL — ABNORMAL LOW (ref 13.0–17.0)
Immature Granulocytes: 2 %
Lymphocytes Relative: 9 %
Lymphs Abs: 0.4 10*3/uL — ABNORMAL LOW (ref 0.7–4.0)
MCH: 26.1 pg (ref 26.0–34.0)
MCHC: 29 g/dL — ABNORMAL LOW (ref 30.0–36.0)
MCV: 89.9 fL (ref 80.0–100.0)
Monocytes Absolute: 0.9 10*3/uL (ref 0.1–1.0)
Monocytes Relative: 19 %
NEUTROS PCT: 68 %
Neutro Abs: 3 10*3/uL (ref 1.7–7.7)
Platelet Count: 269 10*3/uL (ref 150–400)
RBC: 3.18 MIL/uL — ABNORMAL LOW (ref 4.22–5.81)
RDW: 16.8 % — ABNORMAL HIGH (ref 11.5–15.5)
WBC Count: 4.4 10*3/uL (ref 4.0–10.5)
nRBC: 0 % (ref 0.0–0.2)

## 2018-10-25 LAB — BASIC METABOLIC PANEL - CANCER CENTER ONLY
Anion gap: 10 (ref 5–15)
BUN: 13 mg/dL (ref 8–23)
CO2: 32 mmol/L (ref 22–32)
Calcium: 8.9 mg/dL (ref 8.9–10.3)
Chloride: 96 mmol/L — ABNORMAL LOW (ref 98–111)
Creatinine: 0.68 mg/dL (ref 0.61–1.24)
GFR, Est AFR Am: >60 mL/min (ref >60)
GFR, Estimated: >60 mL/min (ref >60)
Glucose, Bld: 142 mg/dL — ABNORMAL HIGH (ref 70–99)
Potassium: 4.2 mmol/L (ref 3.5–5.1)
Sodium: 138 mmol/L (ref 135–145)

## 2018-10-25 LAB — SAMPLE TO BLOOD BANK

## 2018-10-25 NOTE — Progress Notes (Addendum)
[place] [facility] OFFICE PROGRESS NOTE   Diagnosis: Non-small cell lung cancer  INTERVAL HISTORY:   Brian Ramirez returns as scheduled.  He completed cycle 2 of Alimta/carboplatin/pembrolizumab 10/15/2018.  He denies nausea/vomiting.  No mouth sores.  No diarrhea.  About 5 days ago he developed a pruritic rash below the knees.  He has been prescribed Atarax.  He denies shortness of breath.  He reports minimal to no output from the Pleurx over the past 5 days.  He has a good appetite but reports continued weight loss.  Leg swelling is markedly improved.  Objective:  Vital signs in last 24 hours:  Blood pressure 129/74, pulse 96, temperature 98.7 F (37.1 C), temperature source Oral, resp. rate 18, height 6' 1" (1.854 m), weight 149 lb 9.6 oz (67.9 kg), SpO2 100 %.    HEENT: No thrush or ulcers. Resp: Breath sounds diminished about the right lung field.  No respiratory distress. Cardio: Irregular. GI: Abdomen soft and nontender.  No hepatomegaly. Vascular: Trace edema at the lower legs bilaterally.  Skin: Flat erythematous rash abdomen and legs.   Lab Results:  Lab Results  Component Value Date   WBC 4.4 10/25/2018   HGB 8.3 (L) 10/25/2018   HCT 28.6 (L) 10/25/2018   MCV 89.9 10/25/2018   PLT 269 10/25/2018   NEUTROABS 3.0 10/25/2018    Imaging:  No results found.  Medications: I have reviewed the patient's current medications.  Assessment/Plan: 1.Metastatic non-small cell lung cancer  largeright pleural effusion, right lung mass, right pleural-based masses, left lung nodules  Right thoracentesis 09/04/2018-negative cytology  CT biopsy of right lateral pleural mass 09/07/2018-poorly differentiated non-small cell carcinoma, malignant cells positive for cytokeratin AE1/AE3 and cytokeratin 8/18, histology favoring adenocarcinoma, MSS, tumor mutation burden 5.no EGFR, ALK, BRAF alteration.  PDL 1 rearrangement  PDL 1 tumor proportion score-100%   Cycle 1  Alimta/carboplatin 09/22/2018, pembrolizumab 09/28/2018  Cycle 2 Alimta/carboplatin/pembrolizumab 10/15/2018  2.Severe anemia-likely secondary to bleeding in the right pleural space and metastatic carcinoma, red cell transfusions 09/09/2018 09/10/2018, 09/23/2018 3.Dyspnea secondary to the large right pleural effusion and anemia  Right Pleurx catheter placed 09/11/2018  Chest x-ray 10/09/2018- large right pleural effusion, loculated 4.History of tobacco use 5.History of kidney stones 6. Fever-most likely tumor fever, improved 7. Admission 10/08/2018 with rapid atrial fibrillation/flutter- improved with diltiazem, started on Eliquis anticoagulation  Disposition: Brian Ramirez has completed 2 cycles of Alimta/carboplatin/pembrolizumab.  He has an improved performance status.    He is having minimal drainage from the Pleurx catheter.  On exam breath sounds continue to be markedly diminished throughout the right lung field.  We are referring him for a chest x-ray.  He has a skin rash.  This may be related to the Pembrolizumab.  He will contact the office if this worsens prior to his next visit.  Leg swelling is much better.  He will discontinue Lasix and potassium.  At today's visit he reports he is being discharged home 10/27/2018.  He has concerns regarding his ability to get food, transportation to his appointments, care of the Pleurx catheter and home oxygen therapy.  We recommend he speak with the social Development worker, community at the nursing facility.  He will return for lab, follow-up and cycle 3 Alimta/carboplatin/pembrolizumab on 11/06/2018.  He will contact the office in the interim with any problems.  Patient seen with Dr. [name].  25 minutes were spent face-to-face at today's visit with the majority of that time involved in counseling/coordination of care.  Brian Ramirez ANP/GNP-BC   10/25/2018  11:20 AM  This was a shared visit with Brian Ramirez.  Brian Ramirez was interviewed and  examined.  His performance status continues to improve.  Hopefully this indicates a response to the systemic therapy.  We will check a chest x-ray today to look for remaining pleural fluid.  He may need to undergo repeat thrombolytic therapy to the pleural catheter.  He will discontinue potassium and Lasix.  The skin rash may be related to a benign dermatitis or pembrolizumab.  He will be scheduled for an office visit prior to the next cycle of systemic therapy 11/06/2018.  Julieanne Manson, MD

## 2018-10-25 NOTE — Patient Outreach (Signed)
[facility]) Care Management  10/25/2018  Brian Ramirez May 17, 1946 [id number]   CSW was able to make initial contact with patient today to perform phone assessment, as well as assess and assist with social work needs and services.  CSW introduced self, explained role and types of services provided through [facility] ([place] [place] [facility]).  CSW further explained to patient that CSW works with patient's [place] coordinator, also with [place] [place] [facility], Burgess Amor. CSW then explained the reason for the call, indicating that Mrs. Niemczura thought that patient would benefit from social work services and resources to assist with discharge planning needs from the skilled nursing facility.  CSW obtained two HIPAA compliant identifiers from patient, which included patient's name and date of birth.  CSW explained to patient that CSW would have preferred to meet with patient in person at [facility] where patient currently resides to receive short-term rehabilitative services; however, there is currently a restriction on visitors at the facility due to the Coronavirus.  Patient voiced understanding, indicating that he is actually planning to be discharged on Saturday, October 27, 2018, returning home to live alone.  Patient went on to say that all his discharge planning arrangements are in place and that home health services will be resumed through [facility].  These services include a home health nurse, physical therapist, occupational therapist and an aide.  Patient did not wish to consider private in-home care services, nor was patient interested in receiving placement into an assisted living facility.  Patient reported that he has already spoken with Vickii Chafe, Social Curator at [facility], and that she is  making arrangements for patient to receive a wheelchair and portable oxygen tanks through [facility].  Patient admits to having transportation to and from all his physician appointments and indicated that he is able to manage his own medications.  Patient assured CSW that he is able to perform all activities of daily living independently, preparing all his meals, preforming light housekeeping duties, grocery shopping, etc.  CSW agreed to follow-up with patient again on Wednesday, October 31, 2018 to ensure that he has everything he needs to be successful in the home.  Nat Christen, BSW, MSW, LCSW  Licensed Education officer, [facility]  Mailing [place] [address] [place], [place] [address] Physical Address-[address], [place], [place] [address] Toll Free Main # [phone] Fax # [phone] Cell # [phone]  Office # [phone] [email or web address]

## 2018-10-25 NOTE — Telephone Encounter (Signed)
Scheduled appt per 3/12 los.  Printed calendar and avs.

## 2018-10-26 ENCOUNTER — Other Ambulatory Visit (HOSPITAL_COMMUNITY): Payer: Self-pay | Admitting: Medical

## 2018-10-26 DIAGNOSIS — L298 Other pruritus: Secondary | ICD-10-CM | POA: Diagnosis not present

## 2018-10-26 DIAGNOSIS — I480 Paroxysmal atrial fibrillation: Secondary | ICD-10-CM | POA: Diagnosis not present

## 2018-10-26 DIAGNOSIS — J910 Malignant pleural effusion: Secondary | ICD-10-CM | POA: Diagnosis not present

## 2018-10-26 DIAGNOSIS — C3491 Malignant neoplasm of unspecified part of right bronchus or lung: Secondary | ICD-10-CM | POA: Diagnosis not present

## 2018-10-29 ENCOUNTER — Other Ambulatory Visit (HOSPITAL_COMMUNITY): Payer: Self-pay | Admitting: Interventional Radiology

## 2018-10-29 ENCOUNTER — Other Ambulatory Visit (HOSPITAL_COMMUNITY): Payer: Self-pay | Admitting: Radiology

## 2018-10-29 DIAGNOSIS — Z48813 Encounter for surgical aftercare following surgery on the respiratory system: Secondary | ICD-10-CM | POA: Diagnosis not present

## 2018-10-29 DIAGNOSIS — J910 Malignant pleural effusion: Secondary | ICD-10-CM

## 2018-10-29 DIAGNOSIS — C3411 Malignant neoplasm of upper lobe, right bronchus or lung: Secondary | ICD-10-CM | POA: Diagnosis not present

## 2018-10-29 DIAGNOSIS — J181 Lobar pneumonia, unspecified organism: Secondary | ICD-10-CM | POA: Diagnosis not present

## 2018-10-29 DIAGNOSIS — J9621 Acute and chronic respiratory failure with hypoxia: Secondary | ICD-10-CM | POA: Diagnosis not present

## 2018-10-29 DIAGNOSIS — D638 Anemia in other chronic diseases classified elsewhere: Secondary | ICD-10-CM | POA: Diagnosis not present

## 2018-10-30 ENCOUNTER — Other Ambulatory Visit: Payer: Self-pay

## 2018-10-30 DIAGNOSIS — J910 Malignant pleural effusion: Secondary | ICD-10-CM | POA: Diagnosis not present

## 2018-10-30 DIAGNOSIS — C3411 Malignant neoplasm of upper lobe, right bronchus or lung: Secondary | ICD-10-CM | POA: Diagnosis not present

## 2018-10-30 DIAGNOSIS — D638 Anemia in other chronic diseases classified elsewhere: Secondary | ICD-10-CM | POA: Diagnosis not present

## 2018-10-30 DIAGNOSIS — Z48813 Encounter for surgical aftercare following surgery on the respiratory system: Secondary | ICD-10-CM | POA: Diagnosis not present

## 2018-10-30 DIAGNOSIS — J181 Lobar pneumonia, unspecified organism: Secondary | ICD-10-CM | POA: Diagnosis not present

## 2018-10-30 DIAGNOSIS — J9621 Acute and chronic respiratory failure with hypoxia: Secondary | ICD-10-CM | POA: Diagnosis not present

## 2018-10-30 NOTE — Patient Outreach (Signed)
[facility]) Care Management  10/30/2018  Brian Ramirez 1946-02-01 [id number]   EMMI-General Discharge RED ON EMMI ALERT Day # 1 Date: 10/29/2018 Red Alert Reason: " Know who to call about changes in condition? No"   Incoming call from pt returning RN CM call. He voices that he is doing fairly well. Patient complains of a sore throat and hoarseness. He denies any major acute issues or concerns at this time. RN CM reviewed and addressed red alert with patient. Patient voices he did not understand the question. He states that he has contact info for PCP and oncologist. He goes to cancer center appt tomorrow from 9am-2pm. He reports that he has paid transportation to take him to appt. However, he is concerned about future appts including PCP appt. He has not scheduled PCP appt as he is unable to drive at this time and does not know how he will be able to get there. [facility] SW reached out to patient while still at SNF to assess needs and per plan has follow up call scheduled with patient. Advised patient that RN CM would alert SW of transportation concerns to see if resources available. Patient states he is getting [facility] services. He confirmed that he has all his meds and no issues or concerns regarding them. No further RN CM needs or concerns at this time. Advised patient that they would get one more automated EMMI-GENERAL post discharge calls to assess how they are doing following recent hospitalization and will receive a call from a nurse if any of their responses were abnormal. Patient voiced understanding and was appreciative of f/u call.       Plan: RN CM will close case as no further interventions needed at this time. RN CM will send message to assigned [facility] SW to request follow up in regards to transportation.    [name], RN,BSN,CCM [place] Management Telephonic Care Management Coordinator Direct Phone: [phone] Toll Free: [phone] Fax:  [phone]

## 2018-10-30 NOTE — Patient Outreach (Signed)
[facility]) Care Management  10/30/2018  Brian Ramirez 1945-10-28 [id number]    EMMI-General Discharge RED ON EMMI ALERT Day # 1 Date: 10/29/2018 Red Alert Reason: " Know who to call about changes in condition? No"   Outreach attempt # 1 to patient. No answer. RN CM left HIPAA compliant voicemail message along with contact info.     Plan: RN CM will make outreach attempt to patient within 3-4 business days. RN CM will send unsuccessful outreach letter to patient.   [name], RN,BSN,CCM [place] Management Telephonic Care Management Coordinator Direct Phone: [phone] Toll Free: [phone] Fax: [phone]

## 2018-10-31 ENCOUNTER — Other Ambulatory Visit: Payer: Self-pay

## 2018-10-31 ENCOUNTER — Encounter (HOSPITAL_COMMUNITY): Payer: Self-pay | Admitting: Interventional Radiology

## 2018-10-31 ENCOUNTER — Encounter: Payer: Self-pay | Admitting: *Deleted

## 2018-10-31 ENCOUNTER — Ambulatory Visit (HOSPITAL_COMMUNITY)
Admission: RE | Admit: 2018-10-31 | Discharge: 2018-10-31 | Disposition: A | Discharge status: Home / Self Care | Payer: Medicare Other | Source: Ambulatory Visit | Attending: Radiology | Admitting: Radiology

## 2018-10-31 ENCOUNTER — Ambulatory Visit (HOSPITAL_COMMUNITY)
Admission: RE | Admit: 2018-10-31 | Discharge: 2018-10-31 | Disposition: A | Discharge status: Home / Self Care | Payer: Medicare Other | Source: Ambulatory Visit | Attending: Diagnostic Radiology | Admitting: Diagnostic Radiology

## 2018-10-31 ENCOUNTER — Other Ambulatory Visit: Payer: Self-pay | Admitting: *Deleted

## 2018-10-31 DIAGNOSIS — Z85118 Personal history of other malignant neoplasm of bronchus and lung: Secondary | ICD-10-CM | POA: Diagnosis not present

## 2018-10-31 DIAGNOSIS — J910 Malignant pleural effusion: Secondary | ICD-10-CM | POA: Insufficient documentation

## 2018-10-31 DIAGNOSIS — T85698A Other mechanical complication of other specified internal prosthetic devices, implants and grafts, initial encounter: Secondary | ICD-10-CM | POA: Diagnosis not present

## 2018-10-31 HISTORY — PX: IR INSTILL VIA CHEST TUBE AGENT FOR FIBRINOLYSIS INI DAY: IMG5400

## 2018-10-31 MED ORDER — SODIUM CHLORIDE (PF) 0.9 % IJ SOLN
8.0000 mg | Freq: Once | INTRAMUSCULAR | Status: AC
  Administered 2018-10-31: 8 mg via INTRAPLEURAL
  Filled 2018-10-31: qty 8

## 2018-10-31 NOTE — Discharge Instructions (Signed)
Indwelling Pleural Catheter Home Guide  An indwelling pleural catheter is a thin, flexible tube that is inserted under your skin and into your chest. The catheter drains excess fluid that collects in the area between the chest wall and the lungs (pleural space).After the catheter is inserted, it can be attached to a bottle that collects fluid. The pleural catheter will allow you to drain fluid from your chest at home on a regular basis (sometimes daily). This will eliminate the need for frequent visits to the hospital or clinic to drain the fluid. The catheter may be removed after the excess fluid problem is resolved, usually after 2-3 months. It is important to follow instructions from your health care provider about how to drain and care for your catheter. What are the risks? Generally, this is a safe procedure. However, problems may occur, including:  Infection.  Skin damage around the catheter.  Lung damage.  Failure of the chest tube to work properly.  Spreading of cancer cells along the catheter, if you have cancer. Supplies needed:  Vacuum-sealed drainage bottle with attached drainage line.  Sterile dressing.  Sterile alcohol pads.  Sterile gloves.  Valve cap.  Sterile gauze pads, 4  4 inch (10 cm  10 cm).  Tape.  Adhesive dressing.  Sterile foam catheter pad. How to care for your catheter and insertion site  Wash your hands with soap and warm water before and after touching the catheter or insertion site. If soap and water are not available, use hand sanitizer.  Check your bandage (dressing) daily to make sure it is clean and dry.  Keep the skin around the catheter clean and dry.  Check the catheter regularly for any cracks or kinks in the tubing.  Check your catheter insertion site every day for signs of infection. Check for: ? Skin breakdown. ? Redness, swelling, or pain. ? Fluid or blood. ? Warmth. ? Pus or a bad smell. How to drain your catheter You  may need to drain your catheter every day, or more or less often as told by your health care provider. Follow instructions from your health care provider about how to drain your catheter. You may also refer to instructions that come with the drainage system. To drain the catheter: 1. Wash your hands with soap and warm water. If soap and water are not available, use hand sanitizer. 2. Carefully remove the dressing from around the catheter. 3. Wash your hands again. 4. Put on the gloves provided. 5. Prepare the vacuum-sealed drainage bottle and drainage line. Close the drainage line of the vacuum-sealed drainage bottle by squeezing the pinch clamp or rolling the wheel of the roller clamp toward the bottle. The vacuum in the bottle will be lost if the line is not closed completely. 6. Remove the access tip cover from the drainage line. Do not touch the end. Set it on a sterile surface. 7. Remove the catheter valve cap and throw it away. 8. Use an alcohol pad to clean the end of the catheter. 9. Insert the access tip into the catheter valve. Make sure the valve and access tip are securely connected. Listen for a click to confirm that they are connected. 10. Insert the T plunger to break the vacuum seal on the drainage bottle. 11. Open the clamp on the drainage line. 12. Allow the catheter to drain. Keep the catheter and the drainage bottle below the level of your chest. There may be a one-way valve on the end of the  tubing that will allow liquid and air to flow out of the catheter without letting air inside. 13. Drain the amount of fluid as told by your health care provider. It usually takes 5-15 minutes. Do not drain more than 1000 mL of fluid. You may feel a little discomfort while you are draining. If the pain is severe, stop draining and contact your health care provider. 14. After you finish draining the catheter, remove the drainage bottle tubing from the catheter. 15. Use a clean alcohol pad to  wipe the catheter tip. 16. Place a clean cap on the end of the catheter. 17. Use an alcohol pad to clean the skin around the catheter. 18. Allow the skin to air-dry. 19. Put the catheter pad on your skin. Curl the catheter into loops and place it on the pad. Do not place the catheter on your skin. 20. Replace the dressing over the catheter. 21. Discard the drainage bottle as instructed by your health care provider. Do not reuse the drainage bottle. How to change your dressing Change your dressing at least once a week, or more often if needed to keep the dressing dry. Be sure to change the dressing whenever it becomes moist. Your health care provider will tell you how often to change your dressing. 1. Wash your hands with soap and warm water. If soap and water are not available, use hand sanitizer. 2. Gently remove the old dressing. Avoid using scissors to remove the dressing. Sharp objects may damage the catheter. 3. Wash the skin around the insertion site with mild, fragrance-free soap and warm water. Rinse well, then pat the area dry with a clean cloth. 4. Check the skin around the catheter for signs of infection. Check for: ? Skin breakdown. ? Redness, swelling, or pain. ? Fluid or blood. ? Warmth. ? Pus or a bad smell. 5. If your catheter was stitched (sutured) to your skin, look at the suture to make sure it is still anchored in your skin. 6. Do not apply creams, ointments, or alcohol to the area. Let your skin air-dry completely before you apply a new dressing. 7. Curl the catheter into loops and place it on the sterile catheter pad. Do not place the catheter on your skin. 8. If you do not have a pad, use a clean dressing. Slide the dressing under the disk that holds the drainage catheter in place. 9. Use gauze to cover the catheter and the catheter pad. The catheter should rest on the pad or dressing, not on your skin. 10. Tape the dressing to your skin. You may be instructed to use an  adhesive dressing covering instead of gauze and tape. 11. Wash your hands with soap and warm water. If soap and water are not available, use hand sanitizer. General recommendations  Always wash your hands with soap and warm water before and after caring for your catheter and drainage bottle. Use a mild, fragrance-free soap. If soap and water are not available, use hand sanitizer.  Always make sure there are no leaks in the catheter or drainage bottle.  Each time you drain the catheter, note the color and amount of fluid.  Do not touch the tip of the catheter or the drainage bottle tubing.  Do not reuse drainage bottles.  Do not take baths, swim, or use a hot tub until your health care provider approves. Ask your health care provider if you may take showers. You may only be allowed to take sponge baths.  Take  deep breaths regularly, followed by a cough. Doing this can help to prevent lung infection. Contact a health care provider if:  You have any questions about caring for your catheter or drainage bottle.  You still have pain at the catheter insertion site more than 2 days after your procedure.  You have pain while draining your catheter.  Your catheter becomes bent, twisted, or cracked.  The connection between the catheter and the collection bottle becomes loose.  You have any of these around your catheter insertion site or coming from it: ? Skin breakdown. ? Redness, swelling, or pain. ? Fluid or blood. ? Warmth. ? Pus or a bad smell. Get help right away if:  You have a fever or chills.  You have chest pain.  You have dizziness or shortness of breath.  You have severe redness, swelling, or pain at your catheter insertion site.  The catheter comes out.  The catheter is blocked or clogged. Summary  An indwelling pleural catheter is a thin, flexible tube that is inserted under your skin and into your chest. The catheter drains excess fluid that collects in the area  between the chest wall and the lungs (pleural space).  It is important to follow instructions from your health care provider about how to drain and care for your catheter.  Do not touch the tip of the catheter or the drainage bottle tubing.  Always wash your hands with soap and water before and after caring for your catheter and drainage bottle. If soap and water are not available, use hand sanitizer. This information is not intended to replace advice given to you by your health care provider. Make sure you discuss any questions you have with your health care provider. Document Released: 11/24/2016 Document Revised: 11/24/2016 Document Reviewed: 11/24/2016 Elsevier Interactive Patient Education  [employer].

## 2018-10-31 NOTE — Patient Outreach (Signed)
[place] [facility]) Care Management  10/31/2018  Brian Ramirez 10/04/1945 [id number]   CSW was able to make contact with patient today to follow-up regarding social work services and resources, as well as to ensure that patient had a smooth transition back home from [facility] [place] where patient was residing to receive short-term rehabilitative services.  Patient admitted that he is doing well and that home health services through [facility] have been resumed.    Patient reported having undergone a medical procedure this morning, called a Tissue Plasminogen Activator.  Patient went on to say that the procedure was performed to extract fluid from his right pleurx drain.  Patient has a known history of Metastatic Non-Small Cell Lung Cancer with recurrent malignant right pleural effusion.   Patient indicated that transportation is definitely a barrier for him at this time, as he is unable to drive himself to and from his physician appointments.  CSW explained to patient that CSW will place a referral for patient to [employer], social work Social worker, also with [employer], that can provide patient with transportation resources, as well as assist with transportation arrangements, if necessary.  Patient voiced understanding and was agreeable to this plan.  Patient appeared to be in good spirits today, denying experiencing symptoms of anxiety and/or depression.  CSW explained to patient that there are support groups offered through the [facility], agreeing to provide patient with a list, as well as put him in contact with the social work department, if agreeable.  Patient did not appear to be interested at this time, but indicated that he would make arrangements to inquire about various services, if interested, in the near future.   CSW will perform a case closure on patient, as  all goals of treatment have been met from social work standpoint and no additional social work needs have been identified at this time.  CSW will notify patient's Telephonic RNCM with [facility], [name] of CSW's plans to close patient's case.  CSW will fax an update to patient's Primary Care Physician, Dr. Lauretta Grill K[place] to ensure that they are aware of CSW's involvement with patient's plan of care.    Brian Ramirez, BSW, MSW, LCSW  Licensed Education officer, [facility]  Mailing [place] [address], [place], [place] [address] Physical Address-[address], [place], [place] [address] Toll Free Main # [phone] Fax # [phone] Cell # [phone]  Office # [phone] [email or web address]

## 2018-10-31 NOTE — Progress Notes (Signed)
Patient ID: Brian Ramirez, male   DOB: 09-26-1945, 73 y.o.   MRN: [id number] Patient presented to IR department today as a referral from Dr. [name] for repeat administration of TPA into right Pleurx drain to expedite output.  Patient underwent similar procedure on 10/10/2018 yielding 1 L of fluid afterwards.  He has a known history of metastatic non-small cell lung cancer with recurrent malignant right pleural effusion.  Pleurx catheter was placed on 09/11/2018.  Pt denies any recent bleeding.  8 mg TPA in 60 cc normal saline was instilled into the right Pleurx catheter without immediate complications.  Pleurx will be clamped for the next 2 hours, patient will be observed in short stay center and we will reassess for drainage afterwards.

## 2018-10-31 NOTE — Progress Notes (Addendum)
Patient ID: Brian Ramirez, male   DOB: 12-09-1945, 73 y.o.   MRN: [id number] Approximately 600 cc of bloody fluid was aspirated from patient's right Pleurx following TPA dwell. He was instructed to continue with as needed drainage for now and should problems arise again contact oncology. May necessitate Pleurx removal.  Patient was discharged afterwards in stable condition.VSS.  Findings discussed with Dr. Earleen Newport.

## 2018-10-31 NOTE — Progress Notes (Addendum)
Patient received from IR after getting TPA into his right Pleurx catheter drain. He was instructed by PA to roll from right to left side every 15 minutes. Rowe Robert PA is to return in 2 hours to see if catheter drains. Patient has [facility] and has home visit scheduled for 11/02/2018.  1100  Right   1115 left   1130 right   1145  Left  1200  Right  1215  Left  1230  Right  1245   left

## 2018-11-01 ENCOUNTER — Other Ambulatory Visit: Payer: Self-pay

## 2018-11-01 NOTE — Patient Outreach (Signed)
[facility]) Care Management  11/01/2018  TELL Brian Ramirez May 20, 1946 [id number]   Unsuccessful outreach to patient regarding social work referral for transportation resources.  BSW left voicemail message.  Will attempt to reach again within four business days.  Ronn Melena, BSW Social Worker [phone]

## 2018-11-02 DIAGNOSIS — C3411 Malignant neoplasm of upper lobe, right bronchus or lung: Secondary | ICD-10-CM | POA: Diagnosis not present

## 2018-11-02 DIAGNOSIS — D638 Anemia in other chronic diseases classified elsewhere: Secondary | ICD-10-CM | POA: Diagnosis not present

## 2018-11-02 DIAGNOSIS — J9621 Acute and chronic respiratory failure with hypoxia: Secondary | ICD-10-CM | POA: Diagnosis not present

## 2018-11-02 DIAGNOSIS — J910 Malignant pleural effusion: Secondary | ICD-10-CM | POA: Diagnosis not present

## 2018-11-02 DIAGNOSIS — J181 Lobar pneumonia, unspecified organism: Secondary | ICD-10-CM | POA: Diagnosis not present

## 2018-11-02 DIAGNOSIS — Z48813 Encounter for surgical aftercare following surgery on the respiratory system: Secondary | ICD-10-CM | POA: Diagnosis not present

## 2018-11-04 ENCOUNTER — Other Ambulatory Visit: Payer: Self-pay | Admitting: Oncology

## 2018-11-04 DIAGNOSIS — Z48813 Encounter for surgical aftercare following surgery on the respiratory system: Secondary | ICD-10-CM | POA: Diagnosis not present

## 2018-11-04 DIAGNOSIS — J181 Lobar pneumonia, unspecified organism: Secondary | ICD-10-CM | POA: Diagnosis not present

## 2018-11-04 DIAGNOSIS — J910 Malignant pleural effusion: Secondary | ICD-10-CM | POA: Diagnosis not present

## 2018-11-04 DIAGNOSIS — C3411 Malignant neoplasm of upper lobe, right bronchus or lung: Secondary | ICD-10-CM | POA: Diagnosis not present

## 2018-11-04 DIAGNOSIS — D638 Anemia in other chronic diseases classified elsewhere: Secondary | ICD-10-CM | POA: Diagnosis not present

## 2018-11-04 DIAGNOSIS — J9621 Acute and chronic respiratory failure with hypoxia: Secondary | ICD-10-CM | POA: Diagnosis not present

## 2018-11-05 ENCOUNTER — Ambulatory Visit: Payer: Self-pay

## 2018-11-05 ENCOUNTER — Other Ambulatory Visit: Payer: Medicare Other

## 2018-11-05 ENCOUNTER — Other Ambulatory Visit: Payer: Self-pay

## 2018-11-05 NOTE — Patient Outreach (Signed)
[facility]) Care Management  11/05/2018  Brian Ramirez Jan 15, 1946 [id number]   Return call from patient regarding social work referral for transportation resources.  Patient reported that he has been contacted multiple times by his primary care provider regarding scheduling an appointment.  Per patient, he is unsure of the reason for the appointment unless it is due to prescriptions needing be to renewed.  He voiced that he prefers to not go to MD office at this time due to COVD-19.  BSW encouraged him to call provider about this.   Patient has cardiology appointment on 12/06/18 and reported no transportation for this appointment.  BSW and patient talked about applying for SCAT services but he will not be approved for services by this date especially due to current delays in processing applications.  Patient reported that he is unable to navigate stairs at this time.  BSW informed him that transportation can be arranged via [facility], however, they will not provide "hands on" assistance so he would need to have someone at the home to assist with getting to the vehicle.  BSW and patient decided that a follow up call will be conducted on 12/03/18 to determine if he is better abel to navigate stairs at that time or if someone can be at his home to assist.  If appropriate at this time, transportation will be arranged  via [place], [place] Worker [phone]

## 2018-11-05 NOTE — Patient Outreach (Signed)
[name]  11/05/2018  Brian Ramirez 09-Jul-1946 [id number]   Second unsuccessful outreach to patient regarding social work referral for transportation resources for MD appointments.  BSW left voicemail message.  Will make final attempt to reach again within four business days.  Ronn Melena, BSW Social Worker [phone]

## 2018-11-06 ENCOUNTER — Inpatient Hospital Stay: Payer: Medicare Other

## 2018-11-06 ENCOUNTER — Ambulatory Visit (HOSPITAL_COMMUNITY)
Admission: RE | Admit: 2018-11-06 | Discharge: 2018-11-06 | Disposition: A | Discharge status: Home / Self Care | Payer: Medicare Other | Source: Ambulatory Visit | Attending: Oncology | Admitting: Oncology

## 2018-11-06 ENCOUNTER — Inpatient Hospital Stay (HOSPITAL_BASED_OUTPATIENT_CLINIC_OR_DEPARTMENT_OTHER): Payer: Medicare Other | Admitting: Oncology

## 2018-11-06 ENCOUNTER — Telehealth: Payer: Self-pay | Admitting: Oncology

## 2018-11-06 ENCOUNTER — Other Ambulatory Visit: Payer: Self-pay

## 2018-11-06 VITALS — HR 78

## 2018-11-06 VITALS — BP 103/62 | HR 96 | Temp 98.5°F | Resp 18 | Ht 73.0 in | Wt 148.1 lb

## 2018-11-06 DIAGNOSIS — Z87891 Personal history of nicotine dependence: Secondary | ICD-10-CM

## 2018-11-06 DIAGNOSIS — Z48813 Encounter for surgical aftercare following surgery on the respiratory system: Secondary | ICD-10-CM | POA: Diagnosis not present

## 2018-11-06 DIAGNOSIS — C3491 Malignant neoplasm of unspecified part of right bronchus or lung: Secondary | ICD-10-CM | POA: Insufficient documentation

## 2018-11-06 DIAGNOSIS — C3411 Malignant neoplasm of upper lobe, right bronchus or lung: Secondary | ICD-10-CM | POA: Diagnosis not present

## 2018-11-06 DIAGNOSIS — R5383 Other fatigue: Secondary | ICD-10-CM

## 2018-11-06 DIAGNOSIS — J90 Pleural effusion, not elsewhere classified: Secondary | ICD-10-CM

## 2018-11-06 DIAGNOSIS — J910 Malignant pleural effusion: Secondary | ICD-10-CM | POA: Diagnosis not present

## 2018-11-06 DIAGNOSIS — J9621 Acute and chronic respiratory failure with hypoxia: Secondary | ICD-10-CM | POA: Diagnosis not present

## 2018-11-06 DIAGNOSIS — C3490 Malignant neoplasm of unspecified part of unspecified bronchus or lung: Secondary | ICD-10-CM

## 2018-11-06 DIAGNOSIS — Z5111 Encounter for antineoplastic chemotherapy: Secondary | ICD-10-CM | POA: Diagnosis not present

## 2018-11-06 DIAGNOSIS — Z5112 Encounter for antineoplastic immunotherapy: Secondary | ICD-10-CM | POA: Diagnosis not present

## 2018-11-06 DIAGNOSIS — J181 Lobar pneumonia, unspecified organism: Secondary | ICD-10-CM | POA: Diagnosis not present

## 2018-11-06 DIAGNOSIS — D638 Anemia in other chronic diseases classified elsewhere: Secondary | ICD-10-CM | POA: Diagnosis not present

## 2018-11-06 DIAGNOSIS — D649 Anemia, unspecified: Secondary | ICD-10-CM | POA: Diagnosis not present

## 2018-11-06 LAB — CMP (CANCER CENTER ONLY)
ALT: 17 U/L (ref 0–44)
AST: 20 U/L (ref 15–41)
Albumin: 2 g/dL — ABNORMAL LOW (ref 3.5–5.0)
Alkaline Phosphatase: 98 U/L (ref 38–126)
Anion gap: 12 (ref 5–15)
BUN: 10 mg/dL (ref 8–23)
CHLORIDE: 97 mmol/L — AB (ref 98–111)
CO2: 27 mmol/L (ref 22–32)
Calcium: 9 mg/dL (ref 8.9–10.3)
Creatinine: 0.71 mg/dL (ref 0.61–1.24)
GFR, Est AFR Am: >60 mL/min (ref >60)
GFR, Estimated: >60 mL/min (ref >60)
Glucose, Bld: 118 mg/dL — ABNORMAL HIGH (ref 70–99)
Potassium: 4.2 mmol/L (ref 3.5–5.1)
Sodium: 136 mmol/L (ref 135–145)
Total Bilirubin: 0.4 mg/dL (ref 0.3–1.2)
Total Protein: 7.9 g/dL (ref 6.5–8.1)

## 2018-11-06 LAB — CBC WITH DIFFERENTIAL (CANCER CENTER ONLY)
Abs Immature Granulocytes: 0.04 10*3/uL (ref 0.00–0.07)
Basophils Absolute: 0 10*3/uL (ref 0.0–0.1)
Basophils Relative: 0 %
Eosinophils Absolute: 0.1 10*3/uL (ref 0.0–0.5)
Eosinophils Relative: 2 %
HCT: 28.8 % — ABNORMAL LOW (ref 39.0–52.0)
Hemoglobin: 8.7 g/dL — ABNORMAL LOW (ref 13.0–17.0)
Immature Granulocytes: 1 %
Lymphocytes Relative: 9 %
Lymphs Abs: 0.5 10*3/uL — ABNORMAL LOW (ref 0.7–4.0)
MCH: 26.6 pg (ref 26.0–34.0)
MCHC: 30.2 g/dL (ref 30.0–36.0)
MCV: 88.1 fL (ref 80.0–100.0)
Monocytes Absolute: 1 10*3/uL (ref 0.1–1.0)
Monocytes Relative: 17 %
NEUTROS ABS: 4.3 10*3/uL (ref 1.7–7.7)
Neutrophils Relative %: 71 %
PLATELETS: 428 10*3/uL — AB (ref 150–400)
RBC: 3.27 MIL/uL — ABNORMAL LOW (ref 4.22–5.81)
RDW: 17.8 % — ABNORMAL HIGH (ref 11.5–15.5)
WBC Count: 6 10*3/uL (ref 4.0–10.5)
nRBC: 0 % (ref 0.0–0.2)

## 2018-11-06 MED ORDER — CYANOCOBALAMIN 1000 MCG/ML IJ SOLN
1000.0000 ug | Freq: Once | INTRAMUSCULAR | Status: AC
  Administered 2018-11-06: 1000 ug via INTRAMUSCULAR

## 2018-11-06 MED ORDER — DEXAMETHASONE SODIUM PHOSPHATE 10 MG/ML IJ SOLN
INTRAMUSCULAR | Status: AC
  Filled 2018-11-06: qty 1

## 2018-11-06 MED ORDER — PALONOSETRON HCL INJECTION 0.25 MG/5ML
INTRAVENOUS | Status: AC
  Filled 2018-11-06: qty 5

## 2018-11-06 MED ORDER — DEXAMETHASONE SODIUM PHOSPHATE 10 MG/ML IJ SOLN
10.0000 mg | Freq: Once | INTRAMUSCULAR | Status: AC
  Administered 2018-11-06: 10 mg via INTRAVENOUS

## 2018-11-06 MED ORDER — SODIUM CHLORIDE 0.9 % IV SOLN
Freq: Once | INTRAVENOUS | Status: AC
  Administered 2018-11-06: 09:00:00 via INTRAVENOUS
  Filled 2018-11-06: qty 250

## 2018-11-06 MED ORDER — CYANOCOBALAMIN 1000 MCG/ML IJ SOLN
INTRAMUSCULAR | Status: AC
  Filled 2018-11-06: qty 1

## 2018-11-06 MED ORDER — SODIUM CHLORIDE 0.9 % IV SOLN
377.2000 mg | Freq: Once | INTRAVENOUS | Status: AC
  Administered 2018-11-06: 380 mg via INTRAVENOUS
  Filled 2018-11-06: qty 38

## 2018-11-06 MED ORDER — PALONOSETRON HCL INJECTION 0.25 MG/5ML
0.2500 mg | Freq: Once | INTRAVENOUS | Status: AC
  Administered 2018-11-06: 0.25 mg via INTRAVENOUS

## 2018-11-06 MED ORDER — SODIUM CHLORIDE 0.9 % IV SOLN
200.0000 mg | Freq: Once | INTRAVENOUS | Status: AC
  Administered 2018-11-06: 200 mg via INTRAVENOUS
  Filled 2018-11-06: qty 8

## 2018-11-06 MED ORDER — SODIUM CHLORIDE 0.9 % IV SOLN
515.0000 mg/m2 | Freq: Once | INTRAVENOUS | Status: AC
  Administered 2018-11-06: 1000 mg via INTRAVENOUS
  Filled 2018-11-06: qty 40

## 2018-11-06 NOTE — Progress Notes (Signed)
[place] OFFICE PROGRESS NOTE   Diagnosis: Non-small cell lung cancer  INTERVAL HISTORY:   Brian Ramirez completed another cycle of Alimta/carboplatin/pembrolizumab on 10/15/2018.  No nausea/vomiting, mouth sores, rash, or diarrhea.  Leg edema has resolved.  He feels well.  He is ambulating in the home without difficulty.  He continues nasal cannula oxygen.  He has ordered an oxygen concentrator.  He has noted the Pleurx drainage is less bloody.  The Pleurx drained 250 cc and 100 cc the last 2 times. The catheter stopped draining last week.  He was seen in interventional radiology and TPA was infused into the drainage catheter.  Objective:  Vital signs in last 24 hours:  Blood pressure 103/62, pulse 96, temperature 98.5 F (36.9 C), temperature source Oral, resp. rate 18, height '6\' 1"'  (1.854 m), weight 148 lb 1.6 oz (67.2 kg), SpO2 100 %.    HEENT: No thrush or ulcers Resp: Decreased breath sounds throughout the right chest, no respiratory distress Cardio: Regular rate and rhythm GI: Nontender, no hepatomegaly Vascular: No leg edema     Lab Results:  Lab Results  Component Value Date   WBC 6.0 11/06/2018   HGB 8.7 (L) 11/06/2018   HCT 28.8 (L) 11/06/2018   MCV 88.1 11/06/2018   PLT 428 (H) 11/06/2018   NEUTROABS 4.3 11/06/2018    CMP  Lab Results  Component Value Date   NA 138 10/25/2018   K 4.2 10/25/2018   CL 96 (L) 10/25/2018   CO2 32 10/25/2018   GLUCOSE 142 (H) 10/25/2018   BUN 13 10/25/2018   CREATININE 0.68 10/25/2018   CALCIUM 8.9 10/25/2018   PROT 7.0 10/15/2018   ALBUMIN 1.7 (L) 10/15/2018   AST 40 10/15/2018   ALT 34 10/15/2018   ALKPHOS 150 (H) 10/15/2018   BILITOT 0.6 10/15/2018   GFRNONAA >60 10/25/2018   GFRAA >60 10/25/2018    Imaging:  No results found.  Medications: I have reviewed the patient's current medications.   Assessment/Plan: 1. Metastatic non-small cell lung cancer  largeright pleural effusion, right  lung mass, right pleural-based masses, left lung nodules  Right thoracentesis 09/04/2018-negative cytology  CT biopsy of right lateral pleural mass 09/07/2018-poorly differentiated non-small cell carcinoma, malignant cells positive for cytokeratin AE1/AE3 and cytokeratin 8/18, histology favoring adenocarcinoma, MSS, tumor mutation burden 5.no EGFR, ALK, BRAF alteration.  PDL 1 rearrangement  PDL 1 tumor proportion score-100%   Cycle 1 Alimta/carboplatin 09/22/2018, pembrolizumab 09/28/2018  Cycle 2 Alimta/carboplatin/pembrolizumab 10/15/2018  Cycle 3 Alimta/carboplatin/pembrolizumab 11/06/2018  2.Severe anemia-likely secondary to bleeding in the right pleural space and metastatic carcinoma, red cell transfusions 09/09/2018 09/10/2018, 09/23/2018 3.Dyspnea secondary to the large right pleural effusion and anemia  Right Pleurx catheter placed 09/11/2018  Chest x-ray 10/09/2018- large right pleural effusion, loculated 4.History of tobacco use 5.History of kidney stones 6. Fever-most likely tumor fever, improved 7. Admission 10/08/2018 with rapid atrial fibrillation/flutter- improved with diltiazem, started on Eliquis anticoagulation    Disposition: [name] appears well.  His performance status has improved significantly over the past month.  The plan is to continue treatment with Alimta/carboplatin/pembrolizumab.  He will complete cycle 3 today.  He continues to have a right pleural effusion with minimal drainage from the Pleurx.  We will obtain a chest x-ray today.  The plan is to remove the Pleurx if the drainage is consistently at less than 100 cc daily.  He will wean oxygen as tolerated.  We will check a TSH when he returns in 3  weeks.  He will be scheduled for an office visit in the next cycle of chemotherapy on 11/27/2018.  He will be scheduled for a restaging CT after cycle 4.  Betsy Coder, MD  11/06/2018  8:21 AM

## 2018-11-06 NOTE — Telephone Encounter (Signed)
Gave avs and calendar ° °

## 2018-11-06 NOTE — Patient Instructions (Signed)
[place] Discharge Instructions for Patients Receiving Chemotherapy  Today you received the following chemotherapy agents Keytruda, Alimta, Carboplatin  To help prevent nausea and vomiting after your treatment, we encourage you to take your nausea medication as directed.   If you develop nausea and vomiting that is not controlled by your nausea medication, call the clinic.   BELOW ARE SYMPTOMS THAT SHOULD BE REPORTED IMMEDIATELY:  *FEVER GREATER THAN 100.5 F  *CHILLS WITH OR WITHOUT FEVER  NAUSEA AND VOMITING THAT IS NOT CONTROLLED WITH YOUR NAUSEA MEDICATION  *UNUSUAL SHORTNESS OF BREATH  *UNUSUAL BRUISING OR BLEEDING  TENDERNESS IN MOUTH AND THROAT WITH OR WITHOUT PRESENCE OF ULCERS  *URINARY PROBLEMS  *BOWEL PROBLEMS  UNUSUAL RASH Items with * indicate a potential emergency and should be followed up as soon as possible.  Feel free to call the clinic should you have any questions or concerns. The clinic phone number is [phone].  Please show the [place] at check-in to the Emergency Department and triage nurse.

## 2018-11-07 ENCOUNTER — Ambulatory Visit: Payer: Self-pay

## 2018-11-07 DIAGNOSIS — J9621 Acute and chronic respiratory failure with hypoxia: Secondary | ICD-10-CM | POA: Diagnosis not present

## 2018-11-07 DIAGNOSIS — J181 Lobar pneumonia, unspecified organism: Secondary | ICD-10-CM | POA: Diagnosis not present

## 2018-11-07 DIAGNOSIS — C3411 Malignant neoplasm of upper lobe, right bronchus or lung: Secondary | ICD-10-CM | POA: Diagnosis not present

## 2018-11-07 DIAGNOSIS — J910 Malignant pleural effusion: Secondary | ICD-10-CM | POA: Diagnosis not present

## 2018-11-07 DIAGNOSIS — Z48813 Encounter for surgical aftercare following surgery on the respiratory system: Secondary | ICD-10-CM | POA: Diagnosis not present

## 2018-11-07 DIAGNOSIS — D638 Anemia in other chronic diseases classified elsewhere: Secondary | ICD-10-CM | POA: Diagnosis not present

## 2018-11-08 ENCOUNTER — Telehealth: Payer: Self-pay | Admitting: *Deleted

## 2018-11-08 DIAGNOSIS — D638 Anemia in other chronic diseases classified elsewhere: Secondary | ICD-10-CM | POA: Diagnosis not present

## 2018-11-08 DIAGNOSIS — C3490 Malignant neoplasm of unspecified part of unspecified bronchus or lung: Secondary | ICD-10-CM

## 2018-11-08 DIAGNOSIS — J910 Malignant pleural effusion: Secondary | ICD-10-CM | POA: Diagnosis not present

## 2018-11-08 DIAGNOSIS — J9621 Acute and chronic respiratory failure with hypoxia: Secondary | ICD-10-CM | POA: Diagnosis not present

## 2018-11-08 DIAGNOSIS — Z48813 Encounter for surgical aftercare following surgery on the respiratory system: Secondary | ICD-10-CM | POA: Diagnosis not present

## 2018-11-08 DIAGNOSIS — J181 Lobar pneumonia, unspecified organism: Secondary | ICD-10-CM | POA: Diagnosis not present

## 2018-11-08 DIAGNOSIS — C3411 Malignant neoplasm of upper lobe, right bronchus or lung: Secondary | ICD-10-CM | POA: Diagnosis not present

## 2018-11-08 NOTE — Telephone Encounter (Signed)
Notified patient that CXR was stable and orders have been placed to have the pleurx drain removed. Radiology will be calling him.

## 2018-11-08 NOTE — Telephone Encounter (Addendum)
Nurse called to report he only has #5 pleurx drain kits on hand and more need to be ordered, but she does not know where they should be ordered from. Reports they usually order supplies through [facility].  Draining every other day with output of 75-100 cc of serosanguineous drainage. Does MD wish to continue every other day draining? Left VM with [facility] [phone]) requesting direction on how to order the drain kits. After review w/Dr. [name], he reports the drain can now be removed. Order placed in Epic to remove drain by 11/13/18 if possible.

## 2018-11-10 DIAGNOSIS — J9621 Acute and chronic respiratory failure with hypoxia: Secondary | ICD-10-CM | POA: Diagnosis not present

## 2018-11-10 DIAGNOSIS — C3411 Malignant neoplasm of upper lobe, right bronchus or lung: Secondary | ICD-10-CM | POA: Diagnosis not present

## 2018-11-10 DIAGNOSIS — J910 Malignant pleural effusion: Secondary | ICD-10-CM | POA: Diagnosis not present

## 2018-11-10 DIAGNOSIS — D638 Anemia in other chronic diseases classified elsewhere: Secondary | ICD-10-CM | POA: Diagnosis not present

## 2018-11-10 DIAGNOSIS — Z48813 Encounter for surgical aftercare following surgery on the respiratory system: Secondary | ICD-10-CM | POA: Diagnosis not present

## 2018-11-10 DIAGNOSIS — J181 Lobar pneumonia, unspecified organism: Secondary | ICD-10-CM | POA: Diagnosis not present

## 2018-11-12 DIAGNOSIS — C3411 Malignant neoplasm of upper lobe, right bronchus or lung: Secondary | ICD-10-CM | POA: Diagnosis not present

## 2018-11-12 DIAGNOSIS — D638 Anemia in other chronic diseases classified elsewhere: Secondary | ICD-10-CM | POA: Diagnosis not present

## 2018-11-12 DIAGNOSIS — Z48813 Encounter for surgical aftercare following surgery on the respiratory system: Secondary | ICD-10-CM | POA: Diagnosis not present

## 2018-11-12 DIAGNOSIS — J910 Malignant pleural effusion: Secondary | ICD-10-CM | POA: Diagnosis not present

## 2018-11-12 DIAGNOSIS — J9621 Acute and chronic respiratory failure with hypoxia: Secondary | ICD-10-CM | POA: Diagnosis not present

## 2018-11-12 DIAGNOSIS — J181 Lobar pneumonia, unspecified organism: Secondary | ICD-10-CM | POA: Diagnosis not present

## 2018-11-13 ENCOUNTER — Ambulatory Visit (HOSPITAL_COMMUNITY)
Admission: RE | Admit: 2018-11-13 | Discharge: 2018-11-13 | Disposition: A | Discharge status: Home / Self Care | Payer: Medicare Other | Source: Ambulatory Visit | Attending: Oncology | Admitting: Oncology

## 2018-11-13 ENCOUNTER — Encounter (HOSPITAL_COMMUNITY): Payer: Self-pay

## 2018-11-13 ENCOUNTER — Other Ambulatory Visit: Payer: Self-pay | Admitting: Physician Assistant

## 2018-11-13 ENCOUNTER — Other Ambulatory Visit: Payer: Self-pay

## 2018-11-13 DIAGNOSIS — J90 Pleural effusion, not elsewhere classified: Secondary | ICD-10-CM | POA: Diagnosis not present

## 2018-11-13 DIAGNOSIS — I4892 Unspecified atrial flutter: Secondary | ICD-10-CM | POA: Diagnosis not present

## 2018-11-13 DIAGNOSIS — Z9981 Dependence on supplemental oxygen: Secondary | ICD-10-CM | POA: Diagnosis not present

## 2018-11-13 DIAGNOSIS — Z87891 Personal history of nicotine dependence: Secondary | ICD-10-CM | POA: Diagnosis not present

## 2018-11-13 DIAGNOSIS — C3490 Malignant neoplasm of unspecified part of unspecified bronchus or lung: Secondary | ICD-10-CM | POA: Diagnosis not present

## 2018-11-13 DIAGNOSIS — Z7901 Long term (current) use of anticoagulants: Secondary | ICD-10-CM | POA: Diagnosis not present

## 2018-11-13 DIAGNOSIS — E440 Moderate protein-calorie malnutrition: Secondary | ICD-10-CM | POA: Diagnosis not present

## 2018-11-13 DIAGNOSIS — M47815 Spondylosis without myelopathy or radiculopathy, thoracolumbar region: Secondary | ICD-10-CM | POA: Diagnosis not present

## 2018-11-13 DIAGNOSIS — I4891 Unspecified atrial fibrillation: Secondary | ICD-10-CM | POA: Diagnosis not present

## 2018-11-13 DIAGNOSIS — Z87442 Personal history of urinary calculi: Secondary | ICD-10-CM | POA: Diagnosis not present

## 2018-11-13 DIAGNOSIS — D638 Anemia in other chronic diseases classified elsewhere: Secondary | ICD-10-CM | POA: Diagnosis not present

## 2018-11-13 DIAGNOSIS — R627 Adult failure to thrive: Secondary | ICD-10-CM | POA: Diagnosis not present

## 2018-11-13 DIAGNOSIS — J9621 Acute and chronic respiratory failure with hypoxia: Secondary | ICD-10-CM | POA: Diagnosis not present

## 2018-11-13 DIAGNOSIS — Z9181 History of falling: Secondary | ICD-10-CM | POA: Diagnosis not present

## 2018-11-13 DIAGNOSIS — Z452 Encounter for adjustment and management of vascular access device: Secondary | ICD-10-CM | POA: Diagnosis not present

## 2018-11-13 DIAGNOSIS — Z602 Problems related to living alone: Secondary | ICD-10-CM | POA: Diagnosis not present

## 2018-11-13 DIAGNOSIS — D500 Iron deficiency anemia secondary to blood loss (chronic): Secondary | ICD-10-CM | POA: Diagnosis not present

## 2018-11-13 DIAGNOSIS — I509 Heart failure, unspecified: Secondary | ICD-10-CM | POA: Insufficient documentation

## 2018-11-13 DIAGNOSIS — J910 Malignant pleural effusion: Secondary | ICD-10-CM | POA: Insufficient documentation

## 2018-11-13 DIAGNOSIS — Z79899 Other long term (current) drug therapy: Secondary | ICD-10-CM | POA: Diagnosis not present

## 2018-11-13 DIAGNOSIS — I700 Atherosclerosis of aorta: Secondary | ICD-10-CM | POA: Diagnosis not present

## 2018-11-13 DIAGNOSIS — Z8701 Personal history of pneumonia (recurrent): Secondary | ICD-10-CM | POA: Diagnosis not present

## 2018-11-13 DIAGNOSIS — I5031 Acute diastolic (congestive) heart failure: Secondary | ICD-10-CM | POA: Diagnosis not present

## 2018-11-13 DIAGNOSIS — C3411 Malignant neoplasm of upper lobe, right bronchus or lung: Secondary | ICD-10-CM | POA: Diagnosis not present

## 2018-11-13 DIAGNOSIS — D649 Anemia, unspecified: Secondary | ICD-10-CM | POA: Diagnosis not present

## 2018-11-13 DIAGNOSIS — Z48813 Encounter for surgical aftercare following surgery on the respiratory system: Secondary | ICD-10-CM | POA: Diagnosis not present

## 2018-11-13 HISTORY — PX: IR REMOVAL OF PLURAL CATH W/CUFF: IMG5346

## 2018-11-13 LAB — CBC
HCT: 29.4 % — ABNORMAL LOW (ref 39.0–52.0)
Hemoglobin: 8.4 g/dL — ABNORMAL LOW (ref 13.0–17.0)
MCH: 25.9 pg — ABNORMAL LOW (ref 26.0–34.0)
MCHC: 28.6 g/dL — ABNORMAL LOW (ref 30.0–36.0)
MCV: 90.7 fL (ref 80.0–100.0)
PLATELETS: 256 10*3/uL (ref 150–400)
RBC: 3.24 MIL/uL — ABNORMAL LOW (ref 4.22–5.81)
RDW: 18 % — ABNORMAL HIGH (ref 11.5–15.5)
WBC: 2.6 10*3/uL — ABNORMAL LOW (ref 4.0–10.5)
nRBC: 0 % (ref 0.0–0.2)

## 2018-11-13 LAB — PROTIME-INR
INR: 1 (ref 0.8–1.2)
Prothrombin Time: 13 seconds (ref 11.4–15.2)

## 2018-11-13 MED ORDER — FENTANYL CITRATE (PF) 100 MCG/2ML IJ SOLN
INTRAMUSCULAR | Status: AC
  Filled 2018-11-13: qty 2

## 2018-11-13 MED ORDER — MIDAZOLAM HCL 2 MG/2ML IJ SOLN
INTRAMUSCULAR | Status: AC | PRN
  Administered 2018-11-13 (×2): 1 mg via INTRAVENOUS

## 2018-11-13 MED ORDER — LIDOCAINE HCL 1 % IJ SOLN
INTRAMUSCULAR | Status: AC
  Filled 2018-11-13: qty 20

## 2018-11-13 MED ORDER — FENTANYL CITRATE (PF) 100 MCG/2ML IJ SOLN
INTRAMUSCULAR | Status: AC | PRN
  Administered 2018-11-13 (×2): 50 ug via INTRAVENOUS

## 2018-11-13 MED ORDER — SODIUM CHLORIDE 0.9 % IV SOLN
INTRAVENOUS | Status: DC
  Administered 2018-11-13: 14:00:00 via INTRAVENOUS

## 2018-11-13 MED ORDER — CEFAZOLIN SODIUM-DEXTROSE 2-4 GM/100ML-% IV SOLN
INTRAVENOUS | Status: AC
  Administered 2018-11-13: 2000 mg
  Filled 2018-11-13: qty 100

## 2018-11-13 MED ORDER — LIDOCAINE HCL (PF) 1 % IJ SOLN
INTRAMUSCULAR | Status: AC | PRN
  Administered 2018-11-13: 5 mL

## 2018-11-13 MED ORDER — MIDAZOLAM HCL 2 MG/2ML IJ SOLN
INTRAMUSCULAR | Status: AC
  Filled 2018-11-13: qty 2

## 2018-11-13 NOTE — Procedures (Signed)
Interventional Radiology Procedure Note  Procedure: Removal of tunneled right PleurX catheter  Complications: None  Estimated Blood Loss: < 10 mL  Findings: PleurX removed from right pleural space using dissection to free subcu cuff. During retraction, 90 mL of dark brown fluid aspirated/removed. Entire catheter removed.  Venetia Night. Kathlene Cote, M.D Pager:  [phone]

## 2018-11-13 NOTE — H&P (Signed)
Chief Complaint: Patient was seen in consultation today for right tunneled pleural catheter removal  Referring Physician(s): Sherrill,Gary B  Supervising Physician: Aletta Edouard  Patient Status: [facility] - Out-pt  History of Present Illness: Brian Ramirez is a 73 y.o. male with a past medical history significant for anemia, CHF, a.fib on Eliquis and non-small cell lung cancer with recurrent right pleural effusion s/p right Pleurx placement in IR on 09/11/18 by Dr. Annamaria Boots. Patient was previously seen in IR on 2/26 and 3/18 for tPA dwell due to low output. He has been followed by Dr. [name] and at his most recent visit it was noted that the output had been decreasing with each use until it eventually stopped draining about 2 weeks ago. Request has been made for Pleurx removal today in IR due to decreasing drainage and improvement in performance status.  Patient reports he is doing well overall, however he is concerned about being in public due to the coronavirus pandemic and has been trying to limit his doctor's visits as much as possible. He is undergoing chemotherapy currently and has been doing well. He reports that the Pleurx has not put out anything in about 2 weeks or so, he does not have any pain or drainage at the insertion site. His breathing has been at baseline. He continues to wear supplemental oxygen at home, usually 2 L. He states that the placement of the Pleurx was very easy and he is hopeful that removal will be the same.   Past Medical History:  Diagnosis Date   Acute congestive heart failure ([place])    Anemia of chronic disease 10/08/2018   Anticoagulated    Atrial flutter with rapid ventricular response ([place]) 10/08/2018   Difficult airway for intubation 09/26/2016   Edema of both lower extremities 10/09/2018   Goals of care, counseling/discussion 09/20/2018   Hernia 2012   History of hiatal hernia    History of kidney stones    Left inguinal hernia s/p lap  repair 11/22/2016 09/26/2016   Lung cancer ([place]) 09/20/2018   Lung mass 09/04/2018   Malnutrition of moderate degree 09/20/2018   Mass of right lung 09/04/2018   New onset atrial fibrillation ([facility])    Personal history of kidney stones 1992   Pleural effusion 09/04/2018   Pleural effusion on right    Pressure injury of skin 09/20/2018   SOB (shortness of breath) 09/19/2018    Past Surgical History:  Procedure Laterality Date   HERNIA REPAIR  2012   inguinal   INGUINAL HERNIA REPAIR Left 11/22/2016   Procedure: LAPAROSCOPIC  REPAIR OF LEFT INGUINAL HERNIA WITH MESH;  Surgeon: [name], MD;  Location: [facility] ORS;  Service: General;  Laterality: Left;   IR INSTILL VIA CHEST TUBE AGENT FOR FIBRINOLYSIS INI DAY  10/10/2018   IR INSTILL VIA CHEST TUBE AGENT FOR FIBRINOLYSIS INI DAY  10/31/2018   IR PERC PLEURAL DRAIN W/INDWELL CATH W/IMG GUIDE  09/11/2018    Allergies: Patient has no known allergies.  Medications: Prior to Admission medications   Medication Sig Start Date End Date Taking? Authorizing Provider  apixaban (ELIQUIS) 5 MG TABS tablet Take 1 tablet (5 mg total) by mouth 2 (two) times daily. 10/11/18   Rai, Vernelle Emerald, MD  diltiazem (CARDIZEM CD) 240 MG 24 hr capsule Take 1 capsule (240 mg total) by mouth daily. 10/12/18   Rai, Vernelle Emerald, MD  docusate sodium (COLACE) 100 MG capsule Take 100 mg by mouth 2 (two) times daily.  [provider]  folic acid (FOLVITE) 1 MG tablet Take 1 mg by mouth daily.    [provider]  furosemide (LASIX) 40 MG tablet Take 1 tablet (40 mg total) by mouth daily. 10/15/18   Ladell Pier, MD  guaiFENesin (MUCINEX) 600 MG 12 hr tablet Take 1 tablet (600 mg total) by mouth 2 (two) times daily. 10/11/18   Rai, Vernelle Emerald, MD  hydrOXYzine (ATARAX/VISTARIL) 10 MG tablet Take 10 mg by mouth every 8 (eight) hours as needed. 1 tablet by mouth every 8 hours as needed for itching    [provider]  Nutritional Supplements  (NUTRITIONAL DRINK PO) Take 1 application by mouth 3 (three) times daily.    [provider]  polyethylene glycol (MIRALAX / GLYCOLAX) packet Take 17 g by mouth daily as needed for moderate constipation or severe constipation (use first). 09/25/18   Amin, Jeanella Flattery, MD  potassium chloride SA (K-DUR,KLOR-CON) 20 MEQ tablet Take 1 tablet (20 mEq total) by mouth daily. 10/11/18   Rai, Vernelle Emerald, MD  traMADol (ULTRAM) 50 MG tablet Take 1 tablet (50 mg total) by mouth every 6 (six) hours as needed for moderate pain or severe pain. 10/11/18   Mendel Corning, MD     Family History  Problem Relation Age of Onset   Heart disease Father    Cancer Brother     Social History   Socioeconomic History   Marital status: Single    Spouse name: Not on file   Number of children: Not on file   Years of education: Not on file   Highest education level: Not on file  Occupational History   Not on file  Social Needs   Financial resource strain: Not on file   Food insecurity:    Worry: Not on file    Inability: Not on file   Transportation needs:    Medical: Not on file    Non-medical: Not on file  Tobacco Use   Smoking status: Never Smoker   Smokeless tobacco: Never Used  Substance and Sexual Activity   Alcohol use: Yes    Alcohol/week: 1.0 standard drinks    Types: 1 Glasses of wine per week   Drug use: No   Sexual activity: Not on file  Lifestyle   Physical activity:    Days per week: Not on file    Minutes per session: Not on file   Stress: Not on file  Relationships   Social connections:    Talks on phone: Not on file    Gets together: Not on file    Attends religious service: Not on file    Active member of club or organization: Not on file    Attends meetings of clubs or organizations: Not on file    Relationship status: Not on file  Other Topics Concern   Not on file  Social History Narrative   Not on file     Review of Systems: A 12 point ROS  discussed and pertinent positives are indicated in the HPI above.  All other systems are negative.  Review of Systems  Constitutional: Positive for fatigue. Negative for chills and fever.  Respiratory: Positive for shortness of breath (with exertion - wears O2 at home). Negative for cough.   Cardiovascular: Negative for chest pain.  Gastrointestinal: Negative for abdominal pain, nausea and vomiting.  Musculoskeletal: Negative for back pain.  Neurological: Negative for headaches.    Vital Signs: There were no vitals taken  for this visit.  Physical Exam Vitals signs reviewed.  Constitutional:      General: He is not in acute distress.    Appearance: He is ill-appearing.  HENT:     Head: Normocephalic.  Cardiovascular:     Rate and Rhythm: Normal rate and regular rhythm.  Pulmonary:     Effort: Pulmonary effort is normal.     Comments: Diminished breath sounds on the right Abdominal:     General: There is no distension.     Palpations: Abdomen is soft.     Tenderness: There is no abdominal tenderness.  Skin:    General: Skin is warm and dry.  Neurological:     Mental Status: He is alert and oriented to person, place, and time.  Psychiatric:        Mood and Affect: Mood normal.        Behavior: Behavior normal.        Thought Content: Thought content normal.        Judgment: Judgment normal.      MD Evaluation Airway: WNL Heart: WNL Abdomen: WNL Chest/ Lungs: WNL ASA  Classification: 3 Mallampati/Airway Score: One   Imaging: Dg Chest 2 View  Result Date: 11/06/2018 CLINICAL DATA:  Lung cancer.  Follow-up right effusion. EXAM: CHEST - 2 VIEW COMPARISON:  October 25, 2018 FINDINGS: Again noted is a moderate right-sided pleural effusion with underlying opacity, similar in the interval. A right chest tube remains in stable position. Hyperinflation of the left lung consistent with COPD or emphysema. Interstitial prominence on the right is stable. No new nodules, masses, or  focal infiltrate. Stable cardiomediastinal silhouette. IMPRESSION: No interval change in the right-sided pleural effusion or right chest tube. Continued interstitial prominence on the right, stable. Findings of COPD/emphysema. Electronically Signed   By: Dorise Bullion III M.D   On: 11/06/2018 13:10   Dg Chest 2 View  Result Date: 10/25/2018 CLINICAL DATA:  Follow-up RIGHT malignant pleural effusion. Increasing shortness of breath. EXAM: CHEST - 2 VIEW COMPARISON:  Chest radiograph October 09, 2018 FINDINGS: RIGHT lung base chest tube. Moderate to large residual RIGHT pleural effusion. Interstitial prominence. Increased lung volumes. LEFT lung is otherwise clear. Cardiac silhouette is partially obscured. Calcified aortic arch. No pneumothorax. Soft tissue planes included osseous structures are nonacute. IMPRESSION: 1. Moderate to large RIGHT pleural effusion with RIGHT lung base chest tube. 2. Diffuse interstitial prominence, in part reflecting COPD. Electronically Signed   By: Elon Alas M.D.   On: 10/25/2018 21:47   Ir Instill Via Ch/tube Fibrinolysis Ini  Result Date: 10/31/2018 INDICATION: Patient with history of metastatic non-small cell lung cancer with recurrent malignant right pleural effusion, status post right PleurX catheter placement on 09/11/2018. He has had minimal output from drain recently despite significant loculated effusion on imaging and request now received for tPA dwell into chest drain to expedite output. EXAM: INSTALLATION OF TPA INTO RIGHT CHEST DRAIN/PLEURX MEDICATIONS: 8 mg of tPA in 60 cc normal saline ANESTHESIA/SEDATION: None COMPLICATIONS: None immediate. PROCEDURE: Informed written consent was obtained from the patient after a thorough discussion of the procedural risks, benefits and alternatives. All questions were addressed. Maximal Sterile Barrier Technique was utilized including caps, mask, sterile gowns, sterile gloves, sterile drape, hand hygiene and skin  antiseptic. A timeout was performed prior to the initiation of the procedure. Approximately 8 mg of tPA in 60 cc normal saline was instilled into right PleurX catheter and clamped for 2 hours. Approximately 600 cc of bloody fluid  was subsequently drained from right pleural space via PleurX canister. IMPRESSION: Successful installation of tPA into right PleurX catheter with removal of 600 cc of bloody fluid following dwell time of 2 hours. No immediate complications. Read by: Rowe Robert, PA-C PLAN: The patient has now undergone 2 procedures with tPA installation to get the PleurX catheter nonfunctional. If the catheter is nonfunctional on the next attempt, would have the patient arrive on a non urgent basis for catheter removal. If the catheter is not functioning outside of the hospital without the need for tPA infusion, then the presence of the catheter is counterproductive. Electronically Signed   By: Corrie Mckusick D.O.   On: 10/31/2018 13:37    Labs:  CBC: Recent Labs    10/15/18 0912 10/25/18 1034 11/06/18 0728 11/13/18 1309  WBC 8.3 4.4 6.0 2.6*  HGB 9.4* 8.3* 8.7* 8.4*  HCT 31.6* 28.6* 28.8* 29.4*  PLT 427* 269 428* 256    COAGS: Recent Labs    09/07/18 0540 10/09/18 1937 10/10/18 0806 10/10/18 1731 10/11/18 0544 11/13/18 1309  INR 1.03  --   --   --   --  1.0  APTT  --  42* 33 39* 45*  --     BMP: Recent Labs    10/11/18 0544 10/15/18 0912 10/25/18 1034 11/06/18 0728  NA 134* 136 138 136  K 3.3* 3.9 4.2 4.2  CL 89* 95* 96* 97*  CO2 37* 32 32 27  GLUCOSE 117* 143* 142* 118*  BUN 10 9 13 10   CALCIUM 8.1* 8.5* 8.9 9.0  CREATININE 0.58* 0.69 0.68 0.71  GFRNONAA >60 >60 >60 >60  GFRAA >60 >60 >60 >60    LIVER FUNCTION TESTS: Recent Labs    09/28/18 1410 10/09/18 0241 10/15/18 0912 11/06/18 0728  BILITOT 0.8 1.0 0.6 0.4  AST 49* 24 40 20  ALT 61* 21 34 17  ALKPHOS 128* 88 150* 98  PROT 5.7* 5.5* 7.0 7.9  ALBUMIN 1.4* 1.5* 1.7* 2.0*    TUMOR  MARKERS: No results for input(s): AFPTM, CEA, CA199, CHROMGRNA in the last 8760 hours.  Assessment and Plan:  73 y/o M with metastatic non-small cell lung cancer with recurrent right pleural effusion s/p right Pleurx placement 09/11/18 in IR by Dr. Annamaria Boots who presents today for Pleurx removal with moderate sedation.  Patient has been NPO since midnight, last dose of Eliquis 2/28, he took his heart medications this morning with a small sip of water. Afebrile, WBC 2.6, hgb 8.4, plt 256, INR 1.0.  Risks and benefits discussed with the patient including bleeding, infection and damage to adjacent structures.  All of the patient's questions were answered, patient is agreeable to proceed.  Consent signed and in chart.  Thank you for this interesting consult.  I greatly enjoyed meeting Brian Ramirez and look forward to participating in their care.  A copy of this report was sent to the requesting provider on this date.  Electronically Signed: Joaquim Nam, PA-C 11/13/2018, 1:35 PM   I spent a total of  15 Minutes in face to face in clinical consultation, greater than 50% of which was counseling/coordinating care for right pleurx removal.

## 2018-11-13 NOTE — Discharge Instructions (Signed)
Leave your dressing on until your home health nurse on Thursday. May shower tomorrow over dressing as it is water resistant. Look for any signs of infection: increased redness, drainage, or elevated temperature.   Moderate Conscious Sedation, Adult, Care After These instructions provide you with information about caring for yourself after your procedure. Your health care provider may also give you more specific instructions. Your treatment has been planned according to current medical practices, but problems sometimes occur. Call your health care provider if you have any problems or questions after your procedure. What can I expect after the procedure? After your procedure, it is common:  To feel sleepy for several hours.  To feel clumsy and have poor balance for several hours.  To have poor judgment for several hours.  To vomit if you eat too soon. Follow these instructions at home: For at least 24 hours after the procedure:   Do not: ? Participate in activities where you could fall or become injured. ? Drive. ? Use heavy machinery. ? Drink alcohol. ? Take sleeping pills or medicines that cause drowsiness. ? Make important decisions or sign legal documents. ? Take care of children on your own.  Rest. Eating and drinking  Follow the diet recommended by your health care provider.  If you vomit: ? Drink water, juice, or soup when you can drink without vomiting. ? Make sure you have little or no nausea before eating solid foods. General instructions  Have a responsible adult stay with you until you are awake and alert.  Take over-the-counter and prescription medicines only as told by your health care provider.  If you smoke, do not smoke without supervision.  Keep all follow-up visits as told by your health care provider. This is important. Contact a health care provider if:  You keep feeling nauseous or you keep vomiting.  You feel light-headed.  You develop a  rash.  You have a fever. Get help right away if:  You have trouble breathing. This information is not intended to replace advice given to you by your health care provider. Make sure you discuss any questions you have with your health care provider. Document Released: 05/22/2013 Document Revised: 01/04/2016 Document Reviewed: 11/21/2015 Elsevier Interactive Patient Education  2019 [employer].

## 2018-11-15 ENCOUNTER — Telehealth: Payer: Self-pay | Admitting: *Deleted

## 2018-11-15 DIAGNOSIS — C3411 Malignant neoplasm of upper lobe, right bronchus or lung: Secondary | ICD-10-CM | POA: Diagnosis not present

## 2018-11-15 DIAGNOSIS — J9621 Acute and chronic respiratory failure with hypoxia: Secondary | ICD-10-CM | POA: Diagnosis not present

## 2018-11-15 DIAGNOSIS — Z48813 Encounter for surgical aftercare following surgery on the respiratory system: Secondary | ICD-10-CM | POA: Diagnosis not present

## 2018-11-15 DIAGNOSIS — I4891 Unspecified atrial fibrillation: Secondary | ICD-10-CM | POA: Diagnosis not present

## 2018-11-15 DIAGNOSIS — J910 Malignant pleural effusion: Secondary | ICD-10-CM | POA: Diagnosis not present

## 2018-11-15 DIAGNOSIS — I5031 Acute diastolic (congestive) heart failure: Secondary | ICD-10-CM | POA: Diagnosis not present

## 2018-11-15 NOTE — Telephone Encounter (Signed)
Patient is having intermittent nosebleeds and he thinks it is due to allergies and dry sinuses. Asking for humidity be added to his oxygen system. [place] and spoke with Jeneen Rinks: Faxed order as requested to add humidity to his oxygen condenser. They will provided this equipment as requested.

## 2018-11-19 DIAGNOSIS — Z48813 Encounter for surgical aftercare following surgery on the respiratory system: Secondary | ICD-10-CM | POA: Diagnosis not present

## 2018-11-19 DIAGNOSIS — I4891 Unspecified atrial fibrillation: Secondary | ICD-10-CM | POA: Diagnosis not present

## 2018-11-19 DIAGNOSIS — I5031 Acute diastolic (congestive) heart failure: Secondary | ICD-10-CM | POA: Diagnosis not present

## 2018-11-19 DIAGNOSIS — C3411 Malignant neoplasm of upper lobe, right bronchus or lung: Secondary | ICD-10-CM | POA: Diagnosis not present

## 2018-11-19 DIAGNOSIS — J910 Malignant pleural effusion: Secondary | ICD-10-CM | POA: Diagnosis not present

## 2018-11-19 DIAGNOSIS — J9621 Acute and chronic respiratory failure with hypoxia: Secondary | ICD-10-CM | POA: Diagnosis not present

## 2018-11-22 ENCOUNTER — Telehealth: Payer: Self-pay | Admitting: *Deleted

## 2018-11-22 DIAGNOSIS — I5031 Acute diastolic (congestive) heart failure: Secondary | ICD-10-CM | POA: Diagnosis not present

## 2018-11-22 DIAGNOSIS — J9621 Acute and chronic respiratory failure with hypoxia: Secondary | ICD-10-CM | POA: Diagnosis not present

## 2018-11-22 DIAGNOSIS — J910 Malignant pleural effusion: Secondary | ICD-10-CM | POA: Diagnosis not present

## 2018-11-22 DIAGNOSIS — Z48813 Encounter for surgical aftercare following surgery on the respiratory system: Secondary | ICD-10-CM | POA: Diagnosis not present

## 2018-11-22 DIAGNOSIS — I4891 Unspecified atrial fibrillation: Secondary | ICD-10-CM | POA: Diagnosis not present

## 2018-11-22 DIAGNOSIS — C3411 Malignant neoplasm of upper lobe, right bronchus or lung: Secondary | ICD-10-CM | POA: Diagnosis not present

## 2018-11-22 NOTE — Telephone Encounter (Signed)
Called patient to discuss virtual visit that he has scheduled with Dr Meda Coffee for 12/03/2018. After our discussion he has decided that he is not sure that he would like to have this type of visit. He would prefer to be seen in the office for a face to face visit and an EKG. I made him aware that the soonest that we are scheduling live patients for is August unless he is having complications and feels the need to be seen sooner in which case he would need to speak with the nurse by phone to be assessed first. He stated that he has a nurse that will be coming today to do a home visit with him and he is going to discuss the idea of virtual visits with her and get her input. He will call the office back with his decision.

## 2018-11-26 DIAGNOSIS — I480 Paroxysmal atrial fibrillation: Secondary | ICD-10-CM | POA: Diagnosis not present

## 2018-11-26 DIAGNOSIS — C3400 Malignant neoplasm of unspecified main bronchus: Secondary | ICD-10-CM | POA: Diagnosis not present

## 2018-11-26 NOTE — Telephone Encounter (Signed)
Follow up   Pt is returning call about his appointment. He said he has my chart  Please call

## 2018-11-26 NOTE — Telephone Encounter (Signed)
Returned call to patient and after further discussion he has decided to proceed with a video visit via [place]. Made patient aware that I would send the consent to him via his active MyChart. He verbalized his understanding and appreciation for the return call.  ..   Virtual Visit Pre-Appointment Phone Call  Steps For Call:  1. Confirm consent - "In the setting of the current Covid19 crisis, you are scheduled for a video visit with Dr Meda Coffee on 12/03/2018 at 3:20 PM.  Just as we do with many in-office visits, in order for you to participate in this visit, we must obtain consent.  If you'd like, I can send this to your mychart for you to review.  Otherwise, I can obtain your verbal consent now.  All virtual visits are billed to your insurance company just like a normal visit would be.  By agreeing to a virtual visit, we'd like you to understand that the technology does not allow for your provider to perform an examination, and thus may limit your provider's ability to fully assess your condition.  Finally, though the technology is pretty good, we cannot assure that it will always work on either your or our end, and in the setting of a video visit, we may have to convert it to a phone-only visit.  In either situation, we cannot ensure that we have a secure connection.     2. Advise patient to be prepared with any vital sign or heart rhythm information, their current medicines, and a piece of paper and pen handy for any instructions they may receive the day of their visit  3. Inform patient they will receive a phone call 15 minutes prior to their appointment time (may be from unknown caller ID) so they should be prepared to answer  4. Confirm that appointment type is correct in Epic appointment notes (video vs telephone)    TELEPHONE CALL NOTE  REEGAN BOUFFARD has been deemed a candidate for a follow-up tele-health visit to limit community exposure during the Covid-19 pandemic. I spoke with the  patient via phone to ensure availability of phone/video source, confirm preferred  phone number, and discuss instructions and expectations. Explained and sent consent with simple instructions on how to use [employer] to patients active mychart. The patient was advised to review the section on consent for treatment as well. He verbalized that he will read this prior to [place] calling him several days before his appointment. The patient will receive a phone call 2-3 days prior to his visit at which consent will be verbally confirmed.  I reminded DAMIRE REMEDIOS to be prepared with any vital sign and/or heart rhythm information that could potentially be obtained via home monitoring, at the time of his visit.  Did the patient verbally acknowledge consent to treatment? Yes  Juventino Slovak, CMA 11/26/2018 5:01 PM

## 2018-11-28 ENCOUNTER — Telehealth: Payer: Self-pay | Admitting: Nurse Practitioner

## 2018-11-28 ENCOUNTER — Other Ambulatory Visit: Payer: Self-pay

## 2018-11-28 ENCOUNTER — Inpatient Hospital Stay: Payer: Medicare Other | Attending: Oncology | Admitting: Nurse Practitioner

## 2018-11-28 ENCOUNTER — Inpatient Hospital Stay: Payer: Medicare Other

## 2018-11-28 ENCOUNTER — Encounter: Payer: Self-pay | Admitting: Nurse Practitioner

## 2018-11-28 VITALS — BP 112/63 | HR 106 | Temp 97.6°F | Resp 18 | Ht 73.0 in | Wt 147.3 lb

## 2018-11-28 VITALS — HR 83

## 2018-11-28 DIAGNOSIS — I4891 Unspecified atrial fibrillation: Secondary | ICD-10-CM | POA: Insufficient documentation

## 2018-11-28 DIAGNOSIS — D630 Anemia in neoplastic disease: Secondary | ICD-10-CM | POA: Diagnosis not present

## 2018-11-28 DIAGNOSIS — R5383 Other fatigue: Secondary | ICD-10-CM

## 2018-11-28 DIAGNOSIS — Z7901 Long term (current) use of anticoagulants: Secondary | ICD-10-CM | POA: Insufficient documentation

## 2018-11-28 DIAGNOSIS — Z5111 Encounter for antineoplastic chemotherapy: Secondary | ICD-10-CM | POA: Diagnosis not present

## 2018-11-28 DIAGNOSIS — Z87891 Personal history of nicotine dependence: Secondary | ICD-10-CM | POA: Diagnosis not present

## 2018-11-28 DIAGNOSIS — C3490 Malignant neoplasm of unspecified part of unspecified bronchus or lung: Secondary | ICD-10-CM

## 2018-11-28 DIAGNOSIS — C3491 Malignant neoplasm of unspecified part of right bronchus or lung: Secondary | ICD-10-CM | POA: Diagnosis not present

## 2018-11-28 DIAGNOSIS — Z5112 Encounter for antineoplastic immunotherapy: Secondary | ICD-10-CM | POA: Insufficient documentation

## 2018-11-28 LAB — CMP (CANCER CENTER ONLY)
ALT: 12 U/L (ref 0–44)
AST: 18 U/L (ref 15–41)
Albumin: 2.3 g/dL — ABNORMAL LOW (ref 3.5–5.0)
Alkaline Phosphatase: 81 U/L (ref 38–126)
Anion gap: 12 (ref 5–15)
BUN: 13 mg/dL (ref 8–23)
CO2: 28 mmol/L (ref 22–32)
Calcium: 9.6 mg/dL (ref 8.9–10.3)
Chloride: 99 mmol/L (ref 98–111)
Creatinine: 0.78 mg/dL (ref 0.61–1.24)
GFR, Est AFR Am: >60 mL/min (ref >60)
GFR, Estimated: >60 mL/min (ref >60)
Glucose, Bld: 111 mg/dL — ABNORMAL HIGH (ref 70–99)
Potassium: 4.1 mmol/L (ref 3.5–5.1)
Sodium: 139 mmol/L (ref 135–145)
Total Bilirubin: 0.3 mg/dL (ref 0.3–1.2)
Total Protein: 8.1 g/dL (ref 6.5–8.1)

## 2018-11-28 LAB — CBC WITH DIFFERENTIAL (CANCER CENTER ONLY)
Abs Immature Granulocytes: 0.03 10*3/uL (ref 0.00–0.07)
Basophils Absolute: 0 10*3/uL (ref 0.0–0.1)
Basophils Relative: 0 %
Eosinophils Absolute: 0.1 10*3/uL (ref 0.0–0.5)
Eosinophils Relative: 1 %
HCT: 27.2 % — ABNORMAL LOW (ref 39.0–52.0)
Hemoglobin: 8.2 g/dL — ABNORMAL LOW (ref 13.0–17.0)
Immature Granulocytes: 1 %
Lymphocytes Relative: 12 %
Lymphs Abs: 0.5 10*3/uL — ABNORMAL LOW (ref 0.7–4.0)
MCH: 27.2 pg (ref 26.0–34.0)
MCHC: 30.1 g/dL (ref 30.0–36.0)
MCV: 90.4 fL (ref 80.0–100.0)
Monocytes Absolute: 0.8 10*3/uL (ref 0.1–1.0)
Monocytes Relative: 19 %
Neutro Abs: 2.9 10*3/uL (ref 1.7–7.7)
Neutrophils Relative %: 67 %
Platelet Count: 402 10*3/uL — ABNORMAL HIGH (ref 150–400)
RBC: 3.01 MIL/uL — ABNORMAL LOW (ref 4.22–5.81)
RDW: 20.7 % — ABNORMAL HIGH (ref 11.5–15.5)
WBC Count: 4.3 10*3/uL (ref 4.0–10.5)
nRBC: 0 % (ref 0.0–0.2)

## 2018-11-28 LAB — TSH: TSH: 3.088 u[IU]/mL (ref 0.320–4.118)

## 2018-11-28 MED ORDER — PALONOSETRON HCL INJECTION 0.25 MG/5ML
INTRAVENOUS | Status: AC
  Filled 2018-11-28: qty 5

## 2018-11-28 MED ORDER — DEXAMETHASONE SODIUM PHOSPHATE 10 MG/ML IJ SOLN
10.0000 mg | Freq: Once | INTRAMUSCULAR | Status: AC
  Administered 2018-11-28: 10 mg via INTRAVENOUS

## 2018-11-28 MED ORDER — CYANOCOBALAMIN 1000 MCG/ML IJ SOLN
INTRAMUSCULAR | Status: AC
  Filled 2018-11-28: qty 1

## 2018-11-28 MED ORDER — SODIUM CHLORIDE 0.9 % IV SOLN
515.0000 mg/m2 | Freq: Once | INTRAVENOUS | Status: AC
  Administered 2018-11-28: 13:00:00 1000 mg via INTRAVENOUS
  Filled 2018-11-28: qty 40

## 2018-11-28 MED ORDER — SODIUM CHLORIDE 0.9 % IV SOLN
200.0000 mg | Freq: Once | INTRAVENOUS | Status: AC
  Administered 2018-11-28: 12:00:00 200 mg via INTRAVENOUS
  Filled 2018-11-28: qty 8

## 2018-11-28 MED ORDER — PALONOSETRON HCL INJECTION 0.25 MG/5ML
0.2500 mg | Freq: Once | INTRAVENOUS | Status: AC
  Administered 2018-11-28: 12:00:00 0.25 mg via INTRAVENOUS

## 2018-11-28 MED ORDER — PROCHLORPERAZINE MALEATE 10 MG PO TABS
ORAL_TABLET | ORAL | Status: AC
  Filled 2018-11-28: qty 1

## 2018-11-28 MED ORDER — CYANOCOBALAMIN 1000 MCG/ML IJ SOLN
1000.0000 ug | Freq: Once | INTRAMUSCULAR | Status: AC
  Administered 2018-11-28: 12:00:00 1000 ug via INTRAMUSCULAR

## 2018-11-28 MED ORDER — SODIUM CHLORIDE 0.9 % IV SOLN
Freq: Once | INTRAVENOUS | Status: AC
  Administered 2018-11-28: 11:00:00 via INTRAVENOUS
  Filled 2018-11-28: qty 250

## 2018-11-28 MED ORDER — SODIUM CHLORIDE 0.9 % IV SOLN
377.2000 mg | Freq: Once | INTRAVENOUS | Status: AC
  Administered 2018-11-28: 14:00:00 380 mg via INTRAVENOUS
  Filled 2018-11-28: qty 38

## 2018-11-28 MED ORDER — DEXAMETHASONE SODIUM PHOSPHATE 10 MG/ML IJ SOLN
INTRAMUSCULAR | Status: AC
  Filled 2018-11-28: qty 1

## 2018-11-28 NOTE — Telephone Encounter (Signed)
Scheduled appt per 4/15 los.

## 2018-11-28 NOTE — Progress Notes (Addendum)
  [facility] OFFICE PROGRESS NOTE   Diagnosis: Non-small cell lung cancer  INTERVAL HISTORY:   Brian Ramirez returns as scheduled.  He completed cycle 3 Alimta/carboplatin/pembrolizumab 11/06/2018.  He overall feels well.  No nausea or vomiting.  No mouth sores.  No diarrhea.  No rash.  He has occasional shortness of breath.  Oxygen currently at 3 L.  At home he utilizes 2 L.  No fever or cough.  He has a good appetite.  Objective:  Vital signs in last 24 hours:  Blood pressure 112/63, pulse (!) 106, temperature 97.6 F (36.4 C), temperature source Oral, resp. rate 18, height '6\' 1"'$  (1.854 m), weight 147 lb 4.8 oz (66.8 kg), SpO2 99 %.    Resp: Diminished breath sounds right lung field.  No respiratory distress. Vascular: No leg edema. Neuro: Alert and oriented. Skin: No rash.   Lab Results:  Lab Results  Component Value Date   WBC 4.3 11/28/2018   HGB 8.2 (L) 11/28/2018   HCT 27.2 (L) 11/28/2018   MCV 90.4 11/28/2018   PLT 402 (H) 11/28/2018   NEUTROABS 2.9 11/28/2018    Imaging:  No results found.  Medications: I have reviewed the patient's current medications.  Assessment/Plan: 1. Metastatic non-small cell lung cancer  largeright pleural effusion, right lung mass, right pleural-based masses, left lung nodules  Right thoracentesis 09/04/2018-negative cytology  CT biopsy of right lateral pleural mass 09/07/2018-poorly differentiated non-small cell carcinoma, malignant cells positive for cytokeratin AE1/AE3 and cytokeratin 8/18, histology favoring adenocarcinoma, MSS, tumor mutation burden 5.no EGFR, ALK, BRAF alteration. PDL 1 rearrangement  PDL 1 tumor proportion score-100%   Cycle 1 Alimta/carboplatin 09/22/2018, pembrolizumab 09/28/2018  Cycle 2 Alimta/carboplatin/pembrolizumab 10/15/2018  Cycle 3 Alimta/carboplatin/pembrolizumab 11/06/2018  Cycle 4 Alimta/carboplatin/pembrolizumab 11/28/2018  2.Severe anemia-likely secondary to bleeding in  the right pleural space and metastatic carcinoma, red cell transfusions 09/09/2018 09/10/2018, 09/23/2018 3.Dyspnea secondary to the large right pleural effusion and anemia  Right Pleurx catheter placed 09/11/2018  Chest x-ray 10/09/2018- large right pleural effusion, loculated  Chest x-ray 11/06/2018- no interval change in the right-sided pleural effusion  Pleurx drainage catheter removed 11/13/2018 4.History of tobacco use 5.History of kidney stones 6. Fever-most likely tumor fever, improved 7. Admission 10/08/2018 with rapid atrial fibrillation/flutter-improved with diltiazem, started on Eliquis anticoagulation  Disposition: Brian Ramirez appears stable.  He has completed 3 cycles of Alimta/carboplatin/pembrolizumab.  Plan to proceed with cycle 4 today as scheduled.  He will undergo a restaging CT scan of the chest prior to his next visit.  We reviewed the CBC from today.  He has stable anemia.  Counts are adequate for treatment.  He will return for lab and follow-up in 3 weeks.  He will contact the office in the interim with any problems.  Patient seen with Dr. [name].  Brian Ramirez ANP/GNP-BC   11/28/2018  10:21 AM This was a shared visit with Ned Card.  Brian Ramirez continues to have improved performance status.  He will wean oxygen as tolerated.  He will complete cycle 4 of systemic therapy today.  He will then undergo restaging CTs.  Julieanne Manson, MD

## 2018-11-28 NOTE — Patient Instructions (Signed)
[facility] Discharge Instructions for Patients Receiving Chemotherapy  Today you received the following chemotherapy agents Pembrolizumab (KEYTRUDA), Pemetrexed (ALIMTA) & Carboplatin (PARAPLATIN).  To help prevent nausea and vomiting after your treatment, we encourage you to take your nausea medication as prescribed.   If you develop nausea and vomiting that is not controlled by your nausea medication, call the clinic.   BELOW ARE SYMPTOMS THAT SHOULD BE REPORTED IMMEDIATELY:  *FEVER GREATER THAN 100.5 F  *CHILLS WITH OR WITHOUT FEVER  NAUSEA AND VOMITING THAT IS NOT CONTROLLED WITH YOUR NAUSEA MEDICATION  *UNUSUAL SHORTNESS OF BREATH  *UNUSUAL BRUISING OR BLEEDING  TENDERNESS IN MOUTH AND THROAT WITH OR WITHOUT PRESENCE OF ULCERS  *URINARY PROBLEMS  *BOWEL PROBLEMS  UNUSUAL RASH Items with * indicate a potential emergency and should be followed up as soon as possible.  Feel free to call the clinic should you have any questions or concerns. The clinic phone number is [phone].  Please show the [facility] at check-in to the Emergency Department and triage nurse.  Coronavirus (COVID-19) Are you at risk?  Are you at risk for the Coronavirus (COVID-19)?  To be considered HIGH RISK for Coronavirus (COVID-19), you have to meet the following criteria:  . Traveled to [place], [place], [place], [place] or [place]; or in the [place] to [place], [place], [place], or [place]; and have fever, cough, and shortness of breath within the last 2 weeks of travel OR . Been in close contact with a person diagnosed with COVID-19 within the last 2 weeks and have fever, cough, and shortness of breath . IF YOU DO NOT MEET THESE CRITERIA, YOU ARE CONSIDERED LOW RISK FOR COVID-19.  What to do if you are HIGH RISK for COVID-19?  Marland Kitchen If you are having a medical emergency, call 911. . Seek medical care right away. Before you go to a doctor's office, urgent care  or emergency department, call ahead and tell them about your recent travel, contact with someone diagnosed with COVID-19, and your symptoms. You should receive instructions from your physician's office regarding next steps of care.  . When you arrive at healthcare provider, tell the healthcare staff immediately you have returned from visiting [place], [place], [place], [place] or [place]; or traveled in the [place] to [place], [place], [place], or [place]; in the last two weeks or you have been in close contact with a person diagnosed with COVID-19 in the last 2 weeks.   . Tell the health care staff about your symptoms: fever, cough and shortness of breath. . After you have been seen by a medical provider, you will be either: o Tested for (COVID-19) and discharged home on quarantine except to seek medical care if symptoms worsen, and asked to  - Stay home and avoid contact with others until you get your results (4-5 days)  - Avoid travel on public transportation if possible (such as bus, train, or airplane) or o Sent to the Emergency Department by EMS for evaluation, COVID-19 testing, and possible admission depending on your condition and test results.  What to do if you are LOW RISK for COVID-19?  Reduce your risk of any infection by using the same precautions used for avoiding the common cold or flu:  Marland Kitchen Wash your hands often with soap and warm water for at least 20 seconds.  If soap and water are not readily available, use an alcohol-based hand sanitizer with at least 60% alcohol.  Marland Kitchen  If coughing or sneezing, cover your mouth and nose by coughing or sneezing into the elbow areas of your shirt or coat, into a tissue or into your sleeve (not your hands). . Avoid shaking hands with others and consider head nods or verbal greetings only. . Avoid touching your eyes, nose, or mouth with unwashed hands.  . Avoid close contact with people who are sick. . Avoid places or events with large  numbers of people in one location, like concerts or sporting events. . Carefully consider travel plans you have or are making. . If you are planning any travel outside or inside the [place], visit the CDC's Travelers' Health webpage for the latest health notices. . If you have some symptoms but not all symptoms, continue to monitor at home and seek medical attention if your symptoms worsen. . If you are having a medical emergency, call 911.   [facility] / e-Visit: [email or web address]         [facility]: [facility]: [phone]                   [facility]: [phone]

## 2018-11-29 DIAGNOSIS — I5031 Acute diastolic (congestive) heart failure: Secondary | ICD-10-CM | POA: Diagnosis not present

## 2018-11-29 DIAGNOSIS — Z48813 Encounter for surgical aftercare following surgery on the respiratory system: Secondary | ICD-10-CM | POA: Diagnosis not present

## 2018-11-29 DIAGNOSIS — C3411 Malignant neoplasm of upper lobe, right bronchus or lung: Secondary | ICD-10-CM | POA: Diagnosis not present

## 2018-11-29 DIAGNOSIS — I4891 Unspecified atrial fibrillation: Secondary | ICD-10-CM | POA: Diagnosis not present

## 2018-11-29 DIAGNOSIS — J910 Malignant pleural effusion: Secondary | ICD-10-CM | POA: Diagnosis not present

## 2018-11-29 DIAGNOSIS — J9621 Acute and chronic respiratory failure with hypoxia: Secondary | ICD-10-CM | POA: Diagnosis not present

## 2018-12-03 ENCOUNTER — Other Ambulatory Visit: Payer: Self-pay

## 2018-12-03 ENCOUNTER — Telehealth (INDEPENDENT_AMBULATORY_CARE_PROVIDER_SITE_OTHER): Payer: Medicare Other | Admitting: Cardiology

## 2018-12-03 ENCOUNTER — Encounter: Payer: Self-pay | Admitting: Cardiology

## 2018-12-03 ENCOUNTER — Encounter: Payer: Self-pay | Admitting: *Deleted

## 2018-12-03 DIAGNOSIS — Z7901 Long term (current) use of anticoagulants: Secondary | ICD-10-CM

## 2018-12-03 DIAGNOSIS — R600 Localized edema: Secondary | ICD-10-CM

## 2018-12-03 DIAGNOSIS — I5042 Chronic combined systolic (congestive) and diastolic (congestive) heart failure: Secondary | ICD-10-CM | POA: Diagnosis not present

## 2018-12-03 DIAGNOSIS — I4892 Unspecified atrial flutter: Secondary | ICD-10-CM | POA: Diagnosis not present

## 2018-12-03 MED ORDER — ASPIRIN EC 81 MG PO TBEC: 81.0000 mg | DELAYED_RELEASE_TABLET | Freq: Every day | ORAL | 3 refills | Status: AC

## 2018-12-03 MED ORDER — DILTIAZEM HCL ER COATED BEADS 120 MG PO CP24: 120.0000 mg | ORAL_CAPSULE | Freq: Every day | ORAL | 3 refills | Status: DC

## 2018-12-03 NOTE — Patient Instructions (Addendum)
Medication Instructions:   FINISH YOUR CURRENT SUPPLY OF ELIQUIS AND THEN ONCE THIS IS EXHAUSTED, PLEASE CHANGE YOUR REGIMEN TO TAKING ASPIRIN 81 MG BY MOUTH DAILY THEREAFTER.  FINISH YOUR CURRENT SUPPLY OF CARDIZEM CD 240 MG BY MOUTH DAILY, AND THEN ONCE THIS IS EXHAUSTED, PLEASE DECREASE YOUR CARDIZEM CD TO 120 MG BY MOUTH DAILY THEREAFTER.  THE NEW DOSE CHANGE WAS SENT INTO YOUR PHARMACY WITH A NOTE TO FILL LATER, WHEN YOU CALL.     If you need a refill on your cardiac medications before your next appointment, please call your pharmacy.     Follow-Up:  3 MONTHS WITH DR Meda Coffee AS A VIRTUAL VIDEO VISIT AGAIN ON Thursday 03/07/19 AT 9 AM

## 2018-12-03 NOTE — Patient Outreach (Signed)
[facility]) Care Management  12/03/2018  Brian Ramirez [birth year] [id number]   Successful follow up call to patient regarding social work referral for transportation resources.  Patient reports that his cardiology appointment on 12/06/18 is now a virtual visit due to [place].  Patient progressing in his recover and denies the need for transportation resources at this time.  BSW closing case.  Ronn Melena, BSW Social Worker [phone]

## 2018-12-03 NOTE — Progress Notes (Signed)
Virtual Visit via Video Note   This visit type was conducted due to national recommendations for restrictions regarding the COVID-19 Pandemic (e.g. social distancing) in an effort to limit this patient's exposure and mitigate transmission in our community.  Due to his co-morbid illnesses, this patient is at least at moderate risk for complications without adequate follow up.  This format is felt to be most appropriate for this patient at this time.  All issues noted in this document were discussed and addressed.  A limited physical exam was performed with this format.  Please refer to the patient's chart for his consent to telehealth for [facility].   Evaluation Performed:  Follow-up visit  Date:  12/03/2018   ID:  Brian Ramirez, Brian Ramirez 1945/12/17, MRN [id number]  Patient Location: Home Provider Location: Home  PCP:  [name], MD  Cardiologist:  [name], MD   Reason for visit 3 months follow up  History of Present Illness:    Brian Ramirez is a 73 y.o. male with with history of metastatic non-small cell lung CA, right pleural effusion status post Pleurx catheter, anemia was found to have new onset atrial flutter with RVR during blood transfusion for anemia 10/11/2018.  2D echo normal LVEF 55% left atrium was moderately dilated.  Patient was placed on diltiazem and amiodarone initially with plans not to use long-term because of lung cancer. The plan was to d/c amiodarone at the follow up visit if he remains in SR.  Discharged on Eliquis.  ChadsVasc equals 1 but with his cancer he may be at increased risk of stroke. He did have a bloody pleural effusion.  If he continues to bleed recommend aspirin 81 mg daily.  Lower extremity edema improved with Lasix.  He was seen by [name] on 10/14/2018, he was in normal sinus rhythm.  Dr. [name] decreased his Lasix to once a day and he has had no further swelling.  12/03/2018 - 3 months follow up, he continues with  chemotherapy - feels tired after it but otherwise feels very well, improved energy and SOB. He has significant nosebleeds- on home O2 and nose bleeds with every blowing. No CP, palpitations, dizziness, no DOE.   The patient does not have symptoms concerning for COVID-19 infection (fever, chills, cough, or new shortness of breath).   Past Medical History:  Diagnosis Date  . Acute congestive heart failure ([place])   . Anemia of chronic disease 10/08/2018  . Anticoagulated   . Atrial flutter with rapid ventricular response ([place]) 10/08/2018  . Difficult airway for intubation 09/26/2016  . Edema of both lower extremities 10/09/2018  . Goals of care, counseling/discussion 09/20/2018  . Hernia 2012  . History of hiatal hernia   . History of kidney stones   . Left inguinal hernia s/p lap repair 11/22/2016 09/26/2016  . Lung cancer ([place]) 09/20/2018  . Lung mass 09/04/2018  . Malnutrition of moderate degree 09/20/2018  . Mass of right lung 09/04/2018  . New onset atrial fibrillation ([name])   . Personal history of kidney stones 1992  . Pleural effusion 09/04/2018  . Pleural effusion on right   . Pressure injury of skin 09/20/2018  . SOB (shortness of breath) 09/19/2018   Past Surgical History:  Procedure Laterality Date  . HERNIA REPAIR  2012   inguinal  . INGUINAL HERNIA REPAIR Left 11/22/2016   Procedure: LAPAROSCOPIC  REPAIR OF LEFT INGUINAL HERNIA WITH MESH;  Surgeon: [name], MD;  Location: [facility]S;  Service:  General;  Laterality: Left;  . IR INSTILL VIA CHEST TUBE AGENT FOR FIBRINOLYSIS INI DAY  10/10/2018  . IR INSTILL VIA CHEST TUBE AGENT FOR FIBRINOLYSIS INI DAY  10/31/2018  . IR PERC PLEURAL DRAIN W/INDWELL CATH W/IMG GUIDE  09/11/2018  . IR REMOVAL OF PLURAL CATH W/CUFF  11/13/2018     Current Meds  Medication Sig  . apixaban (ELIQUIS) 5 MG TABS tablet Take 1 tablet (5 mg total) by mouth 2 (two) times daily.  Marland Kitchen diltiazem (CARDIZEM CD) 240 MG 24 hr capsule Take 1 capsule (240 mg total) by mouth  daily.  Marland Kitchen docusate sodium (COLACE) 100 MG capsule Take 100 mg by mouth 2 (two) times daily.  . folic acid (FOLVITE) 1 MG tablet Take 1 mg by mouth daily.  Marland Kitchen guaiFENesin (MUCINEX) 600 MG 12 hr tablet Take 1 tablet (600 mg total) by mouth 2 (two) times daily.  . Nutritional Supplements (NUTRITIONAL DRINK PO) Take 1 Bottle by mouth 2 (two) times a day.   . traMADol (ULTRAM) 50 MG tablet Take 1 tablet (50 mg total) by mouth every 6 (six) hours as needed for moderate pain or severe pain.     Allergies:   Patient has no known allergies.   Social History   Tobacco Use  . Smoking status: Never Smoker  . Smokeless tobacco: Never Used  Substance Use Topics  . Alcohol use: Yes    Alcohol/week: 1.0 standard drinks    Types: 1 Glasses of wine per week  . Drug use: No     Family Hx: The patient's family history includes Cancer in his brother; Heart disease in his father.  ROS:   Please see the history of present illness.    All other systems reviewed and are negative.   Prior CV studies:   The following studies were reviewed today:  TTE 10/09/2018  Echo 10/09/2018 1. The left ventricle has a visually estimated ejection fraction of of 55%. The cavity size was normal. Left ventricular diastolic parameters were normal No evidence of left ventricular regional wall motion abnormalities. 2. The right ventricle has normal systolic function. The cavity was normal. There is no increase in right ventricular wall thickness. 3. Left atrial size was moderately dilated. 4. The tricuspid valve is normal in structure. 5. The aortic valve is tricuspid Mild calcification of the aortic valve. no stenosis of the aortic valve. 6. The pulmonic valve was normal in structure. 7. The aortic root and ascending aorta are normal in size and structure. 8. The mitral valve is normal in structure. No evidence of mitral valve stenosis. Trivial mitral regurgitation. 9. Normal IVC size. PA systolic pressure 37  mmHg.  Labs/Other Tests and Data Reviewed:    EKG:  No ECG reviewed.  Recent Labs: 10/10/2018: B Natriuretic Peptide 360.9 10/11/2018: Magnesium 1.7 11/28/2018: ALT 12; BUN 13; Creatinine 0.78; Hemoglobin 8.2; Platelet Count 402; Potassium 4.1; Sodium 139; TSH 3.088   Recent Lipid Panel No results found for: CHOL, TRIG, HDL, CHOLHDL, LDLCALC, LDLDIRECT  Wt Readings from Last 3 Encounters:  12/03/18 150 lb (68 kg)  11/28/18 147 lb 4.8 oz (66.8 kg)  11/06/18 148 lb 1.6 oz (67.2 kg)     Objective:    Vital Signs:  BP 120/60   Pulse 80   Ht 6\' 1"  (1.854 m)   Wt 150 lb (68 kg)   SpO2 98%   BMI 19.79 kg/m    VITAL SIGNS:  reviewed  ASSESSMENT & PLAN:    1.  New onset atrial fibrillation in the setting of anemia during blood transfusion treated with amiodarone and diltiazem. Off amiodarone, remains in SR, regular HR in 80', we will decrease cardizem to 120 mg po daily as he has some hypotensive episodes.  He has significant nosebleeds and ongoing chronic anemia, most recent Hb 8.2. CHADS-VASc 1, we will d/c eliquis once he finishes this month and start ASA 81 mg po daily.  Chronic diastolic CHF - euvolemic, we will check echo after the next visit as he is on cheotherapy.  Metastatic non-small cell lung CA followed by oncology   COVID-19 Education: The signs and symptoms of COVID-19 were discussed with the patient and how to seek care for testing (follow up with PCP or arrange E-visit).  The importance of social distancing was discussed today.  Time:   Today, I have spent 20 minutes with the patient with telehealth technology discussing the above problems.     Medication Adjustments/Labs and Tests Ordered: Current medicines are reviewed at length with the patient today.  Concerns regarding medicines are outlined above.   Tests Ordered: No orders of the defined types were placed in this encounter.   Medication Changes: No orders of the defined types were placed in this  encounter.   Disposition:  Follow up in 3 month(s)  Signed, [name], MD  12/03/2018 3:55 PM    [place]

## 2018-12-05 DIAGNOSIS — I5031 Acute diastolic (congestive) heart failure: Secondary | ICD-10-CM | POA: Diagnosis not present

## 2018-12-05 DIAGNOSIS — Z48813 Encounter for surgical aftercare following surgery on the respiratory system: Secondary | ICD-10-CM | POA: Diagnosis not present

## 2018-12-05 DIAGNOSIS — I4891 Unspecified atrial fibrillation: Secondary | ICD-10-CM | POA: Diagnosis not present

## 2018-12-05 DIAGNOSIS — J9621 Acute and chronic respiratory failure with hypoxia: Secondary | ICD-10-CM | POA: Diagnosis not present

## 2018-12-05 DIAGNOSIS — C3411 Malignant neoplasm of upper lobe, right bronchus or lung: Secondary | ICD-10-CM | POA: Diagnosis not present

## 2018-12-05 DIAGNOSIS — J910 Malignant pleural effusion: Secondary | ICD-10-CM | POA: Diagnosis not present

## 2018-12-06 ENCOUNTER — Telehealth: Payer: Medicare Other | Admitting: Cardiology

## 2018-12-07 ENCOUNTER — Other Ambulatory Visit: Payer: Self-pay | Admitting: Cardiology

## 2018-12-07 MED ORDER — DILTIAZEM HCL ER COATED BEADS 120 MG PO CP24: 120.0000 mg | ORAL_CAPSULE | Freq: Every day | ORAL | 3 refills | Status: DC

## 2018-12-07 NOTE — Telephone Encounter (Signed)
Pt calling stating that he was finished with his diltiazem 240 mg and needed a Rx for diltiazem 120 mg tablet sent to his pharmacy as Dr. Meda Coffee stated. Pt's medication was sent to his pharmacy as requested. Confirmation received.

## 2018-12-12 ENCOUNTER — Telehealth: Payer: Self-pay | Admitting: *Deleted

## 2018-12-12 NOTE — Telephone Encounter (Signed)
Called to report GI scheduled his CT scan on 01/17/19 and he sees MD on 12/19/18. Called GI and rescheduled his CT chest for 12/14/18 at 4:10 with same prep directions. Patient notified.

## 2018-12-14 ENCOUNTER — Other Ambulatory Visit: Payer: Self-pay

## 2018-12-14 ENCOUNTER — Ambulatory Visit
Admission: RE | Admit: 2018-12-14 | Discharge: 2018-12-14 | Disposition: A | Discharge status: Home / Self Care | Payer: Medicare Other | Source: Ambulatory Visit | Attending: Nurse Practitioner | Admitting: Nurse Practitioner

## 2018-12-14 DIAGNOSIS — C3491 Malignant neoplasm of unspecified part of right bronchus or lung: Secondary | ICD-10-CM

## 2018-12-14 MED ORDER — IOPAMIDOL (ISOVUE-300) INJECTION 61%
75.0000 mL | Freq: Once | INTRAVENOUS | Status: AC | PRN
  Administered 2018-12-14: 75 mL via INTRAVENOUS

## 2018-12-16 ENCOUNTER — Other Ambulatory Visit: Payer: Self-pay | Admitting: Oncology

## 2018-12-19 ENCOUNTER — Telehealth: Payer: Self-pay | Admitting: Nurse Practitioner

## 2018-12-19 ENCOUNTER — Inpatient Hospital Stay: Payer: Medicare Other | Attending: Oncology

## 2018-12-19 ENCOUNTER — Other Ambulatory Visit: Payer: Self-pay

## 2018-12-19 ENCOUNTER — Inpatient Hospital Stay: Payer: Medicare Other

## 2018-12-19 ENCOUNTER — Inpatient Hospital Stay (HOSPITAL_BASED_OUTPATIENT_CLINIC_OR_DEPARTMENT_OTHER): Payer: Medicare Other | Admitting: Nurse Practitioner

## 2018-12-19 ENCOUNTER — Encounter: Payer: Self-pay | Admitting: Nurse Practitioner

## 2018-12-19 VITALS — BP 151/83 | HR 100 | Temp 99.7°F | Resp 17 | Ht 73.0 in | Wt 149.4 lb

## 2018-12-19 DIAGNOSIS — C3491 Malignant neoplasm of unspecified part of right bronchus or lung: Secondary | ICD-10-CM | POA: Diagnosis not present

## 2018-12-19 DIAGNOSIS — Z7901 Long term (current) use of anticoagulants: Secondary | ICD-10-CM

## 2018-12-19 DIAGNOSIS — Z79899 Other long term (current) drug therapy: Secondary | ICD-10-CM | POA: Insufficient documentation

## 2018-12-19 DIAGNOSIS — Z87891 Personal history of nicotine dependence: Secondary | ICD-10-CM | POA: Insufficient documentation

## 2018-12-19 DIAGNOSIS — R5383 Other fatigue: Secondary | ICD-10-CM

## 2018-12-19 DIAGNOSIS — D649 Anemia, unspecified: Secondary | ICD-10-CM | POA: Insufficient documentation

## 2018-12-19 DIAGNOSIS — Z5111 Encounter for antineoplastic chemotherapy: Secondary | ICD-10-CM | POA: Insufficient documentation

## 2018-12-19 DIAGNOSIS — C3490 Malignant neoplasm of unspecified part of unspecified bronchus or lung: Secondary | ICD-10-CM

## 2018-12-19 DIAGNOSIS — I4891 Unspecified atrial fibrillation: Secondary | ICD-10-CM | POA: Insufficient documentation

## 2018-12-19 DIAGNOSIS — Z5112 Encounter for antineoplastic immunotherapy: Secondary | ICD-10-CM | POA: Diagnosis not present

## 2018-12-19 LAB — CBC WITH DIFFERENTIAL (CANCER CENTER ONLY)
Abs Immature Granulocytes: 0.02 10*3/uL (ref 0.00–0.07)
Basophils Absolute: 0 10*3/uL (ref 0.0–0.1)
Basophils Relative: 0 %
Eosinophils Absolute: 0.1 10*3/uL (ref 0.0–0.5)
Eosinophils Relative: 1 %
HCT: 27.8 % — ABNORMAL LOW (ref 39.0–52.0)
Hemoglobin: 8.5 g/dL — ABNORMAL LOW (ref 13.0–17.0)
Immature Granulocytes: 0 %
Lymphocytes Relative: 16 %
Lymphs Abs: 0.8 10*3/uL (ref 0.7–4.0)
MCH: 28.2 pg (ref 26.0–34.0)
MCHC: 30.6 g/dL (ref 30.0–36.0)
MCV: 92.4 fL (ref 80.0–100.0)
Monocytes Absolute: 0.8 10*3/uL (ref 0.1–1.0)
Monocytes Relative: 16 %
Neutro Abs: 3.2 10*3/uL (ref 1.7–7.7)
Neutrophils Relative %: 67 %
Platelet Count: 284 10*3/uL (ref 150–400)
RBC: 3.01 MIL/uL — ABNORMAL LOW (ref 4.22–5.81)
RDW: 21.9 % — ABNORMAL HIGH (ref 11.5–15.5)
WBC Count: 4.9 10*3/uL (ref 4.0–10.5)
nRBC: 0 % (ref 0.0–0.2)

## 2018-12-19 LAB — CMP (CANCER CENTER ONLY)
ALT: 14 U/L (ref 0–44)
AST: 19 U/L (ref 15–41)
Albumin: 2.6 g/dL — ABNORMAL LOW (ref 3.5–5.0)
Alkaline Phosphatase: 93 U/L (ref 38–126)
Anion gap: 10 (ref 5–15)
BUN: 23 mg/dL (ref 8–23)
CO2: 28 mmol/L (ref 22–32)
Calcium: 9.6 mg/dL (ref 8.9–10.3)
Chloride: 100 mmol/L (ref 98–111)
Creatinine: 0.87 mg/dL (ref 0.61–1.24)
GFR, Est AFR Am: >60 mL/min (ref >60)
GFR, Estimated: >60 mL/min (ref >60)
Glucose, Bld: 110 mg/dL — ABNORMAL HIGH (ref 70–99)
Potassium: 4.4 mmol/L (ref 3.5–5.1)
Sodium: 138 mmol/L (ref 135–145)
Total Bilirubin: 0.2 mg/dL — ABNORMAL LOW (ref 0.3–1.2)
Total Protein: 7.9 g/dL (ref 6.5–8.1)

## 2018-12-19 MED ORDER — SODIUM CHLORIDE 0.9 % IV SOLN
Freq: Once | INTRAVENOUS | Status: AC
  Administered 2018-12-19: 10:00:00 via INTRAVENOUS
  Filled 2018-12-19: qty 250

## 2018-12-19 MED ORDER — SODIUM CHLORIDE 0.9 % IV SOLN
200.0000 mg | Freq: Once | INTRAVENOUS | Status: AC
  Administered 2018-12-19: 200 mg via INTRAVENOUS
  Filled 2018-12-19: qty 8

## 2018-12-19 NOTE — Telephone Encounter (Signed)
Scheduled appt per 5/6 los.

## 2018-12-19 NOTE — Patient Instructions (Signed)
[facility] Discharge Instructions for Patients Receiving Chemotherapy  Today you received the following immunotherapy agents:  Beryle Flock  To help prevent nausea and vomiting after your treatment, we encourage you to take your nausea medication as needed.   If you develop nausea and vomiting that is not controlled by your nausea medication, call the clinic.   BELOW ARE SYMPTOMS THAT SHOULD BE REPORTED IMMEDIATELY:  *FEVER GREATER THAN 100.5 F  *CHILLS WITH OR WITHOUT FEVER  NAUSEA AND VOMITING THAT IS NOT CONTROLLED WITH YOUR NAUSEA MEDICATION  *UNUSUAL SHORTNESS OF BREATH  *UNUSUAL BRUISING OR BLEEDING  TENDERNESS IN MOUTH AND THROAT WITH OR WITHOUT PRESENCE OF ULCERS  *URINARY PROBLEMS  *BOWEL PROBLEMS  UNUSUAL RASH Items with * indicate a potential emergency and should be followed up as soon as possible.  Feel free to call the clinic should you have any questions or concerns. The clinic phone number is [phone].  Please show the [facility] at check-in to the Emergency Department and triage nurse.

## 2018-12-19 NOTE — Progress Notes (Addendum)
[facility] OFFICE PROGRESS NOTE   Diagnosis: Non-small cell lung cancer  INTERVAL HISTORY:   Brian Ramirez returns as scheduled.  He completed cycle 4 Alimta/carboplatin/pembrolizumab 11/28/2018.  He overall feels well.  He denies nausea/vomiting.  He intermittently has mouth sores.  He feels this is likely secondary to dentures.  No diarrhea.  No skin rash.  Leg swelling continues to be improved.  He periodically notes swelling of the left foot.  This improves overnight.  Breathing continues to be improved.  He is currently on oxygen at 2 L/min.  He intermittently goes without oxygen.  He has noted hair thinning/scalp tenderness.  Objective:  Vital signs in last 24 hours:  Blood pressure (!) 151/83, pulse 100, temperature 99.7 F (37.6 C), temperature source Oral, resp. rate 17, height _0  (1.854 m), weight 149 lb 6.4 oz (67.8 kg), SpO2 99 %.    HEENT: No thrush or ulcers. Resp: Diminished breath sounds right lung field.  No respiratory distress. Vascular: No leg edema. Neuro: Alert and oriented. Skin: No rash.   Lab Results:  Lab Results  Component Value Date   WBC 4.9 12/19/2018   HGB 8.5 (L) 12/19/2018   HCT 27.8 (L) 12/19/2018   MCV 92.4 12/19/2018   PLT 284 12/19/2018   NEUTROABS 3.2 12/19/2018    Imaging:  No results found.  Medications: I have reviewed the patient's current medications.  Assessment/Plan: 1.Metastatic non-small cell lung cancer  largeright pleural effusion, right lung mass, right pleural-based masses, left lung nodules  Right thoracentesis 09/04/2018-negative cytology  CT biopsy of right lateral pleural mass 09/07/2018-poorly differentiated non-small cell carcinoma, malignant cells positive for cytokeratin AE1/AE3 and cytokeratin 8/18, histology favoring adenocarcinoma, MSS, tumor mutation burden 5.no EGFR, ALK, BRAF alteration. PDL 1 rearrangement  PDL 1 tumor proportion score-100%   Cycle 1 Alimta/carboplatin  09/22/2018, pembrolizumab 09/28/2018  Cycle 2 Alimta/carboplatin/pembrolizumab 10/15/2018  Cycle 3 Alimta/carboplatin/pembrolizumab 11/06/2018  Cycle 4 Alimta/carboplatin/pembrolizumab 11/28/2018  CT chest 12/14/2018- response to therapy of lung primary/primaries, thoracic nodal and pulmonary metastasis.  Decrease in right pleural effusion with improvement in pleural-based metastasis.  New T10 heterogeneous sclerotic density likely due to healing of previously CT occult metastasis.  Pembrolizumab 12/19/2018  2.Severe anemia-likely secondary to bleeding in the right pleural space and metastatic carcinoma, red cell transfusions 09/09/2018 09/10/2018, 09/23/2018 3.Dyspnea secondary to the large right pleural effusion and anemia  Right Pleurx catheter placed 09/11/2018  Chest x-ray 10/09/2018-large right pleural effusion, loculated  Chest x-ray 11/06/2018- no interval change in the right-sided pleural effusion  Pleurx drainage catheter removed 11/13/2018 4.History of tobacco use 5.History of kidney stones 6. Fever-most likely tumor fever, improved 7. Admission 10/08/2018 with rapid atrial fibrillation/flutter-improved with diltiazem, started on Eliquis anticoagulation.  Eliquis discontinued.  Now on aspirin.   Disposition: Brian Ramirez appears well.  He has completed 4 cycles of carboplatin/Alimta/pembrolizumab.  Recent restaging chest CT shows significant improvement.  Dr. [name] recommends continuation of pembrolizumab alone.  He will return for lab, follow-up, pembrolizumab in 3 weeks.  He will contact the office in the interim with any problems.  Patient seen with Dr. [name].  CT images reviewed on the computer with Brian Ramirez.  25 minutes were spent face-to-face at today's visit with the majority of that time involved in counseling/coordination of care.   Miklo Aken ANP/GNP-BC   12/19/2018  9:01 AM  This was a shared visit with Ned Card.  Brian Ramirez appears well.  His  performance status is much improved since beginning systemic  therapy.  The restaging chest CT confirms a response to therapy.  We decided to discontinue the Alimta/carboplatin and continue every 3-week pembrolizumab.  We will consider changing to a 6-week schedule if he remains in remission at the next restaging CT.  He will wean oxygen therapy as tolerated.  Julieanne Manson, MD

## 2019-01-07 ENCOUNTER — Other Ambulatory Visit: Payer: Self-pay | Admitting: Oncology

## 2019-01-07 NOTE — Progress Notes (Signed)
START OFF PATHWAY REGIMEN - Non-Small Cell Lung   [id number]:Pemetrexed 500 mg/m2 q21 Days:   A cycle is every 21 days:     Pemetrexed   **Always confirm dose/schedule in your pharmacy ordering system**  Patient Characteristics: Stage IV Metastatic, Nonsquamous, Initial Chemotherapy/Immunotherapy, PS = 0, 1, ALK or EGFR or ROS1 or NTRK Genomic Alterations - Awaiting Test Results AJCC T Category: Staged < 8th Ed. Current Disease Status: Distant Metastases AJCC N Category: Staged < 8th Ed. AJCC M Category: Staged < 8th Ed. AJCC 8 Stage Grouping: Staged < 8th Ed. Histology: Nonsquamous Cell ROS1 Rearrangement Status: Awaiting Test Results T790M Mutation Status: Not Applicable - EGFR Mutation Negative/Unknown Other Mutations/Biomarkers: No Other Actionable Mutations NTRK Gene Fusion Status: Awaiting Test Results PD-L1 Expression Status: Awaiting Test Results Chemotherapy/Immunotherapy LOT: Initial Chemotherapy/Immunotherapy Molecular Targeted Therapy: Not Appropriate ALK Rearrangement Status: Awaiting Test Results EGFR Mutation Status: Awaiting Test Results BRAF V600E Mutation Status: Awaiting Test Results ECOG Performance Status: 1 Intent of Therapy: Non-Curative / Palliative Intent, Discussed with Patient

## 2019-01-09 ENCOUNTER — Other Ambulatory Visit: Payer: Self-pay

## 2019-01-09 ENCOUNTER — Inpatient Hospital Stay (HOSPITAL_BASED_OUTPATIENT_CLINIC_OR_DEPARTMENT_OTHER): Payer: Medicare Other | Admitting: Oncology

## 2019-01-09 ENCOUNTER — Inpatient Hospital Stay: Payer: Medicare Other

## 2019-01-09 VITALS — BP 138/74 | HR 100 | Temp 99.1°F | Resp 18 | Ht 73.0 in | Wt 151.2 lb

## 2019-01-09 DIAGNOSIS — C3490 Malignant neoplasm of unspecified part of unspecified bronchus or lung: Secondary | ICD-10-CM

## 2019-01-09 DIAGNOSIS — D649 Anemia, unspecified: Secondary | ICD-10-CM | POA: Diagnosis not present

## 2019-01-09 DIAGNOSIS — Z79899 Other long term (current) drug therapy: Secondary | ICD-10-CM | POA: Diagnosis not present

## 2019-01-09 DIAGNOSIS — Z87891 Personal history of nicotine dependence: Secondary | ICD-10-CM

## 2019-01-09 DIAGNOSIS — Z5111 Encounter for antineoplastic chemotherapy: Secondary | ICD-10-CM | POA: Diagnosis not present

## 2019-01-09 DIAGNOSIS — I4891 Unspecified atrial fibrillation: Secondary | ICD-10-CM | POA: Diagnosis not present

## 2019-01-09 DIAGNOSIS — C3491 Malignant neoplasm of unspecified part of right bronchus or lung: Secondary | ICD-10-CM

## 2019-01-09 DIAGNOSIS — R5383 Other fatigue: Secondary | ICD-10-CM

## 2019-01-09 DIAGNOSIS — Z5112 Encounter for antineoplastic immunotherapy: Secondary | ICD-10-CM | POA: Diagnosis not present

## 2019-01-09 LAB — CMP (CANCER CENTER ONLY)
ALT: 12 U/L (ref 0–44)
AST: 20 U/L (ref 15–41)
Albumin: 2.9 g/dL — ABNORMAL LOW (ref 3.5–5.0)
Alkaline Phosphatase: 98 U/L (ref 38–126)
Anion gap: 10 (ref 5–15)
BUN: 20 mg/dL (ref 8–23)
CO2: 28 mmol/L (ref 22–32)
Calcium: 9.8 mg/dL (ref 8.9–10.3)
Chloride: 100 mmol/L (ref 98–111)
Creatinine: 0.84 mg/dL (ref 0.61–1.24)
GFR, Est AFR Am: >60 mL/min (ref >60)
GFR, Estimated: >60 mL/min (ref >60)
Glucose, Bld: 105 mg/dL — ABNORMAL HIGH (ref 70–99)
Potassium: 4.2 mmol/L (ref 3.5–5.1)
Sodium: 138 mmol/L (ref 135–145)
Total Bilirubin: 0.2 mg/dL — ABNORMAL LOW (ref 0.3–1.2)
Total Protein: 8.3 g/dL — ABNORMAL HIGH (ref 6.5–8.1)

## 2019-01-09 LAB — CBC WITH DIFFERENTIAL (CANCER CENTER ONLY)
Abs Immature Granulocytes: 0.01 10*3/uL (ref 0.00–0.07)
Basophils Absolute: 0 10*3/uL (ref 0.0–0.1)
Basophils Relative: 0 %
Eosinophils Absolute: 0.1 10*3/uL (ref 0.0–0.5)
Eosinophils Relative: 2 %
HCT: 33.3 % — ABNORMAL LOW (ref 39.0–52.0)
Hemoglobin: 10 g/dL — ABNORMAL LOW (ref 13.0–17.0)
Immature Granulocytes: 0 %
Lymphocytes Relative: 14 %
Lymphs Abs: 1 10*3/uL (ref 0.7–4.0)
MCH: 29 pg (ref 26.0–34.0)
MCHC: 30 g/dL (ref 30.0–36.0)
MCV: 96.5 fL (ref 80.0–100.0)
Monocytes Absolute: 0.9 10*3/uL (ref 0.1–1.0)
Monocytes Relative: 13 %
Neutro Abs: 4.8 10*3/uL (ref 1.7–7.7)
Neutrophils Relative %: 71 %
Platelet Count: 273 10*3/uL (ref 150–400)
RBC: 3.45 MIL/uL — ABNORMAL LOW (ref 4.22–5.81)
RDW: 16.9 % — ABNORMAL HIGH (ref 11.5–15.5)
WBC Count: 6.8 10*3/uL (ref 4.0–10.5)
nRBC: 0 % (ref 0.0–0.2)

## 2019-01-09 LAB — TSH: TSH: 4.154 u[IU]/mL — ABNORMAL HIGH (ref 0.320–4.118)

## 2019-01-09 MED ORDER — SODIUM CHLORIDE 0.9 % IV SOLN
480.0000 mg/m2 | Freq: Once | INTRAVENOUS | Status: AC
  Administered 2019-01-09: 11:00:00 900 mg via INTRAVENOUS
  Filled 2019-01-09: qty 16

## 2019-01-09 MED ORDER — SODIUM CHLORIDE 0.9 % IV SOLN
Freq: Once | INTRAVENOUS | Status: AC
  Administered 2019-01-09: 10:00:00 via INTRAVENOUS
  Filled 2019-01-09: qty 250

## 2019-01-09 MED ORDER — SODIUM CHLORIDE 0.9 % IV SOLN
200.0000 mg | Freq: Once | INTRAVENOUS | Status: AC
  Administered 2019-01-09: 10:00:00 200 mg via INTRAVENOUS
  Filled 2019-01-09: qty 8

## 2019-01-09 MED ORDER — PROCHLORPERAZINE MALEATE 10 MG PO TABS
ORAL_TABLET | ORAL | Status: AC
  Filled 2019-01-09: qty 1

## 2019-01-09 MED ORDER — PROCHLORPERAZINE MALEATE 10 MG PO TABS
10.0000 mg | ORAL_TABLET | Freq: Once | ORAL | Status: AC
  Administered 2019-01-09: 10 mg via ORAL

## 2019-01-09 NOTE — Patient Instructions (Signed)
[facility] Discharge Instructions for Patients Receiving Chemotherapy  Today you received the following chemotherapy agents Alimta and other agent: Keytruda  To help prevent nausea and vomiting after your treatment, we encourage you to take your nausea medication as directed by your MD.   If you develop nausea and vomiting that is not controlled by your nausea medication, call the clinic.   BELOW ARE SYMPTOMS THAT SHOULD BE REPORTED IMMEDIATELY:  *FEVER GREATER THAN 100.5 F  *CHILLS WITH OR WITHOUT FEVER  NAUSEA AND VOMITING THAT IS NOT CONTROLLED WITH YOUR NAUSEA MEDICATION  *UNUSUAL SHORTNESS OF BREATH  *UNUSUAL BRUISING OR BLEEDING  TENDERNESS IN MOUTH AND THROAT WITH OR WITHOUT PRESENCE OF ULCERS  *URINARY PROBLEMS  *BOWEL PROBLEMS  UNUSUAL RASH Items with * indicate a potential emergency and should be followed up as soon as possible.  Feel free to call the clinic should you have any questions or concerns. The clinic phone number is [phone].  Please show the [facility] at check-in to the Emergency Department and triage nurse.  Coronavirus (COVID-19) Are you at risk?  Are you at risk for the Coronavirus (COVID-19)?  To be considered HIGH RISK for Coronavirus (COVID-19), you have to meet the following criteria:  . Traveled to [place], [place], [place], [place] or [place]; or in the [place] to [place], [place], [place], or [place]; and have fever, cough, and shortness of breath within the last 2 weeks of travel OR . Been in close contact with a person diagnosed with COVID-19 within the last 2 weeks and have fever, cough, and shortness of breath . IF YOU DO NOT MEET THESE CRITERIA, YOU ARE CONSIDERED LOW RISK FOR COVID-19.  What to do if you are HIGH RISK for COVID-19?  Marland Kitchen If you are having a medical emergency, call 911. . Seek medical care right away. Before you go to a doctor's office, urgent care or emergency department, call  ahead and tell them about your recent travel, contact with someone diagnosed with COVID-19, and your symptoms. You should receive instructions from your physician's office regarding next steps of care.  . When you arrive at healthcare provider, tell the healthcare staff immediately you have returned from visiting [place], [place], [place], [place] or [place]; or traveled in the [place] to [employer], [place], [place], or [place]; in the last two weeks or you have been in close contact with a person diagnosed with COVID-19 in the last 2 weeks.   . Tell the health care staff about your symptoms: fever, cough and shortness of breath. . After you have been seen by a medical provider, you will be either: o Tested for (COVID-19) and discharged home on quarantine except to seek medical care if symptoms worsen, and asked to  - Stay home and avoid contact with others until you get your results (4-5 days)  - Avoid travel on public transportation if possible (such as bus, train, or airplane) or o Sent to the Emergency Department by EMS for evaluation, COVID-19 testing, and possible admission depending on your condition and test results.  What to do if you are LOW RISK for COVID-19?  Reduce your risk of any infection by using the same precautions used for avoiding the common cold or flu:  Marland Kitchen Wash your hands often with soap and warm water for at least 20 seconds.  If soap and water are not readily available, use an alcohol-based hand sanitizer with at least 60%  alcohol.  . If coughing or sneezing, cover your mouth and nose by coughing or sneezing into the elbow areas of your shirt or coat, into a tissue or into your sleeve (not your hands). . Avoid shaking hands with others and consider head nods or verbal greetings only. . Avoid touching your eyes, nose, or mouth with unwashed hands.  . Avoid close contact with people who are sick. . Avoid places or events with large numbers of people in one location,  like concerts or sporting events. . Carefully consider travel plans you have or are making. . If you are planning any travel outside or inside the [place], visit the CDC's Travelers' Health webpage for the latest health notices. . If you have some symptoms but not all symptoms, continue to monitor at home and seek medical attention if your symptoms worsen. . If you are having a medical emergency, call 911.   [place] / e-Visit: [email or web address]         [facility]: [facility]: [phone]                   [facility]: [phone]

## 2019-01-09 NOTE — Progress Notes (Signed)
  [facility] OFFICE PROGRESS NOTE   Diagnosis: Non-small cell lung cancer  INTERVAL HISTORY:   [name] returns as scheduled.  He feels well.  He is not wearing oxygen while in his home.  He is working in the yard.  Good appetite.  No new complaint.  His only concern is the weight he has lost over the past several months.  Objective:  Vital signs in last 24 hours:  Blood pressure 138/74, pulse 100, temperature 99.1 F (37.3 C), temperature source Oral, resp. rate 18, height '6\' 1"'$  (1.854 m), weight 151 lb 3.2 oz (68.6 kg), SpO2 99 %.   Physical examination not performed today secondary to distancing with the COVID pandemic  Lab Results:  Lab Results  Component Value Date   WBC 6.8 01/09/2019   HGB 10.0 (L) 01/09/2019   HCT 33.3 (L) 01/09/2019   MCV 96.5 01/09/2019   PLT 273 01/09/2019   NEUTROABS 4.8 01/09/2019    CMP  Lab Results  Component Value Date   NA 138 01/09/2019   K 4.2 01/09/2019   CL 100 01/09/2019   CO2 28 01/09/2019   GLUCOSE 105 (H) 01/09/2019   BUN 20 01/09/2019   CREATININE 0.84 01/09/2019   CALCIUM 9.8 01/09/2019   PROT 8.3 (H) 01/09/2019   ALBUMIN 2.9 (L) 01/09/2019   AST 20 01/09/2019   ALT 12 01/09/2019   ALKPHOS 98 01/09/2019   BILITOT 0.2 (L) 01/09/2019   GFRNONAA >60 01/09/2019   GFRAA >60 01/09/2019     Medications: I have reviewed the patient's current medications.   Assessment/Plan: .Metastatic non-small cell lung cancer  largeright pleural effusion, right lung mass, right pleural-based masses, left lung nodules  Right thoracentesis 09/04/2018-negative cytology  CT biopsy of right lateral pleural mass 09/07/2018-poorly differentiated non-small cell carcinoma, malignant cells positive for cytokeratin AE1/AE3 and cytokeratin 8/18, histology favoring adenocarcinoma, MSS, tumor mutation burden 5.no EGFR, ALK, BRAF alteration. PDL 1 rearrangement  PDL 1 tumor proportion score-100%   Cycle 1  Alimta/carboplatin 09/22/2018, pembrolizumab 09/28/2018  Cycle 2 Alimta/carboplatin/pembrolizumab 10/15/2018  Cycle 3 Alimta/carboplatin/pembrolizumab 11/06/2018  Cycle 4 Alimta/carboplatin/pembrolizumab 11/28/2018  CT chest 12/14/2018- response to therapy of lung primary/primaries, thoracic nodal and pulmonary metastasis.  Decrease in right pleural effusion with improvement in pleural-based metastasis.  New T10 heterogeneous sclerotic density likely due to healing of previously CT occult metastasis.  Pembrolizumab 12/19/2018  Pembrolizumab/Alimta starting 01/09/2019  2.Severe anemia-likely secondary to bleeding in the right pleural space and metastatic carcinoma, red cell transfusions 09/09/2018 09/10/2018, 09/23/2018-improved 3.Dyspnea secondary to the large right pleural effusion and anemia  Right Pleurx catheter placed 09/11/2018  Chest x-ray 10/09/2018-large right pleural effusion, loculated  Chest x-ray 11/06/2018- no interval change in the right-sided pleural effusion  Pleurx drainage catheter removed 11/13/2018 4.History of tobacco use 5.History of kidney stones 6. Fever-most likely tumor fever, improved 7. Admission 10/08/2018 with rapid atrial fibrillation/flutter-improved with diltiazem, started on Eliquis anticoagulation.  Eliquis discontinued.  Now on aspirin.     Disposition: Mr. Bollig continues to have improvement in his performance status.  He is tolerating the systemic therapy well.  I recommend resuming Alimta and continuing pembrolizumab.  He agrees. Mr. Heidinger will continue to increase his activity level as tolerated.  He will return for an office visit in 3 weeks.  Betsy Coder, MD  01/09/2019  9:16 AM

## 2019-01-10 ENCOUNTER — Telehealth: Payer: Self-pay | Admitting: Oncology

## 2019-01-10 NOTE — Telephone Encounter (Signed)
Scheduled appt per 5/27 los, no MD visit available on 6/17 scheduled appts on 6/18.  Tried calling the patient and was not able to reach the patient.

## 2019-01-17 ENCOUNTER — Other Ambulatory Visit: Payer: Medicare Other

## 2019-01-27 ENCOUNTER — Other Ambulatory Visit: Payer: Self-pay | Admitting: Oncology

## 2019-01-31 ENCOUNTER — Other Ambulatory Visit: Payer: Self-pay

## 2019-01-31 ENCOUNTER — Inpatient Hospital Stay (HOSPITAL_BASED_OUTPATIENT_CLINIC_OR_DEPARTMENT_OTHER): Payer: Medicare Other | Admitting: Nurse Practitioner

## 2019-01-31 ENCOUNTER — Inpatient Hospital Stay: Payer: Medicare Other | Attending: Oncology

## 2019-01-31 ENCOUNTER — Inpatient Hospital Stay: Payer: Medicare Other

## 2019-01-31 ENCOUNTER — Other Ambulatory Visit: Payer: Self-pay | Admitting: Nurse Practitioner

## 2019-01-31 ENCOUNTER — Ambulatory Visit (HOSPITAL_COMMUNITY)
Admission: RE | Admit: 2019-01-31 | Discharge: 2019-01-31 | Disposition: A | Discharge status: Home / Self Care | Payer: Medicare Other | Source: Ambulatory Visit | Attending: Nurse Practitioner | Admitting: Nurse Practitioner

## 2019-01-31 ENCOUNTER — Encounter: Payer: Self-pay | Admitting: Nurse Practitioner

## 2019-01-31 VITALS — BP 144/90 | HR 74

## 2019-01-31 VITALS — BP 122/46 | HR 84 | Temp 98.7°F | Resp 18 | Ht 73.0 in | Wt 147.8 lb

## 2019-01-31 DIAGNOSIS — C3491 Malignant neoplasm of unspecified part of right bronchus or lung: Secondary | ICD-10-CM

## 2019-01-31 DIAGNOSIS — R5383 Other fatigue: Secondary | ICD-10-CM

## 2019-01-31 DIAGNOSIS — Z5112 Encounter for antineoplastic immunotherapy: Secondary | ICD-10-CM | POA: Diagnosis not present

## 2019-01-31 DIAGNOSIS — Z7901 Long term (current) use of anticoagulants: Secondary | ICD-10-CM | POA: Insufficient documentation

## 2019-01-31 DIAGNOSIS — I4891 Unspecified atrial fibrillation: Secondary | ICD-10-CM | POA: Insufficient documentation

## 2019-01-31 DIAGNOSIS — Z79899 Other long term (current) drug therapy: Secondary | ICD-10-CM | POA: Diagnosis not present

## 2019-01-31 DIAGNOSIS — M25511 Pain in right shoulder: Secondary | ICD-10-CM | POA: Diagnosis not present

## 2019-01-31 DIAGNOSIS — C3490 Malignant neoplasm of unspecified part of unspecified bronchus or lung: Secondary | ICD-10-CM

## 2019-01-31 DIAGNOSIS — Z5111 Encounter for antineoplastic chemotherapy: Secondary | ICD-10-CM | POA: Insufficient documentation

## 2019-01-31 LAB — CBC WITH DIFFERENTIAL (CANCER CENTER ONLY)
Abs Immature Granulocytes: 0.03 10*3/uL (ref 0.00–0.07)
Basophils Absolute: 0 10*3/uL (ref 0.0–0.1)
Basophils Relative: 0 %
Eosinophils Absolute: 0.2 10*3/uL (ref 0.0–0.5)
Eosinophils Relative: 3 %
HCT: 32 % — ABNORMAL LOW (ref 39.0–52.0)
Hemoglobin: 9.8 g/dL — ABNORMAL LOW (ref 13.0–17.0)
Immature Granulocytes: 0 %
Lymphocytes Relative: 8 %
Lymphs Abs: 0.6 10*3/uL — ABNORMAL LOW (ref 0.7–4.0)
MCH: 28.7 pg (ref 26.0–34.0)
MCHC: 30.6 g/dL (ref 30.0–36.0)
MCV: 93.6 fL (ref 80.0–100.0)
Monocytes Absolute: 0.9 10*3/uL (ref 0.1–1.0)
Monocytes Relative: 13 %
Neutro Abs: 5.5 10*3/uL (ref 1.7–7.7)
Neutrophils Relative %: 76 %
Platelet Count: 345 10*3/uL (ref 150–400)
RBC: 3.42 MIL/uL — ABNORMAL LOW (ref 4.22–5.81)
RDW: 15.7 % — ABNORMAL HIGH (ref 11.5–15.5)
WBC Count: 7.3 10*3/uL (ref 4.0–10.5)
nRBC: 0 % (ref 0.0–0.2)

## 2019-01-31 LAB — CMP (CANCER CENTER ONLY)
ALT: 13 U/L (ref 0–44)
AST: 23 U/L (ref 15–41)
Albumin: 2.8 g/dL — ABNORMAL LOW (ref 3.5–5.0)
Alkaline Phosphatase: 96 U/L (ref 38–126)
Anion gap: 11 (ref 5–15)
BUN: 14 mg/dL (ref 8–23)
CO2: 28 mmol/L (ref 22–32)
Calcium: 9.6 mg/dL (ref 8.9–10.3)
Chloride: 100 mmol/L (ref 98–111)
Creatinine: 0.96 mg/dL (ref 0.61–1.24)
GFR, Est AFR Am: >60 mL/min (ref >60)
GFR, Estimated: >60 mL/min (ref >60)
Glucose, Bld: 134 mg/dL — ABNORMAL HIGH (ref 70–99)
Potassium: 4.1 mmol/L (ref 3.5–5.1)
Sodium: 139 mmol/L (ref 135–145)
Total Bilirubin: 0.2 mg/dL — ABNORMAL LOW (ref 0.3–1.2)
Total Protein: 8.2 g/dL — ABNORMAL HIGH (ref 6.5–8.1)

## 2019-01-31 LAB — TSH: TSH: 2.022 u[IU]/mL (ref 0.320–4.118)

## 2019-01-31 MED ORDER — PROCHLORPERAZINE MALEATE 10 MG PO TABS
ORAL_TABLET | ORAL | Status: AC
  Filled 2019-01-31: qty 1

## 2019-01-31 MED ORDER — SODIUM CHLORIDE 0.9 % IV SOLN
200.0000 mg | Freq: Once | INTRAVENOUS | Status: AC
  Administered 2019-01-31: 200 mg via INTRAVENOUS
  Filled 2019-01-31: qty 8

## 2019-01-31 MED ORDER — SODIUM CHLORIDE 0.9 % IV SOLN
Freq: Once | INTRAVENOUS | Status: AC
  Administered 2019-01-31: 15:00:00 via INTRAVENOUS
  Filled 2019-01-31: qty 250

## 2019-01-31 MED ORDER — SODIUM CHLORIDE 0.9 % IV SOLN
480.0000 mg/m2 | Freq: Once | INTRAVENOUS | Status: AC
  Administered 2019-01-31: 17:00:00 900 mg via INTRAVENOUS
  Filled 2019-01-31: qty 16

## 2019-01-31 MED ORDER — PROCHLORPERAZINE MALEATE 10 MG PO TABS
10.0000 mg | ORAL_TABLET | Freq: Once | ORAL | Status: AC
  Administered 2019-01-31: 16:00:00 10 mg via ORAL

## 2019-01-31 NOTE — Progress Notes (Addendum)
[facility] OFFICE PROGRESS NOTE   Diagnosis: Non-small cell lung cancer  INTERVAL HISTORY:   [name] returns as scheduled.  He completed a cycle of pembrolizumab/Alimta 01/09/2019.  He denies nausea/vomiting.  No mouth sores.  No diarrhea.  He again had a rash on his feet for a few days which has resolved.  No rash elsewhere.  He denies fever, cough, shortness of breath.  Main complaint today is progressive fatigue.  He also reports having a "stiff neck" 2 days after treatment, lasting for 4 days and subsequent right hand and shoulder pain which both resolved.  When he woke up this morning he again had right shoulder pain with certain movements.  He wonders if the way he is sleeping is causing the discomfort he is experiencing.  He exercises the upper extremities by lifting both arms above his head repeatedly.  Objective:  Vital signs in last 24 hours:  Blood pressure (!) 122/46, pulse 84, temperature 98.7 F (37.1 C), temperature source Oral, resp. rate 18, height _0  (1.854 m), weight 147 lb 12.8 oz (67 kg), SpO2 100 %.    HEENT: No thrush or ulcers. Lymphatics: No palpable right supraclavicular or axillary lymph nodes. Resp: Respirations even and unlabored. Cardio: Irregular.  Rate within normal limits. GI: Abdomen soft and nontender.  No hepatomegaly. Vascular: No leg edema. Neuro: Upper extremity motor strength 5/5. Musculoskeletal: No palpable abnormality at the right shoulder.  Good upper extremity range of motion.  Tender at the region of the acromioclavicular joint. Skin: No rash.   Lab Results:  Lab Results  Component Value Date   WBC 7.3 01/31/2019   HGB 9.8 (L) 01/31/2019   HCT 32.0 (L) 01/31/2019   MCV 93.6 01/31/2019   PLT 345 01/31/2019   NEUTROABS 5.5 01/31/2019    Imaging:  No results found.  Medications: I have reviewed the patient's current medications.  Assessment/Plan: 1.Metastatic non-small cell lung cancer  largeright  pleural effusion, right lung mass, right pleural-based masses, left lung nodules  Right thoracentesis 09/04/2018-negative cytology  CT biopsy of right lateral pleural mass 09/07/2018-poorly differentiated non-small cell carcinoma, malignant cells positive for cytokeratin AE1/AE3 and cytokeratin 8/18, histology favoring adenocarcinoma, MSS, tumor mutation burden 5.no EGFR, ALK, BRAF alteration. PDL 1 rearrangement  PDL 1 tumor proportion score-100%   Cycle 1 Alimta/carboplatin 09/22/2018, pembrolizumab 09/28/2018  Cycle 2 Alimta/carboplatin/pembrolizumab 10/15/2018  Cycle 3 Alimta/carboplatin/pembrolizumab 11/06/2018  Cycle4Alimta/carboplatin/pembrolizumab 11/28/2018  CT chest 12/14/2018- response to therapy of lung primary/primaries, thoracic nodal and pulmonary metastasis.  Decrease in right pleural effusion with improvement in pleural-based metastasis.  New T10 heterogeneous sclerotic density likely due to healing of previously CT occult metastasis.  Pembrolizumab 12/19/2018  Pembrolizumab/Alimta starting 01/09/2019  Pembrolizumab/Alimta 01/31/2019  2.Severe anemia-likely secondary to bleeding in the right pleural space and metastatic carcinoma, red cell transfusions 09/09/2018 09/10/2018, 09/23/2018-improved 3.Dyspnea secondary to the large right pleural effusion and anemia  Right Pleurx catheter placed 09/11/2018  Chest x-ray 10/09/2018-large right pleural effusion, loculated  Chest x-ray 11/06/2018- no interval change in the right-sided pleural effusion  Pleurx drainage catheter removed 11/13/2018 4.History of tobacco use 5.History of kidney stones 6. Fever-most likely tumor fever, improved 7. Admission 10/08/2018 with rapid atrial fibrillation/flutter-improved with diltiazem, started on Eliquis anticoagulation.  Eliquis discontinued.  Now on aspirin.  Disposition: Brian Ramirez appears stable.  Plan to continue pembrolizumab/Alimta every 3 weeks, treatment today.  We reviewed the  CBC and chemistry panel from today, adequate for treatment.  He is complaining of increased fatigue.  TSH from today in normal range.  We will add a testosterone level to his labs.  I referred him for an x-ray of the right shoulder.  He understands to contact the office if the pain persists, worsens or he develops other symptoms.  He will try Tylenol.  He will return for lab, follow-up and Pembrolizumab/Alimta in 3 weeks.  He will contact the office in the interim as outlined above or with any other problems.    Kerrigan Glendening ANP/GNP-BC   01/31/2019  3:54 PM

## 2019-01-31 NOTE — Addendum Note (Signed)
Addended by: Owens Shark on: 01/31/2019 03:55 PM   Modules accepted: Orders

## 2019-01-31 NOTE — Patient Instructions (Signed)
[facility] Discharge Instructions for Patients Receiving Chemotherapy  Today you received the following chemotherapy agents Pembrolizumab (KEYTRUDA) & Pemetrexed (ALIMTA).  To help prevent nausea and vomiting after your treatment, we encourage you to take your nausea medication as prescribed.   If you develop nausea and vomiting that is not controlled by your nausea medication, call the clinic.   BELOW ARE SYMPTOMS THAT SHOULD BE REPORTED IMMEDIATELY:  *FEVER GREATER THAN 100.5 F  *CHILLS WITH OR WITHOUT FEVER  NAUSEA AND VOMITING THAT IS NOT CONTROLLED WITH YOUR NAUSEA MEDICATION  *UNUSUAL SHORTNESS OF BREATH  *UNUSUAL BRUISING OR BLEEDING  TENDERNESS IN MOUTH AND THROAT WITH OR WITHOUT PRESENCE OF ULCERS  *URINARY PROBLEMS  *BOWEL PROBLEMS  UNUSUAL RASH Items with * indicate a potential emergency and should be followed up as soon as possible.  Feel free to call the clinic should you have any questions or concerns. The clinic phone number is [phone].  Please show the [facility] at check-in to the Emergency Department and triage nurse.  Coronavirus (COVID-19) Are you at risk?  Are you at risk for the Coronavirus (COVID-19)?  To be considered HIGH RISK for Coronavirus (COVID-19), you have to meet the following criteria:  . Traveled to [place], [place], [place], [place] or [place]; or in the [place] to [place], [place], [place], or [place]; and have fever, cough, and shortness of breath within the last 2 weeks of travel OR . Been in close contact with a person diagnosed with COVID-19 within the last 2 weeks and have fever, cough, and shortness of breath . IF YOU DO NOT MEET THESE CRITERIA, YOU ARE CONSIDERED LOW RISK FOR COVID-19.  What to do if you are HIGH RISK for COVID-19?  Marland Kitchen If you are having a medical emergency, call 911. . Seek medical care right away. Before you go to a doctor's office, urgent care or emergency department,  call ahead and tell them about your recent travel, contact with someone diagnosed with COVID-19, and your symptoms. You should receive instructions from your physician's office regarding next steps of care.  . When you arrive at healthcare provider, tell the healthcare staff immediately you have returned from visiting [place], [place], [place], [place] or [place]; or traveled in the [place] to [place], [place], [place], or [place]; in the last two weeks or you have been in close contact with a person diagnosed with COVID-19 in the last 2 weeks.   . Tell the health care staff about your symptoms: fever, cough and shortness of breath. . After you have been seen by a medical provider, you will be either: o Tested for (COVID-19) and discharged home on quarantine except to seek medical care if symptoms worsen, and asked to  - Stay home and avoid contact with others until you get your results (4-5 days)  - Avoid travel on public transportation if possible (such as bus, train, or airplane) or o Sent to the Emergency Department by EMS for evaluation, COVID-19 testing, and possible admission depending on your condition and test results.  What to do if you are LOW RISK for COVID-19?  Reduce your risk of any infection by using the same precautions used for avoiding the common cold or flu:  Marland Kitchen Wash your hands often with soap and warm water for at least 20 seconds.  If soap and water are not readily available, use an alcohol-based hand sanitizer with at least 60% alcohol.  Marland Kitchen  If coughing or sneezing, cover your mouth and nose by coughing or sneezing into the elbow areas of your shirt or coat, into a tissue or into your sleeve (not your hands). . Avoid shaking hands with others and consider head nods or verbal greetings only. . Avoid touching your eyes, nose, or mouth with unwashed hands.  . Avoid close contact with people who are sick. . Avoid places or events with large numbers of people in one  location, like concerts or sporting events. . Carefully consider travel plans you have or are making. . If you are planning any travel outside or inside the [place], visit the CDC's Travelers' Health webpage for the latest health notices. . If you have some symptoms but not all symptoms, continue to monitor at home and seek medical attention if your symptoms worsen. . If you are having a medical emergency, call 911.   [place] / e-Visit: [email or web address]         [facility]: [facility]: [phone]                   [facility]: [phone]

## 2019-02-01 ENCOUNTER — Telehealth: Payer: Self-pay

## 2019-02-01 LAB — TESTOSTERONE: Testosterone: 376 ng/dL (ref 264–916)

## 2019-02-01 NOTE — Telephone Encounter (Signed)
Spoke with pt advised per Ned Card NP right shoulder x-ray was negative. Pt verbalized understanding.

## 2019-02-04 ENCOUNTER — Telehealth: Payer: Self-pay | Admitting: Nurse Practitioner

## 2019-02-04 NOTE — Telephone Encounter (Signed)
Called and spoke with patient. Confirmed 7/9 appts

## 2019-02-17 ENCOUNTER — Other Ambulatory Visit: Payer: Self-pay | Admitting: Oncology

## 2019-02-21 ENCOUNTER — Telehealth: Payer: Self-pay

## 2019-02-21 ENCOUNTER — Other Ambulatory Visit: Payer: Medicare Other

## 2019-02-21 ENCOUNTER — Inpatient Hospital Stay: Payer: Medicare Other | Attending: Oncology | Admitting: Nurse Practitioner

## 2019-02-21 ENCOUNTER — Encounter: Payer: Self-pay | Admitting: Nurse Practitioner

## 2019-02-21 ENCOUNTER — Inpatient Hospital Stay: Payer: Medicare Other

## 2019-02-21 ENCOUNTER — Other Ambulatory Visit: Payer: Self-pay

## 2019-02-21 ENCOUNTER — Other Ambulatory Visit: Payer: Self-pay | Admitting: *Deleted

## 2019-02-21 VITALS — BP 135/85 | HR 88 | Temp 98.2°F | Resp 18 | Ht 73.0 in | Wt 147.9 lb

## 2019-02-21 DIAGNOSIS — I4891 Unspecified atrial fibrillation: Secondary | ICD-10-CM | POA: Diagnosis not present

## 2019-02-21 DIAGNOSIS — Z7901 Long term (current) use of anticoagulants: Secondary | ICD-10-CM | POA: Insufficient documentation

## 2019-02-21 DIAGNOSIS — C3491 Malignant neoplasm of unspecified part of right bronchus or lung: Secondary | ICD-10-CM | POA: Insufficient documentation

## 2019-02-21 DIAGNOSIS — C3490 Malignant neoplasm of unspecified part of unspecified bronchus or lung: Secondary | ICD-10-CM

## 2019-02-21 DIAGNOSIS — R5383 Other fatigue: Secondary | ICD-10-CM

## 2019-02-21 DIAGNOSIS — Z5111 Encounter for antineoplastic chemotherapy: Secondary | ICD-10-CM | POA: Insufficient documentation

## 2019-02-21 DIAGNOSIS — Z5112 Encounter for antineoplastic immunotherapy: Secondary | ICD-10-CM | POA: Diagnosis not present

## 2019-02-21 DIAGNOSIS — Z79899 Other long term (current) drug therapy: Secondary | ICD-10-CM | POA: Insufficient documentation

## 2019-02-21 DIAGNOSIS — C7971 Secondary malignant neoplasm of right adrenal gland: Secondary | ICD-10-CM | POA: Diagnosis not present

## 2019-02-21 DIAGNOSIS — R609 Edema, unspecified: Secondary | ICD-10-CM | POA: Insufficient documentation

## 2019-02-21 LAB — CBC WITH DIFFERENTIAL (CANCER CENTER ONLY)
Abs Immature Granulocytes: 0.02 10*3/uL (ref 0.00–0.07)
Basophils Absolute: 0 10*3/uL (ref 0.0–0.1)
Basophils Relative: 0 %
Eosinophils Absolute: 0.2 10*3/uL (ref 0.0–0.5)
Eosinophils Relative: 3 %
HCT: 30.6 % — ABNORMAL LOW (ref 39.0–52.0)
Hemoglobin: 9.4 g/dL — ABNORMAL LOW (ref 13.0–17.0)
Immature Granulocytes: 0 %
Lymphocytes Relative: 9 %
Lymphs Abs: 0.6 10*3/uL — ABNORMAL LOW (ref 0.7–4.0)
MCH: 28.7 pg (ref 26.0–34.0)
MCHC: 30.7 g/dL (ref 30.0–36.0)
MCV: 93.6 fL (ref 80.0–100.0)
Monocytes Absolute: 1.1 10*3/uL — ABNORMAL HIGH (ref 0.1–1.0)
Monocytes Relative: 17 %
Neutro Abs: 4.3 10*3/uL (ref 1.7–7.7)
Neutrophils Relative %: 71 %
Platelet Count: 326 10*3/uL (ref 150–400)
RBC: 3.27 MIL/uL — ABNORMAL LOW (ref 4.22–5.81)
RDW: 15.9 % — ABNORMAL HIGH (ref 11.5–15.5)
WBC Count: 6.1 10*3/uL (ref 4.0–10.5)
nRBC: 0 % (ref 0.0–0.2)

## 2019-02-21 LAB — CMP (CANCER CENTER ONLY)
ALT: 18 U/L (ref 0–44)
AST: 27 U/L (ref 15–41)
Albumin: 2.6 g/dL — ABNORMAL LOW (ref 3.5–5.0)
Alkaline Phosphatase: 96 U/L (ref 38–126)
Anion gap: 10 (ref 5–15)
BUN: 16 mg/dL (ref 8–23)
CO2: 28 mmol/L (ref 22–32)
Calcium: 9.4 mg/dL (ref 8.9–10.3)
Chloride: 101 mmol/L (ref 98–111)
Creatinine: 0.96 mg/dL (ref 0.61–1.24)
GFR, Est AFR Am: >60 mL/min (ref >60)
GFR, Estimated: >60 mL/min (ref >60)
Glucose, Bld: 135 mg/dL — ABNORMAL HIGH (ref 70–99)
Potassium: 4.2 mmol/L (ref 3.5–5.1)
Sodium: 139 mmol/L (ref 135–145)
Total Bilirubin: 0.2 mg/dL — ABNORMAL LOW (ref 0.3–1.2)
Total Protein: 7.9 g/dL (ref 6.5–8.1)

## 2019-02-21 LAB — TSH: TSH: 2.497 u[IU]/mL (ref 0.320–4.118)

## 2019-02-21 LAB — CORTISOL: Cortisol, Plasma: 18.7 ug/dL

## 2019-02-21 MED ORDER — CYANOCOBALAMIN 1000 MCG/ML IJ SOLN
1000.0000 ug | Freq: Once | INTRAMUSCULAR | Status: AC
  Administered 2019-02-21: 1000 ug via INTRAMUSCULAR

## 2019-02-21 MED ORDER — SODIUM CHLORIDE 0.9 % IV SOLN
480.0000 mg/m2 | Freq: Once | INTRAVENOUS | Status: AC
  Administered 2019-02-21: 900 mg via INTRAVENOUS
  Filled 2019-02-21: qty 20

## 2019-02-21 MED ORDER — PROCHLORPERAZINE MALEATE 10 MG PO TABS
ORAL_TABLET | ORAL | Status: AC
  Filled 2019-02-21: qty 1

## 2019-02-21 MED ORDER — SODIUM CHLORIDE 0.9 % IV SOLN
200.0000 mg | Freq: Once | INTRAVENOUS | Status: AC
  Administered 2019-02-21: 200 mg via INTRAVENOUS
  Filled 2019-02-21: qty 8

## 2019-02-21 MED ORDER — SODIUM CHLORIDE 0.9 % IV SOLN
Freq: Once | INTRAVENOUS | Status: AC
  Administered 2019-02-21: 15:00:00 via INTRAVENOUS
  Filled 2019-02-21: qty 250

## 2019-02-21 MED ORDER — CYANOCOBALAMIN 1000 MCG/ML IJ SOLN
INTRAMUSCULAR | Status: AC
  Filled 2019-02-21: qty 1

## 2019-02-21 MED ORDER — PROCHLORPERAZINE MALEATE 10 MG PO TABS
10.0000 mg | ORAL_TABLET | Freq: Once | ORAL | Status: AC
  Administered 2019-02-21: 10 mg via ORAL

## 2019-02-21 NOTE — Progress Notes (Addendum)
[facility] OFFICE PROGRESS NOTE   Diagnosis: Non-small cell lung cancer  INTERVAL HISTORY:   [name] returns as scheduled.  He completed another cycle of pembrolizumab/Alimta 01/31/2019.  He had dry heaves earlier this week.  No nausea or vomiting following the chemotherapy.  No mouth sores.  No diarrhea.  No rash.  He has intermittent leg swelling.  The edema improves overnight.  He describes his appetite is "okay".  No fever, cough, shortness of breath.  He is no longer having right shoulder pain.  His main complaint is continued fatigue.  Objective:  Vital signs in last 24 hours:  Blood pressure 135/85, pulse 88, temperature 98.2 F (36.8 C), temperature source Temporal, resp. rate 18, height 6' 1" (1.854 m), weight 147 lb 14.4 oz (67.1 kg), SpO2 99 %.    Resp: Lungs are clear.  Breath sounds are diminished throughout the right lung field.  No respiratory distress. Cardio: Irregular. GI: No hepatomegaly. Vascular: No leg edema. Neuro: Alert and oriented. Skin: No rash.   Lab Results:  Lab Results  Component Value Date   WBC 6.1 02/21/2019   HGB 9.4 (L) 02/21/2019   HCT 30.6 (L) 02/21/2019   MCV 93.6 02/21/2019   PLT 326 02/21/2019   NEUTROABS 4.3 02/21/2019    Imaging:  No results found.  Medications: I have reviewed the patient's current medications.  Assessment/Plan: 1.Metastatic non-small cell lung cancer  largeright pleural effusion, right lung mass, right pleural-based masses, left lung nodules  Right thoracentesis 09/04/2018-negative cytology  CT biopsy of right lateral pleural mass 09/07/2018-poorly differentiated non-small cell carcinoma, malignant cells positive for cytokeratin AE1/AE3 and cytokeratin 8/18, histology favoring adenocarcinoma, MSS, tumor mutation burden 5.no EGFR, ALK, BRAF alteration. PDL 1 rearrangement  PDL 1 tumor proportion score-100%   Cycle 1 Alimta/carboplatin 09/22/2018, pembrolizumab 09/28/2018  Cycle 2  Alimta/carboplatin/pembrolizumab 10/15/2018  Cycle 3 Alimta/carboplatin/pembrolizumab 11/06/2018  Cycle4Alimta/carboplatin/pembrolizumab 11/28/2018  CT chest 12/14/2018-response to therapy of lung primary/primaries, thoracic nodal and pulmonary metastasis. Decrease in right pleural effusion with improvement in pleural-based metastasis. New T10 heterogeneous sclerotic density likely due to healing of previously CT occult metastasis.  Pembrolizumab 12/19/2018  Pembrolizumab/Alimta starting 01/09/2019  Pembrolizumab/Alimta 01/31/2019  Pembrolizumab/Alimta 02/21/2019  2.Severe anemia-likely secondary to bleeding in the right pleural space and metastatic carcinoma, red cell transfusions 09/09/2018 09/10/2018, 09/23/2018-improved 3.Dyspnea secondary to the large right pleural effusion and anemia  Right Pleurx catheter placed 09/11/2018  Chest x-ray 10/09/2018-large right pleural effusion, loculated  Chest x-ray 11/06/2018-no interval change in the right-sided pleural effusion  Pleurx drainage catheter removed 11/13/2018 4.History of tobacco use 5.History of kidney stones 6. Fever-most likely tumor fever, improved 7. Admission 10/08/2018 with rapid atrial fibrillation/flutter-improved with diltiazem, started on Eliquis anticoagulation. Eliquis discontinued. Now on aspirin.   Disposition: Mr. Bergeman appears stable.  There is no clinical evidence of disease progression.  The plan is to continue pembrolizumab/Alimta every 3 weeks.  He will undergo a restaging chest CT in August.  We reviewed the labs from today, adequate for treatment.  He has continued significant fatigue.  We will check a cortisol level.  He will return for lab, follow-up, Alimta and pembrolizumab in 3 weeks.  He will contact the office in the interim with any problems.   Patient seen with Dr. Sherrill.   Lisa Dracen ANP/GNP-BC   02/21/2019  2:43 PM This was a shared visit with Lisa Honor.  Mr. Nou  appears stable.  He has malaise, but his performance status remains much improved compared to   when he was diagnosed with lung cancer.  The plan is to continue Alimta/pembrolizumab.  Brad Sherrill, MD       

## 2019-02-21 NOTE — Telephone Encounter (Signed)
Per Lattie Haw called lab and spoke with Verdis Frederickson to see if they could add a cortisol level to patients labs. Verdis Frederickson said that they could and its being added now

## 2019-02-21 NOTE — Patient Instructions (Signed)
[facility] Discharge Instructions for Patients Receiving Chemotherapy  Today you received the following chemotherapy agents Keytruda; Alimta  To help prevent nausea and vomiting after your treatment, we encourage you to take your nausea medication as directed   If you develop nausea and vomiting that is not controlled by your nausea medication, call the clinic.   BELOW ARE SYMPTOMS THAT SHOULD BE REPORTED IMMEDIATELY:  *FEVER GREATER THAN 100.5 F  *CHILLS WITH OR WITHOUT FEVER  NAUSEA AND VOMITING THAT IS NOT CONTROLLED WITH YOUR NAUSEA MEDICATION  *UNUSUAL SHORTNESS OF BREATH  *UNUSUAL BRUISING OR BLEEDING  TENDERNESS IN MOUTH AND THROAT WITH OR WITHOUT PRESENCE OF ULCERS  *URINARY PROBLEMS  *BOWEL PROBLEMS  UNUSUAL RASH Items with * indicate a potential emergency and should be followed up as soon as possible.  Feel free to call the clinic should you have any questions or concerns. The clinic phone number is [phone].  Please show the CHEMO ALERT CARD at check-in to the Emergency Department and triage nurse.   

## 2019-02-22 ENCOUNTER — Telehealth: Payer: Self-pay | Admitting: Nurse Practitioner

## 2019-02-22 NOTE — Telephone Encounter (Signed)
Called and left msg for patient about appt

## 2019-03-05 NOTE — Progress Notes (Signed)
Virtual Visit via Video Note   This visit type was conducted due to national recommendations for restrictions regarding the COVID-19 Pandemic (e.g. social distancing) in an effort to limit this patient's exposure and mitigate transmission in our community.  Due to his co-morbid illnesses, this patient is at least at moderate risk for complications without adequate follow up.  This format is felt to be most appropriate for this patient at this time.  All issues noted in this document were discussed and addressed.  A limited physical exam was performed with this format.  Please refer to the patient's chart for his consent to telehealth for [facility].   Evaluation Performed:  Follow-up visit  Date:  03/07/2019   ID:  Brian Ramirez, Brian Ramirez 08-28-1945, MRN [id number]  Patient Location: Home Provider Location: Home  PCP:  [name], MD  Cardiologist:  [name], MD   Reason for visit 3 months follow up  History of Present Illness:    Brian Ramirez is a 73 y.o. male with with history of metastatic non-small cell lung CA, right pleural effusion status post Pleurx catheter, anemia was found to have new onset atrial flutter with RVR during blood transfusion for anemia 10/11/2018.  2D echo normal LVEF 55% left atrium was moderately dilated.  Patient was placed on diltiazem and amiodarone initially with plans not to use long-term because of lung cancer. The plan was to d/c amiodarone at the follow up visit if he remains in SR.  Discharged on Eliquis.  ChadsVasc equals 1 but with his cancer he may be at increased risk of stroke. He did have a bloody pleural effusion.  If he continues to bleed recommend aspirin 81 mg daily.  Lower extremity edema improved with Lasix.  He was seen by [name] on 10/14/2018, he was in normal sinus rhythm.  Dr. [name] decreased his Lasix to once a day and he has had no further swelling.  12/03/2018 - 3 months follow up, he continues with  chemotherapy - feels tired after it but otherwise feels very well, improved energy and SOB. He has significant nosebleeds- on home O2 and nose bleeds with every blowing. No CP, palpitations, dizziness, no DOE.   03/07/2019 - 3 months follow up, the patient continues to be treated for metastatic non-small cell lung cancer with largeright pleural effusion, right lung mass, right pleural-based masses, left lung nodules. Chest CT 12/14/2018 showed response to therapy of lung primary/primaries, thoracic nodal and pulmonary metastasis. Decrease in right pleural effusion with improvement in pleural-based metastasis. New T10 heterogeneous sclerotic density likely due to healing of previously CT occult metastasis. He is on therapy with Pembrolizumab. He has severe anemia-likely secondary to bleeding in the right pleural space and metastatic carcinoma, red cell transfusions 09/09/2018 09/10/2018, 09/23/2018-improved, most recent Hb 9.4 on 02/21/2019.  He is having on and off lower extremity edema only in his left leg that is chronic and goes away by the morning.  He feels occasional palpitations with regards to extra beats.  No longer palpitations no dizziness or syncope.  No chest pain.  The patient does not have symptoms concerning for COVID-19 infection (fever, chills, cough, or new shortness of breath).   Past Medical History:  Diagnosis Date  . Acute congestive heart failure ([place])   . Anemia of chronic disease 10/08/2018  . Anticoagulated   . Atrial flutter with rapid ventricular response ([place]) 10/08/2018  . Difficult airway for intubation 09/26/2016  . Edema of both lower extremities 10/09/2018  .  Goals of care, counseling/discussion 09/20/2018  . Hernia 2012  . History of hiatal hernia   . History of kidney stones   . Left inguinal hernia s/p lap repair 11/22/2016 09/26/2016  . Lung cancer ([place]) 09/20/2018  . Lung mass 09/04/2018  . Malnutrition of moderate degree 09/20/2018  . Mass of right lung 09/04/2018  .  New onset atrial fibrillation ([place])   . Personal history of kidney stones 1992  . Pleural effusion 09/04/2018  . Pleural effusion on right   . Pressure injury of skin 09/20/2018  . SOB (shortness of breath) 09/19/2018   Past Surgical History:  Procedure Laterality Date  . HERNIA REPAIR  2012   inguinal  . INGUINAL HERNIA REPAIR Left 11/22/2016   Procedure: LAPAROSCOPIC  REPAIR OF LEFT INGUINAL HERNIA WITH MESH;  Surgeon: [name], MD;  Location: [facility];  Service: General;  Laterality: Left;  . IR INSTILL VIA CHEST TUBE AGENT FOR FIBRINOLYSIS INI DAY  10/10/2018  . IR INSTILL VIA CHEST TUBE AGENT FOR FIBRINOLYSIS INI DAY  10/31/2018  . IR PERC PLEURAL DRAIN W/INDWELL CATH W/IMG GUIDE  09/11/2018  . IR REMOVAL OF PLURAL CATH W/CUFF  11/13/2018     Current Meds  Medication Sig  . aspirin EC 81 MG tablet Take 1 tablet (81 mg total) by mouth daily.  Marland Kitchen diltiazem (CARDIZEM CD) 120 MG 24 hr capsule Take 1 capsule (120 mg total) by mouth daily.  Marland Kitchen docusate sodium (COLACE) 100 MG capsule Take 100 mg by mouth 2 (two) times daily.  . folic acid (FOLVITE) 1 MG tablet Take 1 mg by mouth daily.  . Nutritional Supplements (NUTRITIONAL DRINK PO) Take 1 Bottle by mouth 2 (two) times a day.      Allergies:   Patient has no known allergies.   Social History   Tobacco Use  . Smoking status: Never Smoker  . Smokeless tobacco: Never Used  Substance Use Topics  . Alcohol use: Yes    Alcohol/week: 1.0 standard drinks    Types: 1 Glasses of wine per week  . Drug use: No     Family Hx: The patient's family history includes Cancer in his brother; Heart disease in his father.  ROS:   Please see the history of present illness.    All other systems reviewed and are negative.   Prior CV studies:   The following studies were reviewed today:  TTE 10/09/2018  Echo 10/09/2018 1. The left ventricle has a visually estimated ejection fraction of of 55%. The cavity size was normal. Left ventricular diastolic  parameters were normal No evidence of left ventricular regional wall motion abnormalities. 2. The right ventricle has normal systolic function. The cavity was normal. There is no increase in right ventricular wall thickness. 3. Left atrial size was moderately dilated. 4. The tricuspid valve is normal in structure. 5. The aortic valve is tricuspid Mild calcification of the aortic valve. no stenosis of the aortic valve. 6. The pulmonic valve was normal in structure. 7. The aortic root and ascending aorta are normal in size and structure. 8. The mitral valve is normal in structure. No evidence of mitral valve stenosis. Trivial mitral regurgitation. 9. Normal IVC size. PA systolic pressure 37 mmHg.  Labs/Other Tests and Data Reviewed:    EKG:  No ECG reviewed.  Recent Labs: 10/10/2018: B Natriuretic Peptide 360.9 10/11/2018: Magnesium 1.7 02/21/2019: ALT 18; BUN 16; Creatinine 0.96; Hemoglobin 9.4; Platelet Count 326; Potassium 4.2; Sodium 139; TSH 2.497   Recent  Lipid Panel No results found for: CHOL, TRIG, HDL, CHOLHDL, LDLCALC, LDLDIRECT  Wt Readings from Last 3 Encounters:  03/07/19 150 lb (68 kg)  02/21/19 147 lb 14.4 oz (67.1 kg)  01/31/19 147 lb 12.8 oz (67 kg)     Objective:    Vital Signs:  Wt 150 lb (68 kg)   BMI 19.79 kg/m    VITAL SIGNS:  reviewed     ASSESSMENT & PLAN:    New onset atrial fibrillation in the setting of anemia during blood transfusion treated with amiodarone and diltiazem. Off amiodarone, remains in SR, regular HR in 80', on Cardizem to 120 mg po daily. CHADS-VASc 1, we will d/c eliquis once he finishes this month and start ASA 81 mg po daily.  We will continue the same management, he had an EKG done at his cancer center on January 31, 2019 and he was in sinus rhythm.  Chronic diastolic CHF -occasional lower extremity edema, we will prescribe him Lasix 20 mg daily p.o. as needed.  We will check echo now as he is on chemotherapy.  Anemia - stable  Hb 9.4 on 02/21/19  Metastatic non-small cell lung CA followed by oncology, plan as described in HPI.   COVID-19 Education: The signs and symptoms of COVID-19 were discussed with the patient and how to seek care for testing (follow up with PCP or arrange E-visit).  The importance of social distancing was discussed today.  Time:   Today, I have spent 20 minutes with the patient with telehealth technology discussing the above problems.     Medication Adjustments/Labs and Tests Ordered: Current medicines are reviewed at length with the patient today.  Concerns regarding medicines are outlined above.   Tests Ordered: No orders of the defined types were placed in this encounter.   Medication Changes: No orders of the defined types were placed in this encounter.   Disposition:  Follow up in 3 month(s)  Signed, [name], MD  03/07/2019 9:28 AM    [facility]

## 2019-03-07 ENCOUNTER — Other Ambulatory Visit: Payer: Self-pay

## 2019-03-07 ENCOUNTER — Telehealth (INDEPENDENT_AMBULATORY_CARE_PROVIDER_SITE_OTHER): Payer: Medicare Other | Admitting: Cardiology

## 2019-03-07 ENCOUNTER — Encounter: Payer: Self-pay | Admitting: *Deleted

## 2019-03-07 ENCOUNTER — Encounter: Payer: Self-pay | Admitting: Cardiology

## 2019-03-07 VITALS — Wt 150.0 lb

## 2019-03-07 DIAGNOSIS — I5031 Acute diastolic (congestive) heart failure: Secondary | ICD-10-CM

## 2019-03-07 DIAGNOSIS — I480 Paroxysmal atrial fibrillation: Secondary | ICD-10-CM

## 2019-03-07 DIAGNOSIS — Z5111 Encounter for antineoplastic chemotherapy: Secondary | ICD-10-CM

## 2019-03-07 DIAGNOSIS — R600 Localized edema: Secondary | ICD-10-CM

## 2019-03-07 DIAGNOSIS — R918 Other nonspecific abnormal finding of lung field: Secondary | ICD-10-CM

## 2019-03-07 MED ORDER — FUROSEMIDE 20 MG PO TABS: 20.0000 mg | ORAL_TABLET | Freq: Every day | ORAL | 4 refills | Status: DC | PRN

## 2019-03-07 NOTE — Patient Instructions (Addendum)
Medication Instructions:   START TAKING LASIX 20 MG BY MOUTH DAILY ONLY AS NEEDED FOR LOWER EXTREMITY SWELLING.  If you need a refill on your cardiac medications before your next appointment, please call your pharmacy.     Testing/Procedures:  Your physician has requested that you have an echocardiogram. Echocardiography is a painless test that uses sound waves to create images of your heart. It provides your doctor with information about the size and shape of your heart and how well your heart's chambers and valves are working. This procedure takes approximately one hour. There are no restrictions for this procedure. DR Meda Coffee WOULD LIKE FOR YOU TO GET YOUR ECHO DONE NOW--WE WILL DO AN ECHO WITH STRAIN.  OUR ECHO SCHEDULER WILL CALL YOU SHORTLY TO ARRANGE THIS APPOINTMENT.    Follow-Up:  WITH DR Meda Coffee AS ANOTHER VIRTUAL VIDEO VISIT ON June 19, 2019 AT 9:20 AM-

## 2019-03-07 NOTE — Addendum Note (Signed)
Addended by: Nuala Alpha on: 03/07/2019 09:49 AM   Modules accepted: Orders

## 2019-03-08 ENCOUNTER — Telehealth: Payer: Self-pay | Admitting: *Deleted

## 2019-03-08 NOTE — Telephone Encounter (Signed)
Pts echo is scheduled for 7/27 at 2:50 pm.

## 2019-03-08 NOTE — Telephone Encounter (Signed)
-----   Message from Marcine Matar sent at 03/08/2019  9:21 AM EDT ----- Regarding: RE: PT NEEDS ECHO SCHEDULED NOW PER DR NELSON 7.23.20 @ 10:24am lm on home vm - gesila ----- Message ----- From: Nuala Alpha, LPN Sent: 2/59/5638   4:20 PM EDT To: Marcine Matar, [facility] Subject: FW: PT NEEDS ECHO SCHEDULED NOW PER DR Meda Coffee   ----- Message ----- From: Nuala Alpha, LPN Sent: 7/56/4332   9:46 AM EDT To: Nuala Alpha, LPN, [facility] Subject: PT NEEDS ECHO SCHEDULED NOW PER DR NELSON      This pt saw Meda Coffee this morning for a virtual visit and she ordered for him to get an echo scheduled NOW or asap for he is on chemotherapy.  He will need echo with strain. Order is in the system and the pt is aware that you will call him today to have this arranged. Can you please schedule?  Thanks, [employer]

## 2019-03-10 ENCOUNTER — Other Ambulatory Visit: Payer: Self-pay | Admitting: Oncology

## 2019-03-11 ENCOUNTER — Other Ambulatory Visit: Payer: Self-pay

## 2019-03-11 ENCOUNTER — Ambulatory Visit (HOSPITAL_COMMUNITY): Payer: Medicare Other | Attending: Cardiology

## 2019-03-11 DIAGNOSIS — I5031 Acute diastolic (congestive) heart failure: Secondary | ICD-10-CM | POA: Insufficient documentation

## 2019-03-11 DIAGNOSIS — R918 Other nonspecific abnormal finding of lung field: Secondary | ICD-10-CM | POA: Insufficient documentation

## 2019-03-11 DIAGNOSIS — I480 Paroxysmal atrial fibrillation: Secondary | ICD-10-CM | POA: Diagnosis not present

## 2019-03-11 DIAGNOSIS — R600 Localized edema: Secondary | ICD-10-CM | POA: Diagnosis not present

## 2019-03-11 DIAGNOSIS — Z5111 Encounter for antineoplastic chemotherapy: Secondary | ICD-10-CM

## 2019-03-13 ENCOUNTER — Telehealth: Payer: Self-pay | Admitting: Cardiology

## 2019-03-13 NOTE — Telephone Encounter (Signed)
Correction: error in sending to send to [place], LPN.

## 2019-03-13 NOTE — Telephone Encounter (Signed)
Pt has been notified of echo results by phone with verbal understanding. Pt wanted to know if there was any mention about him being a-fib. I stated I did not see anything about that. I assured the pt once Dr. Meda Coffee reviews the echo we will call with any recommendations she may have. Pt thanked me for the call. The patient has been notified of the result and verbalized understanding.  All questions (if any) were answered. Julaine Hua, [facility] 03/13/2019 5:17 PM

## 2019-03-13 NOTE — Telephone Encounter (Signed)
New message   Patient is returning call for echo results. Please call. 

## 2019-03-13 NOTE — Telephone Encounter (Signed)
I returned pt's call and got his vm, left message. I will try and reach pt later today.

## 2019-03-14 ENCOUNTER — Inpatient Hospital Stay: Payer: Medicare Other

## 2019-03-14 ENCOUNTER — Other Ambulatory Visit: Payer: Self-pay

## 2019-03-14 ENCOUNTER — Telehealth: Payer: Self-pay | Admitting: Oncology

## 2019-03-14 ENCOUNTER — Inpatient Hospital Stay (HOSPITAL_BASED_OUTPATIENT_CLINIC_OR_DEPARTMENT_OTHER): Payer: Medicare Other | Admitting: Oncology

## 2019-03-14 VITALS — BP 140/81 | HR 88 | Temp 98.7°F | Resp 19 | Ht 73.0 in | Wt 144.3 lb

## 2019-03-14 DIAGNOSIS — Z79899 Other long term (current) drug therapy: Secondary | ICD-10-CM | POA: Diagnosis not present

## 2019-03-14 DIAGNOSIS — Z9981 Dependence on supplemental oxygen: Secondary | ICD-10-CM | POA: Diagnosis not present

## 2019-03-14 DIAGNOSIS — R609 Edema, unspecified: Secondary | ICD-10-CM | POA: Diagnosis not present

## 2019-03-14 DIAGNOSIS — I4891 Unspecified atrial fibrillation: Secondary | ICD-10-CM

## 2019-03-14 DIAGNOSIS — C3490 Malignant neoplasm of unspecified part of unspecified bronchus or lung: Secondary | ICD-10-CM

## 2019-03-14 DIAGNOSIS — R5383 Other fatigue: Secondary | ICD-10-CM | POA: Diagnosis not present

## 2019-03-14 DIAGNOSIS — C3491 Malignant neoplasm of unspecified part of right bronchus or lung: Secondary | ICD-10-CM | POA: Diagnosis not present

## 2019-03-14 DIAGNOSIS — Z5111 Encounter for antineoplastic chemotherapy: Secondary | ICD-10-CM | POA: Diagnosis not present

## 2019-03-14 DIAGNOSIS — C7971 Secondary malignant neoplasm of right adrenal gland: Secondary | ICD-10-CM

## 2019-03-14 DIAGNOSIS — Z5112 Encounter for antineoplastic immunotherapy: Secondary | ICD-10-CM | POA: Diagnosis not present

## 2019-03-14 DIAGNOSIS — D649 Anemia, unspecified: Secondary | ICD-10-CM | POA: Diagnosis not present

## 2019-03-14 LAB — CBC WITH DIFFERENTIAL (CANCER CENTER ONLY)
Abs Immature Granulocytes: 0.02 10*3/uL (ref 0.00–0.07)
Basophils Absolute: 0 10*3/uL (ref 0.0–0.1)
Basophils Relative: 0 %
Eosinophils Absolute: 0.2 10*3/uL (ref 0.0–0.5)
Eosinophils Relative: 3 %
HCT: 29.5 % — ABNORMAL LOW (ref 39.0–52.0)
Hemoglobin: 9.2 g/dL — ABNORMAL LOW (ref 13.0–17.0)
Immature Granulocytes: 0 %
Lymphocytes Relative: 12 %
Lymphs Abs: 0.7 10*3/uL (ref 0.7–4.0)
MCH: 29.1 pg (ref 26.0–34.0)
MCHC: 31.2 g/dL (ref 30.0–36.0)
MCV: 93.4 fL (ref 80.0–100.0)
Monocytes Absolute: 1 10*3/uL (ref 0.1–1.0)
Monocytes Relative: 17 %
Neutro Abs: 3.8 10*3/uL (ref 1.7–7.7)
Neutrophils Relative %: 68 %
Platelet Count: 331 10*3/uL (ref 150–400)
RBC: 3.16 MIL/uL — ABNORMAL LOW (ref 4.22–5.81)
RDW: 16.9 % — ABNORMAL HIGH (ref 11.5–15.5)
WBC Count: 5.6 10*3/uL (ref 4.0–10.5)
nRBC: 0 % (ref 0.0–0.2)

## 2019-03-14 LAB — CMP (CANCER CENTER ONLY)
ALT: 16 U/L (ref 0–44)
AST: 29 U/L (ref 15–41)
Albumin: 2.7 g/dL — ABNORMAL LOW (ref 3.5–5.0)
Alkaline Phosphatase: 93 U/L (ref 38–126)
Anion gap: 10 (ref 5–15)
BUN: 23 mg/dL (ref 8–23)
CO2: 29 mmol/L (ref 22–32)
Calcium: 9.7 mg/dL (ref 8.9–10.3)
Chloride: 99 mmol/L (ref 98–111)
Creatinine: 1.14 mg/dL (ref 0.61–1.24)
GFR, Est AFR Am: >60 mL/min (ref >60)
GFR, Estimated: >60 mL/min (ref >60)
Glucose, Bld: 94 mg/dL (ref 70–99)
Potassium: 4.2 mmol/L (ref 3.5–5.1)
Sodium: 138 mmol/L (ref 135–145)
Total Bilirubin: 0.3 mg/dL (ref 0.3–1.2)
Total Protein: 8 g/dL (ref 6.5–8.1)

## 2019-03-14 MED ORDER — SODIUM CHLORIDE 0.9 % IV SOLN
200.0000 mg | Freq: Once | INTRAVENOUS | Status: AC
  Administered 2019-03-14: 12:00:00 200 mg via INTRAVENOUS
  Filled 2019-03-14: qty 8

## 2019-03-14 MED ORDER — SODIUM CHLORIDE 0.9 % IV SOLN
Freq: Once | INTRAVENOUS | Status: AC
  Filled 2019-03-14: qty 250

## 2019-03-14 MED ORDER — ONDANSETRON HCL 4 MG/2ML IJ SOLN
8.0000 mg | Freq: Once | INTRAMUSCULAR | Status: AC
  Administered 2019-03-14: 11:00:00 8 mg via INTRAVENOUS

## 2019-03-14 MED ORDER — SODIUM CHLORIDE 0.9 % IV SOLN: 8.0000 mg | Freq: Once | INTRAVENOUS | Status: DC

## 2019-03-14 MED ORDER — ONDANSETRON HCL 4 MG/2ML IJ SOLN
INTRAMUSCULAR | Status: AC
  Filled 2019-03-14: qty 4

## 2019-03-14 MED ORDER — SODIUM CHLORIDE 0.9 % IV SOLN
900.0000 mg | Freq: Once | INTRAVENOUS | Status: AC
  Administered 2019-03-14: 900 mg via INTRAVENOUS
  Filled 2019-03-14: qty 16

## 2019-03-14 MED ORDER — PROCHLORPERAZINE MALEATE 10 MG PO TABS: 10.0000 mg | ORAL_TABLET | Freq: Four times a day (QID) | ORAL | 0 refills | Status: DC | PRN

## 2019-03-14 MED ORDER — SODIUM CHLORIDE 0.9 % IV SOLN
Freq: Once | INTRAVENOUS | Status: AC
  Administered 2019-03-14: 11:00:00 via INTRAVENOUS
  Filled 2019-03-14: qty 250

## 2019-03-14 NOTE — Patient Instructions (Signed)
[facility] Discharge Instructions for Patients Receiving Chemotherapy  Today you received the following chemotherapy agents: Pemetrexed (Alimta) and Pembrolizumab (Keytruda)  To help prevent nausea and vomiting after your treatment, we encourage you to take your nausea medication as directed.   If you develop nausea and vomiting that is not controlled by your nausea medication, call the clinic.   BELOW ARE SYMPTOMS THAT SHOULD BE REPORTED IMMEDIATELY:  *FEVER GREATER THAN 100.5 F  *CHILLS WITH OR WITHOUT FEVER  NAUSEA AND VOMITING THAT IS NOT CONTROLLED WITH YOUR NAUSEA MEDICATION  *UNUSUAL SHORTNESS OF BREATH  *UNUSUAL BRUISING OR BLEEDING  TENDERNESS IN MOUTH AND THROAT WITH OR WITHOUT PRESENCE OF ULCERS  *URINARY PROBLEMS  *BOWEL PROBLEMS  UNUSUAL RASH Items with * indicate a potential emergency and should be followed up as soon as possible.  Feel free to call the clinic should you have any questions or concerns. The clinic phone number is [phone].  Please show the [place] at check-in to the Emergency Department and triage nurse.  Coronavirus (COVID-19) Are you at risk?  Are you at risk for the Coronavirus (COVID-19)?  To be considered HIGH RISK for Coronavirus (COVID-19), you have to meet the following criteria:  . Traveled to [place], [place], [place], [place] or [place]; or in the [place] to [place], [place], [place], or [place]; and have fever, cough, and shortness of breath within the last 2 weeks of travel OR . Been in close contact with a person diagnosed with COVID-19 within the last 2 weeks and have fever, cough, and shortness of breath . IF YOU DO NOT MEET THESE CRITERIA, YOU ARE CONSIDERED LOW RISK FOR COVID-19.  What to do if you are HIGH RISK for COVID-19?  Marland Kitchen If you are having a medical emergency, call 911. . Seek medical care right away. Before you go to a doctor's office, urgent care or emergency department,  call ahead and tell them about your recent travel, contact with someone diagnosed with COVID-19, and your symptoms. You should receive instructions from your physician's office regarding next steps of care.  . When you arrive at healthcare provider, tell the healthcare staff immediately you have returned from visiting [place], [place], [place], [place] or [place]; or traveled in the [place] to [place], [place], [place], or [place]; in the last two weeks or you have been in close contact with a person diagnosed with COVID-19 in the last 2 weeks.   . Tell the health care staff about your symptoms: fever, cough and shortness of breath. . After you have been seen by a medical provider, you will be either: o Tested for (COVID-19) and discharged home on quarantine except to seek medical care if symptoms worsen, and asked to  - Stay home and avoid contact with others until you get your results (4-5 days)  - Avoid travel on public transportation if possible (such as bus, train, or airplane) or o Sent to the Emergency Department by EMS for evaluation, COVID-19 testing, and possible admission depending on your condition and test results.  What to do if you are LOW RISK for COVID-19?  Reduce your risk of any infection by using the same precautions used for avoiding the common cold or flu:  Marland Kitchen Wash your hands often with soap and warm water for at least 20 seconds.  If soap and water are not readily available, use an alcohol-based hand sanitizer with at least 60% alcohol.  Marland Kitchen  If coughing or sneezing, cover your mouth and nose by coughing or sneezing into the elbow areas of your shirt or coat, into a tissue or into your sleeve (not your hands). . Avoid shaking hands with others and consider head nods or verbal greetings only. . Avoid touching your eyes, nose, or mouth with unwashed hands.  . Avoid close contact with people who are sick. . Avoid places or events with large numbers of people in one  location, like concerts or sporting events. . Carefully consider travel plans you have or are making. . If you are planning any travel outside or inside the [place], visit the CDC's Travelers' Health webpage for the latest health notices. . If you have some symptoms but not all symptoms, continue to monitor at home and seek medical attention if your symptoms worsen. . If you are having a medical emergency, call 911.   [place] / e-Visit: [email or web address]         [facility]: [facility]: [phone]                   [facility]: [phone]

## 2019-03-14 NOTE — Progress Notes (Signed)
[place] OFFICE PROGRESS NOTE   Diagnosis: Non-small cell lung cancer  INTERVAL HISTORY:   Brian Ramirez completed another treatment with Alimta and pembrolizumab on 02/21/2019.  He reports nausea for a week following chemotherapy.  He spit up mucus, but no emesis.  His bowels are moving.  He continues oxygen via a 2 L nasal cannula.  He is ambulating in the home.  Objective:  Vital signs in last 24 hours:  Blood pressure 140/81, pulse 88, temperature 98.7 F (37.1 C), temperature source Oral, resp. rate 19, height '6\' 1"'  (1.854 m), weight 144 lb 4.8 oz (65.5 kg), SpO2 97 %.    Resp: Decreased breath sounds throughout the right chest, end inspiratory rub of the left posterior base, no respiratory distress Cardio: Irregular GI: Nontender, no hepatomegaly Vascular: No leg edema    Lab Results:  Lab Results  Component Value Date   WBC 5.6 03/14/2019   HGB 9.2 (L) 03/14/2019   HCT 29.5 (L) 03/14/2019   MCV 93.4 03/14/2019   PLT 331 03/14/2019   NEUTROABS 3.8 03/14/2019    CMP  Lab Results  Component Value Date   NA 138 03/14/2019   K 4.2 03/14/2019   CL 99 03/14/2019   CO2 29 03/14/2019   GLUCOSE 94 03/14/2019   BUN 23 03/14/2019   CREATININE 1.14 03/14/2019   CALCIUM 9.7 03/14/2019   PROT 8.0 03/14/2019   ALBUMIN 2.7 (L) 03/14/2019   AST 29 03/14/2019   ALT 16 03/14/2019   ALKPHOS 93 03/14/2019   BILITOT 0.3 03/14/2019   GFRNONAA >60 03/14/2019   GFRAA >60 03/14/2019    Medications: I have reviewed the patient's current medications.   Assessment/Plan: 1.Metastatic non-small cell lung cancer  largeright pleural effusion, right lung mass, right pleural-based masses, left lung nodules  Right thoracentesis 09/04/2018-negative cytology  CT biopsy of right lateral pleural mass 09/07/2018-poorly differentiated non-small cell carcinoma, malignant cells positive for cytokeratin AE1/AE3 and cytokeratin 8/18, histology favoring adenocarcinoma,  MSS, tumor mutation burden 5.no EGFR, ALK, BRAF alteration. PDL 1 rearrangement  PDL 1 tumor proportion score-100%   Cycle 1 Alimta/carboplatin 09/22/2018, pembrolizumab 09/28/2018  Cycle 2 Alimta/carboplatin/pembrolizumab 10/15/2018  Cycle 3 Alimta/carboplatin/pembrolizumab 11/06/2018  Cycle4Alimta/carboplatin/pembrolizumab 11/28/2018  CT chest 12/14/2018-response to therapy of lung primary/primaries, thoracic nodal and pulmonary metastasis. Decrease in right pleural effusion with improvement in pleural-based metastasis. New T10 heterogeneous sclerotic density likely due to healing of previously CT occult metastasis.  Pembrolizumab 12/19/2018  Pembrolizumab/Alimta starting 01/09/2019  Pembrolizumab/Alimta 01/31/2019  Pembrolizumab/Alimta 02/21/2019  Pembrolizumab/Alimta 03/14/2019  2.Severe anemia-likely secondary to bleeding in the right pleural space and metastatic carcinoma, red cell transfusions 09/09/2018 09/10/2018, 09/23/2018-improved 3.Dyspnea secondary to the large right pleural effusion and anemia  Right Pleurx catheter placed 09/11/2018  Chest x-ray 10/09/2018-large right pleural effusion, loculated  Chest x-ray 11/06/2018-no interval change in the right-sided pleural effusion  Pleurx drainage catheter removed 11/13/2018 4.History of tobacco use 5.History of kidney stones 6. Fever-most likely tumor fever, improved 7. Admission 10/08/2018 with rapid atrial fibrillation/flutter-improved with diltiazem, started on Eliquis anticoagulation. Eliquis discontinued. Now on aspirin.     Disposition: Brian Ramirez continues treatment with pembrolizumab and Alimta.  He had nausea following the most recent cycle of treatment.  I will adjust the antiemetic premedication today and he will use Compazine as needed.  He will contact us if this does not relieve the nausea.  He will undergo a restaging CT evaluation after this cycle.  The heart rate is irregular today.  We will  check an EKG.  If he is in atrial fibrillation we will consider resuming apixaban anticoagulation.  Brian Ramirez will return for an office visit in 3 weeks.  Betsy Coder, MD  03/14/2019  9:16 AM Addendum: EKG reveals a sinus rhythm with PACs

## 2019-03-14 NOTE — Telephone Encounter (Signed)
Called and left msg for patient. Mailed printout

## 2019-03-31 ENCOUNTER — Other Ambulatory Visit: Payer: Self-pay | Admitting: Oncology

## 2019-04-01 ENCOUNTER — Ambulatory Visit
Admission: RE | Admit: 2019-04-01 | Discharge: 2019-04-01 | Disposition: A | Discharge status: Home / Self Care | Payer: Medicare Other | Source: Ambulatory Visit | Attending: Oncology | Admitting: Oncology

## 2019-04-01 ENCOUNTER — Other Ambulatory Visit: Payer: Self-pay

## 2019-04-01 ENCOUNTER — Inpatient Hospital Stay: Payer: Medicare Other | Attending: Oncology

## 2019-04-01 DIAGNOSIS — I700 Atherosclerosis of aorta: Secondary | ICD-10-CM | POA: Diagnosis not present

## 2019-04-01 DIAGNOSIS — Z87891 Personal history of nicotine dependence: Secondary | ICD-10-CM | POA: Insufficient documentation

## 2019-04-01 DIAGNOSIS — C7971 Secondary malignant neoplasm of right adrenal gland: Secondary | ICD-10-CM | POA: Diagnosis not present

## 2019-04-01 DIAGNOSIS — J432 Centrilobular emphysema: Secondary | ICD-10-CM | POA: Diagnosis not present

## 2019-04-01 DIAGNOSIS — Z5112 Encounter for antineoplastic immunotherapy: Secondary | ICD-10-CM | POA: Diagnosis not present

## 2019-04-01 DIAGNOSIS — Z7901 Long term (current) use of anticoagulants: Secondary | ICD-10-CM | POA: Insufficient documentation

## 2019-04-01 DIAGNOSIS — C3491 Malignant neoplasm of unspecified part of right bronchus or lung: Secondary | ICD-10-CM | POA: Diagnosis not present

## 2019-04-01 DIAGNOSIS — J90 Pleural effusion, not elsewhere classified: Secondary | ICD-10-CM | POA: Diagnosis not present

## 2019-04-01 DIAGNOSIS — M47814 Spondylosis without myelopathy or radiculopathy, thoracic region: Secondary | ICD-10-CM | POA: Diagnosis not present

## 2019-04-01 DIAGNOSIS — Z5111 Encounter for antineoplastic chemotherapy: Secondary | ICD-10-CM | POA: Insufficient documentation

## 2019-04-01 DIAGNOSIS — D649 Anemia, unspecified: Secondary | ICD-10-CM | POA: Diagnosis not present

## 2019-04-01 DIAGNOSIS — J910 Malignant pleural effusion: Secondary | ICD-10-CM | POA: Diagnosis not present

## 2019-04-01 DIAGNOSIS — J929 Pleural plaque without asbestos: Secondary | ICD-10-CM | POA: Diagnosis not present

## 2019-04-01 DIAGNOSIS — C3490 Malignant neoplasm of unspecified part of unspecified bronchus or lung: Secondary | ICD-10-CM

## 2019-04-01 LAB — CMP (CANCER CENTER ONLY)
ALT: 35 U/L (ref 0–44)
AST: 42 U/L — ABNORMAL HIGH (ref 15–41)
Albumin: 2.5 g/dL — ABNORMAL LOW (ref 3.5–5.0)
Alkaline Phosphatase: 84 U/L (ref 38–126)
Anion gap: 9 (ref 5–15)
BUN: 31 mg/dL — ABNORMAL HIGH (ref 8–23)
CO2: 30 mmol/L (ref 22–32)
Calcium: 9.3 mg/dL (ref 8.9–10.3)
Chloride: 98 mmol/L (ref 98–111)
Creatinine: 1.38 mg/dL — ABNORMAL HIGH (ref 0.61–1.24)
GFR, Est AFR Am: 59 mL/min — ABNORMAL LOW (ref >60)
GFR, Estimated: 51 mL/min — ABNORMAL LOW (ref >60)
Glucose, Bld: 105 mg/dL — ABNORMAL HIGH (ref 70–99)
Potassium: 4.2 mmol/L (ref 3.5–5.1)
Sodium: 137 mmol/L (ref 135–145)
Total Bilirubin: 0.2 mg/dL — ABNORMAL LOW (ref 0.3–1.2)
Total Protein: 7.8 g/dL (ref 6.5–8.1)

## 2019-04-01 LAB — CBC WITH DIFFERENTIAL (CANCER CENTER ONLY)
Abs Immature Granulocytes: 0.04 10*3/uL (ref 0.00–0.07)
Basophils Absolute: 0 10*3/uL (ref 0.0–0.1)
Basophils Relative: 0 %
Eosinophils Absolute: 0.1 10*3/uL (ref 0.0–0.5)
Eosinophils Relative: 1 %
HCT: 24.9 % — ABNORMAL LOW (ref 39.0–52.0)
Hemoglobin: 7.6 g/dL — ABNORMAL LOW (ref 13.0–17.0)
Immature Granulocytes: 1 %
Lymphocytes Relative: 13 %
Lymphs Abs: 0.9 10*3/uL (ref 0.7–4.0)
MCH: 30 pg (ref 26.0–34.0)
MCHC: 30.5 g/dL (ref 30.0–36.0)
MCV: 98.4 fL (ref 80.0–100.0)
Monocytes Absolute: 1.1 10*3/uL — ABNORMAL HIGH (ref 0.1–1.0)
Monocytes Relative: 16 %
Neutro Abs: 4.8 10*3/uL (ref 1.7–7.7)
Neutrophils Relative %: 69 %
Platelet Count: 385 10*3/uL (ref 150–400)
RBC: 2.53 MIL/uL — ABNORMAL LOW (ref 4.22–5.81)
RDW: 18.3 % — ABNORMAL HIGH (ref 11.5–15.5)
WBC Count: 6.9 10*3/uL (ref 4.0–10.5)
nRBC: 0 % (ref 0.0–0.2)

## 2019-04-01 MED ORDER — IOPAMIDOL (ISOVUE-300) INJECTION 61%
75.0000 mL | Freq: Once | INTRAVENOUS | Status: AC | PRN
  Administered 2019-04-01: 75 mL via INTRAVENOUS

## 2019-04-03 ENCOUNTER — Other Ambulatory Visit: Payer: Medicare Other

## 2019-04-04 ENCOUNTER — Other Ambulatory Visit: Payer: Self-pay | Admitting: *Deleted

## 2019-04-04 ENCOUNTER — Telehealth: Payer: Self-pay | Admitting: Oncology

## 2019-04-04 DIAGNOSIS — C3490 Malignant neoplasm of unspecified part of unspecified bronchus or lung: Secondary | ICD-10-CM

## 2019-04-04 NOTE — Telephone Encounter (Signed)
Scheduled appt per 8/20 sch message- left message for patient with appt date and time

## 2019-04-05 ENCOUNTER — Inpatient Hospital Stay: Payer: Medicare Other

## 2019-04-05 ENCOUNTER — Other Ambulatory Visit: Payer: Self-pay

## 2019-04-05 ENCOUNTER — Inpatient Hospital Stay (HOSPITAL_BASED_OUTPATIENT_CLINIC_OR_DEPARTMENT_OTHER): Payer: Medicare Other | Admitting: Oncology

## 2019-04-05 VITALS — BP 122/76 | HR 97 | Temp 98.2°F | Resp 18 | Ht 73.0 in | Wt 146.1 lb

## 2019-04-05 DIAGNOSIS — Z5112 Encounter for antineoplastic immunotherapy: Secondary | ICD-10-CM | POA: Diagnosis not present

## 2019-04-05 DIAGNOSIS — C7971 Secondary malignant neoplasm of right adrenal gland: Secondary | ICD-10-CM | POA: Diagnosis not present

## 2019-04-05 DIAGNOSIS — J910 Malignant pleural effusion: Secondary | ICD-10-CM | POA: Diagnosis not present

## 2019-04-05 DIAGNOSIS — D649 Anemia, unspecified: Secondary | ICD-10-CM | POA: Diagnosis not present

## 2019-04-05 DIAGNOSIS — C3490 Malignant neoplasm of unspecified part of unspecified bronchus or lung: Secondary | ICD-10-CM | POA: Diagnosis not present

## 2019-04-05 DIAGNOSIS — Z5111 Encounter for antineoplastic chemotherapy: Secondary | ICD-10-CM | POA: Diagnosis not present

## 2019-04-05 DIAGNOSIS — R5383 Other fatigue: Secondary | ICD-10-CM

## 2019-04-05 DIAGNOSIS — C3491 Malignant neoplasm of unspecified part of right bronchus or lung: Secondary | ICD-10-CM | POA: Diagnosis not present

## 2019-04-05 LAB — CBC WITH DIFFERENTIAL (CANCER CENTER ONLY)
Abs Immature Granulocytes: 0.02 10*3/uL (ref 0.00–0.07)
Basophils Absolute: 0 10*3/uL (ref 0.0–0.1)
Basophils Relative: 0 %
Eosinophils Absolute: 0.1 10*3/uL (ref 0.0–0.5)
Eosinophils Relative: 2 %
HCT: 25.8 % — ABNORMAL LOW (ref 39.0–52.0)
Hemoglobin: 8.1 g/dL — ABNORMAL LOW (ref 13.0–17.0)
Immature Granulocytes: 0 %
Lymphocytes Relative: 13 %
Lymphs Abs: 0.7 10*3/uL (ref 0.7–4.0)
MCH: 30 pg (ref 26.0–34.0)
MCHC: 31.4 g/dL (ref 30.0–36.0)
MCV: 95.6 fL (ref 80.0–100.0)
Monocytes Absolute: 0.8 10*3/uL (ref 0.1–1.0)
Monocytes Relative: 15 %
Neutro Abs: 4 10*3/uL (ref 1.7–7.7)
Neutrophils Relative %: 70 %
Platelet Count: 375 10*3/uL (ref 150–400)
RBC: 2.7 MIL/uL — ABNORMAL LOW (ref 4.22–5.81)
RDW: 18.8 % — ABNORMAL HIGH (ref 11.5–15.5)
WBC Count: 5.7 10*3/uL (ref 4.0–10.5)
nRBC: 0 % (ref 0.0–0.2)

## 2019-04-05 LAB — SAMPLE TO BLOOD BANK

## 2019-04-05 MED ORDER — ONDANSETRON HCL 4 MG/2ML IJ SOLN
8.0000 mg | Freq: Once | INTRAMUSCULAR | Status: AC
  Administered 2019-04-05: 11:00:00 8 mg via INTRAVENOUS

## 2019-04-05 MED ORDER — SODIUM CHLORIDE 0.9 % IV SOLN: 8.0000 mg | Freq: Once | INTRAVENOUS | Status: DC

## 2019-04-05 MED ORDER — ONDANSETRON HCL 4 MG/2ML IJ SOLN
INTRAMUSCULAR | Status: AC
  Filled 2019-04-05: qty 4

## 2019-04-05 MED ORDER — SODIUM CHLORIDE 0.9 % IV SOLN
480.0000 mg/m2 | Freq: Once | INTRAVENOUS | Status: AC
  Administered 2019-04-05: 12:00:00 900 mg via INTRAVENOUS
  Filled 2019-04-05: qty 20

## 2019-04-05 MED ORDER — SODIUM CHLORIDE 0.9 % IV SOLN
Freq: Once | INTRAVENOUS | Status: AC
  Administered 2019-04-05: 11:00:00 via INTRAVENOUS
  Filled 2019-04-05: qty 250

## 2019-04-05 MED ORDER — SODIUM CHLORIDE 0.9 % IV SOLN
200.0000 mg | Freq: Once | INTRAVENOUS | Status: AC
  Administered 2019-04-05: 11:00:00 200 mg via INTRAVENOUS
  Filled 2019-04-05: qty 8

## 2019-04-05 MED ORDER — SODIUM CHLORIDE 0.9 % IV SOLN
Freq: Once | INTRAVENOUS | Status: DC
  Filled 2019-04-05: qty 250

## 2019-04-05 NOTE — Progress Notes (Signed)
[place] OFFICE PROGRESS NOTE   Diagnosis: Non-small cell lung cancer  INTERVAL HISTORY:   Brian Ramirez completed another treatment with Alimta/pembrolizumab on 04/05/2019.  He reports discharge and matting mornings.  Stable dyspnea.  Has fatigue following treatment.  Objective:  Vital signs in last 24 hours:  Blood pressure 122/76, pulse 97, temperature 98.2 F (36.8 C), temperature source Oral, resp. rate 18, height '6\' 1"'  (1.854 m), weight 146 lb 1.6 oz (66.3 kg), SpO2 100 %.    Resp: Diminished breath sounds throughout right chest, no respiratory distress Cardio: Regular rate and rhythm GI: No hepatomegaly, nontender Vascular: No leg edema  Skin: Mild hyperpigmentation at the abdomen/low anterior chest    Lab Results:  Lab Results  Component Value Date   WBC 5.7 04/05/2019   HGB 8.1 (L) 04/05/2019   HCT 25.8 (L) 04/05/2019   MCV 95.6 04/05/2019   PLT 375 04/05/2019   NEUTROABS 4.0 04/05/2019    CMP  Lab Results  Component Value Date   NA 137 04/01/2019   K 4.2 04/01/2019   CL 98 04/01/2019   CO2 30 04/01/2019   GLUCOSE 105 (H) 04/01/2019   BUN 31 (H) 04/01/2019   CREATININE 1.38 (H) 04/01/2019   CALCIUM 9.3 04/01/2019   PROT 7.8 04/01/2019   ALBUMIN 2.5 (L) 04/01/2019   AST 42 (H) 04/01/2019   ALT 35 04/01/2019   ALKPHOS 84 04/01/2019   BILITOT 0.2 (L) 04/01/2019   GFRNONAA 51 (L) 04/01/2019   GFRAA 59 (L) 04/01/2019    No results found for: CEA1  Lab Results  Component Value Date   INR 1.0 11/13/2018    Imaging:  Ct Chest W Contrast  Result Date: 04/01/2019 CLINICAL DATA:  Right lung non-small cell lung carcinoma diagnosed January 2020 status post chemo know therapy. EXAM: CT CHEST WITH CONTRAST TECHNIQUE: Multidetector CT imaging of the chest was performed during intravenous contrast administration. CONTRAST:  30m ISOVUE-300 IOPAMIDOL (ISOVUE-300) INJECTION 61% COMPARISON:  12/14/2018 chest CT. FINDINGS: Cardiovascular:  Normal heart size. No significant pericardial effusion/thickening. Left anterior descending coronary atherosclerosis. Atherosclerotic nonaneurysmal thoracic aorta. Normal caliber pulmonary arteries. No central pulmonary emboli. Mediastinum/Nodes: No discrete thyroid nodules. Unremarkable esophagus. No pathologically enlarged axillary, mediastinal or hilar lymph nodes. Lungs/Pleura: No pneumothorax. Small loculated posterior basilar right pleural effusion with diffuse right pleural thickening and enhancement, not substantially changed. Medial basilar right pleural enhancing 1.2 cm nodule (series 2/image 151), previously 1.3 cm, not appreciably changed. Trace dependent left pleural effusion. Mild centrilobular emphysema. Mild-to-moderate compressive atelectasis at the dependent right lung base. Similar mild varicoid bronchiectasis with scattered mucoid impaction in the inferior segment lingula. Left lower lobe 3 mm solid pulmonary nodule (series 5/image 128) is stable. No acute consolidative airspace disease or new significant pulmonary nodules. Upper abdomen: No acute abnormality. Musculoskeletal: Continued slightly increased sclerosis within the posterior T10 vertebral lesion. No new focal osseous lesions in the chest. Moderate thoracic spondylosis. IMPRESSION: 1. No findings suspicious for new or progressive metastatic disease in the chest. 2. Loculated small right pleural effusion with diffuse right pleural thickening and enhancement and basilar right pleural nodularity, stable. 3. Continued slightly increased sclerosis within the posterior T10 vertebral lesion compatible with osseous metastasis, probably reflecting continued treatment effect. No new osseous lesions. 4. Stable tiny left lower lobe pulmonary nodule. No new or enlarging pulmonary nodules. Aortic Atherosclerosis (ICD10-I70.0) and Emphysema (ICD10-J43.9). Electronically Signed   By: JIlona SorrelM.D.   On: 04/01/2019 11:36    Medications: I have  reviewed the patient's current medications.   Assessment/Plan: 1. Metastatic non-small cell lung cancer  largeright pleural effusion, right lung mass, right pleural-based masses, left lung nodules  Right thoracentesis 09/04/2018-negative cytology  CT biopsy of right lateral pleural mass 09/07/2018-poorly differentiated non-small cell carcinoma, malignant cells positive for cytokeratin AE1/AE3 and cytokeratin 8/18, histology favoring adenocarcinoma, MSS, tumor mutation burden 5.no EGFR, ALK, BRAF alteration. PDL 1 rearrangement  PDL 1 tumor proportion score-100%   Cycle 1 Alimta/carboplatin 09/22/2018, pembrolizumab 09/28/2018  Cycle 2 Alimta/carboplatin/pembrolizumab 10/15/2018  Cycle 3 Alimta/carboplatin/pembrolizumab 11/06/2018  Cycle4Alimta/carboplatin/pembrolizumab 11/28/2018  CT chest 12/14/2018-response to therapy of lung primary/primaries, thoracic nodal and pulmonary metastasis. Decrease in right pleural effusion with improvement in pleural-based metastasis. New T10 heterogeneous sclerotic density likely due to healing of previously CT occult metastasis.  Pembrolizumab 12/19/2018  Pembrolizumab/Alimta starting 01/09/2019  Pembrolizumab/Alimta 01/31/2019  Pembrolizumab/Alimta 02/21/2019  Pembrolizumab/Alimta 03/14/2019  CT 04/01/2019- no evidence of disease progression, small loculated right pleural effusion and right basilar pleural nodularity-stable, stable left lower lobe nodule, persistent sclerosis at T10  Pembrolizumab/Alimta 04/05/2019  2.Severe anemia-likely secondary to bleeding in the right pleural space and metastatic carcinoma, red cell transfusions 09/09/2018 09/10/2018, 09/23/2018-improved 3.Dyspnea secondary to the large right pleural effusion and anemia  Right Pleurx catheter placed 09/11/2018  Chest x-ray 10/09/2018-large right pleural effusion, loculated  Chest x-ray 11/06/2018-no interval change in the right-sided pleural effusion  Pleurx drainage  catheter removed 11/13/2018 4.History of tobacco use 5.History of kidney stones 6. Fever-most likely tumor fever, improved 7. Admission 10/08/2018 with rapid atrial fibrillation/flutter-improved with diltiazem, started on Eliquis anticoagulation. Eliquis discontinued. Now on aspirin.     Disposition: Brian Ramirez appears unchanged.  Restaging CT shows no evidence of disease progression.  He pembrolizumab.  He appears to be tolerating the treatment well.  The hemoglobin is higher today.  He does not appear to have significant symptoms from anemia.  He will return for an office visit and chemotherapy in 3 weeks.  We will check his TSH when he returns in 3 weeks.  I reviewed the CT images.  25 minutes with patient today.  The majority of the time was used for counseling and coronation of care.  Betsy Coder, MD  04/05/2019  9:33 AM

## 2019-04-05 NOTE — Patient Instructions (Signed)
[facility] Discharge Instructions for Patients Receiving Chemotherapy  Today you received the following chemotherapy agents: Pemetrexed (Alimta) and Pembrolizumab (Keytruda)  To help prevent nausea and vomiting after your treatment, we encourage you to take your nausea medication as directed.   If you develop nausea and vomiting that is not controlled by your nausea medication, call the clinic.   BELOW ARE SYMPTOMS THAT SHOULD BE REPORTED IMMEDIATELY:  *FEVER GREATER THAN 100.5 F  *CHILLS WITH OR WITHOUT FEVER  NAUSEA AND VOMITING THAT IS NOT CONTROLLED WITH YOUR NAUSEA MEDICATION  *UNUSUAL SHORTNESS OF BREATH  *UNUSUAL BRUISING OR BLEEDING  TENDERNESS IN MOUTH AND THROAT WITH OR WITHOUT PRESENCE OF ULCERS  *URINARY PROBLEMS  *BOWEL PROBLEMS  UNUSUAL RASH Items with * indicate a potential emergency and should be followed up as soon as possible.  Feel free to call the clinic should you have any questions or concerns. The clinic phone number is [phone].  Please show the [place] at check-in to the Emergency Department and triage nurse.  Coronavirus (COVID-19) Are you at risk?  Are you at risk for the Coronavirus (COVID-19)?  To be considered HIGH RISK for Coronavirus (COVID-19), you have to meet the following criteria:  . Traveled to [place], [place], [place], [place] or [place]; or in the [place] to [place], [place], [place], or [place]; and have fever, cough, and shortness of breath within the last 2 weeks of travel OR . Been in close contact with a person diagnosed with COVID-19 within the last 2 weeks and have fever, cough, and shortness of breath . IF YOU DO NOT MEET THESE CRITERIA, YOU ARE CONSIDERED LOW RISK FOR COVID-19.  What to do if you are HIGH RISK for COVID-19?  Marland Kitchen If you are having a medical emergency, call 911. . Seek medical care right away. Before you go to a doctor's office, urgent care or emergency department,  call ahead and tell them about your recent travel, contact with someone diagnosed with COVID-19, and your symptoms. You should receive instructions from your physician's office regarding next steps of care.  . When you arrive at healthcare provider, tell the healthcare staff immediately you have returned from visiting [place], [place], [place], [place] or [place]; or traveled in the [place] to [place], [place], [place], or [place]; in the last two weeks or you have been in close contact with a person diagnosed with COVID-19 in the last 2 weeks.   . Tell the health care staff about your symptoms: fever, cough and shortness of breath. . After you have been seen by a medical provider, you will be either: o Tested for (COVID-19) and discharged home on quarantine except to seek medical care if symptoms worsen, and asked to  - Stay home and avoid contact with others until you get your results (4-5 days)  - Avoid travel on public transportation if possible (such as bus, train, or airplane) or o Sent to the Emergency Department by EMS for evaluation, COVID-19 testing, and possible admission depending on your condition and test results.  What to do if you are LOW RISK for COVID-19?  Reduce your risk of any infection by using the same precautions used for avoiding the common cold or flu:  Marland Kitchen Wash your hands often with soap and warm water for at least 20 seconds.  If soap and water are not readily available, use an alcohol-based hand sanitizer with at least 60% alcohol.  Marland Kitchen  If coughing or sneezing, cover your mouth and nose by coughing or sneezing into the elbow areas of your shirt or coat, into a tissue or into your sleeve (not your hands). . Avoid shaking hands with others and consider head nods or verbal greetings only. . Avoid touching your eyes, nose, or mouth with unwashed hands.  . Avoid close contact with people who are sick. . Avoid places or events with large numbers of people in one  location, like concerts or sporting events. . Carefully consider travel plans you have or are making. . If you are planning any travel outside or inside the [place], visit the CDC's Travelers' Health webpage for the latest health notices. . If you have some symptoms but not all symptoms, continue to monitor at home and seek medical attention if your symptoms worsen. . If you are having a medical emergency, call 911.   [facility] / e-Visit: [email or web address]         [facility]: [facility]: [phone]                   [facility]: [phone]

## 2019-04-08 ENCOUNTER — Telehealth: Payer: Self-pay | Admitting: Oncology

## 2019-04-08 NOTE — Telephone Encounter (Signed)
Called and left msg for patient. Mailed printout

## 2019-04-23 ENCOUNTER — Other Ambulatory Visit: Payer: Self-pay | Admitting: Oncology

## 2019-04-26 ENCOUNTER — Inpatient Hospital Stay: Payer: Medicare Other

## 2019-04-26 ENCOUNTER — Other Ambulatory Visit: Payer: Self-pay | Admitting: *Deleted

## 2019-04-26 ENCOUNTER — Other Ambulatory Visit: Payer: Self-pay

## 2019-04-26 ENCOUNTER — Telehealth: Payer: Self-pay | Admitting: *Deleted

## 2019-04-26 ENCOUNTER — Encounter: Payer: Self-pay | Admitting: Nurse Practitioner

## 2019-04-26 ENCOUNTER — Inpatient Hospital Stay: Payer: Medicare Other | Attending: Oncology | Admitting: Nurse Practitioner

## 2019-04-26 VITALS — BP 132/73 | HR 100 | Temp 98.3°F | Resp 18 | Ht 73.0 in | Wt 144.9 lb

## 2019-04-26 DIAGNOSIS — C3490 Malignant neoplasm of unspecified part of unspecified bronchus or lung: Secondary | ICD-10-CM

## 2019-04-26 DIAGNOSIS — C3491 Malignant neoplasm of unspecified part of right bronchus or lung: Secondary | ICD-10-CM | POA: Insufficient documentation

## 2019-04-26 DIAGNOSIS — Z7901 Long term (current) use of anticoagulants: Secondary | ICD-10-CM | POA: Diagnosis not present

## 2019-04-26 DIAGNOSIS — Z79899 Other long term (current) drug therapy: Secondary | ICD-10-CM | POA: Insufficient documentation

## 2019-04-26 DIAGNOSIS — D649 Anemia, unspecified: Secondary | ICD-10-CM | POA: Insufficient documentation

## 2019-04-26 DIAGNOSIS — I4891 Unspecified atrial fibrillation: Secondary | ICD-10-CM | POA: Diagnosis not present

## 2019-04-26 DIAGNOSIS — Z5112 Encounter for antineoplastic immunotherapy: Secondary | ICD-10-CM | POA: Diagnosis not present

## 2019-04-26 DIAGNOSIS — R5383 Other fatigue: Secondary | ICD-10-CM

## 2019-04-26 LAB — CMP (CANCER CENTER ONLY)
ALT: 29 U/L (ref 0–44)
AST: 38 U/L (ref 15–41)
Albumin: 2.5 g/dL — ABNORMAL LOW (ref 3.5–5.0)
Alkaline Phosphatase: 94 U/L (ref 38–126)
Anion gap: 8 (ref 5–15)
BUN: 37 mg/dL — ABNORMAL HIGH (ref 8–23)
CO2: 33 mmol/L — ABNORMAL HIGH (ref 22–32)
Calcium: 9.2 mg/dL (ref 8.9–10.3)
Chloride: 96 mmol/L — ABNORMAL LOW (ref 98–111)
Creatinine: 1.75 mg/dL — ABNORMAL HIGH (ref 0.61–1.24)
GFR, Est AFR Am: 44 mL/min — ABNORMAL LOW (ref >60)
GFR, Estimated: 38 mL/min — ABNORMAL LOW (ref >60)
Glucose, Bld: 111 mg/dL — ABNORMAL HIGH (ref 70–99)
Potassium: 4 mmol/L (ref 3.5–5.1)
Sodium: 137 mmol/L (ref 135–145)
Total Bilirubin: 0.3 mg/dL (ref 0.3–1.2)
Total Protein: 7.6 g/dL (ref 6.5–8.1)

## 2019-04-26 LAB — CBC WITH DIFFERENTIAL (CANCER CENTER ONLY)
Abs Immature Granulocytes: 0.04 10*3/uL (ref 0.00–0.07)
Basophils Absolute: 0 10*3/uL (ref 0.0–0.1)
Basophils Relative: 0 %
Eosinophils Absolute: 0.1 10*3/uL (ref 0.0–0.5)
Eosinophils Relative: 1 %
HCT: 21.6 % — ABNORMAL LOW (ref 39.0–52.0)
Hemoglobin: 6.8 g/dL — CL (ref 13.0–17.0)
Immature Granulocytes: 1 %
Lymphocytes Relative: 10 %
Lymphs Abs: 0.6 10*3/uL — ABNORMAL LOW (ref 0.7–4.0)
MCH: 31.8 pg (ref 26.0–34.0)
MCHC: 31.5 g/dL (ref 30.0–36.0)
MCV: 100.9 fL — ABNORMAL HIGH (ref 80.0–100.0)
Monocytes Absolute: 1.2 10*3/uL — ABNORMAL HIGH (ref 0.1–1.0)
Monocytes Relative: 21 %
Neutro Abs: 3.9 10*3/uL (ref 1.7–7.7)
Neutrophils Relative %: 67 %
Platelet Count: 352 10*3/uL (ref 150–400)
RBC: 2.14 MIL/uL — ABNORMAL LOW (ref 4.22–5.81)
RDW: 19.4 % — ABNORMAL HIGH (ref 11.5–15.5)
WBC Count: 5.8 10*3/uL (ref 4.0–10.5)
nRBC: 0 % (ref 0.0–0.2)

## 2019-04-26 LAB — PREPARE RBC (CROSSMATCH)

## 2019-04-26 LAB — SAMPLE TO BLOOD BANK

## 2019-04-26 LAB — TSH: TSH: 2.306 u[IU]/mL (ref 0.320–4.118)

## 2019-04-26 MED ORDER — SODIUM CHLORIDE 0.9% FLUSH
3.0000 mL | INTRAVENOUS | Status: DC | PRN
  Filled 2019-04-26: qty 10

## 2019-04-26 MED ORDER — SODIUM CHLORIDE 0.9% IV SOLUTION
250.0000 mL | Freq: Once | INTRAVENOUS | Status: DC
  Filled 2019-04-26: qty 250

## 2019-04-26 NOTE — Progress Notes (Signed)
[place] OFFICE PROGRESS NOTE   Diagnosis: Non-small cell lung cancer  INTERVAL HISTORY:   Brian Ramirez returns as scheduled.  He completed a cycle of pembrolizumab/Alimta 04/05/2019.  He has occasional mild nausea relieved with Compazine.  No mouth sores.  No diarrhea.  No rash.  He denies shortness of breath.  He continues supplemental oxygen.  He denies bleeding.  No black stools.  Energy level remains poor.   Objective:  Vital signs in last 24 hours:  Blood pressure 132/73, pulse 100, temperature 98.3 F (36.8 C), temperature source Oral, resp. rate 18, height '6\' 1"'  (1.854 m), weight 144 lb 14.4 oz (65.7 kg), SpO2 97 %.    HEENT: No thrush or ulcers. Resp: Breath sounds diminished right lung field.  No respiratory distress. Cardio: Regular rate and rhythm. GI: Abdomen soft and nontender.  No hepatomegaly. Vascular: Trace edema at the lower legs bilaterally. Skin: No rash.   Lab Results:  Lab Results  Component Value Date   WBC 5.8 04/26/2019   HGB 6.8 (LL) 04/26/2019   HCT 21.6 (L) 04/26/2019   MCV 100.9 (H) 04/26/2019   PLT 352 04/26/2019   NEUTROABS 3.9 04/26/2019    Imaging:  No results found.  Medications: I have reviewed the patient's current medications.  Assessment/Plan: 1. Metastatic non-small cell lung cancer  largeright pleural effusion, right lung mass, right pleural-based masses, left lung nodules  Right thoracentesis 09/04/2018-negative cytology  CT biopsy of right lateral pleural mass 09/07/2018-poorly differentiated non-small cell carcinoma, malignant cells positive for cytokeratin AE1/AE3 and cytokeratin 8/18, histology favoring adenocarcinoma, MSS, tumor mutation burden 5.no EGFR, ALK, BRAF alteration. PDL 1 rearrangement  PDL 1 tumor proportion score-100%   Cycle 1 Alimta/carboplatin 09/22/2018, pembrolizumab 09/28/2018  Cycle 2 Alimta/carboplatin/pembrolizumab 10/15/2018  Cycle 3 Alimta/carboplatin/pembrolizumab  11/06/2018  Cycle4Alimta/carboplatin/pembrolizumab 11/28/2018  CT chest 12/14/2018-response to therapy of lung primary/primaries, thoracic nodal and pulmonary metastasis. Decrease in right pleural effusion with improvement in pleural-based metastasis. New T10 heterogeneous sclerotic density likely due to healing of previously CT occult metastasis.  Pembrolizumab 12/19/2018  Pembrolizumab/Alimta starting 01/09/2019  Pembrolizumab/Alimta 01/31/2019  Pembrolizumab/Alimta 02/21/2019  Pembrolizumab/Alimta 03/14/2019  CT 04/01/2019- no evidence of disease progression, small loculated right pleural effusion and right basilar pleural nodularity-stable, stable left lower lobe nodule, persistent sclerosis at T10  Pembrolizumab/Alimta 04/05/2019   2.Severe anemia-likely secondary to bleeding in the right pleural space and metastatic carcinoma, red cell transfusions 09/09/2018 09/10/2018, 09/23/2018-improved; progressive 04/26/2019, red cell transfusion. 3.Dyspnea secondary to the large right pleural effusion and anemia  Right Pleurx catheter placed 09/11/2018  Chest x-ray 10/09/2018-large right pleural effusion, loculated  Chest x-ray 11/06/2018-no interval change in the right-sided pleural effusion  Pleurx drainage catheter removed 11/13/2018 4.History of tobacco use 5.History of kidney stones 6. Fever-most likely tumor fever, improved 7. Admission 10/08/2018 with rapid atrial fibrillation/flutter-improved with diltiazem, started on Eliquis anticoagulation. Eliquis discontinued. Now on aspirin.    Disposition: Brian Ramirez appears stable.  We reviewed the labs from today.  He has progressive anemia, hemoglobin 6.8.  We are arranging for a blood transfusion.  He will complete a set of stool cards.  The creatinine is higher, etiology unclear.  He will hold Lasix, increase fluid intake.  We are holding today's treatment.  He will return for lab and follow-up, possible treatment on 05/01/2019.   He will contact the office in the interim with any problems.  Plan reviewed with Dr. [name].  25 minutes were spent face-to-face at today's visit with the majority of that  time involved in counseling/coordination of care.    Brian Ramirez ANP/GNP-BC   04/26/2019  10:47 AM

## 2019-04-26 NOTE — Patient Instructions (Signed)

## 2019-04-26 NOTE — Progress Notes (Signed)
Per [facility], due to current blood shortage, pt will only be able to receive 1 unit PRBCs. Ned Card, NP notified and patient notified. No new orders. Patient verbalized understanding.

## 2019-04-26 NOTE — Telephone Encounter (Signed)
CRITICAL VALUE STICKER  CRITICAL VALUE: Hgb = 6.8  RECEIVER (on-site recipient of call): Cherylynn Ridges RN, Triage [place] NOTIFIED: 04/26/2019 at 1015.   MESSENGER (representative from lab): Anderson Malta MT Myles Gip  MD NOTIFIED: Collaborative for Ned Card NP.   TIME OF NOTIFICATION: 04/26/2019 at 10:21.  RESPONSE: None

## 2019-04-26 NOTE — Progress Notes (Signed)
Received critical lab call from roz HGB 6.8. Lisa aware. Per Lattie Haw no treatment today. Patient receiving 1 unit of blood today and 1 unit tomorrow 04/27/19

## 2019-04-27 ENCOUNTER — Ambulatory Visit: Payer: Medicare Other

## 2019-04-28 LAB — BPAM RBC
Blood Product Expiration Date: 202010142359
ISSUE DATE / TIME: 202009111225
Unit Type and Rh: 5100

## 2019-04-28 LAB — TYPE AND SCREEN
ABO/RH(D): O POS
Antibody Screen: NEGATIVE
Unit division: 0

## 2019-04-29 ENCOUNTER — Telehealth: Payer: Self-pay | Admitting: Nurse Practitioner

## 2019-04-29 NOTE — Telephone Encounter (Signed)
Called and left msg about 9/16 appt

## 2019-04-30 ENCOUNTER — Telehealth: Payer: Self-pay | Admitting: Nurse Practitioner

## 2019-04-30 NOTE — Telephone Encounter (Signed)
R/s appt per 9/14 sch message - unable to reach pt . Left message with new appt time

## 2019-05-01 ENCOUNTER — Inpatient Hospital Stay: Payer: Medicare Other

## 2019-05-01 ENCOUNTER — Encounter: Payer: Self-pay | Admitting: Nurse Practitioner

## 2019-05-01 ENCOUNTER — Other Ambulatory Visit: Payer: Self-pay

## 2019-05-01 ENCOUNTER — Inpatient Hospital Stay: Payer: Medicare Other | Admitting: Nurse Practitioner

## 2019-05-01 ENCOUNTER — Inpatient Hospital Stay (HOSPITAL_BASED_OUTPATIENT_CLINIC_OR_DEPARTMENT_OTHER): Payer: Medicare Other | Admitting: Nurse Practitioner

## 2019-05-01 VITALS — BP 140/85 | HR 79 | Temp 98.5°F | Resp 18 | Ht 73.0 in | Wt 149.0 lb

## 2019-05-01 DIAGNOSIS — Z79899 Other long term (current) drug therapy: Secondary | ICD-10-CM | POA: Diagnosis not present

## 2019-05-01 DIAGNOSIS — C3490 Malignant neoplasm of unspecified part of unspecified bronchus or lung: Secondary | ICD-10-CM

## 2019-05-01 DIAGNOSIS — I4891 Unspecified atrial fibrillation: Secondary | ICD-10-CM | POA: Diagnosis not present

## 2019-05-01 DIAGNOSIS — Z5112 Encounter for antineoplastic immunotherapy: Secondary | ICD-10-CM | POA: Diagnosis not present

## 2019-05-01 DIAGNOSIS — D649 Anemia, unspecified: Secondary | ICD-10-CM | POA: Diagnosis not present

## 2019-05-01 DIAGNOSIS — C3491 Malignant neoplasm of unspecified part of right bronchus or lung: Secondary | ICD-10-CM | POA: Diagnosis not present

## 2019-05-01 DIAGNOSIS — Z7901 Long term (current) use of anticoagulants: Secondary | ICD-10-CM | POA: Diagnosis not present

## 2019-05-01 LAB — CBC WITH DIFFERENTIAL (CANCER CENTER ONLY)
Abs Immature Granulocytes: 0.02 10*3/uL (ref 0.00–0.07)
Basophils Absolute: 0 10*3/uL (ref 0.0–0.1)
Basophils Relative: 0 %
Eosinophils Absolute: 0.1 10*3/uL (ref 0.0–0.5)
Eosinophils Relative: 1 %
HCT: 25.8 % — ABNORMAL LOW (ref 39.0–52.0)
Hemoglobin: 8.1 g/dL — ABNORMAL LOW (ref 13.0–17.0)
Immature Granulocytes: 0 %
Lymphocytes Relative: 13 %
Lymphs Abs: 0.7 10*3/uL (ref 0.7–4.0)
MCH: 32.3 pg (ref 26.0–34.0)
MCHC: 31.4 g/dL (ref 30.0–36.0)
MCV: 102.8 fL — ABNORMAL HIGH (ref 80.0–100.0)
Monocytes Absolute: 0.8 10*3/uL (ref 0.1–1.0)
Monocytes Relative: 16 %
Neutro Abs: 3.6 10*3/uL (ref 1.7–7.7)
Neutrophils Relative %: 70 %
Platelet Count: 262 10*3/uL (ref 150–400)
RBC: 2.51 MIL/uL — ABNORMAL LOW (ref 4.22–5.81)
RDW: 19 % — ABNORMAL HIGH (ref 11.5–15.5)
WBC Count: 5.2 10*3/uL (ref 4.0–10.5)
nRBC: 0 % (ref 0.0–0.2)

## 2019-05-01 LAB — CMP (CANCER CENTER ONLY)
ALT: 14 U/L (ref 0–44)
AST: 26 U/L (ref 15–41)
Albumin: 2.4 g/dL — ABNORMAL LOW (ref 3.5–5.0)
Alkaline Phosphatase: 91 U/L (ref 38–126)
Anion gap: 8 (ref 5–15)
BUN: 31 mg/dL — ABNORMAL HIGH (ref 8–23)
CO2: 31 mmol/L (ref 22–32)
Calcium: 9.1 mg/dL (ref 8.9–10.3)
Chloride: 99 mmol/L (ref 98–111)
Creatinine: 1.69 mg/dL — ABNORMAL HIGH (ref 0.61–1.24)
GFR, Est AFR Am: 46 mL/min — ABNORMAL LOW (ref >60)
GFR, Estimated: 40 mL/min — ABNORMAL LOW (ref >60)
Glucose, Bld: 173 mg/dL — ABNORMAL HIGH (ref 70–99)
Potassium: 4.3 mmol/L (ref 3.5–5.1)
Sodium: 138 mmol/L (ref 135–145)
Total Bilirubin: <0.2 mg/dL — ABNORMAL LOW (ref 0.3–1.2)
Total Protein: 7.3 g/dL (ref 6.5–8.1)

## 2019-05-01 LAB — URINALYSIS, COMPLETE (UACMP) WITH MICROSCOPIC
Bacteria, UA: NONE SEEN
Bilirubin Urine: NEGATIVE
Glucose, UA: NEGATIVE mg/dL
Hgb urine dipstick: NEGATIVE
Ketones, ur: NEGATIVE mg/dL
Leukocytes,Ua: NEGATIVE
Nitrite: NEGATIVE
Protein, ur: NEGATIVE mg/dL
Specific Gravity, Urine: 1.01 (ref 1.005–1.030)
pH: 6.5 (ref 5.0–8.0)

## 2019-05-01 NOTE — Progress Notes (Signed)
[place] OFFICE PROGRESS NOTE   Diagnosis: Non-small cell lung cancer  INTERVAL HISTORY:   Mr. Kattner returns as scheduled.  He completed a cycle of pembrolizumab/Alimta 04/05/2019.  Treatment was held 04/26/2019 due to anemia and elevated creatinine.  He was transfused a unit of blood.  He noted no improvement in his energy level following the blood transfusion.  He continues to have tearing from both eyes.  No nausea or vomiting.  No diarrhea.  No rash.  Stable dyspnea on exertion.  Objective:  Vital signs in last 24 hours:  Blood pressure 140/85, pulse 79, temperature 98.5 F (36.9 C), temperature source Oral, resp. rate 18, height '6\' 1"'  (1.854 m), weight 149 lb (67.6 kg), SpO2 100 %.    HEENT: Mild periorbital edema. GI: Abdomen soft and nontender.  No hepatomegaly. Vascular: No leg edema. Neuro: Alert and oriented. Skin: No rash.   Lab Results:  Lab Results  Component Value Date   WBC 5.2 05/01/2019   HGB 8.1 (L) 05/01/2019   HCT 25.8 (L) 05/01/2019   MCV 102.8 (H) 05/01/2019   PLT 262 05/01/2019   NEUTROABS 3.6 05/01/2019    Imaging:  No results found.  Medications: I have reviewed the patient's current medications.  Assessment/Plan: 1.Metastatic non-small cell lung cancer  largeright pleural effusion, right lung mass, right pleural-based masses, left lung nodules  Right thoracentesis 09/04/2018-negative cytology  CT biopsy of right lateral pleural mass 09/07/2018-poorly differentiated non-small cell carcinoma, malignant cells positive for cytokeratin AE1/AE3 and cytokeratin 8/18, histology favoring adenocarcinoma, MSS, tumor mutation burden 5.no EGFR, ALK, BRAF alteration. PDL 1 rearrangement  PDL 1 tumor proportion score-100%   Cycle 1 Alimta/carboplatin 09/22/2018, pembrolizumab 09/28/2018  Cycle 2 Alimta/carboplatin/pembrolizumab 10/15/2018  Cycle 3 Alimta/carboplatin/pembrolizumab 11/06/2018   Cycle4Alimta/carboplatin/pembrolizumab 11/28/2018  CT chest 12/14/2018-response to therapy of lung primary/primaries, thoracic nodal and pulmonary metastasis. Decrease in right pleural effusion with improvement in pleural-based metastasis. New T10 heterogeneous sclerotic density likely due to healing of previously CT occult metastasis.  Pembrolizumab 12/19/2018  Pembrolizumab/Alimta starting 01/09/2019  Pembrolizumab/Alimta 01/31/2019  Pembrolizumab/Alimta 02/21/2019  Pembrolizumab/Alimta 03/14/2019  CT 04/01/2019-no evidence of disease progression, small loculated right pleural effusion and right basilar pleural nodularity-stable, stable left lower lobe nodule, persistent sclerosis at T10  Pembrolizumab/Alimta 04/05/2019   2.Severe anemia-likely secondary to bleeding in the right pleural space and metastatic carcinoma, red cell transfusions 09/09/2018 09/10/2018, 09/23/2018-improved; progressive 04/26/2019, red cell transfusion. 3.Dyspnea secondary to the large right pleural effusion and anemia  Right Pleurx catheter placed 09/11/2018  Chest x-ray 10/09/2018-large right pleural effusion, loculated  Chest x-ray 11/06/2018-no interval change in the right-sided pleural effusion  Pleurx drainage catheter removed 11/13/2018 4.History of tobacco use 5.History of kidney stones 6. Fever-most likely tumor fever, improved 7. Admission 10/08/2018 with rapid atrial fibrillation/flutter-improved with diltiazem, started on Eliquis anticoagulation. Eliquis discontinued. Now on aspirin.   Disposition: Mr. Loftin appears stable.  We held pembrolizumab/Alimta last week due to anemia requiring transfusion support and an elevated creatinine.  Hemoglobin is better though he notes no improvement in his energy level.  Creatinine continues to be elevated.  I discussed the creatinine with the [facility] pharmacist.  We are holding both pembrolizumab and Alimta again today.  He will submit a urine  for protein.  He continues to have tearing.  We made a referral to his ophthalmologist, Dr. Claudean Kinds.  He will return for lab, follow-up, possible pembrolizumab/Alimta in 1 week.  He will contact the office in the interim with any problems.  Plan  reviewed with Dr. [name].  25 minutes were spent face-to-face at today's visit with the majority of that time involved in counseling/coordination of care.    Vinny Taranto ANP/GNP-BC   05/01/2019  10:13 AM

## 2019-05-02 ENCOUNTER — Telehealth: Payer: Self-pay | Admitting: Oncology

## 2019-05-02 NOTE — Telephone Encounter (Signed)
Called and left msg

## 2019-05-07 ENCOUNTER — Other Ambulatory Visit: Payer: Self-pay

## 2019-05-07 ENCOUNTER — Inpatient Hospital Stay (HOSPITAL_BASED_OUTPATIENT_CLINIC_OR_DEPARTMENT_OTHER): Payer: Medicare Other | Admitting: Oncology

## 2019-05-07 ENCOUNTER — Other Ambulatory Visit: Payer: Self-pay | Admitting: *Deleted

## 2019-05-07 ENCOUNTER — Inpatient Hospital Stay: Payer: Medicare Other

## 2019-05-07 VITALS — BP 149/73 | HR 77 | Temp 98.2°F | Resp 17 | Ht 73.0 in | Wt 148.8 lb

## 2019-05-07 DIAGNOSIS — I4891 Unspecified atrial fibrillation: Secondary | ICD-10-CM | POA: Diagnosis not present

## 2019-05-07 DIAGNOSIS — Z5112 Encounter for antineoplastic immunotherapy: Secondary | ICD-10-CM | POA: Diagnosis not present

## 2019-05-07 DIAGNOSIS — C3490 Malignant neoplasm of unspecified part of unspecified bronchus or lung: Secondary | ICD-10-CM

## 2019-05-07 DIAGNOSIS — Z7901 Long term (current) use of anticoagulants: Secondary | ICD-10-CM | POA: Diagnosis not present

## 2019-05-07 DIAGNOSIS — Z79899 Other long term (current) drug therapy: Secondary | ICD-10-CM | POA: Diagnosis not present

## 2019-05-07 DIAGNOSIS — D649 Anemia, unspecified: Secondary | ICD-10-CM | POA: Diagnosis not present

## 2019-05-07 DIAGNOSIS — C3491 Malignant neoplasm of unspecified part of right bronchus or lung: Secondary | ICD-10-CM | POA: Diagnosis not present

## 2019-05-07 LAB — CMP (CANCER CENTER ONLY)
ALT: 10 U/L (ref 0–44)
AST: 25 U/L (ref 15–41)
Albumin: 2.7 g/dL — ABNORMAL LOW (ref 3.5–5.0)
Alkaline Phosphatase: 86 U/L (ref 38–126)
Anion gap: 11 (ref 5–15)
BUN: 39 mg/dL — ABNORMAL HIGH (ref 8–23)
CO2: 30 mmol/L (ref 22–32)
Calcium: 9.6 mg/dL (ref 8.9–10.3)
Chloride: 97 mmol/L — ABNORMAL LOW (ref 98–111)
Creatinine: 2.01 mg/dL — ABNORMAL HIGH (ref 0.61–1.24)
GFR, Est AFR Am: 37 mL/min — ABNORMAL LOW (ref >60)
GFR, Estimated: 32 mL/min — ABNORMAL LOW (ref >60)
Glucose, Bld: 78 mg/dL (ref 70–99)
Potassium: 4.1 mmol/L (ref 3.5–5.1)
Sodium: 138 mmol/L (ref 135–145)
Total Bilirubin: 0.3 mg/dL (ref 0.3–1.2)
Total Protein: 7.9 g/dL (ref 6.5–8.1)

## 2019-05-07 LAB — URINALYSIS, COMPLETE (UACMP) WITH MICROSCOPIC
Bilirubin Urine: NEGATIVE
Glucose, UA: NEGATIVE mg/dL
Hgb urine dipstick: NEGATIVE
Ketones, ur: NEGATIVE mg/dL
Leukocytes,Ua: NEGATIVE
Nitrite: NEGATIVE
Protein, ur: NEGATIVE mg/dL
Specific Gravity, Urine: 1.006 (ref 1.005–1.030)
pH: 7 (ref 5.0–8.0)

## 2019-05-07 LAB — OCCULT BLOOD X 1 CARD TO LAB, STOOL
Fecal Occult Bld: NEGATIVE
Fecal Occult Bld: NEGATIVE
Fecal Occult Bld: NEGATIVE

## 2019-05-07 LAB — CBC WITH DIFFERENTIAL (CANCER CENTER ONLY)
Abs Immature Granulocytes: 0.02 10*3/uL (ref 0.00–0.07)
Basophils Absolute: 0 10*3/uL (ref 0.0–0.1)
Basophils Relative: 1 %
Eosinophils Absolute: 0.1 10*3/uL (ref 0.0–0.5)
Eosinophils Relative: 2 %
HCT: 26.8 % — ABNORMAL LOW (ref 39.0–52.0)
Hemoglobin: 8.4 g/dL — ABNORMAL LOW (ref 13.0–17.0)
Immature Granulocytes: 0 %
Lymphocytes Relative: 14 %
Lymphs Abs: 0.8 10*3/uL (ref 0.7–4.0)
MCH: 32.4 pg (ref 26.0–34.0)
MCHC: 31.3 g/dL (ref 30.0–36.0)
MCV: 103.5 fL — ABNORMAL HIGH (ref 80.0–100.0)
Monocytes Absolute: 1 10*3/uL (ref 0.1–1.0)
Monocytes Relative: 18 %
Neutro Abs: 3.4 10*3/uL (ref 1.7–7.7)
Neutrophils Relative %: 65 %
Platelet Count: 240 10*3/uL (ref 150–400)
RBC: 2.59 MIL/uL — ABNORMAL LOW (ref 4.22–5.81)
RDW: 17.7 % — ABNORMAL HIGH (ref 11.5–15.5)
WBC Count: 5.3 10*3/uL (ref 4.0–10.5)
nRBC: 0 % (ref 0.0–0.2)

## 2019-05-07 LAB — SODIUM, URINE, RANDOM: Sodium, Ur: 38 mmol/L

## 2019-05-07 LAB — OSMOLALITY, URINE: Osmolality, Ur: 271 mOsm/kg — ABNORMAL LOW (ref 300–900)

## 2019-05-07 MED ORDER — SODIUM CHLORIDE 0.9 % IV SOLN
200.0000 mg | Freq: Once | INTRAVENOUS | Status: AC
  Administered 2019-05-07: 200 mg via INTRAVENOUS
  Filled 2019-05-07: qty 8

## 2019-05-07 MED ORDER — CYANOCOBALAMIN 1000 MCG/ML IJ SOLN
1000.0000 ug | Freq: Once | INTRAMUSCULAR | Status: AC
  Administered 2019-05-07: 12:00:00 1000 ug via INTRAMUSCULAR

## 2019-05-07 MED ORDER — CYANOCOBALAMIN 1000 MCG/ML IJ SOLN
INTRAMUSCULAR | Status: AC
  Filled 2019-05-07: qty 1

## 2019-05-07 MED ORDER — SODIUM CHLORIDE 0.9 % IV SOLN
Freq: Once | INTRAVENOUS | Status: AC
  Administered 2019-05-07: 11:00:00 via INTRAVENOUS
  Filled 2019-05-07: qty 250

## 2019-05-07 NOTE — Progress Notes (Signed)
Keytruda only today per Dr [name]. B-12 to be given

## 2019-05-07 NOTE — Progress Notes (Signed)
Proceed with treatment today with Scr 2.01 per MD. MD aware of increasing Scr. Will hold Alimta today.  Hardie Pulley, PharmD, BCPS, BCOP

## 2019-05-07 NOTE — Progress Notes (Signed)
[place] OFFICE PROGRESS NOTE   Diagnosis: Non-small cell lung cancer  INTERVAL HISTORY:   Brian Ramirez returns as scheduled.  He has malaise.  No significant dyspnea.  No pain.  No bleeding.  No rash or diarrhea.  He is not taking Lasix.  Objective:  Vital signs in last 24 hours:  Blood pressure (!) 149/73, pulse 77, temperature 98.2 F (36.8 C), temperature source Oral, resp. rate 17, height '6\' 1"'  (1.854 m), weight 148 lb 12.8 oz (67.5 kg), SpO2 98 %.    HEENT: Mucous membranes are moist.  No buccal thrush. Resp: Decreased breath sounds with coarse rhonchi throughout the right chest, no respiratory distress Cardio: Regular rate and rhythm GI: No hepatosplenomegaly Vascular: No leg edema     Lab Results:  Lab Results  Component Value Date   WBC 5.3 05/07/2019   HGB 8.4 (L) 05/07/2019   HCT 26.8 (L) 05/07/2019   MCV 103.5 (H) 05/07/2019   PLT 240 05/07/2019   NEUTROABS 3.4 05/07/2019    CMP  Lab Results  Component Value Date   NA 138 05/07/2019   K 4.1 05/07/2019   CL 97 (L) 05/07/2019   CO2 30 05/07/2019   GLUCOSE 78 05/07/2019   BUN 39 (H) 05/07/2019   CREATININE 2.01 (H) 05/07/2019   CALCIUM 9.6 05/07/2019   PROT 7.9 05/07/2019   ALBUMIN 2.7 (L) 05/07/2019   AST 25 05/07/2019   ALT 10 05/07/2019   ALKPHOS 86 05/07/2019   BILITOT 0.3 05/07/2019   GFRNONAA 32 (L) 05/07/2019   GFRAA 37 (L) 05/07/2019   Urinalysis: 0-5 red cells, 0-5 white cells, specific gravity 1.006, protein negative  Medications: I have reviewed the patient's current medications.   Assessment/Plan: 1.Metastatic non-small cell lung cancer  largeright pleural effusion, right lung mass, right pleural-based masses, left lung nodules  Right thoracentesis 09/04/2018-negative cytology  CT biopsy of right lateral pleural mass 09/07/2018-poorly differentiated non-small cell carcinoma, malignant cells positive for cytokeratin AE1/AE3 and cytokeratin 8/18, histology  favoring adenocarcinoma, MSS, tumor mutation burden 5.no EGFR, ALK, BRAF alteration. PDL 1 rearrangement  PDL 1 tumor proportion score-100%   Cycle 1 Alimta/carboplatin 09/22/2018, pembrolizumab 09/28/2018  Cycle 2 Alimta/carboplatin/pembrolizumab 10/15/2018  Cycle 3 Alimta/carboplatin/pembrolizumab 11/06/2018  Cycle4Alimta/carboplatin/pembrolizumab 11/28/2018  CT chest 12/14/2018-response to therapy of lung primary/primaries, thoracic nodal and pulmonary metastasis. Decrease in right pleural effusion with improvement in pleural-based metastasis. New T10 heterogeneous sclerotic density likely due to healing of previously CT occult metastasis.  Pembrolizumab 12/19/2018  Pembrolizumab/Alimta starting 01/09/2019  Pembrolizumab/Alimta 01/31/2019  Pembrolizumab/Alimta 02/21/2019  Pembrolizumab/Alimta 03/14/2019  CT 04/01/2019-no evidence of disease progression, small loculated right pleural effusion and right basilar pleural nodularity-stable, stable left lower lobe nodule, persistent sclerosis at T10  Pembrolizumab/Alimta 04/05/2019  Pembrolizumab 05/07/2019   2.Severe anemia-likely secondary to bleeding in the right pleural space and metastatic carcinoma, red cell transfusions 09/09/2018 09/10/2018, 09/23/2018-improved; progressive 04/26/2019, red cell transfusion. 3.Dyspnea secondary to the large right pleural effusion and anemia  Right Pleurx catheter placed 09/11/2018  Chest x-ray 10/09/2018-large right pleural effusion, loculated  Chest x-ray 11/06/2018-no interval change in the right-sided pleural effusion  Pleurx drainage catheter removed 11/13/2018 4.History of tobacco use 5.History of kidney stones 6. Fever-most likely tumor fever, improved 7. Admission 10/08/2018 with rapid atrial fibrillation/flutter-improved with diltiazem, started on Eliquis anticoagulation. Eliquis discontinued. Now on aspirin. 8.  Renal insufficiency    Disposition: [name] appears well.   The urinalysis is not suggestive of nephritis.  I suspect the elevated creatinine is related  to Alimta or another etiology.  He reports a dry mouth and drinking lots of liquids.  He has frequent urination.  He could have diabetes insipidus related to pembrolizumab.  We will check a cortisol level.  He will return for a lab visit in 10 days and an office visit in 3 weeks.  Betsy Coder, MD  05/07/2019  10:18 AM

## 2019-05-07 NOTE — Patient Instructions (Signed)

## 2019-05-08 ENCOUNTER — Other Ambulatory Visit: Payer: Self-pay | Admitting: *Deleted

## 2019-05-08 DIAGNOSIS — C3490 Malignant neoplasm of unspecified part of unspecified bronchus or lung: Secondary | ICD-10-CM

## 2019-05-09 ENCOUNTER — Telehealth: Payer: Self-pay | Admitting: Oncology

## 2019-05-09 NOTE — Telephone Encounter (Signed)
Called and left msg. Mailed printout  °

## 2019-05-23 ENCOUNTER — Telehealth: Payer: Self-pay | Admitting: *Deleted

## 2019-05-23 ENCOUNTER — Inpatient Hospital Stay: Payer: Medicare Other | Attending: Oncology

## 2019-05-23 ENCOUNTER — Other Ambulatory Visit: Payer: Self-pay

## 2019-05-23 DIAGNOSIS — Z23 Encounter for immunization: Secondary | ICD-10-CM | POA: Insufficient documentation

## 2019-05-23 DIAGNOSIS — Z79899 Other long term (current) drug therapy: Secondary | ICD-10-CM | POA: Insufficient documentation

## 2019-05-23 DIAGNOSIS — D649 Anemia, unspecified: Secondary | ICD-10-CM | POA: Insufficient documentation

## 2019-05-23 DIAGNOSIS — C3490 Malignant neoplasm of unspecified part of unspecified bronchus or lung: Secondary | ICD-10-CM | POA: Diagnosis not present

## 2019-05-23 DIAGNOSIS — C782 Secondary malignant neoplasm of pleura: Secondary | ICD-10-CM | POA: Diagnosis not present

## 2019-05-23 DIAGNOSIS — Z5112 Encounter for antineoplastic immunotherapy: Secondary | ICD-10-CM | POA: Insufficient documentation

## 2019-05-23 LAB — CBC WITH DIFFERENTIAL (CANCER CENTER ONLY)
Abs Immature Granulocytes: 0.01 10*3/uL (ref 0.00–0.07)
Basophils Absolute: 0 10*3/uL (ref 0.0–0.1)
Basophils Relative: 1 %
Eosinophils Absolute: 0.1 10*3/uL (ref 0.0–0.5)
Eosinophils Relative: 2 %
HCT: 31.1 % — ABNORMAL LOW (ref 39.0–52.0)
Hemoglobin: 9.9 g/dL — ABNORMAL LOW (ref 13.0–17.0)
Immature Granulocytes: 0 %
Lymphocytes Relative: 13 %
Lymphs Abs: 0.8 10*3/uL (ref 0.7–4.0)
MCH: 32.5 pg (ref 26.0–34.0)
MCHC: 31.8 g/dL (ref 30.0–36.0)
MCV: 102 fL — ABNORMAL HIGH (ref 80.0–100.0)
Monocytes Absolute: 0.9 10*3/uL (ref 0.1–1.0)
Monocytes Relative: 13 %
Neutro Abs: 4.6 10*3/uL (ref 1.7–7.7)
Neutrophils Relative %: 71 %
Platelet Count: 247 10*3/uL (ref 150–400)
RBC: 3.05 MIL/uL — ABNORMAL LOW (ref 4.22–5.81)
RDW: 14.5 % (ref 11.5–15.5)
WBC Count: 6.5 10*3/uL (ref 4.0–10.5)
nRBC: 0 % (ref 0.0–0.2)

## 2019-05-23 LAB — BASIC METABOLIC PANEL - CANCER CENTER ONLY
Anion gap: 10 (ref 5–15)
BUN: 38 mg/dL — ABNORMAL HIGH (ref 8–23)
CO2: 29 mmol/L (ref 22–32)
Calcium: 9.6 mg/dL (ref 8.9–10.3)
Chloride: 98 mmol/L (ref 98–111)
Creatinine: 1.75 mg/dL — ABNORMAL HIGH (ref 0.61–1.24)
GFR, Est AFR Am: 44 mL/min — ABNORMAL LOW (ref >60)
GFR, Estimated: 38 mL/min — ABNORMAL LOW (ref >60)
Glucose, Bld: 80 mg/dL (ref 70–99)
Potassium: 4.3 mmol/L (ref 3.5–5.1)
Sodium: 137 mmol/L (ref 135–145)

## 2019-05-23 LAB — SAMPLE TO BLOOD BANK

## 2019-05-23 NOTE — Telephone Encounter (Signed)
-----   Message from Ladell Pier, MD sent at 05/23/2019  1:52 PM EDT ----- Please call patient, kidney function and hb are better, f/u as scheduled

## 2019-05-23 NOTE — Telephone Encounter (Signed)
Notified patient of improvement in renal functions and Hgb. F/U as scheduled per MD. He is asking if he needs repeat labs for his treatment on 10/13? Per Dr. [name]: Needs CBC and CMP on 10/13. Scheduling message sent.

## 2019-05-26 ENCOUNTER — Other Ambulatory Visit: Payer: Self-pay | Admitting: Oncology

## 2019-05-28 ENCOUNTER — Encounter: Payer: Self-pay | Admitting: Nurse Practitioner

## 2019-05-28 ENCOUNTER — Inpatient Hospital Stay: Payer: Medicare Other

## 2019-05-28 ENCOUNTER — Inpatient Hospital Stay (HOSPITAL_BASED_OUTPATIENT_CLINIC_OR_DEPARTMENT_OTHER): Payer: Medicare Other | Admitting: Nurse Practitioner

## 2019-05-28 ENCOUNTER — Other Ambulatory Visit: Payer: Self-pay

## 2019-05-28 VITALS — BP 150/84 | HR 76 | Temp 98.7°F | Resp 18 | Ht 73.0 in | Wt 146.0 lb

## 2019-05-28 DIAGNOSIS — Z23 Encounter for immunization: Secondary | ICD-10-CM

## 2019-05-28 DIAGNOSIS — C782 Secondary malignant neoplasm of pleura: Secondary | ICD-10-CM | POA: Diagnosis not present

## 2019-05-28 DIAGNOSIS — C3490 Malignant neoplasm of unspecified part of unspecified bronchus or lung: Secondary | ICD-10-CM | POA: Diagnosis not present

## 2019-05-28 DIAGNOSIS — Z79899 Other long term (current) drug therapy: Secondary | ICD-10-CM | POA: Diagnosis not present

## 2019-05-28 DIAGNOSIS — R5383 Other fatigue: Secondary | ICD-10-CM | POA: Diagnosis not present

## 2019-05-28 DIAGNOSIS — Z5112 Encounter for antineoplastic immunotherapy: Secondary | ICD-10-CM | POA: Diagnosis not present

## 2019-05-28 DIAGNOSIS — D649 Anemia, unspecified: Secondary | ICD-10-CM | POA: Diagnosis not present

## 2019-05-28 LAB — CBC WITH DIFFERENTIAL (CANCER CENTER ONLY)
Abs Immature Granulocytes: 0.01 10*3/uL (ref 0.00–0.07)
Basophils Absolute: 0 10*3/uL (ref 0.0–0.1)
Basophils Relative: 0 %
Eosinophils Absolute: 0.1 10*3/uL (ref 0.0–0.5)
Eosinophils Relative: 2 %
HCT: 29.9 % — ABNORMAL LOW (ref 39.0–52.0)
Hemoglobin: 9.4 g/dL — ABNORMAL LOW (ref 13.0–17.0)
Immature Granulocytes: 0 %
Lymphocytes Relative: 14 %
Lymphs Abs: 0.9 10*3/uL (ref 0.7–4.0)
MCH: 32 pg (ref 26.0–34.0)
MCHC: 31.4 g/dL (ref 30.0–36.0)
MCV: 101.7 fL — ABNORMAL HIGH (ref 80.0–100.0)
Monocytes Absolute: 0.7 10*3/uL (ref 0.1–1.0)
Monocytes Relative: 11 %
Neutro Abs: 4.4 10*3/uL (ref 1.7–7.7)
Neutrophils Relative %: 73 %
Platelet Count: 203 10*3/uL (ref 150–400)
RBC: 2.94 MIL/uL — ABNORMAL LOW (ref 4.22–5.81)
RDW: 13.6 % (ref 11.5–15.5)
WBC Count: 6.1 10*3/uL (ref 4.0–10.5)
nRBC: 0 % (ref 0.0–0.2)

## 2019-05-28 LAB — CMP (CANCER CENTER ONLY)
ALT: 10 U/L (ref 0–44)
AST: 23 U/L (ref 15–41)
Albumin: 2.9 g/dL — ABNORMAL LOW (ref 3.5–5.0)
Alkaline Phosphatase: 93 U/L (ref 38–126)
Anion gap: 10 (ref 5–15)
BUN: 35 mg/dL — ABNORMAL HIGH (ref 8–23)
CO2: 29 mmol/L (ref 22–32)
Calcium: 9.5 mg/dL (ref 8.9–10.3)
Chloride: 99 mmol/L (ref 98–111)
Creatinine: 1.76 mg/dL — ABNORMAL HIGH (ref 0.61–1.24)
GFR, Est AFR Am: 44 mL/min — ABNORMAL LOW (ref >60)
GFR, Estimated: 38 mL/min — ABNORMAL LOW (ref >60)
Glucose, Bld: 98 mg/dL (ref 70–99)
Potassium: 4.4 mmol/L (ref 3.5–5.1)
Sodium: 138 mmol/L (ref 135–145)
Total Bilirubin: 0.3 mg/dL (ref 0.3–1.2)
Total Protein: 8 g/dL (ref 6.5–8.1)

## 2019-05-28 MED ORDER — SODIUM CHLORIDE 0.9 % IV SOLN
200.0000 mg | Freq: Once | INTRAVENOUS | Status: AC
  Administered 2019-05-28: 200 mg via INTRAVENOUS
  Filled 2019-05-28: qty 8

## 2019-05-28 MED ORDER — INFLUENZA VAC A&B SA ADJ QUAD 0.5 ML IM PRSY
PREFILLED_SYRINGE | INTRAMUSCULAR | Status: AC
  Filled 2019-05-28: qty 0.5

## 2019-05-28 MED ORDER — SODIUM CHLORIDE 0.9 % IV SOLN
Freq: Once | INTRAVENOUS | Status: AC
  Administered 2019-05-28: 12:00:00 via INTRAVENOUS
  Filled 2019-05-28: qty 250

## 2019-05-28 MED ORDER — INFLUENZA VAC A&B SA ADJ QUAD 0.5 ML IM PRSY
0.5000 mL | PREFILLED_SYRINGE | Freq: Once | INTRAMUSCULAR | Status: AC
  Administered 2019-05-28: 13:00:00 0.5 mL via INTRAMUSCULAR

## 2019-05-28 MED ORDER — SODIUM CHLORIDE 0.9% FLUSH
10.0000 mL | INTRAVENOUS | Status: DC | PRN
  Filled 2019-05-28: qty 10

## 2019-05-28 MED ORDER — HEPARIN SOD (PORK) LOCK FLUSH 100 UNIT/ML IV SOLN
500.0000 [IU] | Freq: Once | INTRAVENOUS | Status: DC | PRN
  Filled 2019-05-28: qty 5

## 2019-05-28 NOTE — Progress Notes (Signed)
OK to treat today with Keytruda but no Alimta  Per Ned Card NP.  Order repeated & verified.

## 2019-05-28 NOTE — Patient Instructions (Signed)
[facility] Discharge Instructions for Patients Receiving Chemotherapy  Today you received the following Immunotherapy: Pembrolizumab  To help prevent nausea and vomiting after your treatment, we encourage you to take your nausea medication as directed by your MD.   If you develop nausea and vomiting that is not controlled by your nausea medication, call the clinic.   BELOW ARE SYMPTOMS THAT SHOULD BE REPORTED IMMEDIATELY:  *FEVER GREATER THAN 100.5 F  *CHILLS WITH OR WITHOUT FEVER  NAUSEA AND VOMITING THAT IS NOT CONTROLLED WITH YOUR NAUSEA MEDICATION  *UNUSUAL SHORTNESS OF BREATH  *UNUSUAL BRUISING OR BLEEDING  TENDERNESS IN MOUTH AND THROAT WITH OR WITHOUT PRESENCE OF ULCERS  *URINARY PROBLEMS  *BOWEL PROBLEMS  UNUSUAL RASH Items with * indicate a potential emergency and should be followed up as soon as possible.  Feel free to call the clinic should you have any questions or concerns. The clinic phone number is [phone].  Please show the [facility] at check-in to the Emergency Department and triage nurse. Coronavirus (COVID-19) Are you at risk?  Are you at risk for the Coronavirus (COVID-19)?  To be considered HIGH RISK for Coronavirus (COVID-19), you have to meet the following criteria:  . Traveled to [place], [place], [place], [place] or [place]; or in the [place] to [place], [place], [place], or [place]; and have fever, cough, and shortness of breath within the last 2 weeks of travel OR . Been in close contact with a person diagnosed with COVID-19 within the last 2 weeks and have fever, cough, and shortness of breath . IF YOU DO NOT MEET THESE CRITERIA, YOU ARE CONSIDERED LOW RISK FOR COVID-19.  What to do if you are HIGH RISK for COVID-19?  Marland Kitchen If you are having a medical emergency, call 911. . Seek medical care right away. Before you go to a doctor's office, urgent care or emergency department, call ahead and tell them about  your recent travel, contact with someone diagnosed with COVID-19, and your symptoms. You should receive instructions from your physician's office regarding next steps of care.  . When you arrive at healthcare provider, tell the healthcare staff immediately you have returned from visiting [place], [place], [place], [place] or [place]; or traveled in the [place] to [place], [place], [place], or [place]; in the last two weeks or you have been in close contact with a person diagnosed with COVID-19 in the last 2 weeks.   . Tell the health care staff about your symptoms: fever, cough and shortness of breath. . After you have been seen by a medical provider, you will be either: o Tested for (COVID-19) and discharged home on quarantine except to seek medical care if symptoms worsen, and asked to  - Stay home and avoid contact with others until you get your results (4-5 days)  - Avoid travel on public transportation if possible (such as bus, train, or airplane) or o Sent to the Emergency Department by EMS for evaluation, COVID-19 testing, and possible admission depending on your condition and test results.  What to do if you are LOW RISK for COVID-19?  Reduce your risk of any infection by using the same precautions used for avoiding the common cold or flu:  Marland Kitchen Wash your hands often with soap and warm water for at least 20 seconds.  If soap and water are not readily available, use an alcohol-based hand sanitizer with at least 60% alcohol.  . If coughing or  sneezing, cover your mouth and nose by coughing or sneezing into the elbow areas of your shirt or coat, into a tissue or into your sleeve (not your hands). . Avoid shaking hands with others and consider head nods or verbal greetings only. . Avoid touching your eyes, nose, or mouth with unwashed hands.  . Avoid close contact with people who are sick. . Avoid places or events with large numbers of people in one location, like concerts or sporting  events. . Carefully consider travel plans you have or are making. . If you are planning any travel outside or inside the [place], visit the CDC's Travelers' Health webpage for the latest health notices. . If you have some symptoms but not all symptoms, continue to monitor at home and seek medical attention if your symptoms worsen. . If you are having a medical emergency, call 911.   [facility] / e-Visit: [email or web address]         [facility]: [facility]: [phone]                   [facility]: [phone]

## 2019-05-28 NOTE — Progress Notes (Signed)
[facility] OFFICE PROGRESS NOTE   Diagnosis: Non-small cell lung cancer  INTERVAL HISTORY:   Brian Ramirez returns as scheduled.  He completed a cycle of pembrolizumab 05/07/2019.  He continues to note tearing.  Eyes are crusted each morning.  He has not seen the eye doctor.  No diarrhea.  No skin rash.  He denies shortness of breath.  No fever.  He has recently noted mucus in the morning hours.  Objective:  Vital signs in last 24 hours:  Blood pressure (!) 150/84, pulse 76, temperature 98.7 F (37.1 C), resp. rate 18, height '6\' 1"'  (1.854 m), weight 146 lb (66.2 kg), SpO2 100 %.   HEENT: No conjunctival erythema. GI: Abdomen soft and nontender.  No hepatomegaly. Vascular: No leg edema. Neuro: Alert and oriented. Skin: No rash.   Lab Results:  Lab Results  Component Value Date   WBC 6.1 05/28/2019   HGB 9.4 (L) 05/28/2019   HCT 29.9 (L) 05/28/2019   MCV 101.7 (H) 05/28/2019   PLT 203 05/28/2019   NEUTROABS 4.4 05/28/2019    Imaging:  No results found.  Medications: I have reviewed the patient's current medications.  Assessment/Plan: 1.Metastatic non-small cell lung cancer  largeright pleural effusion, right lung mass, right pleural-based masses, left lung nodules  Right thoracentesis 09/04/2018-negative cytology  CT biopsy of right lateral pleural mass 09/07/2018-poorly differentiated non-small cell carcinoma, malignant cells positive for cytokeratin AE1/AE3 and cytokeratin 8/18, histology favoring adenocarcinoma, MSS, tumor mutation burden 5.no EGFR, ALK, BRAF alteration. PDL 1 rearrangement  PDL 1 tumor proportion score-100%   Cycle 1 Alimta/carboplatin 09/22/2018, pembrolizumab 09/28/2018  Cycle 2 Alimta/carboplatin/pembrolizumab 10/15/2018  Cycle 3 Alimta/carboplatin/pembrolizumab 11/06/2018  Cycle4Alimta/carboplatin/pembrolizumab 11/28/2018  CT chest 12/14/2018-response to therapy of lung primary/primaries, thoracic nodal and pulmonary  metastasis. Decrease in right pleural effusion with improvement in pleural-based metastasis. New T10 heterogeneous sclerotic density likely due to healing of previously CT occult metastasis.  Pembrolizumab 12/19/2018  Pembrolizumab/Alimta starting 01/09/2019  Pembrolizumab/Alimta 01/31/2019  Pembrolizumab/Alimta 02/21/2019  Pembrolizumab/Alimta 03/14/2019  CT 04/01/2019-no evidence of disease progression, small loculated right pleural effusion and right basilar pleural nodularity-stable, stable left lower lobe nodule, persistent sclerosis at T10  Pembrolizumab/Alimta 04/05/2019  Pembrolizumab 05/07/2019  Pembrolizumab 05/28/2019   2.Severe anemia-likely secondary to bleeding in the right pleural space and metastatic carcinoma, red cell transfusions 09/09/2018 09/10/2018, 09/23/2018-improved;progressive 04/26/2019, red cell transfusion. 3.Dyspnea secondary to the large right pleural effusion and anemia  Right Pleurx catheter placed 09/11/2018  Chest x-ray 10/09/2018-large right pleural effusion, loculated  Chest x-ray 11/06/2018-no interval change in the right-sided pleural effusion  Pleurx drainage catheter removed 11/13/2018 4.History of tobacco use 5.History of kidney stones 6. Fever-most likely tumor fever, improved 7. Admission 10/08/2018 with rapid atrial fibrillation/flutter-improved with diltiazem, started on Eliquis anticoagulation. Eliquis discontinued. Now on aspirin. 8.  Renal insufficiency   Disposition: Brian Ramirez appears stable.  There is no clinical evidence of disease progression.  Plan to proceed with pembrolizumab today as scheduled.  Continue to hold Alimta due to the elevated creatinine.  We reviewed the labs from today, adequate to proceed with pembrolizumab.  We will follow-up on the ophthalmology referral made 05/01/2019.  He will return for lab, follow-up, pembrolizumab in 3 weeks.  He will contact the office in the interim with any problems.   Patient seen with Dr. [name].    Brian Ramirez ANP/GNP-BC   05/28/2019  11:22 AM  This was a shared visit with Brian Ramirez.  The creatinine remains elevated.  Alimta will remain on  hold.  The plan is to continue pembrolizumab.  We will schedule a restaging chest CT next 1-2 months.  Julieanne Manson, MD

## 2019-05-30 ENCOUNTER — Telehealth: Payer: Self-pay | Admitting: *Deleted

## 2019-05-30 NOTE — Telephone Encounter (Signed)
Called office to f/u on referral placed on 05/01/2019. Confirmed they now have the referral and records. She will call him today for appointment.

## 2019-06-14 ENCOUNTER — Telehealth (INDEPENDENT_AMBULATORY_CARE_PROVIDER_SITE_OTHER): Payer: Medicare Other | Admitting: Cardiology

## 2019-06-14 ENCOUNTER — Encounter: Payer: Self-pay | Admitting: Cardiology

## 2019-06-14 ENCOUNTER — Encounter: Payer: Self-pay | Admitting: *Deleted

## 2019-06-14 ENCOUNTER — Other Ambulatory Visit: Payer: Self-pay

## 2019-06-14 DIAGNOSIS — I480 Paroxysmal atrial fibrillation: Secondary | ICD-10-CM

## 2019-06-14 DIAGNOSIS — D638 Anemia in other chronic diseases classified elsewhere: Secondary | ICD-10-CM

## 2019-06-14 DIAGNOSIS — Z7901 Long term (current) use of anticoagulants: Secondary | ICD-10-CM

## 2019-06-14 DIAGNOSIS — R600 Localized edema: Secondary | ICD-10-CM

## 2019-06-14 MED ORDER — DILTIAZEM HCL ER COATED BEADS 120 MG PO CP24: 120.0000 mg | ORAL_CAPSULE | Freq: Every day | ORAL | 3 refills | Status: DC

## 2019-06-14 NOTE — Progress Notes (Signed)
Virtual Visit via Video Note   This visit type was conducted due to national recommendations for restrictions regarding the COVID-19 Pandemic (e.g. social distancing) in an effort to limit this patient's exposure and mitigate transmission in our community.  Due to his co-morbid illnesses, this patient is at least at moderate risk for complications without adequate follow up.  This format is felt to be most appropriate for this patient at this time.  All issues noted in this document were discussed and addressed.  A limited physical exam was performed with this format.  Please refer to the patient's chart for his consent to telehealth for [facility].   Evaluation Performed:  Follow-up visit  Date:  06/14/2019   ID:  Brian Ramirez, Brian Ramirez 1946-06-06, MRN [id number]  Patient Location: Home Provider Location: Home  PCP:  [name], MD  Cardiologist:  [name], MD   Reason for visit 3 months follow up  History of Present Illness:    Brian Ramirez is a 73 y.o. male with with history of metastatic non-small cell lung CA, right pleural effusion status post Pleurx catheter, anemia was found to have new onset atrial flutter with RVR during blood transfusion for anemia 10/11/2018.  2D echo normal LVEF 55% left atrium was moderately dilated.  Patient was placed on diltiazem and amiodarone initially with plans not to use long-term because of lung cancer. The plan was to d/c amiodarone at the follow up visit if he remains in SR.  Discharged on Eliquis.  ChadsVasc equals 1 but with his cancer he may be at increased risk of stroke. He did have a bloody pleural effusion.  If he continues to bleed recommend aspirin 81 mg daily.  Lower extremity edema improved with Lasix.  He was seen by [name] on 10/14/2018, he was in normal sinus rhythm.  Dr. [name] decreased his Lasix to once a day and he has had no further swelling.  12/03/2018 - 3 months follow up, he continues with  chemotherapy - feels tired after it but otherwise feels very well, improved energy and SOB. He has significant nosebleeds- on home O2 and nose bleeds with every blowing. No CP, palpitations, dizziness, no DOE.   03/07/2019 - 3 months follow up, the patient continues to be treated for metastatic non-small cell lung cancer with largeright pleural effusion, right lung mass, right pleural-based masses, left lung nodules. Chest CT 12/14/2018 showed response to therapy of lung primary/primaries, thoracic nodal and pulmonary metastasis. Decrease in right pleural effusion with improvement in pleural-based metastasis. New T10 heterogeneous sclerotic density likely due to healing of previously CT occult metastasis. He is on therapy with Pembrolizumab. He has severe anemia-likely secondary to bleeding in the right pleural space and metastatic carcinoma, red cell transfusions 09/09/2018 09/10/2018, 09/23/2018-improved, most recent Hb 9.4 on 02/21/2019.  He is having on and off lower extremity edema only in his left leg that is chronic and goes away by the morning.  He feels occasional palpitations with regards to extra beats.  No longer palpitations no dizziness or syncope.  No chest pain.  06/14/2019 -the patient develop acute kidney failure believed to be secondary to chemotherapy that was discontinued, his creatinine went up from 0.9-2.0 now improved to 1.7, he states that his lower extremity edema has resolved, he has no orthopnea or proximal nocturnal dyspnea.  He denies any chest pain and has stable dyspnea on exertion he continues to use 24/7 home oxygen.  In the last 3 months he only  had one episode of palpitations in August that started at night his pulse ox showed a heart rate of 150 he went to bed and woke up with a heart rate of 70.  No other episodes since then.  No bleeding.  The patient does not have symptoms concerning for COVID-19 infection (fever, chills, cough, or new shortness of breath).   Past  Medical History:  Diagnosis Date  . Acute congestive heart failure ([place])   . Anemia of chronic disease 10/08/2018  . Anticoagulated   . Atrial flutter with rapid ventricular response ([place]) 10/08/2018  . Difficult airway for intubation 09/26/2016  . Edema of both lower extremities 10/09/2018  . Goals of care, counseling/discussion 09/20/2018  . Hernia 2012  . History of hiatal hernia   . History of kidney stones   . Left inguinal hernia s/p lap repair 11/22/2016 09/26/2016  . Lung cancer ([place]) 09/20/2018  . Lung mass 09/04/2018  . Malnutrition of moderate degree 09/20/2018  . Mass of right lung 09/04/2018  . New onset atrial fibrillation ([place])   . Personal history of kidney stones 1992  . Pleural effusion 09/04/2018  . Pleural effusion on right   . Pressure injury of skin 09/20/2018  . SOB (shortness of breath) 09/19/2018   Past Surgical History:  Procedure Laterality Date  . HERNIA REPAIR  2012   inguinal  . INGUINAL HERNIA REPAIR Left 11/22/2016   Procedure: LAPAROSCOPIC  REPAIR OF LEFT INGUINAL HERNIA WITH MESH;  Surgeon: [name], MD;  Location: [facility]S;  Service: General;  Laterality: Left;  . IR INSTILL VIA CHEST TUBE AGENT FOR FIBRINOLYSIS INI DAY  10/10/2018  . IR INSTILL VIA CHEST TUBE AGENT FOR FIBRINOLYSIS INI DAY  10/31/2018  . IR PERC PLEURAL DRAIN W/INDWELL CATH W/IMG GUIDE  09/11/2018  . IR REMOVAL OF PLURAL CATH W/CUFF  11/13/2018     Current Meds  Medication Sig  . Artificial Tear Ointment (DRY EYES OP) Apply 1 drop to eye as needed. Dry, itching eyes  . aspirin EC 81 MG tablet Take 1 tablet (81 mg total) by mouth daily.  Marland Kitchen diltiazem (CARDIZEM CD) 120 MG 24 hr capsule Take 1 capsule (120 mg total) by mouth daily.  Marland Kitchen docusate sodium (COLACE) 100 MG capsule Take 100 mg by mouth 2 (two) times daily.  . folic acid (FOLVITE) 1 MG tablet Take 1 mg by mouth daily.  . furosemide (LASIX) 20 MG tablet Take 1 tablet (20 mg total) by mouth daily as needed (lower extremity swelling).  Marland Kitchen  guaiFENesin (MUCINEX) 600 MG 12 hr tablet Take 1 tablet (600 mg total) by mouth 2 (two) times daily.  . Nutritional Supplements (NUTRITIONAL DRINK PO) Take 1 Bottle by mouth 2 (two) times a day.   . prochlorperazine (COMPAZINE) 10 MG tablet Take 1 tablet (10 mg total) by mouth every 6 (six) hours as needed.  . [DISCONTINUED] diltiazem (CARDIZEM CD) 120 MG 24 hr capsule Take 1 capsule (120 mg total) by mouth daily.     Allergies:   Patient has no known allergies.   Social History   Tobacco Use  . Smoking status: Never Smoker  . Smokeless tobacco: Never Used  Substance Use Topics  . Alcohol use: Yes    Alcohol/week: 1.0 standard drinks    Types: 1 Glasses of wine per week  . Drug use: No     Family Hx: The patient's family history includes Cancer in his brother; Heart disease in his father.  ROS:  Please see the history of present illness.    All other systems reviewed and are negative.   Prior CV studies:   The following studies were reviewed today:  TTE 10/09/2018  Echo 10/09/2018 1. The left ventricle has a visually estimated ejection fraction of of 55%. The cavity size was normal. Left ventricular diastolic parameters were normal No evidence of left ventricular regional wall motion abnormalities. 2. The right ventricle has normal systolic function. The cavity was normal. There is no increase in right ventricular wall thickness. 3. Left atrial size was moderately dilated. 4. The tricuspid valve is normal in structure. 5. The aortic valve is tricuspid Mild calcification of the aortic valve. no stenosis of the aortic valve. 6. The pulmonic valve was normal in structure. 7. The aortic root and ascending aorta are normal in size and structure. 8. The mitral valve is normal in structure. No evidence of mitral valve stenosis. Trivial mitral regurgitation. 9. Normal IVC size. PA systolic pressure 37 mmHg.  Labs/Other Tests and Data Reviewed:    EKG:  No ECG reviewed.   Recent Labs: 10/10/2018: B Natriuretic Peptide 360.9 10/11/2018: Magnesium 1.7 04/26/2019: TSH 2.306 05/28/2019: ALT 10; BUN 35; Creatinine 1.76; Hemoglobin 9.4; Platelet Count 203; Potassium 4.4; Sodium 138   Recent Lipid Panel No results found for: CHOL, TRIG, HDL, CHOLHDL, LDLCALC, LDLDIRECT  Wt Readings from Last 3 Encounters:  06/14/19 145 lb (65.8 kg)  05/28/19 146 lb (66.2 kg)  05/07/19 148 lb 12.8 oz (67.5 kg)     Objective:    Vital Signs:  Wt 145 lb (65.8 kg)   BMI 19.13 kg/m    VITAL SIGNS:  reviewed    ASSESSMENT & PLAN:    New onset atrial fibrillation in the setting of anemia during blood transfusion treated with amiodarone and diltiazem. Off amiodarone, remains in SR, regular HR in 70', on Cardizem to 120 mg po daily. CHADS-VASc 1, we will d/c eliquis once he finishes this month and start ASA 81 mg po daily.  We will continue the same management, he had an EKG done at his cancer center on January 31, 2019 and he was in sinus rhythm.  He had one episode of A. fib in August nothing since then, he is advised to call us when he has more frequent episodes of palpitations and elevated heart rate.  Chronic diastolic CHF -this has resolved, he is now rather dry, with elevated creatinine, his chemo has been discontinued.  He is encouraged to increase his fluid intake.  Anemia - stable Hb 9.4 on 05/28/2019  Metastatic non-small cell lung CA followed by oncology, plan as described in HPI, now off chemotherapy.  COVID-19 Education: The signs and symptoms of COVID-19 were discussed with the patient and how to seek care for testing (follow up with PCP or arrange E-visit).  The importance of social distancing was discussed today.  Time:   Today, I have spent 20 minutes with the patient with telehealth technology discussing the above problems.    Medication Adjustments/Labs and Tests Ordered: Current medicines are reviewed at length with the patient today.  Concerns regarding  medicines are outlined above.   Tests Ordered: No orders of the defined types were placed in this encounter.  Medication Changes: Meds ordered this encounter  Medications  . diltiazem (CARDIZEM CD) 120 MG 24 hr capsule    Sig: Take 1 capsule (120 mg total) by mouth daily.    Dispense:  90 capsule    Refill:  3  Disposition:  Follow up in 3 month(s)  Signed, [name], MD  06/14/2019 10:10 AM    Scissors

## 2019-06-14 NOTE — Patient Instructions (Signed)
Medication Instructions:   Your physician recommends that you continue on your current medications as directed. Please refer to the Current Medication list given to you today.  *If you need a refill on your cardiac medications before your next appointment, please call your pharmacy*     Follow-Up: At [facility], you and your health needs are our priority.  As part of our continuing mission to provide you with exceptional heart care, we have created designated Provider Care Teams.  These Care Teams include your primary Cardiologist (physician) and Advanced Practice Providers (APPs -  Physician Assistants and Nurse Practitioners) who all work together to provide you with the care you need, when you need it.  Your next appointment:   October 17, 2019 AT 9:40 AM  The format for your next appointment:   In Person  Provider:   [name], MD  Other Instructions  [place] BP CUFF MAILED TO [place].  ALSO WHEN YOU GO TO SEE YOUR ONCOLOGIST DR. SHERRILL, PLEASE ASK IF HE CAN DO AN EKG ON YOU, FOR DR. Meda Coffee TO HAVE FOR HER REVIEW. IF HE NEEDS TO FAX THE EKG, HE CAN FAX THIS TO [place] AT [phone] ATTENTION: DR. Meda Coffee

## 2019-06-16 ENCOUNTER — Other Ambulatory Visit: Payer: Self-pay | Admitting: Oncology

## 2019-06-17 ENCOUNTER — Telehealth: Payer: Self-pay | Admitting: Licensed Clinical Social Worker

## 2019-06-17 NOTE — Telephone Encounter (Signed)
CSW referred to assist patient with obtaining a BP cuff. CSW contacted patient to inform cuff will be delivered to home. Message left. CSW available as needed. Jackie Anuradha Chabot, LCSW, CCSW-MCS [phone]  

## 2019-06-18 ENCOUNTER — Inpatient Hospital Stay: Payer: Medicare Other

## 2019-06-18 ENCOUNTER — Inpatient Hospital Stay: Payer: Medicare Other | Attending: Oncology | Admitting: Oncology

## 2019-06-18 ENCOUNTER — Other Ambulatory Visit: Payer: Self-pay

## 2019-06-18 VITALS — BP 142/88 | HR 93 | Temp 98.2°F | Resp 17 | Ht 73.0 in | Wt 145.8 lb

## 2019-06-18 DIAGNOSIS — I499 Cardiac arrhythmia, unspecified: Secondary | ICD-10-CM | POA: Diagnosis not present

## 2019-06-18 DIAGNOSIS — C3490 Malignant neoplasm of unspecified part of unspecified bronchus or lung: Secondary | ICD-10-CM | POA: Insufficient documentation

## 2019-06-18 DIAGNOSIS — Z5112 Encounter for antineoplastic immunotherapy: Secondary | ICD-10-CM | POA: Diagnosis not present

## 2019-06-18 DIAGNOSIS — R5383 Other fatigue: Secondary | ICD-10-CM

## 2019-06-18 DIAGNOSIS — Z79899 Other long term (current) drug therapy: Secondary | ICD-10-CM | POA: Diagnosis not present

## 2019-06-18 DIAGNOSIS — C782 Secondary malignant neoplasm of pleura: Secondary | ICD-10-CM | POA: Diagnosis not present

## 2019-06-18 LAB — CBC WITH DIFFERENTIAL (CANCER CENTER ONLY)
Abs Immature Granulocytes: 0.03 10*3/uL (ref 0.00–0.07)
Basophils Absolute: 0 10*3/uL (ref 0.0–0.1)
Basophils Relative: 0 %
Eosinophils Absolute: 0.2 10*3/uL (ref 0.0–0.5)
Eosinophils Relative: 3 %
HCT: 31.9 % — ABNORMAL LOW (ref 39.0–52.0)
Hemoglobin: 10.3 g/dL — ABNORMAL LOW (ref 13.0–17.0)
Immature Granulocytes: 1 %
Lymphocytes Relative: 16 %
Lymphs Abs: 1 10*3/uL (ref 0.7–4.0)
MCH: 31.8 pg (ref 26.0–34.0)
MCHC: 32.3 g/dL (ref 30.0–36.0)
MCV: 98.5 fL (ref 80.0–100.0)
Monocytes Absolute: 0.8 10*3/uL (ref 0.1–1.0)
Monocytes Relative: 14 %
Neutro Abs: 4 10*3/uL (ref 1.7–7.7)
Neutrophils Relative %: 66 %
Platelet Count: 255 10*3/uL (ref 150–400)
RBC: 3.24 MIL/uL — ABNORMAL LOW (ref 4.22–5.81)
RDW: 12.4 % (ref 11.5–15.5)
WBC Count: 6 10*3/uL (ref 4.0–10.5)
nRBC: 0 % (ref 0.0–0.2)

## 2019-06-18 LAB — CMP (CANCER CENTER ONLY)
ALT: 12 U/L (ref 0–44)
AST: 22 U/L (ref 15–41)
Albumin: 3.1 g/dL — ABNORMAL LOW (ref 3.5–5.0)
Alkaline Phosphatase: 108 U/L (ref 38–126)
Anion gap: 11 (ref 5–15)
BUN: 36 mg/dL — ABNORMAL HIGH (ref 8–23)
CO2: 27 mmol/L (ref 22–32)
Calcium: 9.8 mg/dL (ref 8.9–10.3)
Chloride: 99 mmol/L (ref 98–111)
Creatinine: 1.82 mg/dL — ABNORMAL HIGH (ref 0.61–1.24)
GFR, Est AFR Am: 42 mL/min — ABNORMAL LOW (ref >60)
GFR, Estimated: 36 mL/min — ABNORMAL LOW (ref >60)
Glucose, Bld: 99 mg/dL (ref 70–99)
Potassium: 4.4 mmol/L (ref 3.5–5.1)
Sodium: 137 mmol/L (ref 135–145)
Total Bilirubin: 0.3 mg/dL (ref 0.3–1.2)
Total Protein: 8.5 g/dL — ABNORMAL HIGH (ref 6.5–8.1)

## 2019-06-18 LAB — TSH: TSH: 2.885 u[IU]/mL (ref 0.320–4.118)

## 2019-06-18 MED ORDER — SODIUM CHLORIDE 0.9 % IV SOLN
Freq: Once | INTRAVENOUS | Status: AC
  Administered 2019-06-18: 13:00:00 via INTRAVENOUS
  Filled 2019-06-18: qty 250

## 2019-06-18 MED ORDER — SODIUM CHLORIDE 0.9 % IV SOLN
200.0000 mg | Freq: Once | INTRAVENOUS | Status: AC
  Administered 2019-06-18: 14:00:00 200 mg via INTRAVENOUS
  Filled 2019-06-18: qty 8

## 2019-06-18 NOTE — Progress Notes (Signed)
Reports he has appointment with ophthalmology in 2 weeks. His prior MD had retired and this was his first available.

## 2019-06-18 NOTE — Progress Notes (Signed)
[facility] OFFICE PROGRESS NOTE   Diagnosis: Non-small cell lung cancer  INTERVAL HISTORY:   Mr. Decuir completed another treatment with pembrolizumab on 05/28/2019.  He feels well.  Good appetite.  No dyspnea.  He reports increased mucus production in the mornings.  This does not occur later in the day.  He has noticed a rash dorsum of the left hand today.  Objective:  Vital signs in last 24 hours:  Blood pressure (!) 142/88, pulse 93, temperature 98.2 F (36.8 C), temperature source Temporal, resp. rate 17, height _0  (1.854 m), weight 145 lb 12.8 oz (66.1 kg), SpO2 99 %.    Resp: Decreased breath sounds throughout the right compared to the left chest, no respiratory distress Cardio: Regular rate and rhythm with premature beat Vascular: No leg edema  Skin: Few areas of erythema at the dorsum of the left hand, no other rash    Lab Results:  Lab Results  Component Value Date   WBC 6.0 06/18/2019   HGB 10.3 (L) 06/18/2019   HCT 31.9 (L) 06/18/2019   MCV 98.5 06/18/2019   PLT 255 06/18/2019   NEUTROABS 4.0 06/18/2019    CMP  Lab Results  Component Value Date   NA 137 06/18/2019   K 4.4 06/18/2019   CL 99 06/18/2019   CO2 27 06/18/2019   GLUCOSE 99 06/18/2019   BUN 36 (H) 06/18/2019   CREATININE 1.82 (H) 06/18/2019   CALCIUM 9.8 06/18/2019   PROT 8.5 (H) 06/18/2019   ALBUMIN 3.1 (L) 06/18/2019   AST 22 06/18/2019   ALT 12 06/18/2019   ALKPHOS 108 06/18/2019   BILITOT 0.3 06/18/2019   GFRNONAA 36 (L) 06/18/2019   GFRAA 42 (L) 06/18/2019     Medications: I have reviewed the patient's current medications.   Assessment/Plan: 1. Metastatic non-small cell lung cancer  largeright pleural effusion, right lung mass, right pleural-based masses, left lung nodules  Right thoracentesis 09/04/2018-negative cytology  CT biopsy of right lateral pleural mass 09/07/2018-poorly differentiated non-small cell carcinoma, malignant cells positive for  cytokeratin AE1/AE3 and cytokeratin 8/18, histology favoring adenocarcinoma, MSS, tumor mutation burden 5.no EGFR, ALK, BRAF alteration. PDL 1 rearrangement  PDL 1 tumor proportion score-100%   Cycle 1 Alimta/carboplatin 09/22/2018, pembrolizumab 09/28/2018  Cycle 2 Alimta/carboplatin/pembrolizumab 10/15/2018  Cycle 3 Alimta/carboplatin/pembrolizumab 11/06/2018  Cycle4Alimta/carboplatin/pembrolizumab 11/28/2018  CT chest 12/14/2018-response to therapy of lung primary/primaries, thoracic nodal and pulmonary metastasis. Decrease in right pleural effusion with improvement in pleural-based metastasis. New T10 heterogeneous sclerotic density likely due to healing of previously CT occult metastasis.  Pembrolizumab 12/19/2018  Pembrolizumab/Alimta starting 01/09/2019  Pembrolizumab/Alimta 01/31/2019  Pembrolizumab/Alimta 02/21/2019  Pembrolizumab/Alimta 03/14/2019  CT 04/01/2019-no evidence of disease progression, small loculated right pleural effusion and right basilar pleural nodularity-stable, stable left lower lobe nodule, persistent sclerosis at T10  Pembrolizumab/Alimta 04/05/2019  Pembrolizumab 05/07/2019  Pembrolizumab 05/28/2019  Pembrolizumab 06/18/2019   2.Severe anemia-likely secondary to bleeding in the right pleural space and metastatic carcinoma, red cell transfusions 09/09/2018 09/10/2018, 09/23/2018-improved;progressive 04/26/2019, red cell transfusion. 3.Dyspnea secondary to the large right pleural effusion and anemia  Right Pleurx catheter placed 09/11/2018  Chest x-ray 10/09/2018-large right pleural effusion, loculated  Chest x-ray 11/06/2018-no interval change in the right-sided pleural effusion  Pleurx drainage catheter removed 11/13/2018 4.History of tobacco use 5.History of kidney stones 6. Fever-most likely tumor fever, improved 7. Admission 10/08/2018 with rapid atrial fibrillation/flutter-improved with diltiazem, started on Eliquis anticoagulation.  Eliquis discontinued. Now on aspirin. 8.  Renal insufficiency  Disposition: Mr. Jurgens appears unchanged.  He is tolerating the pembrolizumab well.  He will complete another treatment today.  There is no clinical evidence of progressive lung cancer.  We will plan for a restaging CT after the next cycle.  He requested we obtain an EKG to follow-up on his history of an arrhythmia.  Betsy Coder, MD  06/18/2019  12:35 PM

## 2019-06-18 NOTE — Patient Instructions (Signed)
[facility] Discharge Instructions for Patients Receiving Chemotherapy  Today you received the following Immunotherapy: Pembrolizumab  To help prevent nausea and vomiting after your treatment, we encourage you to take your nausea medication as directed by your MD.   If you develop nausea and vomiting that is not controlled by your nausea medication, call the clinic.   BELOW ARE SYMPTOMS THAT SHOULD BE REPORTED IMMEDIATELY:  *FEVER GREATER THAN 100.5 F  *CHILLS WITH OR WITHOUT FEVER  NAUSEA AND VOMITING THAT IS NOT CONTROLLED WITH YOUR NAUSEA MEDICATION  *UNUSUAL SHORTNESS OF BREATH  *UNUSUAL BRUISING OR BLEEDING  TENDERNESS IN MOUTH AND THROAT WITH OR WITHOUT PRESENCE OF ULCERS  *URINARY PROBLEMS  *BOWEL PROBLEMS  UNUSUAL RASH Items with * indicate a potential emergency and should be followed up as soon as possible.  Feel free to call the clinic should you have any questions or concerns. The clinic phone number is [phone].  Please show the [facility] at check-in to the Emergency Department and triage nurse. Coronavirus (COVID-19) Are you at risk?  Are you at risk for the Coronavirus (COVID-19)?  To be considered HIGH RISK for Coronavirus (COVID-19), you have to meet the following criteria:  . Traveled to [place], [place], [place], [place] or [place]; or in the [place] to [place], [place], [place], or [place]; and have fever, cough, and shortness of breath within the last 2 weeks of travel OR . Been in close contact with a person diagnosed with COVID-19 within the last 2 weeks and have fever, cough, and shortness of breath . IF YOU DO NOT MEET THESE CRITERIA, YOU ARE CONSIDERED LOW RISK FOR COVID-19.  What to do if you are HIGH RISK for COVID-19?  Marland Kitchen If you are having a medical emergency, call 911. . Seek medical care right away. Before you go to a doctor's office, urgent care or emergency department, call ahead and tell them about  your recent travel, contact with someone diagnosed with COVID-19, and your symptoms. You should receive instructions from your physician's office regarding next steps of care.  . When you arrive at healthcare provider, tell the healthcare staff immediately you have returned from visiting [place], [place], [place], [place] or [place]; or traveled in the [place] to [place], [place], [place], or [place]; in the last two weeks or you have been in close contact with a person diagnosed with COVID-19 in the last 2 weeks.   . Tell the health care staff about your symptoms: fever, cough and shortness of breath. . After you have been seen by a medical provider, you will be either: o Tested for (COVID-19) and discharged home on quarantine except to seek medical care if symptoms worsen, and asked to  - Stay home and avoid contact with others until you get your results (4-5 days)  - Avoid travel on public transportation if possible (such as bus, train, or airplane) or o Sent to the Emergency Department by EMS for evaluation, COVID-19 testing, and possible admission depending on your condition and test results.  What to do if you are LOW RISK for COVID-19?  Reduce your risk of any infection by using the same precautions used for avoiding the common cold or flu:  Marland Kitchen Wash your hands often with soap and warm water for at least 20 seconds.  If soap and water are not readily available, use an alcohol-based hand sanitizer with at least 60% alcohol.  . If coughing or  sneezing, cover your mouth and nose by coughing or sneezing into the elbow areas of your shirt or coat, into a tissue or into your sleeve (not your hands). . Avoid shaking hands with others and consider head nods or verbal greetings only. . Avoid touching your eyes, nose, or mouth with unwashed hands.  . Avoid close contact with people who are sick. . Avoid places or events with large numbers of people in one location, like concerts or sporting  events. . Carefully consider travel plans you have or are making. . If you are planning any travel outside or inside the [place], visit the CDC's Travelers' Health webpage for the latest health notices. . If you have some symptoms but not all symptoms, continue to monitor at home and seek medical attention if your symptoms worsen. . If you are having a medical emergency, call 911.   [place] / e-Visit: [email or web address]         [facility]: [facility]: [phone]                   [facility]: [phone]

## 2019-06-18 NOTE — Progress Notes (Signed)
Per Dr. [name] ok for treatment today with Creatine 1.82.

## 2019-06-19 ENCOUNTER — Telehealth: Payer: Self-pay | Admitting: Oncology

## 2019-06-19 ENCOUNTER — Telehealth: Payer: Medicare Other | Admitting: Cardiology

## 2019-06-19 NOTE — Telephone Encounter (Signed)
Scheduled per los. Called and left msg. Mailed printout  °

## 2019-07-04 DIAGNOSIS — H2513 Age-related nuclear cataract, bilateral: Secondary | ICD-10-CM | POA: Diagnosis not present

## 2019-07-04 DIAGNOSIS — H10413 Chronic giant papillary conjunctivitis, bilateral: Secondary | ICD-10-CM | POA: Diagnosis not present

## 2019-07-07 ENCOUNTER — Other Ambulatory Visit: Payer: Self-pay | Admitting: Oncology

## 2019-07-09 ENCOUNTER — Encounter: Payer: Self-pay | Admitting: Nurse Practitioner

## 2019-07-09 ENCOUNTER — Other Ambulatory Visit: Payer: Self-pay

## 2019-07-09 ENCOUNTER — Inpatient Hospital Stay: Payer: Medicare Other

## 2019-07-09 ENCOUNTER — Inpatient Hospital Stay (HOSPITAL_BASED_OUTPATIENT_CLINIC_OR_DEPARTMENT_OTHER): Payer: Medicare Other | Admitting: Nurse Practitioner

## 2019-07-09 VITALS — BP 145/87 | HR 93 | Temp 98.3°F | Resp 18 | Ht 73.0 in | Wt 145.7 lb

## 2019-07-09 DIAGNOSIS — R5383 Other fatigue: Secondary | ICD-10-CM | POA: Diagnosis not present

## 2019-07-09 DIAGNOSIS — C3490 Malignant neoplasm of unspecified part of unspecified bronchus or lung: Secondary | ICD-10-CM | POA: Diagnosis not present

## 2019-07-09 DIAGNOSIS — Z79899 Other long term (current) drug therapy: Secondary | ICD-10-CM | POA: Diagnosis not present

## 2019-07-09 DIAGNOSIS — C3491 Malignant neoplasm of unspecified part of right bronchus or lung: Secondary | ICD-10-CM | POA: Diagnosis not present

## 2019-07-09 DIAGNOSIS — Z5112 Encounter for antineoplastic immunotherapy: Secondary | ICD-10-CM | POA: Diagnosis not present

## 2019-07-09 DIAGNOSIS — I499 Cardiac arrhythmia, unspecified: Secondary | ICD-10-CM | POA: Diagnosis not present

## 2019-07-09 DIAGNOSIS — C782 Secondary malignant neoplasm of pleura: Secondary | ICD-10-CM | POA: Diagnosis not present

## 2019-07-09 LAB — CMP (CANCER CENTER ONLY)
ALT: 15 U/L (ref 0–44)
AST: 23 U/L (ref 15–41)
Albumin: 3.3 g/dL — ABNORMAL LOW (ref 3.5–5.0)
Alkaline Phosphatase: 105 U/L (ref 38–126)
Anion gap: 12 (ref 5–15)
BUN: 34 mg/dL — ABNORMAL HIGH (ref 8–23)
CO2: 27 mmol/L (ref 22–32)
Calcium: 9.6 mg/dL (ref 8.9–10.3)
Chloride: 98 mmol/L (ref 98–111)
Creatinine: 1.6 mg/dL — ABNORMAL HIGH (ref 0.61–1.24)
GFR, Est AFR Am: 49 mL/min — ABNORMAL LOW (ref >60)
GFR, Estimated: 42 mL/min — ABNORMAL LOW (ref >60)
Glucose, Bld: 100 mg/dL — ABNORMAL HIGH (ref 70–99)
Potassium: 4.1 mmol/L (ref 3.5–5.1)
Sodium: 137 mmol/L (ref 135–145)
Total Bilirubin: 0.4 mg/dL (ref 0.3–1.2)
Total Protein: 8.4 g/dL — ABNORMAL HIGH (ref 6.5–8.1)

## 2019-07-09 LAB — CBC WITH DIFFERENTIAL (CANCER CENTER ONLY)
Abs Immature Granulocytes: 0.02 10*3/uL (ref 0.00–0.07)
Basophils Absolute: 0 10*3/uL (ref 0.0–0.1)
Basophils Relative: 0 %
Eosinophils Absolute: 0.1 10*3/uL (ref 0.0–0.5)
Eosinophils Relative: 2 %
HCT: 33.5 % — ABNORMAL LOW (ref 39.0–52.0)
Hemoglobin: 10.7 g/dL — ABNORMAL LOW (ref 13.0–17.0)
Immature Granulocytes: 0 %
Lymphocytes Relative: 16 %
Lymphs Abs: 1 10*3/uL (ref 0.7–4.0)
MCH: 30.7 pg (ref 26.0–34.0)
MCHC: 31.9 g/dL (ref 30.0–36.0)
MCV: 96.3 fL (ref 80.0–100.0)
Monocytes Absolute: 0.8 10*3/uL (ref 0.1–1.0)
Monocytes Relative: 13 %
Neutro Abs: 4.5 10*3/uL (ref 1.7–7.7)
Neutrophils Relative %: 69 %
Platelet Count: 253 10*3/uL (ref 150–400)
RBC: 3.48 MIL/uL — ABNORMAL LOW (ref 4.22–5.81)
RDW: 12.1 % (ref 11.5–15.5)
WBC Count: 6.5 10*3/uL (ref 4.0–10.5)
nRBC: 0 % (ref 0.0–0.2)

## 2019-07-09 MED ORDER — SODIUM CHLORIDE 0.9 % IV SOLN
200.0000 mg | Freq: Once | INTRAVENOUS | Status: AC
  Administered 2019-07-09: 200 mg via INTRAVENOUS
  Filled 2019-07-09: qty 8

## 2019-07-09 MED ORDER — SODIUM CHLORIDE 0.9 % IV SOLN
Freq: Once | INTRAVENOUS | Status: AC
  Administered 2019-07-09: 15:00:00 via INTRAVENOUS
  Filled 2019-07-09: qty 250

## 2019-07-09 NOTE — Progress Notes (Signed)
Per Lattie Haw T NP ok to tx with creatinine of 1.6 today.

## 2019-07-09 NOTE — Patient Instructions (Signed)
[facility] Discharge Instructions for Patients Receiving Chemotherapy  Today you received the following Immunotherapy: Pembrolizumab  To help prevent nausea and vomiting after your treatment, we encourage you to take your nausea medication as directed by your MD.   If you develop nausea and vomiting that is not controlled by your nausea medication, call the clinic.   BELOW ARE SYMPTOMS THAT SHOULD BE REPORTED IMMEDIATELY:  *FEVER GREATER THAN 100.5 F  *CHILLS WITH OR WITHOUT FEVER  NAUSEA AND VOMITING THAT IS NOT CONTROLLED WITH YOUR NAUSEA MEDICATION  *UNUSUAL SHORTNESS OF BREATH  *UNUSUAL BRUISING OR BLEEDING  TENDERNESS IN MOUTH AND THROAT WITH OR WITHOUT PRESENCE OF ULCERS  *URINARY PROBLEMS  *BOWEL PROBLEMS  UNUSUAL RASH Items with * indicate a potential emergency and should be followed up as soon as possible.  Feel free to call the clinic should you have any questions or concerns. The clinic phone number is [phone].  Please show the [facility] at check-in to the Emergency Department and triage nurse. Coronavirus (COVID-19) Are you at risk?  Are you at risk for the Coronavirus (COVID-19)?  To be considered HIGH RISK for Coronavirus (COVID-19), you have to meet the following criteria:  . Traveled to [place], [place], [place], [place] or [place]; or in the [place] to [place], [place], [place], or [place]; and have fever, cough, and shortness of breath within the last 2 weeks of travel OR . Been in close contact with a person diagnosed with COVID-19 within the last 2 weeks and have fever, cough, and shortness of breath . IF YOU DO NOT MEET THESE CRITERIA, YOU ARE CONSIDERED LOW RISK FOR COVID-19.  What to do if you are HIGH RISK for COVID-19?  Marland Kitchen If you are having a medical emergency, call 911. . Seek medical care right away. Before you go to a doctor's office, urgent care or emergency department, call ahead and tell them about  your recent travel, contact with someone diagnosed with COVID-19, and your symptoms. You should receive instructions from your physician's office regarding next steps of care.  . When you arrive at healthcare provider, tell the healthcare staff immediately you have returned from visiting [place], [place], [place], [place] or [place]; or traveled in the [place] to [place], [place], [place], or [place]; in the last two weeks or you have been in close contact with a person diagnosed with COVID-19 in the last 2 weeks.   . Tell the health care staff about your symptoms: fever, cough and shortness of breath. . After you have been seen by a medical provider, you will be either: o Tested for (COVID-19) and discharged home on quarantine except to seek medical care if symptoms worsen, and asked to  - Stay home and avoid contact with others until you get your results (4-5 days)  - Avoid travel on public transportation if possible (such as bus, train, or airplane) or o Sent to the Emergency Department by EMS for evaluation, COVID-19 testing, and possible admission depending on your condition and test results.  What to do if you are LOW RISK for COVID-19?  Reduce your risk of any infection by using the same precautions used for avoiding the common cold or flu:  Marland Kitchen Wash your hands often with soap and warm water for at least 20 seconds.  If soap and water are not readily available, use an alcohol-based hand sanitizer with at least 60% alcohol.  . If coughing or  sneezing, cover your mouth and nose by coughing or sneezing into the elbow areas of your shirt or coat, into a tissue or into your sleeve (not your hands). . Avoid shaking hands with others and consider head nods or verbal greetings only. . Avoid touching your eyes, nose, or mouth with unwashed hands.  . Avoid close contact with people who are sick. . Avoid places or events with large numbers of people in one location, like concerts or sporting  events. . Carefully consider travel plans you have or are making. . If you are planning any travel outside or inside the [place], visit the CDC's Travelers' Health webpage for the latest health notices. . If you have some symptoms but not all symptoms, continue to monitor at home and seek medical attention if your symptoms worsen. . If you are having a medical emergency, call 911.   [place] / e-Visit: [email or web address]         [facility]: [facility]: [phone]                   [facility]: [phone]

## 2019-07-09 NOTE — Progress Notes (Signed)
[facility] OFFICE PROGRESS NOTE   Diagnosis: Non-small cell lung cancer  INTERVAL HISTORY:   [name] returns as scheduled.  He completed a cycle of pembrolizumab 06/18/2019.  He denies nausea/vomiting.  No mouth sores.  No diarrhea.  He continues to note a rash at the dorsum of the left hand, smaller area dorsum of the right hand.  There is associated pruritus.  No rash elsewhere.  He denies shortness of breath.  No fever.  Occasional cough.  Objective:  Vital signs in last 24 hours:  Blood pressure (!) 145/87, pulse 93, temperature 98.3 F (36.8 C), temperature source Temporal, resp. rate 18, height '6\' 1"'  (1.854 m), weight 145 lb 11.2 oz (66.1 kg), SpO2 98 %.    HEENT: No thrush or ulcers. GI: Abdomen soft and nontender.  No hepatomegaly. Vascular: No leg edema. Neuro: Alert and oriented. Skin: Area of erythema dorsum of the left hand, smaller area dorsum of the right hand.  No other rash.   Lab Results:  Lab Results  Component Value Date   WBC 6.5 07/09/2019   HGB 10.7 (L) 07/09/2019   HCT 33.5 (L) 07/09/2019   MCV 96.3 07/09/2019   PLT 253 07/09/2019   NEUTROABS 4.5 07/09/2019    Imaging:  No results found.  Medications: I have reviewed the patient's current medications.  Assessment/Plan: 1. Metastatic non-small cell lung cancer  largeright pleural effusion, right lung mass, right pleural-based masses, left lung nodules  Right thoracentesis 09/04/2018-negative cytology  CT biopsy of right lateral pleural mass 09/07/2018-poorly differentiated non-small cell carcinoma, malignant cells positive for cytokeratin AE1/AE3 and cytokeratin 8/18, histology favoring adenocarcinoma, MSS, tumor mutation burden 5.no EGFR, ALK, BRAF alteration. PDL 1 rearrangement  PDL 1 tumor proportion score-100%   Cycle 1 Alimta/carboplatin 09/22/2018, pembrolizumab 09/28/2018  Cycle 2 Alimta/carboplatin/pembrolizumab 10/15/2018  Cycle 3  Alimta/carboplatin/pembrolizumab 11/06/2018  Cycle4Alimta/carboplatin/pembrolizumab 11/28/2018  CT chest 12/14/2018-response to therapy of lung primary/primaries, thoracic nodal and pulmonary metastasis. Decrease in right pleural effusion with improvement in pleural-based metastasis. New T10 heterogeneous sclerotic density likely due to healing of previously CT occult metastasis.  Pembrolizumab 12/19/2018  Pembrolizumab/Alimta starting 01/09/2019  Pembrolizumab/Alimta 01/31/2019  Pembrolizumab/Alimta 02/21/2019  Pembrolizumab/Alimta 03/14/2019  CT 04/01/2019-no evidence of disease progression, small loculated right pleural effusion and right basilar pleural nodularity-stable, stable left lower lobe nodule, persistent sclerosis at T10  Pembrolizumab/Alimta 04/05/2019  Pembrolizumab 05/07/2019  Pembrolizumab 05/28/2019  Pembrolizumab 06/18/2019  Pembrolizumab 07/09/2019   2.Severe anemia-likely secondary to bleeding in the right pleural space and metastatic carcinoma, red cell transfusions 09/09/2018 09/10/2018, 09/23/2018-improved;progressive 04/26/2019, red cell transfusion. 3.Dyspnea secondary to the large right pleural effusion and anemia  Right Pleurx catheter placed 09/11/2018  Chest x-ray 10/09/2018-large right pleural effusion, loculated  Chest x-ray 11/06/2018-no interval change in the right-sided pleural effusion  Pleurx drainage catheter removed 11/13/2018 4.History of tobacco use 5.History of kidney stones 6. Fever-most likely tumor fever, improved 7. Admission 10/08/2018 with rapid atrial fibrillation/flutter-improved with diltiazem, started on Eliquis anticoagulation. Eliquis discontinued. Now on aspirin. 8.Renal insufficiency    Disposition: [name] appears stable.  He is on active treatment with every 3-week pembrolizumab.  There is no clinical evidence of disease progression.  Plan to continue the same.  He will undergo a restaging chest CT prior  to his next visit.  We reviewed the CBC and chemistry panel from today.  Creatinine is mildly improved.  Labs are adequate for treatment.  He will try topical hydrocortisone for the small areas of rash at the dorsum  of both hands.  He will return for lab, follow-up, pembrolizumab in 3 weeks.  [name] ANP/GNP-BC   07/09/2019  2:24 PM

## 2019-07-18 DIAGNOSIS — H5213 Myopia, bilateral: Secondary | ICD-10-CM | POA: Diagnosis not present

## 2019-07-18 DIAGNOSIS — H25813 Combined forms of age-related cataract, bilateral: Secondary | ICD-10-CM | POA: Diagnosis not present

## 2019-07-18 DIAGNOSIS — H524 Presbyopia: Secondary | ICD-10-CM | POA: Diagnosis not present

## 2019-07-26 ENCOUNTER — Other Ambulatory Visit: Payer: Self-pay

## 2019-07-26 ENCOUNTER — Ambulatory Visit
Admission: RE | Admit: 2019-07-26 | Discharge: 2019-07-26 | Disposition: A | Discharge status: Home / Self Care | Payer: Medicare Other | Source: Ambulatory Visit | Attending: Nurse Practitioner | Admitting: Nurse Practitioner

## 2019-07-26 ENCOUNTER — Telehealth: Payer: Self-pay | Admitting: *Deleted

## 2019-07-26 DIAGNOSIS — C3491 Malignant neoplasm of unspecified part of right bronchus or lung: Secondary | ICD-10-CM | POA: Diagnosis not present

## 2019-07-26 NOTE — Telephone Encounter (Signed)
-----   Message from Ladell Pier, MD sent at 07/26/2019  3:04 PM EST ----- Please call patient, CT shows a stable right pleural effusion, no evidence of progressive cancer, follow-up as scheduled

## 2019-07-26 NOTE — Telephone Encounter (Signed)
Notified that right pleural effusion is stable and no new signs of cancer. F/U as scheduled.

## 2019-07-28 ENCOUNTER — Other Ambulatory Visit: Payer: Self-pay | Admitting: Oncology

## 2019-07-30 ENCOUNTER — Inpatient Hospital Stay: Payer: Medicare Other | Attending: Oncology | Admitting: Nurse Practitioner

## 2019-07-30 ENCOUNTER — Inpatient Hospital Stay: Payer: Medicare Other

## 2019-07-30 ENCOUNTER — Encounter: Payer: Self-pay | Admitting: Nurse Practitioner

## 2019-07-30 ENCOUNTER — Other Ambulatory Visit: Payer: Self-pay

## 2019-07-30 VITALS — BP 145/98 | HR 88 | Temp 98.7°F | Resp 18 | Ht 73.0 in | Wt 143.2 lb

## 2019-07-30 DIAGNOSIS — R5383 Other fatigue: Secondary | ICD-10-CM

## 2019-07-30 DIAGNOSIS — Z5112 Encounter for antineoplastic immunotherapy: Secondary | ICD-10-CM | POA: Insufficient documentation

## 2019-07-30 DIAGNOSIS — C3491 Malignant neoplasm of unspecified part of right bronchus or lung: Secondary | ICD-10-CM

## 2019-07-30 DIAGNOSIS — C771 Secondary and unspecified malignant neoplasm of intrathoracic lymph nodes: Secondary | ICD-10-CM | POA: Diagnosis not present

## 2019-07-30 DIAGNOSIS — Z79899 Other long term (current) drug therapy: Secondary | ICD-10-CM | POA: Diagnosis not present

## 2019-07-30 DIAGNOSIS — C3490 Malignant neoplasm of unspecified part of unspecified bronchus or lung: Secondary | ICD-10-CM | POA: Insufficient documentation

## 2019-07-30 LAB — CMP (CANCER CENTER ONLY)
ALT: 13 U/L (ref 0–44)
AST: 21 U/L (ref 15–41)
Albumin: 3.4 g/dL — ABNORMAL LOW (ref 3.5–5.0)
Alkaline Phosphatase: 114 U/L (ref 38–126)
Anion gap: 10 (ref 5–15)
BUN: 26 mg/dL — ABNORMAL HIGH (ref 8–23)
CO2: 27 mmol/L (ref 22–32)
Calcium: 9.4 mg/dL (ref 8.9–10.3)
Chloride: 101 mmol/L (ref 98–111)
Creatinine: 1.68 mg/dL — ABNORMAL HIGH (ref 0.61–1.24)
GFR, Est AFR Am: 46 mL/min — ABNORMAL LOW (ref >60)
GFR, Estimated: 40 mL/min — ABNORMAL LOW (ref >60)
Glucose, Bld: 110 mg/dL — ABNORMAL HIGH (ref 70–99)
Potassium: 4.5 mmol/L (ref 3.5–5.1)
Sodium: 138 mmol/L (ref 135–145)
Total Bilirubin: 0.4 mg/dL (ref 0.3–1.2)
Total Protein: 8.3 g/dL — ABNORMAL HIGH (ref 6.5–8.1)

## 2019-07-30 LAB — CBC WITH DIFFERENTIAL (CANCER CENTER ONLY)
Abs Immature Granulocytes: 0.01 10*3/uL (ref 0.00–0.07)
Basophils Absolute: 0 10*3/uL (ref 0.0–0.1)
Basophils Relative: 1 %
Eosinophils Absolute: 0.2 10*3/uL (ref 0.0–0.5)
Eosinophils Relative: 3 %
HCT: 35.6 % — ABNORMAL LOW (ref 39.0–52.0)
Hemoglobin: 11.3 g/dL — ABNORMAL LOW (ref 13.0–17.0)
Immature Granulocytes: 0 %
Lymphocytes Relative: 14 %
Lymphs Abs: 0.9 10*3/uL (ref 0.7–4.0)
MCH: 30.5 pg (ref 26.0–34.0)
MCHC: 31.7 g/dL (ref 30.0–36.0)
MCV: 96 fL (ref 80.0–100.0)
Monocytes Absolute: 0.9 10*3/uL (ref 0.1–1.0)
Monocytes Relative: 13 %
Neutro Abs: 4.6 10*3/uL (ref 1.7–7.7)
Neutrophils Relative %: 69 %
Platelet Count: 240 10*3/uL (ref 150–400)
RBC: 3.71 MIL/uL — ABNORMAL LOW (ref 4.22–5.81)
RDW: 11.9 % (ref 11.5–15.5)
WBC Count: 6.6 10*3/uL (ref 4.0–10.5)
nRBC: 0 % (ref 0.0–0.2)

## 2019-07-30 LAB — TSH: TSH: 2.241 u[IU]/mL (ref 0.320–4.118)

## 2019-07-30 MED ORDER — ONDANSETRON HCL 4 MG/2ML IJ SOLN
INTRAMUSCULAR | Status: AC
  Filled 2019-07-30: qty 4

## 2019-07-30 MED ORDER — SODIUM CHLORIDE 0.9 % IV SOLN
200.0000 mg | Freq: Once | INTRAVENOUS | Status: AC
  Administered 2019-07-30: 200 mg via INTRAVENOUS
  Filled 2019-07-30: qty 8

## 2019-07-30 MED ORDER — SODIUM CHLORIDE 0.9 % IV SOLN
Freq: Once | INTRAVENOUS | Status: AC
  Filled 2019-07-30: qty 250

## 2019-07-30 NOTE — Patient Instructions (Signed)
[place] Discharge Instructions for Patients Receiving Chemotherapy  Today you received the following Immunotherapy: Pembrolizumab  To help prevent nausea and vomiting after your treatment, we encourage you to take your nausea medication as directed by your MD.   If you develop nausea and vomiting that is not controlled by your nausea medication, call the clinic.   BELOW ARE SYMPTOMS THAT SHOULD BE REPORTED IMMEDIATELY:  *FEVER GREATER THAN 100.5 F  *CHILLS WITH OR WITHOUT FEVER  NAUSEA AND VOMITING THAT IS NOT CONTROLLED WITH YOUR NAUSEA MEDICATION  *UNUSUAL SHORTNESS OF BREATH  *UNUSUAL BRUISING OR BLEEDING  TENDERNESS IN MOUTH AND THROAT WITH OR WITHOUT PRESENCE OF ULCERS  *URINARY PROBLEMS  *BOWEL PROBLEMS  UNUSUAL RASH Items with * indicate a potential emergency and should be followed up as soon as possible.  Feel free to call the clinic should you have any questions or concerns. The clinic phone number is [phone].  Please show the [facility] at check-in to the Emergency Department and triage nurse. Coronavirus (COVID-19) Are you at risk?  Are you at risk for the Coronavirus (COVID-19)?  To be considered HIGH RISK for Coronavirus (COVID-19), you have to meet the following criteria:  . Traveled to [place], [place], [place], [place] or [place]; or in the [place] to [place], [place], [place], or [place]; and have fever, cough, and shortness of breath within the last 2 weeks of travel OR . Been in close contact with a person diagnosed with COVID-19 within the last 2 weeks and have fever, cough, and shortness of breath . IF YOU DO NOT MEET THESE CRITERIA, YOU ARE CONSIDERED LOW RISK FOR COVID-19.  What to do if you are HIGH RISK for COVID-19?  Marland Kitchen If you are having a medical emergency, call 911. . Seek medical care right away. Before you go to a doctor's office, urgent care or emergency department, call ahead and tell them about  your recent travel, contact with someone diagnosed with COVID-19, and your symptoms. You should receive instructions from your physician's office regarding next steps of care.  . When you arrive at healthcare provider, tell the healthcare staff immediately you have returned from visiting [place], [place], [place], [place] or [place]; or traveled in the [place] to [place], [place], [place], or [place]; in the last two weeks or you have been in close contact with a person diagnosed with COVID-19 in the last 2 weeks.   . Tell the health care staff about your symptoms: fever, cough and shortness of breath. . After you have been seen by a medical provider, you will be either: o Tested for (COVID-19) and discharged home on quarantine except to seek medical care if symptoms worsen, and asked to  - Stay home and avoid contact with others until you get your results (4-5 days)  - Avoid travel on public transportation if possible (such as bus, train, or airplane) or o Sent to the Emergency Department by EMS for evaluation, COVID-19 testing, and possible admission depending on your condition and test results.  What to do if you are LOW RISK for COVID-19?  Reduce your risk of any infection by using the same precautions used for avoiding the common cold or flu:  Marland Kitchen Wash your hands often with soap and warm water for at least 20 seconds.  If soap and water are not readily available, use an alcohol-based hand sanitizer with at least 60% alcohol.  . If coughing or  sneezing, cover your mouth and nose by coughing or sneezing into the elbow areas of your shirt or coat, into a tissue or into your sleeve (not your hands). . Avoid shaking hands with others and consider head nods or verbal greetings only. . Avoid touching your eyes, nose, or mouth with unwashed hands.  . Avoid close contact with people who are sick. . Avoid places or events with large numbers of people in one location, like concerts or sporting  events. . Carefully consider travel plans you have or are making. . If you are planning any travel outside or inside the [place], visit the CDC's Travelers' Health webpage for the latest health notices. . If you have some symptoms but not all symptoms, continue to monitor at home and seek medical attention if your symptoms worsen. . If you are having a medical emergency, call 911.   [place] / e-Visit: [email or web address]         [facility]: [facility]: [phone]                   [facility]: [phone]

## 2019-07-30 NOTE — Progress Notes (Addendum)
[place] OFFICE PROGRESS NOTE   Diagnosis: Non-small cell lung cancer  INTERVAL HISTORY:   Brian Ramirez returns as scheduled.  He completed another cycle of pembrolizumab 07/09/2019.  He denies nausea/vomiting.  No diarrhea.  No rash.  Eye tearing is better.  He denies shortness of breath.  No fever.  Current supplemental oxygen is at 1 L/min.  He has a good appetite.  Objective:  Vital signs in last 24 hours:  Blood pressure (!) 145/98, pulse 88, temperature 98.7 F (37.1 C), temperature source Temporal, resp. rate 18, height '6\' 1"'$  (1.854 m), weight 143 lb 3.2 oz (65 kg), SpO2 98 %.    HEENT: No thrush or ulcers. Resp: Decreased breath sounds right lung field.  No respiratory distress. Cardio: Regular rate and rhythm with occasional premature beat. GI: Abdomen soft and nontender.  No hepatomegaly. Vascular: No leg edema.   Lab Results:  Lab Results  Component Value Date   WBC 6.6 07/30/2019   HGB 11.3 (L) 07/30/2019   HCT 35.6 (L) 07/30/2019   MCV 96.0 07/30/2019   PLT 240 07/30/2019   NEUTROABS 4.6 07/30/2019    Imaging:  No results found.  Medications: I have reviewed the patient's current medications.  Assessment/Plan: 1.Metastatic non-small cell lung cancer  largeright pleural effusion, right lung mass, right pleural-based masses, left lung nodules  Right thoracentesis 09/04/2018-negative cytology  CT biopsy of right lateral pleural mass 09/07/2018-poorly differentiated non-small cell carcinoma, malignant cells positive for cytokeratin AE1/AE3 and cytokeratin 8/18, histology favoring adenocarcinoma, MSS, tumor mutation burden 5.no EGFR, ALK, BRAF alteration. PDL 1 rearrangement  PDL 1 tumor proportion score-100%   Cycle 1 Alimta/carboplatin 09/22/2018, pembrolizumab 09/28/2018  Cycle 2 Alimta/carboplatin/pembrolizumab 10/15/2018  Cycle 3 Alimta/carboplatin/pembrolizumab 11/06/2018  Cycle4Alimta/carboplatin/pembrolizumab  11/28/2018  CT chest 12/14/2018-response to therapy of lung primary/primaries, thoracic nodal and pulmonary metastasis. Decrease in right pleural effusion with improvement in pleural-based metastasis. New T10 heterogeneous sclerotic density likely due to healing of previously CT occult metastasis.  Pembrolizumab 12/19/2018  Pembrolizumab/Alimta starting 01/09/2019  Pembrolizumab/Alimta 01/31/2019  Pembrolizumab/Alimta 02/21/2019  Pembrolizumab/Alimta 03/14/2019  CT 04/01/2019-no evidence of disease progression, small loculated right pleural effusion and right basilar pleural nodularity-stable, stable left lower lobe nodule, persistent sclerosis at T10  Pembrolizumab/Alimta 04/05/2019  Pembrolizumab 05/07/2019  Pembrolizumab 05/28/2019  Pembrolizumab 06/18/2019  Pembrolizumab 07/09/2019  Chest CT 07/26/2019-moderate to large stable exudative right effusion with considerable pleural thickening.  Nodularity at the right lung base along pleural thickening similar to prior.  Stable left paramediastinal groundglass density nodule.  Stable sclerotic lesion posteriorly at the T10 vertebral level.  Pembrolizumab 07/30/2019   2.Severe anemia-likely secondary to bleeding in the right pleural space and metastatic carcinoma, red cell transfusions 09/09/2018 09/10/2018, 09/23/2018-improved;progressive 04/26/2019, red cell transfusion. 3.Dyspnea secondary to the large right pleural effusion and anemia  Right Pleurx catheter placed 09/11/2018  Chest x-ray 10/09/2018-large right pleural effusion, loculated  Chest x-ray 11/06/2018-no interval change in the right-sided pleural effusion  Pleurx drainage catheter removed 11/13/2018 4.History of tobacco use 5.History of kidney stones 6. Fever-most likely tumor fever, improved 7. Admission 10/08/2018 with rapid atrial fibrillation/flutter-improved with diltiazem, started on Eliquis anticoagulation. Eliquis discontinued. Now on aspirin. 8.Renal  insufficiency    Disposition: Brian Ramirez appears stable.  Recent restaging chest CT is stable compared to 04/01/2019, improved compared to January 2020.  Plan is to continue pembrolizumab. He will receive an infusion today.  Going forward he would like to change the schedule to every 6 weeks.  We reviewed the CBC and  chemistry panel from today, adequate to proceed with treatment.  Creatinine with stable elevation.  He will return for lab, follow-up, pembrolizumab in 3 weeks.  He will contact the office in the interim with any problems.  Patient seen with Dr. [name].  CT images reviewed on the computer with Brian Ramirez.  25 minutes were spent face-to-face at today's visit with the majority of that time involved in counseling/coordination of care.  Brian Ramirez ANP/GNP-BC   07/30/2019  2:34 PM This was a shared visit with Ned Card.  [name] appears well.  He is now almost 1 year out from being diagnosed with end-stage non-small cell lung cancer.  The restaging CT reveals no evidence of disease progression.  We reviewed the CT images with him.  He will change to an every 6-week dose/schedule beginning with the next cycle.  Julieanne Manson, MD

## 2019-08-01 ENCOUNTER — Telehealth: Payer: Self-pay | Admitting: Oncology

## 2019-08-01 NOTE — Telephone Encounter (Signed)
Scheduled per los. Called and left msg. mailed printout  

## 2019-08-16 ENCOUNTER — Other Ambulatory Visit: Payer: Self-pay | Admitting: Oncology

## 2019-08-21 ENCOUNTER — Other Ambulatory Visit: Payer: Medicare Other

## 2019-08-21 ENCOUNTER — Inpatient Hospital Stay: Payer: Medicare Other | Attending: Oncology | Admitting: Oncology

## 2019-08-21 ENCOUNTER — Other Ambulatory Visit: Payer: Self-pay

## 2019-08-21 ENCOUNTER — Inpatient Hospital Stay: Payer: Medicare Other

## 2019-08-21 VITALS — BP 156/80 | HR 84 | Temp 98.5°F | Resp 17 | Ht 73.0 in | Wt 146.0 lb

## 2019-08-21 DIAGNOSIS — C3491 Malignant neoplasm of unspecified part of right bronchus or lung: Secondary | ICD-10-CM | POA: Diagnosis not present

## 2019-08-21 DIAGNOSIS — C3490 Malignant neoplasm of unspecified part of unspecified bronchus or lung: Secondary | ICD-10-CM

## 2019-08-21 DIAGNOSIS — C771 Secondary and unspecified malignant neoplasm of intrathoracic lymph nodes: Secondary | ICD-10-CM | POA: Diagnosis not present

## 2019-08-21 DIAGNOSIS — Z79899 Other long term (current) drug therapy: Secondary | ICD-10-CM | POA: Insufficient documentation

## 2019-08-21 DIAGNOSIS — Z5112 Encounter for antineoplastic immunotherapy: Secondary | ICD-10-CM | POA: Diagnosis not present

## 2019-08-21 DIAGNOSIS — R5383 Other fatigue: Secondary | ICD-10-CM

## 2019-08-21 LAB — CBC WITH DIFFERENTIAL (CANCER CENTER ONLY)
Abs Immature Granulocytes: 0.03 10*3/uL (ref 0.00–0.07)
Basophils Absolute: 0 10*3/uL (ref 0.0–0.1)
Basophils Relative: 0 %
Eosinophils Absolute: 0.4 10*3/uL (ref 0.0–0.5)
Eosinophils Relative: 6 %
HCT: 35.2 % — ABNORMAL LOW (ref 39.0–52.0)
Hemoglobin: 11.3 g/dL — ABNORMAL LOW (ref 13.0–17.0)
Immature Granulocytes: 0 %
Lymphocytes Relative: 15 %
Lymphs Abs: 1.1 10*3/uL (ref 0.7–4.0)
MCH: 30.5 pg (ref 26.0–34.0)
MCHC: 32.1 g/dL (ref 30.0–36.0)
MCV: 95.1 fL (ref 80.0–100.0)
Monocytes Absolute: 0.8 10*3/uL (ref 0.1–1.0)
Monocytes Relative: 12 %
Neutro Abs: 4.6 10*3/uL (ref 1.7–7.7)
Neutrophils Relative %: 67 %
Platelet Count: 245 10*3/uL (ref 150–400)
RBC: 3.7 MIL/uL — ABNORMAL LOW (ref 4.22–5.81)
RDW: 12.2 % (ref 11.5–15.5)
WBC Count: 6.9 10*3/uL (ref 4.0–10.5)
nRBC: 0 % (ref 0.0–0.2)

## 2019-08-21 LAB — CMP (CANCER CENTER ONLY)
ALT: 13 U/L (ref 0–44)
AST: 22 U/L (ref 15–41)
Albumin: 3.6 g/dL (ref 3.5–5.0)
Alkaline Phosphatase: 127 U/L — ABNORMAL HIGH (ref 38–126)
Anion gap: 10 (ref 5–15)
BUN: 35 mg/dL — ABNORMAL HIGH (ref 8–23)
CO2: 27 mmol/L (ref 22–32)
Calcium: 9.4 mg/dL (ref 8.9–10.3)
Chloride: 101 mmol/L (ref 98–111)
Creatinine: 1.64 mg/dL — ABNORMAL HIGH (ref 0.61–1.24)
GFR, Est AFR Am: 48 mL/min — ABNORMAL LOW (ref >60)
GFR, Estimated: 41 mL/min — ABNORMAL LOW (ref >60)
Glucose, Bld: 104 mg/dL — ABNORMAL HIGH (ref 70–99)
Potassium: 4.4 mmol/L (ref 3.5–5.1)
Sodium: 138 mmol/L (ref 135–145)
Total Bilirubin: 0.4 mg/dL (ref 0.3–1.2)
Total Protein: 8.4 g/dL — ABNORMAL HIGH (ref 6.5–8.1)

## 2019-08-21 LAB — TSH: TSH: 3.505 u[IU]/mL (ref 0.320–4.118)

## 2019-08-21 MED ORDER — SODIUM CHLORIDE 0.9 % IV SOLN
Freq: Once | INTRAVENOUS | Status: AC
  Filled 2019-08-21: qty 250

## 2019-08-21 MED ORDER — SODIUM CHLORIDE 0.9 % IV SOLN
400.0000 mg | Freq: Once | INTRAVENOUS | Status: AC
  Administered 2019-08-21: 13:00:00 400 mg via INTRAVENOUS
  Filled 2019-08-21: qty 16

## 2019-08-21 NOTE — Progress Notes (Signed)
Dr. [name] reviewed CBC,CMP today: OK to treat w/creatinine 1.64

## 2019-08-21 NOTE — Patient Instructions (Signed)
[facility] Discharge Instructions for Patients Receiving Chemotherapy  Today you received the following Immunotherapy: Pembrolizumab  To help prevent nausea and vomiting after your treatment, we encourage you to take your nausea medication as directed by your MD.   If you develop nausea and vomiting that is not controlled by your nausea medication, call the clinic.   BELOW ARE SYMPTOMS THAT SHOULD BE REPORTED IMMEDIATELY:  *FEVER GREATER THAN 100.5 F  *CHILLS WITH OR WITHOUT FEVER  NAUSEA AND VOMITING THAT IS NOT CONTROLLED WITH YOUR NAUSEA MEDICATION  *UNUSUAL SHORTNESS OF BREATH  *UNUSUAL BRUISING OR BLEEDING  TENDERNESS IN MOUTH AND THROAT WITH OR WITHOUT PRESENCE OF ULCERS  *URINARY PROBLEMS  *BOWEL PROBLEMS  UNUSUAL RASH Items with * indicate a potential emergency and should be followed up as soon as possible.  Feel free to call the clinic should you have any questions or concerns. The clinic phone number is [phone].  Please show the [facility] at check-in to the Emergency Department and triage nurse. Coronavirus (COVID-19) Are you at risk?  Are you at risk for the Coronavirus (COVID-19)?  To be considered HIGH RISK for Coronavirus (COVID-19), you have to meet the following criteria:  . Traveled to [place], [place], [place], [place] or [place]; or in the [place] to [place], [place], [place], or [place]; and have fever, cough, and shortness of breath within the last 2 weeks of travel OR . Been in close contact with a person diagnosed with COVID-19 within the last 2 weeks and have fever, cough, and shortness of breath . IF YOU DO NOT MEET THESE CRITERIA, YOU ARE CONSIDERED LOW RISK FOR COVID-19.  What to do if you are HIGH RISK for COVID-19?  Marland Kitchen If you are having a medical emergency, call 911. . Seek medical care right away. Before you go to a doctor's office, urgent care or emergency department, call ahead and tell them about  your recent travel, contact with someone diagnosed with COVID-19, and your symptoms. You should receive instructions from your physician's office regarding next steps of care.  . When you arrive at healthcare provider, tell the healthcare staff immediately you have returned from visiting [place], [place], [place], [place] or [place]; or traveled in the [place] to [place], [place], [place], or [place]; in the last two weeks or you have been in close contact with a person diagnosed with COVID-19 in the last 2 weeks.   . Tell the health care staff about your symptoms: fever, cough and shortness of breath. . After you have been seen by a medical provider, you will be either: o Tested for (COVID-19) and discharged home on quarantine except to seek medical care if symptoms worsen, and asked to  - Stay home and avoid contact with others until you get your results (4-5 days)  - Avoid travel on public transportation if possible (such as bus, train, or airplane) or o Sent to the Emergency Department by EMS for evaluation, COVID-19 testing, and possible admission depending on your condition and test results.  What to do if you are LOW RISK for COVID-19?  Reduce your risk of any infection by using the same precautions used for avoiding the common cold or flu:  Marland Kitchen Wash your hands often with soap and warm water for at least 20 seconds.  If soap and water are not readily available, use an alcohol-based hand sanitizer with at least 60% alcohol.  . If coughing or  sneezing, cover your mouth and nose by coughing or sneezing into the elbow areas of your shirt or coat, into a tissue or into your sleeve (not your hands). . Avoid shaking hands with others and consider head nods or verbal greetings only. . Avoid touching your eyes, nose, or mouth with unwashed hands.  . Avoid close contact with people who are sick. . Avoid places or events with large numbers of people in one location, like concerts or sporting  events. . Carefully consider travel plans you have or are making. . If you are planning any travel outside or inside the [place], visit the CDC's Travelers' Health webpage for the latest health notices. . If you have some symptoms but not all symptoms, continue to monitor at home and seek medical attention if your symptoms worsen. . If you are having a medical emergency, call 911.   [place] / e-Visit: [email or web address]         [facility]: [facility]: [phone]                   [facility]: [phone]

## 2019-08-21 NOTE — Progress Notes (Signed)
[facility] OFFICE PROGRESS NOTE   Diagnosis: Non-small cell lung cancer  INTERVAL HISTORY:   [name] returns as scheduled.  He feels well.  Good appetite.  He is exercising.  He wears oxygen at times.  He reports morning headaches for the past few weeks.  The headache resolves after he drinks coffee.  He has sinus drainage he relates to allergies.  No diarrhea.  Mild rash of the dorsum of the hands.  No other rash.  Objective:  Vital signs in last 24 hours:  Blood pressure (!) 156/80, pulse 84, temperature 98.5 F (36.9 C), temperature source Temporal, resp. rate 17, height _0  (1.854 m), weight 146 lb (66.2 kg), SpO2 98 %.   Physical examination not performed today secondary to distancing with the Covid pandemic  Lab Results:  Lab Results  Component Value Date   WBC 6.9 08/21/2019   HGB 11.3 (L) 08/21/2019   HCT 35.2 (L) 08/21/2019   MCV 95.1 08/21/2019   PLT 245 08/21/2019   NEUTROABS 4.6 08/21/2019    CMP  Lab Results  Component Value Date   NA 138 08/21/2019   K 4.4 08/21/2019   CL 101 08/21/2019   CO2 27 08/21/2019   GLUCOSE 104 (H) 08/21/2019   BUN 35 (H) 08/21/2019   CREATININE 1.64 (H) 08/21/2019   CALCIUM 9.4 08/21/2019   PROT 8.4 (H) 08/21/2019   ALBUMIN 3.6 08/21/2019   AST 22 08/21/2019   ALT 13 08/21/2019   ALKPHOS 127 (H) 08/21/2019   BILITOT 0.4 08/21/2019   GFRNONAA 41 (L) 08/21/2019   GFRAA 48 (L) 08/21/2019     Medications: I have reviewed the patient's current medications.   Assessment/Plan: 1. Metastatic non-small cell lung cancer  largeright pleural effusion, right lung mass, right pleural-based masses, left lung nodules  Right thoracentesis 09/04/2018-negative cytology  CT biopsy of right lateral pleural mass 09/07/2018-poorly differentiated non-small cell carcinoma, malignant cells positive for cytokeratin AE1/AE3 and cytokeratin 8/18, histology favoring adenocarcinoma, MSS, tumor mutation burden 5.no EGFR,  ALK, BRAF alteration. PDL 1 rearrangement  PDL 1 tumor proportion score-100%   Cycle 1 Alimta/carboplatin 09/22/2018, pembrolizumab 09/28/2018  Cycle 2 Alimta/carboplatin/pembrolizumab 10/15/2018  Cycle 3 Alimta/carboplatin/pembrolizumab 11/06/2018  Cycle4Alimta/carboplatin/pembrolizumab 11/28/2018  CT chest 12/14/2018-response to therapy of lung primary/primaries, thoracic nodal and pulmonary metastasis. Decrease in right pleural effusion with improvement in pleural-based metastasis. New T10 heterogeneous sclerotic density likely due to healing of previously CT occult metastasis.  Pembrolizumab 12/19/2018  Pembrolizumab/Alimta starting 01/09/2019  Pembrolizumab/Alimta 01/31/2019  Pembrolizumab/Alimta 02/21/2019  Pembrolizumab/Alimta 03/14/2019  CT 04/01/2019-no evidence of disease progression, small loculated right pleural effusion and right basilar pleural nodularity-stable, stable left lower lobe nodule, persistent sclerosis at T10  Pembrolizumab/Alimta 04/05/2019  Pembrolizumab 05/07/2019  Pembrolizumab 05/28/2019  Pembrolizumab 06/18/2019  Pembrolizumab 07/09/2019  Chest CT 07/26/2019-moderate to large stable exudative right effusion with considerable pleural thickening.  Nodularity at the right lung base along pleural thickening similar to prior.  Stable left paramediastinal groundglass density nodule.  Stable sclerotic lesion posteriorly at the T10 vertebral level.  Pembrolizumab 07/30/2019  Pembrolizumab 08/21/2019-changed to every 6-week dosing   2.Severe anemia-likely secondary to bleeding in the right pleural space and metastatic carcinoma, red cell transfusions 09/09/2018 09/10/2018, 09/23/2018-improved;progressive 04/26/2019, red cell transfusion. 3.Dyspnea secondary to the large right pleural effusion and anemia  Right Pleurx catheter placed 09/11/2018  Chest x-ray 10/09/2018-large right pleural effusion, loculated  Chest x-ray 11/06/2018-no interval change in the  right-sided pleural effusion  Pleurx drainage catheter removed 11/13/2018 4.History of  tobacco use 5.History of kidney stones 6. Fever-most likely tumor fever, improved 7. Admission 10/08/2018 with rapid atrial fibrillation/flutter-improved with diltiazem, started on Eliquis anticoagulation. Eliquis discontinued. Now on aspirin. 8.Renal insufficiency      Disposition: Brian Ramirez appears stable.  He will complete a another treatment with pembrolizumab today.  The pembrolizumab will be changed to every 6-week dosing.  He will call for increased headaches or new symptoms.  [name] will return for office visit and pembrolizumab in 6 weeks.  We checked the TSH today.  Betsy Coder, MD  08/21/2019  12:18 PM

## 2019-08-23 ENCOUNTER — Telehealth: Payer: Self-pay | Admitting: Oncology

## 2019-08-23 NOTE — Telephone Encounter (Signed)
Scheduled per los. Called and left msg. Mailed printout  °

## 2019-09-29 ENCOUNTER — Ambulatory Visit: Payer: Medicare Other | Attending: Internal Medicine

## 2019-09-29 ENCOUNTER — Other Ambulatory Visit: Payer: Self-pay | Admitting: Oncology

## 2019-09-29 DIAGNOSIS — Z23 Encounter for immunization: Secondary | ICD-10-CM | POA: Insufficient documentation

## 2019-09-29 NOTE — Progress Notes (Signed)
   Covid-19 Vaccination Clinic  Name:  Brian Ramirez    MRN: [id number] DOB: 11-23-1945  09/29/2019  [name] was observed post Covid-19 immunization for 15 minutes without incidence. He was provided with Vaccine Information Sheet and instruction to access the V-Safe system.   [name] was instructed to call 911 with any severe reactions post vaccine: Marland Kitchen Difficulty breathing  . Swelling of your face and throat  . A fast heartbeat  . A bad rash all over your body  . Dizziness and weakness    Immunizations Administered    Name Date Dose VIS Date Route   Pfizer COVID-19 Vaccine 09/29/2019  1:53 PM 0.3 mL 07/26/2019 Intramuscular   Manufacturer: [place]   Lot: [id number]   [place]: [id number]

## 2019-10-01 ENCOUNTER — Telehealth: Payer: Self-pay | Admitting: Oncology

## 2019-10-01 NOTE — Telephone Encounter (Signed)
R/S appt per Lattie Haw PAL. Pt is aware of change.

## 2019-10-03 ENCOUNTER — Inpatient Hospital Stay: Payer: Medicare Other

## 2019-10-03 ENCOUNTER — Inpatient Hospital Stay: Payer: Medicare Other | Admitting: Nurse Practitioner

## 2019-10-03 ENCOUNTER — Inpatient Hospital Stay: Payer: Medicare Other | Admitting: Oncology

## 2019-10-11 ENCOUNTER — Inpatient Hospital Stay: Payer: Medicare Other | Attending: Oncology

## 2019-10-11 ENCOUNTER — Other Ambulatory Visit: Payer: Self-pay

## 2019-10-11 ENCOUNTER — Inpatient Hospital Stay: Payer: Medicare Other

## 2019-10-11 ENCOUNTER — Inpatient Hospital Stay (HOSPITAL_BASED_OUTPATIENT_CLINIC_OR_DEPARTMENT_OTHER): Payer: Medicare Other | Admitting: Nurse Practitioner

## 2019-10-11 ENCOUNTER — Encounter: Payer: Self-pay | Admitting: Nurse Practitioner

## 2019-10-11 VITALS — BP 160/81 | HR 86 | Temp 98.5°F | Resp 17 | Ht 73.0 in | Wt 144.5 lb

## 2019-10-11 DIAGNOSIS — D649 Anemia, unspecified: Secondary | ICD-10-CM | POA: Diagnosis not present

## 2019-10-11 DIAGNOSIS — C771 Secondary and unspecified malignant neoplasm of intrathoracic lymph nodes: Secondary | ICD-10-CM | POA: Insufficient documentation

## 2019-10-11 DIAGNOSIS — R5383 Other fatigue: Secondary | ICD-10-CM

## 2019-10-11 DIAGNOSIS — C782 Secondary malignant neoplasm of pleura: Secondary | ICD-10-CM | POA: Diagnosis not present

## 2019-10-11 DIAGNOSIS — Z5112 Encounter for antineoplastic immunotherapy: Secondary | ICD-10-CM | POA: Diagnosis not present

## 2019-10-11 DIAGNOSIS — C3490 Malignant neoplasm of unspecified part of unspecified bronchus or lung: Secondary | ICD-10-CM | POA: Diagnosis not present

## 2019-10-11 DIAGNOSIS — Z79899 Other long term (current) drug therapy: Secondary | ICD-10-CM | POA: Insufficient documentation

## 2019-10-11 DIAGNOSIS — C3491 Malignant neoplasm of unspecified part of right bronchus or lung: Secondary | ICD-10-CM

## 2019-10-11 LAB — CBC WITH DIFFERENTIAL (CANCER CENTER ONLY)
Abs Immature Granulocytes: 0.02 10*3/uL (ref 0.00–0.07)
Basophils Absolute: 0 10*3/uL (ref 0.0–0.1)
Basophils Relative: 0 %
Eosinophils Absolute: 0.6 10*3/uL — ABNORMAL HIGH (ref 0.0–0.5)
Eosinophils Relative: 8 %
HCT: 35.9 % — ABNORMAL LOW (ref 39.0–52.0)
Hemoglobin: 11.7 g/dL — ABNORMAL LOW (ref 13.0–17.0)
Immature Granulocytes: 0 %
Lymphocytes Relative: 14 %
Lymphs Abs: 1 10*3/uL (ref 0.7–4.0)
MCH: 30.5 pg (ref 26.0–34.0)
MCHC: 32.6 g/dL (ref 30.0–36.0)
MCV: 93.7 fL (ref 80.0–100.0)
Monocytes Absolute: 0.8 10*3/uL (ref 0.1–1.0)
Monocytes Relative: 12 %
Neutro Abs: 4.4 10*3/uL (ref 1.7–7.7)
Neutrophils Relative %: 66 %
Platelet Count: 230 10*3/uL (ref 150–400)
RBC: 3.83 MIL/uL — ABNORMAL LOW (ref 4.22–5.81)
RDW: 12.6 % (ref 11.5–15.5)
WBC Count: 6.7 10*3/uL (ref 4.0–10.5)
nRBC: 0 % (ref 0.0–0.2)

## 2019-10-11 LAB — CMP (CANCER CENTER ONLY)
ALT: 13 U/L (ref 0–44)
AST: 25 U/L (ref 15–41)
Albumin: 3.5 g/dL (ref 3.5–5.0)
Alkaline Phosphatase: 126 U/L (ref 38–126)
Anion gap: 9 (ref 5–15)
BUN: 33 mg/dL — ABNORMAL HIGH (ref 8–23)
CO2: 27 mmol/L (ref 22–32)
Calcium: 9.4 mg/dL (ref 8.9–10.3)
Chloride: 102 mmol/L (ref 98–111)
Creatinine: 1.72 mg/dL — ABNORMAL HIGH (ref 0.61–1.24)
GFR, Est AFR Am: 45 mL/min — ABNORMAL LOW (ref >60)
GFR, Estimated: 39 mL/min — ABNORMAL LOW (ref >60)
Glucose, Bld: 106 mg/dL — ABNORMAL HIGH (ref 70–99)
Potassium: 4.4 mmol/L (ref 3.5–5.1)
Sodium: 138 mmol/L (ref 135–145)
Total Bilirubin: 0.5 mg/dL (ref 0.3–1.2)
Total Protein: 8.1 g/dL (ref 6.5–8.1)

## 2019-10-11 MED ORDER — SODIUM CHLORIDE 0.9 % IV SOLN
400.0000 mg | Freq: Once | INTRAVENOUS | Status: AC
  Administered 2019-10-11: 16:00:00 400 mg via INTRAVENOUS
  Filled 2019-10-11 (×2): qty 16

## 2019-10-11 MED ORDER — SODIUM CHLORIDE 0.9 % IV SOLN
Freq: Once | INTRAVENOUS | Status: AC
  Filled 2019-10-11: qty 250

## 2019-10-11 NOTE — Progress Notes (Signed)
[facility] OFFICE PROGRESS NOTE   Diagnosis: Non-small cell lung cancer  INTERVAL HISTORY:   Mr. Galeana returns as scheduled.  He completed a cycle of pembrolizumab 08/21/2019.  He is now on the every 6-week dosing schedule.  He feels well.  He intermittently notes a sleep disturbance.  He denies shortness of breath.  He has a periodic cough.  No fever.  No rash or diarrhea.  Objective:  Vital signs in last 24 hours:  Blood pressure (!) 160/81, pulse 86, temperature 98.5 F (36.9 C), temperature source Temporal, resp. rate 17, height 6' 1" (1.854 m), weight 144 lb 8 oz (65.5 kg), SpO2 96 %.    HEENT: No thrush or ulcers. Lymphatics: No palpable cervical or supraclavicular lymph nodes. Resp: Breath sounds diminished throughout the right lung field.  No respiratory distress. GI: Abdomen soft and nontender.  No hepatomegaly. Vascular: No leg edema. Neuro: Alert and oriented. Skin: No rash.   Lab Results:  Lab Results  Component Value Date   WBC 6.7 10/11/2019   HGB 11.7 (L) 10/11/2019   HCT 35.9 (L) 10/11/2019   MCV 93.7 10/11/2019   PLT 230 10/11/2019   NEUTROABS 4.4 10/11/2019    Imaging:  No results found.  Medications: I have reviewed the patient's current medications.  Assessment/Plan: 1. Metastatic non-small cell lung cancer  largeright pleural effusion, right lung mass, right pleural-based masses, left lung nodules  Right thoracentesis 09/04/2018-negative cytology  CT biopsy of right lateral pleural mass 09/07/2018-poorly differentiated non-small cell carcinoma, malignant cells positive for cytokeratin AE1/AE3 and cytokeratin 8/18, histology favoring adenocarcinoma, MSS, tumor mutation burden 5.no EGFR, ALK, BRAF alteration. PDL 1 rearrangement  PDL 1 tumor proportion score-100%   Cycle 1 Alimta/carboplatin 09/22/2018, pembrolizumab 09/28/2018  Cycle 2 Alimta/carboplatin/pembrolizumab 10/15/2018  Cycle 3 Alimta/carboplatin/pembrolizumab  11/06/2018  Cycle4Alimta/carboplatin/pembrolizumab 11/28/2018  CT chest 12/14/2018-response to therapy of lung primary/primaries, thoracic nodal and pulmonary metastasis. Decrease in right pleural effusion with improvement in pleural-based metastasis. New T10 heterogeneous sclerotic density likely due to healing of previously CT occult metastasis.  Pembrolizumab 12/19/2018  Pembrolizumab/Alimta starting 01/09/2019  Pembrolizumab/Alimta 01/31/2019  Pembrolizumab/Alimta 02/21/2019  Pembrolizumab/Alimta 03/14/2019  CT 04/01/2019-no evidence of disease progression, small loculated right pleural effusion and right basilar pleural nodularity-stable, stable left lower lobe nodule, persistent sclerosis at T10  Pembrolizumab/Alimta 04/05/2019  Pembrolizumab 05/07/2019  Pembrolizumab 05/28/2019  Pembrolizumab 06/18/2019  Pembrolizumab 07/09/2019  Chest CT 07/26/2019-moderate to large stable exudative right effusion with considerable pleural thickening.  Nodularity at the right lung base along pleural thickening similar to prior.  Stable left paramediastinal groundglass density nodule.  Stable sclerotic lesion posteriorly at the T10 vertebral level.  Pembrolizumab 07/30/2019  Pembrolizumab 08/21/2019-changed to every 6-week dosing  Pembrolizumab 10/11/2019   2.Severe anemia-likely secondary to bleeding in the right pleural space and metastatic carcinoma, red cell transfusions 09/09/2018 09/10/2018, 09/23/2018-improved;progressive 04/26/2019, red cell transfusion. 3.Dyspnea secondary to the large right pleural effusion and anemia  Right Pleurx catheter placed 09/11/2018  Chest x-ray 10/09/2018-large right pleural effusion, loculated  Chest x-ray 11/06/2018-no interval change in the right-sided pleural effusion  Pleurx drainage catheter removed 11/13/2018 4.History of tobacco use 5.History of kidney stones 6. Fever-most likely tumor fever, improved 7. Admission 10/08/2018 with rapid  atrial fibrillation/flutter-improved with diltiazem, started on Eliquis anticoagulation. Eliquis discontinued. Now on aspirin. 8.Renal insufficiency    Disposition: Mr. Gobin appears well.  There is no clinical evidence of disease progression.  Plan to continue pembrolizumab every 6 weeks, treatment today.  CBC and chemistry panel reviewed.    Labs adequate to proceed with treatment.  He will return for lab, follow-up, pembrolizumab in 6 weeks.  He will contact the office in the interim with any problems.    Lisa Faust ANP/GNP-BC   10/11/2019  2:43 PM        

## 2019-10-11 NOTE — Progress Notes (Signed)
Per Ned Card NP, OK to treat with serum Cr 1.72.

## 2019-10-11 NOTE — Patient Instructions (Signed)
[facility] Discharge Instructions for Patients Receiving Chemotherapy  Today you received the following chemotherapy agents:  Keytruda.  To help prevent nausea and vomiting after your treatment, we encourage you to take your nausea medication as directed.   If you develop nausea and vomiting that is not controlled by your nausea medication, call the clinic.   BELOW ARE SYMPTOMS THAT SHOULD BE REPORTED IMMEDIATELY:  *FEVER GREATER THAN 100.5 F  *CHILLS WITH OR WITHOUT FEVER  NAUSEA AND VOMITING THAT IS NOT CONTROLLED WITH YOUR NAUSEA MEDICATION  *UNUSUAL SHORTNESS OF BREATH  *UNUSUAL BRUISING OR BLEEDING  TENDERNESS IN MOUTH AND THROAT WITH OR WITHOUT PRESENCE OF ULCERS  *URINARY PROBLEMS  *BOWEL PROBLEMS  UNUSUAL RASH Items with * indicate a potential emergency and should be followed up as soon as possible.  Feel free to call the clinic should you have any questions or concerns. The clinic phone number is [phone].  Please show the CHEMO ALERT CARD at check-in to the Emergency Department and triage nurse.    

## 2019-10-14 ENCOUNTER — Telehealth: Payer: Self-pay | Admitting: Oncology

## 2019-10-14 NOTE — Telephone Encounter (Signed)
Scheduled per 2/26 los. Called and left msg about 4/9 appt. Mailed printout

## 2019-10-17 ENCOUNTER — Other Ambulatory Visit: Payer: Self-pay

## 2019-10-17 ENCOUNTER — Ambulatory Visit (INDEPENDENT_AMBULATORY_CARE_PROVIDER_SITE_OTHER): Payer: Medicare Other | Admitting: Cardiology

## 2019-10-17 ENCOUNTER — Encounter: Payer: Self-pay | Admitting: Cardiology

## 2019-10-17 VITALS — BP 140/76 | HR 73 | Ht 73.0 in | Wt 145.2 lb

## 2019-10-17 DIAGNOSIS — R918 Other nonspecific abnormal finding of lung field: Secondary | ICD-10-CM | POA: Diagnosis not present

## 2019-10-17 DIAGNOSIS — I5033 Acute on chronic diastolic (congestive) heart failure: Secondary | ICD-10-CM

## 2019-10-17 DIAGNOSIS — I480 Paroxysmal atrial fibrillation: Secondary | ICD-10-CM

## 2019-10-17 NOTE — Progress Notes (Signed)
Cardiology Office Note:    Date:  10/17/2019   ID:  Brian Ramirez, DOB 21-May-1946, MRN [id number]  PCP:  [name], MD  Cardiologist:  [name], MD  Electrophysiologist:  None   Referring MD: [name], MD   Chief complaint:   History of Present Illness:    Brian Ramirez is a 74 y.o. male with a hx of of metastatic non-small cell lung CA, right pleural effusion status post Pleurex catheter, anemia was found to have new onset atrial flutter with RVR during blood transfusion for anemia 10/11/2018.  2D echo normal LVEF 55% left atrium was moderately dilated.  Patient was placed on diltiazem and amiodarone initially with plans not to use long-term because of lung cancer. The plan was to d/c amiodarone at the follow up visit if he remains in SR.  Discharged on Eliquis.  ChadsVasc equals 1 but with his cancer he may be at increased risk of stroke. He did have a bloody pleural effusion.  If he continues to bleed recommend aspirin 81 mg daily.  Lower extremity edema improved with Lasix.  He was seen by [name] on 10/14/2018, he was in normal sinus rhythm.  Dr. [name] decreased his Lasix to once a day and he has had no further swelling.  03/07/2019 - 3 months follow up, the patient continues to be treated for metastatic non-small cell lung cancer with largeright pleural effusion, right lung mass, right pleural-based masses, left lung nodules. Chest CT 12/14/2018 showed response to therapy of lung primary/primaries, thoracic nodal and pulmonary metastasis. Decrease in right pleural effusion with improvement in pleural-based metastasis. New T10 heterogeneous sclerotic density likely due to healing of previously CT occult metastasis. He is on therapy with Pembrolizumab. He has severe anemia-likely secondary to bleeding in the right pleural space and metastatic carcinoma, red cell transfusions 09/09/2018 09/10/2018, 09/23/2018-improved, most recent Hb 9.4 on 02/21/2019. He is having  on and off lower extremity edema only in his left leg that is chronic and goes away by the morning.  He feels occasional palpitations with regards to extra beats.  No longer palpitations no dizziness or syncope.  No chest pain.  06/14/2019 -the patient develop acute kidney failure believed to be secondary to chemotherapy that was discontinued, his creatinine went up from 0.9-2.0 now improved to 1.7, he states that his lower extremity edema has resolved, he has no orthopnea or proximal nocturnal dyspnea.  He denies any chest pain and has stable dyspnea on exertion he continues to use 24/7 home oxygen.  In the last 3 months he only had one episode of palpitations in August that started at night his pulse ox showed a heart rate of 150 he went to bed and woke up with a heart rate of 70.  No other episodes since then.  No bleeding.  10/17/2019 - 6 months follow up, he is feeling okay, he continues therapy for his lung cancer, currently on pembrolizumab every 6 weeks with minimal side effects, last dose on 08/21/2019.  He overall has been losing weight and has lost over 20 pounds in the last 2 years, he is trying to exercise as much as he can to keep his muscles strong.  He has good appetite.  He denies any chest pain, no palpitations.  Past Medical History:  Diagnosis Date  . Acute congestive heart failure ([place])   . Anemia of chronic disease 10/08/2018  . Anticoagulated   . Atrial flutter with rapid ventricular response ([place]) 10/08/2018  .  Difficult airway for intubation 09/26/2016  . Edema of both lower extremities 10/09/2018  . Goals of care, counseling/discussion 09/20/2018  . Hernia 2012  . History of hiatal hernia   . History of kidney stones   . Left inguinal hernia s/p lap repair 11/22/2016 09/26/2016  . Lung cancer ([place]) 09/20/2018  . Lung mass 09/04/2018  . Malnutrition of moderate degree 09/20/2018  . Mass of right lung 09/04/2018  . New onset atrial fibrillation ([place])   . Personal history of kidney  stones 1992  . Pleural effusion 09/04/2018  . Pleural effusion on right   . Pressure injury of skin 09/20/2018  . SOB (shortness of breath) 09/19/2018    Past Surgical History:  Procedure Laterality Date  . HERNIA REPAIR  2012   inguinal  . INGUINAL HERNIA REPAIR Left 11/22/2016   Procedure: LAPAROSCOPIC  REPAIR OF LEFT INGUINAL HERNIA WITH MESH;  Surgeon: [name], MD;  Location: [facility] ORS;  Service: General;  Laterality: Left;  . IR INSTILL VIA CHEST TUBE AGENT FOR FIBRINOLYSIS INI DAY  10/10/2018  . IR INSTILL VIA CHEST TUBE AGENT FOR FIBRINOLYSIS INI DAY  10/31/2018  . IR PERC PLEURAL DRAIN W/INDWELL CATH W/IMG GUIDE  09/11/2018  . IR REMOVAL OF PLURAL CATH W/CUFF  11/13/2018    Current Medications: Current Meds  Medication Sig  . Artificial Tear Ointment (DRY EYES OP) Apply 1 drop to eye as needed. Dry, itching eyes  . aspirin EC 81 MG tablet Take 1 tablet (81 mg total) by mouth daily.  Marland Kitchen diltiazem (CARDIZEM CD) 120 MG 24 hr capsule Take 1 capsule (120 mg total) by mouth daily.  . diphenhydrAMINE HCl (BENADRYL ALLERGY PO) Take by mouth as needed.   . docusate sodium (COLACE) 100 MG capsule Take 100 mg by mouth daily.   . fexofenadine (ALLEGRA) 180 MG tablet Take 180 mg by mouth as needed for allergies or rhinitis.  Marland Kitchen prochlorperazine (COMPAZINE) 10 MG tablet Take 1 tablet (10 mg total) by mouth every 6 (six) hours as needed.     Allergies:   Patient has no known allergies.   Social History   Socioeconomic History  . Marital status: Single    Spouse name: Not on file  . Number of children: Not on file  . Years of education: Not on file  . Highest education level: Not on file  Occupational History  . Not on file  Tobacco Use  . Smoking status: Never Smoker  . Smokeless tobacco: Never Used  Substance and Sexual Activity  . Alcohol use: Yes    Alcohol/week: 1.0 standard drinks    Types: 1 Glasses of wine per week  . Drug use: No  . Sexual activity: Not on file  Other  Topics Concern  . Not on file  Social History Narrative  . Not on file   Social Determinants of Health   Financial Resource Strain:   . Difficulty of Paying Living Expenses: Not on file  Food Insecurity:   . Worried About Charity fundraiser in the Last Year: Not on file  . Ran Out of Food in the Last Year: Not on file  Transportation Needs:   . Lack of Transportation (Medical): Not on file  . Lack of Transportation (Non-Medical): Not on file  Physical Activity:   . Days of Exercise per Week: Not on file  . Minutes of Exercise per Session: Not on file  Stress:   . Feeling of Stress : Not on file  Social Connections:   . Frequency of Communication with Friends and Family: Not on file  . Frequency of Social Gatherings with Friends and Family: Not on file  . Attends Religious Services: Not on file  . Active Member of Clubs or Organizations: Not on file  . Attends Archivist Meetings: Not on file  . Marital Status: Not on file     Family History: The patient's family history includes Cancer in his brother; Heart disease in his father.  ROS:   Please see the history of present illness.    All other systems reviewed and are negative.  EKGs/Labs/Other Studies Reviewed:    The following studies were reviewed today:  TTE 10/09/2018  Echo 10/09/2018 1. The left ventricle has a visually estimated ejection fraction of of 55%. The cavity size was normal. Left ventricular diastolic parameters were normal No evidence of left ventricular regional wall motion abnormalities. 2. The right ventricle has normal systolic function. The cavity was normal. There is no increase in right ventricular wall thickness. 3. Left atrial size was moderately dilated. 4. The tricuspid valve is normal in structure. 5. The aortic valve is tricuspid Mild calcification of the aortic valve. no stenosis of the aortic valve. 6. The pulmonic valve was normal in structure. 7. The aortic root and  ascending aorta are normal in size and structure. 8. The mitral valve is normal in structure. No evidence of mitral valve stenosis. Trivial mitral regurgitation. 9. Normal IVC size. PA systolic pressure 37 mmHg.  EKG:  EKG is ordered today.  The ekg ordered today demonstrates sinus rhythm, PACs, unchanged from prior.  This was personally reviewed.  Recent Labs: 08/21/2019: TSH 3.505 10/11/2019: ALT 13; BUN 33; Creatinine 1.72; Hemoglobin 11.7; Platelet Count 230; Potassium 4.4; Sodium 138  Recent Lipid Panel No results found for: CHOL, TRIG, HDL, CHOLHDL, VLDL, LDLCALC, LDLDIRECT  Physical Exam:    VS:  BP 140/76   Pulse 73   Ht 6\' 1"  (1.854 m)   Wt 145 lb 3.2 oz (65.9 kg)   SpO2 97%   BMI 19.16 kg/m     Wt Readings from Last 3 Encounters:  10/17/19 145 lb 3.2 oz (65.9 kg)  10/11/19 144 lb 8 oz (65.5 kg)  08/21/19 146 lb (66.2 kg)    GEN: Cachectic, well developed in no acute distress HEENT: Normal NECK: No JVD; No carotid bruits LYMPHATICS: No lymphadenopathy CARDIAC: RRR, no murmurs, rubs, gallops RESPIRATORY:  Clear to auscultation without rales, wheezing or rhonchi  ABDOMEN: Soft, non-tender, non-distended MUSCULOSKELETAL:  No edema; No deformity  SKIN: Warm and dry NEUROLOGIC:  Alert and oriented x 3 PSYCHIATRIC:  Normal affect   ASSESSMENT:    1. Paroxysmal atrial fibrillation (HCC)   2. Mass of right lung   3. Acute on chronic diastolic CHF (congestive heart failure), NYHA class 3 (HCC)    PLAN:    In order of problems listed above:  New onset atrial fibrillation in the setting of anemia during blood transfusion treated with amiodarone and diltiazem. Off amiodarone, remains in SR, regular HR in 70', on Cardizem to 120 mg po daily. CHADS-VASc 1, we discontinued Eliquis and he is currently just on aspirin.  Chronic diastolic CHF -this has resolved, he is rather dry, advised to increase his fluid intake as post creatinine and BUN are elevated.  Anemia  -improved, most recent hemoglobin 11.7 on October 11, 2019  Metastatic non-small cell lung CA followed by oncology, plan as described in HPI, now off  chemotherapy.  Medication Adjustments/Labs and Tests Ordered: Current medicines are reviewed at length with the patient today.  Concerns regarding medicines are outlined above.  Orders Placed This Encounter  Procedures  . EKG 12-Lead   No orders of the defined types were placed in this encounter.   Patient Instructions  Medication Instructions:   Your physician recommends that you continue on your current medications as directed. Please refer to the Current Medication list given to you today.  *If you need a refill on your cardiac medications before your next appointment, please call your pharmacy*    Follow-Up: At [facility], you and your health needs are our priority.  As part of our continuing mission to provide you with exceptional heart care, we have created designated Provider Care Teams.  These Care Teams include your primary Cardiologist (physician) and Advanced Practice Providers (APPs -  Physician Assistants and Nurse Practitioners) who all work together to provide you with the care you need, when you need it.  We recommend signing up for the patient portal called "MyChart".  Sign up information is provided on this After Visit Summary.  MyChart is used to connect with patients for Virtual Visits (Telemedicine).  Patients are able to view lab/test results, encounter notes, upcoming appointments, etc.  Non-urgent messages can be sent to your provider as well.   To learn more about what you can do with MyChart, go to [email or web address].    Your next appointment:   6 month(s)  The format for your next appointment:   In Person  Provider:   [name], MD       Signed, [name], MD  10/17/2019 12:10 PM    [place]

## 2019-10-17 NOTE — Patient Instructions (Signed)

## 2019-10-22 ENCOUNTER — Ambulatory Visit: Payer: Medicare Other | Attending: Internal Medicine

## 2019-10-22 DIAGNOSIS — Z23 Encounter for immunization: Secondary | ICD-10-CM | POA: Insufficient documentation

## 2019-10-22 NOTE — Progress Notes (Signed)
   Covid-19 Vaccination Clinic  Name:  ORYAN WINTERTON    MRN: [id number] DOB: Jan 02, 1946  10/22/2019  Mr. Stairs was observed post Covid-19 immunization for 15 minutes without incident. He was provided with Vaccine Information Sheet and instruction to access the V-Safe system.   Mr. Cloward was instructed to call 911 with any severe reactions post vaccine: Marland Kitchen Difficulty breathing  . Swelling of face and throat  . A fast heartbeat  . A bad rash all over body  . Dizziness and weakness   Immunizations Administered    Name Date Dose VIS Date Route   Pfizer COVID-19 Vaccine 10/22/2019  2:09 PM 0.3 mL 07/26/2019 Intramuscular   Manufacturer: [place]   Lot: [id number]   [place]: [id number]

## 2019-10-23 ENCOUNTER — Ambulatory Visit: Payer: Medicare Other

## 2019-11-17 ENCOUNTER — Other Ambulatory Visit: Payer: Self-pay | Admitting: Oncology

## 2019-11-22 ENCOUNTER — Other Ambulatory Visit: Payer: Self-pay | Admitting: *Deleted

## 2019-11-22 ENCOUNTER — Other Ambulatory Visit: Payer: Self-pay

## 2019-11-22 ENCOUNTER — Inpatient Hospital Stay: Payer: Medicare Other | Attending: Oncology | Admitting: Oncology

## 2019-11-22 ENCOUNTER — Inpatient Hospital Stay: Payer: Medicare Other

## 2019-11-22 VITALS — BP 148/89 | HR 99 | Temp 98.7°F | Resp 18 | Ht 73.0 in | Wt 144.2 lb

## 2019-11-22 DIAGNOSIS — C3490 Malignant neoplasm of unspecified part of unspecified bronchus or lung: Secondary | ICD-10-CM | POA: Diagnosis not present

## 2019-11-22 DIAGNOSIS — C3491 Malignant neoplasm of unspecified part of right bronchus or lung: Secondary | ICD-10-CM | POA: Diagnosis not present

## 2019-11-22 DIAGNOSIS — Z5112 Encounter for antineoplastic immunotherapy: Secondary | ICD-10-CM | POA: Insufficient documentation

## 2019-11-22 DIAGNOSIS — C771 Secondary and unspecified malignant neoplasm of intrathoracic lymph nodes: Secondary | ICD-10-CM | POA: Diagnosis not present

## 2019-11-22 DIAGNOSIS — Z79899 Other long term (current) drug therapy: Secondary | ICD-10-CM | POA: Insufficient documentation

## 2019-11-22 DIAGNOSIS — C782 Secondary malignant neoplasm of pleura: Secondary | ICD-10-CM | POA: Diagnosis not present

## 2019-11-22 DIAGNOSIS — R5383 Other fatigue: Secondary | ICD-10-CM

## 2019-11-22 LAB — CMP (CANCER CENTER ONLY)
ALT: 14 U/L (ref 0–44)
AST: 23 U/L (ref 15–41)
Albumin: 3.6 g/dL (ref 3.5–5.0)
Alkaline Phosphatase: 128 U/L — ABNORMAL HIGH (ref 38–126)
Anion gap: 9 (ref 5–15)
BUN: 37 mg/dL — ABNORMAL HIGH (ref 8–23)
CO2: 23 mmol/L (ref 22–32)
Calcium: 9.5 mg/dL (ref 8.9–10.3)
Chloride: 104 mmol/L (ref 98–111)
Creatinine: 1.86 mg/dL — ABNORMAL HIGH (ref 0.61–1.24)
GFR, Est AFR Am: 41 mL/min — ABNORMAL LOW (ref >60)
GFR, Estimated: 35 mL/min — ABNORMAL LOW (ref >60)
Glucose, Bld: 98 mg/dL (ref 70–99)
Potassium: 4.5 mmol/L (ref 3.5–5.1)
Sodium: 136 mmol/L (ref 135–145)
Total Bilirubin: 0.4 mg/dL (ref 0.3–1.2)
Total Protein: 8 g/dL (ref 6.5–8.1)

## 2019-11-22 LAB — TSH: TSH: 3.776 u[IU]/mL (ref 0.320–4.118)

## 2019-11-22 LAB — CBC WITH DIFFERENTIAL (CANCER CENTER ONLY)
Abs Immature Granulocytes: 0.01 10*3/uL (ref 0.00–0.07)
Basophils Absolute: 0 10*3/uL (ref 0.0–0.1)
Basophils Relative: 0 %
Eosinophils Absolute: 0.6 10*3/uL — ABNORMAL HIGH (ref 0.0–0.5)
Eosinophils Relative: 7 %
HCT: 34.4 % — ABNORMAL LOW (ref 39.0–52.0)
Hemoglobin: 11.2 g/dL — ABNORMAL LOW (ref 13.0–17.0)
Immature Granulocytes: 0 %
Lymphocytes Relative: 15 %
Lymphs Abs: 1.1 10*3/uL (ref 0.7–4.0)
MCH: 30.2 pg (ref 26.0–34.0)
MCHC: 32.6 g/dL (ref 30.0–36.0)
MCV: 92.7 fL (ref 80.0–100.0)
Monocytes Absolute: 0.8 10*3/uL (ref 0.1–1.0)
Monocytes Relative: 11 %
Neutro Abs: 5 10*3/uL (ref 1.7–7.7)
Neutrophils Relative %: 67 %
Platelet Count: 233 10*3/uL (ref 150–400)
RBC: 3.71 MIL/uL — ABNORMAL LOW (ref 4.22–5.81)
RDW: 12.6 % (ref 11.5–15.5)
WBC Count: 7.6 10*3/uL (ref 4.0–10.5)
nRBC: 0 % (ref 0.0–0.2)

## 2019-11-22 MED ORDER — HYDROXYZINE HCL 25 MG PO TABS: 25.0000 mg | ORAL_TABLET | Freq: Three times a day (TID) | ORAL | 0 refills | Status: DC | PRN

## 2019-11-22 MED ORDER — SODIUM CHLORIDE 0.9 % IV SOLN
Freq: Once | INTRAVENOUS | Status: AC
  Filled 2019-11-22: qty 250

## 2019-11-22 MED ORDER — SODIUM CHLORIDE 0.9 % IV SOLN
400.0000 mg | Freq: Once | INTRAVENOUS | Status: AC
  Administered 2019-11-22: 400 mg via INTRAVENOUS
  Filled 2019-11-22: qty 16

## 2019-11-22 NOTE — Progress Notes (Signed)
[facility] OFFICE PROGRESS NOTE   Diagnosis: Non-small cell lung cancer  INTERVAL HISTORY:   Brian Ramirez treated with pembrolizumab on 10/11/2019.  He feels well.  Good appetite.  He is exercising.  He is mowing his lawn.  He wears oxygen intermittently.  He has pruritic erythematous lesions at the dorsum of the hands and forearms.  This has been present for many months.  He would like a prescription for hydroxyzine.  Objective:  Vital signs in last 24 hours:  Blood pressure (!) 148/89, pulse 99, temperature 98.7 F (37.1 C), temperature source Temporal, resp. rate 18, height '6\' 1"'  (1.854 m), weight 144 lb 3.2 oz (65.4 kg), SpO2 98 %.   Lymphatics: No cervical, supraclavicular, axillary, or inguinal nodes Resp: Decreased breath sounds at the right lower posterior chest, no respiratory distress Cardio: Irregular GI: No hepatosplenomegaly Vascular: No leg edema  Skin: Few 1-2 cm areas of erythema at the dorsum of the hands and forearms.    Lab Results:  Lab Results  Component Value Date   WBC 7.6 11/22/2019   HGB 11.2 (L) 11/22/2019   HCT 34.4 (L) 11/22/2019   MCV 92.7 11/22/2019   PLT 233 11/22/2019   NEUTROABS 5.0 11/22/2019    CMP  Lab Results  Component Value Date   NA 138 10/11/2019   K 4.4 10/11/2019   CL 102 10/11/2019   CO2 27 10/11/2019   GLUCOSE 106 (H) 10/11/2019   BUN 33 (H) 10/11/2019   CREATININE 1.72 (H) 10/11/2019   CALCIUM 9.4 10/11/2019   PROT 8.1 10/11/2019   ALBUMIN 3.5 10/11/2019   AST 25 10/11/2019   ALT 13 10/11/2019   ALKPHOS 126 10/11/2019   BILITOT 0.5 10/11/2019   GFRNONAA 39 (L) 10/11/2019   GFRAA 45 (L) 10/11/2019     Medications: I have reviewed the patient's current medications.   Assessment/Plan:  1. Metastatic non-small cell lung cancer  largeright pleural effusion, right lung mass, right pleural-based masses, left lung nodules  Right thoracentesis 09/04/2018-negative cytology  CT biopsy of right  lateral pleural mass 09/07/2018-poorly differentiated non-small cell carcinoma, malignant cells positive for cytokeratin AE1/AE3 and cytokeratin 8/18, histology favoring adenocarcinoma, MSS, tumor mutation burden 5.no EGFR, ALK, BRAF alteration. PDL 1 rearrangement  PDL 1 tumor proportion score-100%   Cycle 1 Alimta/carboplatin 09/22/2018, pembrolizumab 09/28/2018  Cycle 2 Alimta/carboplatin/pembrolizumab 10/15/2018  Cycle 3 Alimta/carboplatin/pembrolizumab 11/06/2018  Cycle4Alimta/carboplatin/pembrolizumab 11/28/2018  CT chest 12/14/2018-response to therapy of lung primary/primaries, thoracic nodal and pulmonary metastasis. Decrease in right pleural effusion with improvement in pleural-based metastasis. New T10 heterogeneous sclerotic density likely due to healing of previously CT occult metastasis.  Pembrolizumab 12/19/2018  Pembrolizumab/Alimta starting 01/09/2019  Pembrolizumab/Alimta 01/31/2019  Pembrolizumab/Alimta 02/21/2019  Pembrolizumab/Alimta 03/14/2019  CT 04/01/2019-no evidence of disease progression, small loculated right pleural effusion and right basilar pleural nodularity-stable, stable left lower lobe nodule, persistent sclerosis at T10  Pembrolizumab/Alimta 04/05/2019  Pembrolizumab 05/07/2019  Pembrolizumab 05/28/2019  Pembrolizumab 06/18/2019  Pembrolizumab 07/09/2019  Chest CT 07/26/2019-moderate to large stable exudative right effusion with considerable pleural thickening.  Nodularity at the right lung base along pleural thickening similar to prior.  Stable left paramediastinal groundglass density nodule.  Stable sclerotic lesion posteriorly at the T10 vertebral level.  Pembrolizumab 07/30/2019  Pembrolizumab 08/21/2019-changed to every 6-week dosing  Pembrolizumab 10/11/2019  Pembrolizumab 11/22/2019   2.Severe anemia-likely secondary to bleeding in the right pleural space and metastatic carcinoma, red cell transfusions 09/09/2018 09/10/2018,  09/23/2018-improved;progressive 04/26/2019, red cell transfusion. 3.Dyspnea secondary to the large right  pleural effusion and anemia  Right Pleurx catheter placed 09/11/2018  Chest x-ray 10/09/2018-large right pleural effusion, loculated  Chest x-ray 11/06/2018-no interval change in the right-sided pleural effusion  Pleurx drainage catheter removed 11/13/2018 4.History of tobacco use 5.History of kidney stones 6. Fever-most likely tumor fever, improved 7. Admission 10/08/2018 with rapid atrial fibrillation/flutter-improved with diltiazem, started on Eliquis anticoagulation. Eliquis discontinued. Now on aspirin. 8.Renal insufficiency    Disposition: Brian Ramirez appears well.  There is no clinical evidence for progression of lung cancer.  He will continue every 6-week pembrolizumab.  He will undergo restaging chest CT prior to the next office visit in 6 weeks.  We will check an EKG to be sure he has not developed recurrent atrial fibrillation.  He will try hydroxyzine for pruritus.  Betsy Coder, MD  11/22/2019  9:43 AM

## 2019-11-22 NOTE — Progress Notes (Signed)
Per Dr. [name]: OK to treat w/creatinine 1.86

## 2019-11-22 NOTE — Patient Instructions (Signed)
[facility] Discharge Instructions for Patients Receiving Chemotherapy  Today you received the following chemotherapy agents Pembrolizumab [facility]).  To help prevent nausea and vomiting after your treatment, we encourage you to take your nausea medication as prescribed.   If you develop nausea and vomiting that is not controlled by your nausea medication, call the clinic.   BELOW ARE SYMPTOMS THAT SHOULD BE REPORTED IMMEDIATELY:  *FEVER GREATER THAN 100.5 F  *CHILLS WITH OR WITHOUT FEVER  NAUSEA AND VOMITING THAT IS NOT CONTROLLED WITH YOUR NAUSEA MEDICATION  *UNUSUAL SHORTNESS OF BREATH  *UNUSUAL BRUISING OR BLEEDING  TENDERNESS IN MOUTH AND THROAT WITH OR WITHOUT PRESENCE OF ULCERS  *URINARY PROBLEMS  *BOWEL PROBLEMS  UNUSUAL RASH Items with * indicate a potential emergency and should be followed up as soon as possible.  Feel free to call the clinic should you have any questions or concerns. The clinic phone number is [phone].  Please show the [place] at check-in to the Emergency Department and triage nurse.

## 2019-11-25 ENCOUNTER — Telehealth: Payer: Self-pay | Admitting: Oncology

## 2019-11-25 NOTE — Telephone Encounter (Signed)
Scheduled per los. Called and left msg. Mailed printout  °

## 2019-12-03 ENCOUNTER — Other Ambulatory Visit: Payer: Medicare Other

## 2019-12-27 DIAGNOSIS — K6289 Other specified diseases of anus and rectum: Secondary | ICD-10-CM | POA: Diagnosis not present

## 2019-12-27 DIAGNOSIS — C3400 Malignant neoplasm of unspecified main bronchus: Secondary | ICD-10-CM | POA: Diagnosis not present

## 2019-12-29 ENCOUNTER — Other Ambulatory Visit: Payer: Self-pay | Admitting: Oncology

## 2020-01-01 ENCOUNTER — Ambulatory Visit
Admission: RE | Admit: 2020-01-01 | Discharge: 2020-01-01 | Disposition: A | Discharge status: Home / Self Care | Payer: Medicare Other | Source: Ambulatory Visit | Attending: Oncology | Admitting: Oncology

## 2020-01-01 ENCOUNTER — Other Ambulatory Visit: Payer: Self-pay

## 2020-01-01 DIAGNOSIS — C3491 Malignant neoplasm of unspecified part of right bronchus or lung: Secondary | ICD-10-CM

## 2020-01-01 DIAGNOSIS — C3490 Malignant neoplasm of unspecified part of unspecified bronchus or lung: Secondary | ICD-10-CM | POA: Diagnosis not present

## 2020-01-03 ENCOUNTER — Other Ambulatory Visit: Payer: Self-pay

## 2020-01-03 ENCOUNTER — Inpatient Hospital Stay (HOSPITAL_BASED_OUTPATIENT_CLINIC_OR_DEPARTMENT_OTHER): Payer: Medicare Other | Admitting: Nurse Practitioner

## 2020-01-03 ENCOUNTER — Inpatient Hospital Stay: Payer: Medicare Other | Attending: Oncology

## 2020-01-03 ENCOUNTER — Encounter: Payer: Self-pay | Admitting: Nurse Practitioner

## 2020-01-03 ENCOUNTER — Inpatient Hospital Stay: Payer: Medicare Other

## 2020-01-03 VITALS — BP 130/81 | HR 76 | Temp 97.5°F | Resp 17 | Ht 73.0 in | Wt 145.4 lb

## 2020-01-03 DIAGNOSIS — C3491 Malignant neoplasm of unspecified part of right bronchus or lung: Secondary | ICD-10-CM

## 2020-01-03 DIAGNOSIS — C3490 Malignant neoplasm of unspecified part of unspecified bronchus or lung: Secondary | ICD-10-CM

## 2020-01-03 DIAGNOSIS — R5383 Other fatigue: Secondary | ICD-10-CM

## 2020-01-03 DIAGNOSIS — Z5112 Encounter for antineoplastic immunotherapy: Secondary | ICD-10-CM | POA: Insufficient documentation

## 2020-01-03 DIAGNOSIS — C771 Secondary and unspecified malignant neoplasm of intrathoracic lymph nodes: Secondary | ICD-10-CM | POA: Diagnosis not present

## 2020-01-03 DIAGNOSIS — C782 Secondary malignant neoplasm of pleura: Secondary | ICD-10-CM | POA: Insufficient documentation

## 2020-01-03 DIAGNOSIS — Z79899 Other long term (current) drug therapy: Secondary | ICD-10-CM | POA: Diagnosis not present

## 2020-01-03 LAB — CBC WITH DIFFERENTIAL (CANCER CENTER ONLY)
Abs Immature Granulocytes: 0.02 10*3/uL (ref 0.00–0.07)
Basophils Absolute: 0 10*3/uL (ref 0.0–0.1)
Basophils Relative: 1 %
Eosinophils Absolute: 0.9 10*3/uL — ABNORMAL HIGH (ref 0.0–0.5)
Eosinophils Relative: 11 %
HCT: 34.3 % — ABNORMAL LOW (ref 39.0–52.0)
Hemoglobin: 11.1 g/dL — ABNORMAL LOW (ref 13.0–17.0)
Immature Granulocytes: 0 %
Lymphocytes Relative: 14 %
Lymphs Abs: 1.1 10*3/uL (ref 0.7–4.0)
MCH: 30.2 pg (ref 26.0–34.0)
MCHC: 32.4 g/dL (ref 30.0–36.0)
MCV: 93.2 fL (ref 80.0–100.0)
Monocytes Absolute: 0.9 10*3/uL (ref 0.1–1.0)
Monocytes Relative: 11 %
Neutro Abs: 5 10*3/uL (ref 1.7–7.7)
Neutrophils Relative %: 63 %
Platelet Count: 209 10*3/uL (ref 150–400)
RBC: 3.68 MIL/uL — ABNORMAL LOW (ref 4.22–5.81)
RDW: 12.4 % (ref 11.5–15.5)
WBC Count: 8 10*3/uL (ref 4.0–10.5)
nRBC: 0 % (ref 0.0–0.2)

## 2020-01-03 LAB — CMP (CANCER CENTER ONLY)
ALT: 17 U/L (ref 0–44)
AST: 25 U/L (ref 15–41)
Albumin: 3.5 g/dL (ref 3.5–5.0)
Alkaline Phosphatase: 124 U/L (ref 38–126)
Anion gap: 10 (ref 5–15)
BUN: 39 mg/dL — ABNORMAL HIGH (ref 8–23)
CO2: 26 mmol/L (ref 22–32)
Calcium: 9.2 mg/dL (ref 8.9–10.3)
Chloride: 104 mmol/L (ref 98–111)
Creatinine: 1.69 mg/dL — ABNORMAL HIGH (ref 0.61–1.24)
GFR, Est AFR Am: 46 mL/min — ABNORMAL LOW (ref >60)
GFR, Estimated: 40 mL/min — ABNORMAL LOW (ref >60)
Glucose, Bld: 99 mg/dL (ref 70–99)
Potassium: 4.5 mmol/L (ref 3.5–5.1)
Sodium: 140 mmol/L (ref 135–145)
Total Bilirubin: 0.3 mg/dL (ref 0.3–1.2)
Total Protein: 7.6 g/dL (ref 6.5–8.1)

## 2020-01-03 MED ORDER — SODIUM CHLORIDE 0.9 % IV SOLN
Freq: Once | INTRAVENOUS | Status: AC
  Filled 2020-01-03: qty 250

## 2020-01-03 MED ORDER — SODIUM CHLORIDE 0.9 % IV SOLN
400.0000 mg | Freq: Once | INTRAVENOUS | Status: AC
  Administered 2020-01-03: 400 mg via INTRAVENOUS
  Filled 2020-01-03: qty 16

## 2020-01-03 NOTE — Progress Notes (Addendum)
[place] OFFICE PROGRESS NOTE   Diagnosis: Non-small cell lung cancer  INTERVAL HISTORY:   Mr. Malena returns as scheduled.  He continues every 6-week pembrolizumab.  He feels well.  No nausea or vomiting.  No diarrhea.  No rash.  He denies fever, cough, shortness of breath.  He has a good appetite.  About 3 weeks ago he developed hemorrhoidal itching, pain at the rectum.  He is now applying a steroid cream.  He has noticed improvement.  He will discontinue the steroid cream after 2 weeks of usage.  Objective:  Vital signs in last 24 hours:  Blood pressure 130/81, pulse 76, temperature (!) 97.5 F (36.4 C), temperature source Temporal, resp. rate 17, height '6\' 1"'  (1.854 m), weight 145 lb 6.4 oz (66 kg), SpO2 98 %.    HEENT: No thrush or ulcers. Resp: Breath sounds diminished right lung field.  No respiratory distress. Cardio: Irregular, rate within normal limits. GI: Abdomen soft and nontender.  No hepatomegaly.  No external hemorrhoids. Vascular: No leg edema.  Lab Results:  Lab Results  Component Value Date   WBC 8.0 01/03/2020   HGB 11.1 (L) 01/03/2020   HCT 34.3 (L) 01/03/2020   MCV 93.2 01/03/2020   PLT 209 01/03/2020   NEUTROABS 5.0 01/03/2020    Imaging:  CT CHEST WO CONTRAST  Result Date: 01/01/2020 CLINICAL DATA:  Restaging lung cancer EXAM: CT CHEST WITHOUT CONTRAST TECHNIQUE: Multidetector CT imaging of the chest was performed following the standard protocol without IV contrast. COMPARISON:  07/26/2019 FINDINGS: Cardiovascular: Similar appearance of calcific changes, atherosclerosis associated with the thoracic aorta. No aneurysmal dilation. Limited assessment of the heart and great vessels in the chest due to lack of intravenous contrast, grossly stable compared to the prior study. Mediastinum/Nodes: No mediastinal lymphadenopathy. No axillary lymphadenopathy. Lungs/Pleura: Chronic appearing loculated effusion in the RIGHT chest with signs of  pleural thickening may be slightly smaller compared to the prior study. Thickness in the midportion approximately 3.7 cm as compared to 4.8 cm. Volume loss along the medial margin of the effusion similar to the prior study. Peripheral grouped nodularity in the RIGHT upper lobe unchanged. Pleural and parenchymal scarring in the RIGHT upper lobe and LEFT lung apex similar to the previous exam. Similar tree-in-bud nodularity anterior to the minor fissure in the RIGHT chest. Signs of bronchial wall thickening and inspissated material in bronchi in the RIGHT middle lobe similar to the previous exam. Airways are patent. No LEFT-sided effusion or consolidation. Lingular bronchiectasis as before. Ground-glass in the lingula without new areas of solid measurable nodularity. Ground-glass attenuation measuring approximately 2.4 x 1.8 as compared to 2.6 x 1.8 cm. Pulmonary emphysema unchanged. Upper Abdomen: No focal, suspicious hepatic lesion. Adrenal glands are normal. Remaining upper abdominal viscera are incompletely imaged, no acute upper abdominal process. Musculoskeletal: Spinal degenerative changes. No acute bone process. Unchanged sclerosis of the T10 vertebral body. No new sclerotic foci. IMPRESSION: 1. Chronic appearing loculated effusion in the RIGHT chest with signs of pleural thickening is smaller than on the prior exam. 2. Similar appearance of tree-in-bud nodularity in the RIGHT upper lobe and LEFT lung apex along with bronchiectatic changes in the lingula and middle lobe. 3. Similar appearance of sclerosis of the T10 vertebral body. No new sclerotic foci. 4. Aortic atherosclerosis. Aortic Atherosclerosis (ICD10-I70.0) and Emphysema (ICD10-J43.9). Electronically Signed   By: Zetta Bills M.D.   On: 01/01/2020 15:05    Medications: I have reviewed the patient's current medications.  Assessment/Plan:  1. Metastatic non-small cell lung cancer  largeright pleural effusion, right lung mass, right  pleural-based masses, left lung nodules  Right thoracentesis 09/04/2018-negative cytology  CT biopsy of right lateral pleural mass 09/07/2018-poorly differentiated non-small cell carcinoma, malignant cells positive for cytokeratin AE1/AE3 and cytokeratin 8/18, histology favoring adenocarcinoma, MSS, tumor mutation burden 5.no EGFR, ALK, BRAF alteration. PDL 1 rearrangement  PDL 1 tumor proportion score-100%   Cycle 1 Alimta/carboplatin 09/22/2018, pembrolizumab 09/28/2018  Cycle 2 Alimta/carboplatin/pembrolizumab 10/15/2018  Cycle 3 Alimta/carboplatin/pembrolizumab 11/06/2018  Cycle4Alimta/carboplatin/pembrolizumab 11/28/2018  CT chest 12/14/2018-response to therapy of lung primary/primaries, thoracic nodal and pulmonary metastasis. Decrease in right pleural effusion with improvement in pleural-based metastasis. New T10 heterogeneous sclerotic density likely due to healing of previously CT occult metastasis.  Pembrolizumab 12/19/2018  Pembrolizumab/Alimta starting 01/09/2019  Pembrolizumab/Alimta 01/31/2019  Pembrolizumab/Alimta 02/21/2019  Pembrolizumab/Alimta 03/14/2019  CT 04/01/2019-no evidence of disease progression, small loculated right pleural effusion and right basilar pleural nodularity-stable, stable left lower lobe nodule, persistent sclerosis at T10  Pembrolizumab/Alimta 04/05/2019  Pembrolizumab 05/07/2019  Pembrolizumab 05/28/2019  Pembrolizumab 06/18/2019  Pembrolizumab 07/09/2019  Chest CT 07/26/2019-moderate to large stable exudative right effusion with considerable pleural thickening. Nodularity at the right lung base along pleural thickening similar to prior. Stable left paramediastinal groundglass density nodule. Stable sclerotic lesion posteriorly at the T10 vertebral level.  Pembrolizumab 07/30/2019  Pembrolizumab 08/21/2019-changed to every 6-week dosing  Pembrolizumab 10/11/2019  Pembrolizumab 11/22/2019  CT chest 01/03/2020-chronic appearing loculated  effusion right chest with signs of pleural thickening smaller than on prior exam; similar appearance of tree-in-bud bud nodularity in the right upper lobe and left lung apex along with bronchiectatic changes in the lingula and middle lobe; similar appearance of sclerosis T10 vertebral body  Pembrolizumab 01/03/2020   2.Severe anemia-likely secondary to bleeding in the right pleural space and metastatic carcinoma, red cell transfusions 09/09/2018 09/10/2018, 09/23/2018-improved;progressive 04/26/2019, red cell transfusion. 3.Dyspnea secondary to the large right pleural effusion and anemia  Right Pleurx catheter placed 09/11/2018  Chest x-ray 10/09/2018-large right pleural effusion, loculated  Chest x-ray 11/06/2018-no interval change in the right-sided pleural effusion  Pleurx drainage catheter removed 11/13/2018 4.History of tobacco use 5.History of kidney stones 6. Fever-most likely tumor fever, improved 7. Admission 10/08/2018 with rapid atrial fibrillation/flutter-improved with diltiazem, started on Eliquis anticoagulation. Eliquis discontinued. Now on aspirin. 8.Renal insufficiency   Disposition: Mr. Deskin appears stable.  There is no clinical evidence of disease progression.  Recent restaging chest CT is stable with no evidence of progression.  Plan to continue every 6-week Pembrolizumab.  We reviewed the CBC and chemistry panel from today.  Labs adequate to proceed with pembrolizumab.  He has stable elevation of the creatinine.  He will return for lab, follow-up, pembrolizumab in 6 weeks.  Patient seen with Dr. [name].    Wesly Whisenant ANP/GNP-BC   01/03/2020  10:31 AM  This was a shared visit with Ned Card.  The restaging CT reveals no evidence of disease progression.  The plan is to continue pembrolizumab.  Will wean oxygen to off as tolerated.  Julieanne Manson, MD

## 2020-01-03 NOTE — Patient Instructions (Signed)
[place] Discharge Instructions for Patients Receiving Chemotherapy  Today you received the following chemotherapy agents Pembrolizumab [facility]).  To help prevent nausea and vomiting after your treatment, we encourage you to take your nausea medication as prescribed.   If you develop nausea and vomiting that is not controlled by your nausea medication, call the clinic.   BELOW ARE SYMPTOMS THAT SHOULD BE REPORTED IMMEDIATELY:  *FEVER GREATER THAN 100.5 F  *CHILLS WITH OR WITHOUT FEVER  NAUSEA AND VOMITING THAT IS NOT CONTROLLED WITH YOUR NAUSEA MEDICATION  *UNUSUAL SHORTNESS OF BREATH  *UNUSUAL BRUISING OR BLEEDING  TENDERNESS IN MOUTH AND THROAT WITH OR WITHOUT PRESENCE OF ULCERS  *URINARY PROBLEMS  *BOWEL PROBLEMS  UNUSUAL RASH Items with * indicate a potential emergency and should be followed up as soon as possible.  Feel free to call the clinic should you have any questions or concerns. The clinic phone number is [phone].  Please show the [place] at check-in to the Emergency Department and triage nurse.

## 2020-01-06 ENCOUNTER — Telehealth: Payer: Self-pay | Admitting: Nurse Practitioner

## 2020-01-06 NOTE — Telephone Encounter (Signed)
Scheduled per los. Called, not able to leave msg. Mailed printout  

## 2020-02-09 ENCOUNTER — Other Ambulatory Visit: Payer: Self-pay | Admitting: Oncology

## 2020-02-14 ENCOUNTER — Inpatient Hospital Stay: Payer: Medicare Other

## 2020-02-14 ENCOUNTER — Other Ambulatory Visit: Payer: Self-pay

## 2020-02-14 ENCOUNTER — Inpatient Hospital Stay: Payer: Medicare Other | Attending: Oncology | Admitting: Oncology

## 2020-02-14 VITALS — BP 149/84 | HR 90 | Temp 97.9°F | Resp 20 | Ht 73.0 in | Wt 145.1 lb

## 2020-02-14 DIAGNOSIS — Z5112 Encounter for antineoplastic immunotherapy: Secondary | ICD-10-CM | POA: Insufficient documentation

## 2020-02-14 DIAGNOSIS — C3490 Malignant neoplasm of unspecified part of unspecified bronchus or lung: Secondary | ICD-10-CM

## 2020-02-14 DIAGNOSIS — C3491 Malignant neoplasm of unspecified part of right bronchus or lung: Secondary | ICD-10-CM | POA: Insufficient documentation

## 2020-02-14 DIAGNOSIS — I4891 Unspecified atrial fibrillation: Secondary | ICD-10-CM

## 2020-02-14 DIAGNOSIS — Z79899 Other long term (current) drug therapy: Secondary | ICD-10-CM | POA: Diagnosis not present

## 2020-02-14 DIAGNOSIS — R5383 Other fatigue: Secondary | ICD-10-CM

## 2020-02-14 LAB — CBC WITH DIFFERENTIAL (CANCER CENTER ONLY)
Abs Immature Granulocytes: 0.03 10*3/uL (ref 0.00–0.07)
Basophils Absolute: 0 10*3/uL (ref 0.0–0.1)
Basophils Relative: 0 %
Eosinophils Absolute: 1.4 10*3/uL — ABNORMAL HIGH (ref 0.0–0.5)
Eosinophils Relative: 14 %
HCT: 36.9 % — ABNORMAL LOW (ref 39.0–52.0)
Hemoglobin: 12.1 g/dL — ABNORMAL LOW (ref 13.0–17.0)
Immature Granulocytes: 0 %
Lymphocytes Relative: 14 %
Lymphs Abs: 1.3 10*3/uL (ref 0.7–4.0)
MCH: 30.5 pg (ref 26.0–34.0)
MCHC: 32.8 g/dL (ref 30.0–36.0)
MCV: 92.9 fL (ref 80.0–100.0)
Monocytes Absolute: 1 10*3/uL (ref 0.1–1.0)
Monocytes Relative: 10 %
Neutro Abs: 6 10*3/uL (ref 1.7–7.7)
Neutrophils Relative %: 62 %
Platelet Count: 227 10*3/uL (ref 150–400)
RBC: 3.97 MIL/uL — ABNORMAL LOW (ref 4.22–5.81)
RDW: 12.6 % (ref 11.5–15.5)
WBC Count: 9.7 10*3/uL (ref 4.0–10.5)
nRBC: 0 % (ref 0.0–0.2)

## 2020-02-14 LAB — CMP (CANCER CENTER ONLY)
ALT: 15 U/L (ref 0–44)
AST: 24 U/L (ref 15–41)
Albumin: 3.6 g/dL (ref 3.5–5.0)
Alkaline Phosphatase: 128 U/L — ABNORMAL HIGH (ref 38–126)
Anion gap: 10 (ref 5–15)
BUN: 36 mg/dL — ABNORMAL HIGH (ref 8–23)
CO2: 24 mmol/L (ref 22–32)
Calcium: 9.6 mg/dL (ref 8.9–10.3)
Chloride: 103 mmol/L (ref 98–111)
Creatinine: 1.7 mg/dL — ABNORMAL HIGH (ref 0.61–1.24)
GFR, Est AFR Am: 46 mL/min — ABNORMAL LOW (ref >60)
GFR, Estimated: 39 mL/min — ABNORMAL LOW (ref >60)
Glucose, Bld: 127 mg/dL — ABNORMAL HIGH (ref 70–99)
Potassium: 4.3 mmol/L (ref 3.5–5.1)
Sodium: 137 mmol/L (ref 135–145)
Total Bilirubin: 0.4 mg/dL (ref 0.3–1.2)
Total Protein: 7.9 g/dL (ref 6.5–8.1)

## 2020-02-14 LAB — TSH: TSH: 4.091 u[IU]/mL (ref 0.320–4.118)

## 2020-02-14 MED ORDER — SODIUM CHLORIDE 0.9 % IV SOLN
Freq: Once | INTRAVENOUS | Status: AC
  Filled 2020-02-14: qty 250

## 2020-02-14 MED ORDER — SODIUM CHLORIDE 0.9 % IV SOLN
400.0000 mg | Freq: Once | INTRAVENOUS | Status: AC
  Administered 2020-02-14: 400 mg via INTRAVENOUS
  Filled 2020-02-14: qty 16

## 2020-02-14 NOTE — Progress Notes (Signed)
[place] OFFICE PROGRESS NOTE   Diagnosis: Non-small cell lung cancer  INTERVAL HISTORY:   Brian Ramirez completed another treatment with pembrolizumab on 01/03/2020.  Good appetite.  He is exercising.  He reports arm soreness after lifting weights.  He bled from a finger after trauma.  He continues anticoagulation therapy.  No dyspnea.  He has a cough with "mucus "production in the mornings.  Objective:  Vital signs in last 24 hours:  Blood pressure (!) 149/84, pulse 90, temperature 97.9 F (36.6 C), temperature source Temporal, resp. rate 20, height '6\' 1"'  (1.854 m), weight 145 lb 1.6 oz (65.8 kg), SpO2 98 %.    Lymphatics: No cervical, supraclavicular, or axillary nodes Resp: Decreased breath sounds at the right compared to the left chest, no respiratory distress Cardio: Irregular GI: No hepatosplenomegaly, nontender Vascular: No leg edema  Skin: No rash  Portacath/PICC-without erythema  Lab Results:  Lab Results  Component Value Date   WBC 9.7 02/14/2020   HGB 12.1 (L) 02/14/2020   HCT 36.9 (L) 02/14/2020   MCV 92.9 02/14/2020   PLT 227 02/14/2020   NEUTROABS 6.0 02/14/2020    CMP  Lab Results  Component Value Date   NA 137 02/14/2020   K 4.3 02/14/2020   CL 103 02/14/2020   CO2 24 02/14/2020   GLUCOSE 127 (H) 02/14/2020   BUN 36 (H) 02/14/2020   CREATININE 1.70 (H) 02/14/2020   CALCIUM 9.6 02/14/2020   PROT 7.9 02/14/2020   ALBUMIN 3.6 02/14/2020   AST 24 02/14/2020   ALT 15 02/14/2020   ALKPHOS 128 (H) 02/14/2020   BILITOT 0.4 02/14/2020   GFRNONAA 39 (L) 02/14/2020   GFRAA 46 (L) 02/14/2020     Medications: I have reviewed the patient's current medications.   Assessment/Plan: 1. Metastatic non-small cell lung cancer  largeright pleural effusion, right lung mass, right pleural-based masses, left lung nodules  Right thoracentesis 09/04/2018-negative cytology  CT biopsy of right lateral pleural mass 09/07/2018-poorly  differentiated non-small cell carcinoma, malignant cells positive for cytokeratin AE1/AE3 and cytokeratin 8/18, histology favoring adenocarcinoma, MSS, tumor mutation burden 5.no EGFR, ALK, BRAF alteration. PDL 1 rearrangement  PDL 1 tumor proportion score-100%   Cycle 1 Alimta/carboplatin 09/22/2018, pembrolizumab 09/28/2018  Cycle 2 Alimta/carboplatin/pembrolizumab 10/15/2018  Cycle 3 Alimta/carboplatin/pembrolizumab 11/06/2018  Cycle4Alimta/carboplatin/pembrolizumab 11/28/2018  CT chest 12/14/2018-response to therapy of lung primary/primaries, thoracic nodal and pulmonary metastasis. Decrease in right pleural effusion with improvement in pleural-based metastasis. New T10 heterogeneous sclerotic density likely due to healing of previously CT occult metastasis.  Pembrolizumab 12/19/2018  Pembrolizumab/Alimta starting 01/09/2019  Pembrolizumab/Alimta 01/31/2019  Pembrolizumab/Alimta 02/21/2019  Pembrolizumab/Alimta 03/14/2019  CT 04/01/2019-no evidence of disease progression, small loculated right pleural effusion and right basilar pleural nodularity-stable, stable left lower lobe nodule, persistent sclerosis at T10  Pembrolizumab/Alimta 04/05/2019  Pembrolizumab 05/07/2019  Pembrolizumab 05/28/2019  Pembrolizumab 06/18/2019  Pembrolizumab 07/09/2019  Chest CT 07/26/2019-moderate to large stable exudative right effusion with considerable pleural thickening. Nodularity at the right lung base along pleural thickening similar to prior. Stable left paramediastinal groundglass density nodule. Stable sclerotic lesion posteriorly at the T10 vertebral level.  Pembrolizumab 07/30/2019  Pembrolizumab 08/21/2019-changed to every 6-week dosing  Pembrolizumab 10/11/2019  Pembrolizumab 11/22/2019  CT chest 01/03/2020-chronic appearing loculated effusion right chest with signs of pleural thickening smaller than on prior exam; similar appearance of tree-in-bud bud nodularity in the right upper lobe  and left lung apex along with bronchiectatic changes in the lingula and middle lobe; similar appearance of sclerosis T10 vertebral  body  Pembrolizumab 01/03/2020  Pembrolizumab 02/14/2020   2.Severe anemia-likely secondary to bleeding in the right pleural space and metastatic carcinoma, red cell transfusions 09/09/2018 09/10/2018, 09/23/2018-improved;progressive 04/26/2019, red cell transfusion. 3.Dyspnea secondary to the large right pleural effusion and anemia  Right Pleurx catheter placed 09/11/2018  Chest x-ray 10/09/2018-large right pleural effusion, loculated  Chest x-ray 11/06/2018-no interval change in the right-sided pleural effusion  Pleurx drainage catheter removed 11/13/2018 4.History of tobacco use 5.History of kidney stones 6. Fever-most likely tumor fever, improved 7. Admission 10/08/2018 with rapid atrial fibrillation/flutter-improved with diltiazem, started on Eliquis anticoagulation. Eliquis discontinued. Now on aspirin. 8.Renal insufficiency  Disposition: Brian Ramirez appears stable.  He is in remission from lung cancer.  He will continue pembrolizumab.  We will refer him to Dr. Meda Coffee for follow-up of the atrial fibrillation.  He has an irregular heart rate today.  We will check an EKG.  Brian Ramirez will return for an office visit and pembrolizumab in 6 weeks.  Betsy Coder, MD  02/14/2020  9:30 AM

## 2020-02-14 NOTE — Progress Notes (Signed)
Per Dr. [name]: OK to treat w/creatinine 1.7

## 2020-02-14 NOTE — Patient Instructions (Signed)
[place] Discharge Instructions for Patients Receiving Chemotherapy  Today you received the following chemotherapy agents Pembrolizumab [facility]).  To help prevent nausea and vomiting after your treatment, we encourage you to take your nausea medication as prescribed.   If you develop nausea and vomiting that is not controlled by your nausea medication, call the clinic.   BELOW ARE SYMPTOMS THAT SHOULD BE REPORTED IMMEDIATELY:  *FEVER GREATER THAN 100.5 F  *CHILLS WITH OR WITHOUT FEVER  NAUSEA AND VOMITING THAT IS NOT CONTROLLED WITH YOUR NAUSEA MEDICATION  *UNUSUAL SHORTNESS OF BREATH  *UNUSUAL BRUISING OR BLEEDING  TENDERNESS IN MOUTH AND THROAT WITH OR WITHOUT PRESENCE OF ULCERS  *URINARY PROBLEMS  *BOWEL PROBLEMS  UNUSUAL RASH Items with * indicate a potential emergency and should be followed up as soon as possible.  Feel free to call the clinic should you have any questions or concerns. The clinic phone number is [phone].  Please show the [place] at check-in to the Emergency Department and triage nurse.

## 2020-02-18 ENCOUNTER — Telehealth: Payer: Self-pay | Admitting: Oncology

## 2020-02-18 NOTE — Telephone Encounter (Signed)
Scheduled appts per 7/2 los. Pt confirmed appt date and time.

## 2020-02-19 ENCOUNTER — Telehealth: Payer: Self-pay

## 2020-02-19 NOTE — Telephone Encounter (Signed)
NOTES ON FILE FROM DR Donivan Scull [facility] [phone], SENT REFERRAL TO SCHEDULING

## 2020-02-24 ENCOUNTER — Encounter: Payer: Self-pay | Admitting: General Practice

## 2020-03-20 ENCOUNTER — Other Ambulatory Visit: Payer: Self-pay | Admitting: Oncology

## 2020-03-27 ENCOUNTER — Inpatient Hospital Stay (HOSPITAL_BASED_OUTPATIENT_CLINIC_OR_DEPARTMENT_OTHER): Payer: Medicare Other | Admitting: Oncology

## 2020-03-27 ENCOUNTER — Other Ambulatory Visit: Payer: Self-pay

## 2020-03-27 ENCOUNTER — Inpatient Hospital Stay: Payer: Medicare Other | Attending: Oncology

## 2020-03-27 ENCOUNTER — Ambulatory Visit: Payer: Medicare Other | Admitting: Oncology

## 2020-03-27 ENCOUNTER — Ambulatory Visit: Payer: Medicare Other

## 2020-03-27 ENCOUNTER — Other Ambulatory Visit: Payer: Medicare Other

## 2020-03-27 ENCOUNTER — Inpatient Hospital Stay: Payer: Medicare Other

## 2020-03-27 VITALS — BP 140/89 | HR 85 | Temp 97.7°F | Resp 18 | Ht 73.0 in | Wt 146.5 lb

## 2020-03-27 DIAGNOSIS — I4891 Unspecified atrial fibrillation: Secondary | ICD-10-CM

## 2020-03-27 DIAGNOSIS — Z79899 Other long term (current) drug therapy: Secondary | ICD-10-CM | POA: Diagnosis not present

## 2020-03-27 DIAGNOSIS — Z5112 Encounter for antineoplastic immunotherapy: Secondary | ICD-10-CM | POA: Diagnosis not present

## 2020-03-27 DIAGNOSIS — C3490 Malignant neoplasm of unspecified part of unspecified bronchus or lung: Secondary | ICD-10-CM | POA: Diagnosis not present

## 2020-03-27 DIAGNOSIS — J910 Malignant pleural effusion: Secondary | ICD-10-CM | POA: Insufficient documentation

## 2020-03-27 LAB — CBC WITH DIFFERENTIAL (CANCER CENTER ONLY)
Abs Immature Granulocytes: 0.02 10*3/uL (ref 0.00–0.07)
Basophils Absolute: 0 10*3/uL (ref 0.0–0.1)
Basophils Relative: 0 %
Eosinophils Absolute: 0.4 10*3/uL (ref 0.0–0.5)
Eosinophils Relative: 5 %
HCT: 37.2 % — ABNORMAL LOW (ref 39.0–52.0)
Hemoglobin: 11.8 g/dL — ABNORMAL LOW (ref 13.0–17.0)
Immature Granulocytes: 0 %
Lymphocytes Relative: 12 %
Lymphs Abs: 1 10*3/uL (ref 0.7–4.0)
MCH: 29.8 pg (ref 26.0–34.0)
MCHC: 31.7 g/dL (ref 30.0–36.0)
MCV: 93.9 fL (ref 80.0–100.0)
Monocytes Absolute: 0.9 10*3/uL (ref 0.1–1.0)
Monocytes Relative: 11 %
Neutro Abs: 5.7 10*3/uL (ref 1.7–7.7)
Neutrophils Relative %: 72 %
Platelet Count: 238 10*3/uL (ref 150–400)
RBC: 3.96 MIL/uL — ABNORMAL LOW (ref 4.22–5.81)
RDW: 12.8 % (ref 11.5–15.5)
WBC Count: 8 10*3/uL (ref 4.0–10.5)
nRBC: 0 % (ref 0.0–0.2)

## 2020-03-27 LAB — CMP (CANCER CENTER ONLY)
ALT: 10 U/L (ref 0–44)
AST: 20 U/L (ref 15–41)
Albumin: 3.3 g/dL — ABNORMAL LOW (ref 3.5–5.0)
Alkaline Phosphatase: 126 U/L (ref 38–126)
Anion gap: 10 (ref 5–15)
BUN: 30 mg/dL — ABNORMAL HIGH (ref 8–23)
CO2: 23 mmol/L (ref 22–32)
Calcium: 10 mg/dL (ref 8.9–10.3)
Chloride: 103 mmol/L (ref 98–111)
Creatinine: 1.65 mg/dL — ABNORMAL HIGH (ref 0.61–1.24)
GFR, Est AFR Am: 47 mL/min — ABNORMAL LOW (ref >60)
GFR, Estimated: 41 mL/min — ABNORMAL LOW (ref >60)
Glucose, Bld: 135 mg/dL — ABNORMAL HIGH (ref 70–99)
Potassium: 4.4 mmol/L (ref 3.5–5.1)
Sodium: 136 mmol/L (ref 135–145)
Total Bilirubin: 0.4 mg/dL (ref 0.3–1.2)
Total Protein: 7.7 g/dL (ref 6.5–8.1)

## 2020-03-27 MED ORDER — SODIUM CHLORIDE 0.9 % IV SOLN
400.0000 mg | Freq: Once | INTRAVENOUS | Status: AC
  Administered 2020-03-27: 400 mg via INTRAVENOUS
  Filled 2020-03-27: qty 16

## 2020-03-27 MED ORDER — SODIUM CHLORIDE 0.9 % IV SOLN
Freq: Once | INTRAVENOUS | Status: AC
  Filled 2020-03-27: qty 250

## 2020-03-27 NOTE — Progress Notes (Signed)
[place] OFFICE PROGRESS NOTE   Diagnosis: Non-small cell lung cancer  INTERVAL HISTORY:   Mr. Twist completed another treatment with pembrolizumab on 02/14/2020.  No rash or diarrhea.  Good appetite and energy level.  He is exercising.  He developed congestion and drainage from the right ear 1 week ago.  He also had sinus congestion.  He feels "full "in the head.  No neurologic symptoms.  The right ear drainage has resolved.  Objective:  Vital signs in last 24 hours:  Blood pressure 140/89, pulse 85, temperature 97.7 F (36.5 C), temperature source Axillary, resp. rate 18, height '6\' 1"'  (1.854 m), weight 146 lb 8 oz (66.5 kg), SpO2 97 %.    HEENT: No sinus tenderness, there is superficial ulceration at the edge of the right external canal, cerumen in the right external canal Lymphatics: No cervical, supraclavicular, or axillary nodes Resp: Decreased breath sounds over the right chest, no respiratory distress Cardio: Regular rhythm with premature beats GI: No hepatosplenomegaly Vascular: No leg edema Skin: No rash    Lab Results:  Lab Results  Component Value Date   WBC 8.0 03/27/2020   HGB 11.8 (L) 03/27/2020   HCT 37.2 (L) 03/27/2020   MCV 93.9 03/27/2020   PLT 238 03/27/2020   NEUTROABS 5.7 03/27/2020    CMP  Lab Results  Component Value Date   NA 137 02/14/2020   K 4.3 02/14/2020   CL 103 02/14/2020   CO2 24 02/14/2020   GLUCOSE 127 (H) 02/14/2020   BUN 36 (H) 02/14/2020   CREATININE 1.70 (H) 02/14/2020   CALCIUM 9.6 02/14/2020   PROT 7.9 02/14/2020   ALBUMIN 3.6 02/14/2020   AST 24 02/14/2020   ALT 15 02/14/2020   ALKPHOS 128 (H) 02/14/2020   BILITOT 0.4 02/14/2020   GFRNONAA 39 (L) 02/14/2020   GFRAA 46 (L) 02/14/2020     Medications: I have reviewed the patient's current medications.   Assessment/Plan:  1. Metastatic non-small cell lung cancer  largeright pleural effusion, right lung mass, right pleural-based masses, left  lung nodules  Right thoracentesis 09/04/2018-negative cytology  CT biopsy of right lateral pleural mass 09/07/2018-poorly differentiated non-small cell carcinoma, malignant cells positive for cytokeratin AE1/AE3 and cytokeratin 8/18, histology favoring adenocarcinoma, MSS, tumor mutation burden 5.no EGFR, ALK, BRAF alteration. PDL 1 rearrangement  PDL 1 tumor proportion score-100%   Cycle 1 Alimta/carboplatin 09/22/2018, pembrolizumab 09/28/2018  Cycle 2 Alimta/carboplatin/pembrolizumab 10/15/2018  Cycle 3 Alimta/carboplatin/pembrolizumab 11/06/2018  Cycle4Alimta/carboplatin/pembrolizumab 11/28/2018  CT chest 12/14/2018-response to therapy of lung primary/primaries, thoracic nodal and pulmonary metastasis. Decrease in right pleural effusion with improvement in pleural-based metastasis. New T10 heterogeneous sclerotic density likely due to healing of previously CT occult metastasis.  Pembrolizumab 12/19/2018  Pembrolizumab/Alimta starting 01/09/2019  Pembrolizumab/Alimta 01/31/2019  Pembrolizumab/Alimta 02/21/2019  Pembrolizumab/Alimta 03/14/2019  CT 04/01/2019-no evidence of disease progression, small loculated right pleural effusion and right basilar pleural nodularity-stable, stable left lower lobe nodule, persistent sclerosis at T10  Pembrolizumab/Alimta 04/05/2019  Pembrolizumab 05/07/2019  Pembrolizumab 05/28/2019  Pembrolizumab 06/18/2019  Pembrolizumab 07/09/2019  Chest CT 07/26/2019-moderate to large stable exudative right effusion with considerable pleural thickening. Nodularity at the right lung base along pleural thickening similar to prior. Stable left paramediastinal groundglass density nodule. Stable sclerotic lesion posteriorly at the T10 vertebral level.  Pembrolizumab 07/30/2019  Pembrolizumab 08/21/2019-changed to every 6-week dosing  Pembrolizumab 10/11/2019  Pembrolizumab 11/22/2019  CT chest 01/03/2020-chronic appearing loculated effusion right chest with signs  of pleural thickening smaller than on prior exam; similar appearance  of tree-in-bud bud nodularity in the right upper lobe and left lung apex along with bronchiectatic changes in the lingula and middle lobe; similar appearance of sclerosis T10 vertebral body  Pembrolizumab 01/03/2020  Pembrolizumab 02/14/2020  Pembrolizumab 03/27/2020   2.Severe anemia-likely secondary to bleeding in the right pleural space and metastatic carcinoma, red cell transfusions 09/09/2018 09/10/2018, 09/23/2018-improved;progressive 04/26/2019, red cell transfusion.  Improved. 3.Dyspnea secondary to the large right pleural effusion and anemia  Right Pleurx catheter placed 09/11/2018  Chest x-ray 10/09/2018-large right pleural effusion, loculated  Chest x-ray 11/06/2018-no interval change in the right-sided pleural effusion  Pleurx drainage catheter removed 11/13/2018 4.History of tobacco use 5.History of kidney stones 6. Fever-most likely tumor fever, improved 7. Admission 10/08/2018 with rapid atrial fibrillation/flutter-improved with diltiazem, started on Eliquis anticoagulation. Eliquis discontinued. Now on aspirin. 8.Renal insufficiency    Disposition: Brian Ramirez continues to tolerate the pembrolizumab well.  He will complete another treatment today.  He will undergo a restaging CT evaluation prior to an office visit in 6 weeks.  I suspect he had a sinus/ear infection last week that is resolving.  I recommended he follow-up with Dr. Dorthy Cooler for recurrent symptoms.  I cautioned him against using Q-tips to clean the right ear.  I doubt the symptoms were related to the cancer diagnosis or pembrolizumab.  Betsy Coder, MD  03/27/2020  11:16 AM

## 2020-03-27 NOTE — Patient Instructions (Signed)
[place] Discharge Instructions for Patients Receiving Chemotherapy  Today you received the following chemotherapy agents Pembrolizumab [facility]).  To help prevent nausea and vomiting after your treatment, we encourage you to take your nausea medication as prescribed.   If you develop nausea and vomiting that is not controlled by your nausea medication, call the clinic.   BELOW ARE SYMPTOMS THAT SHOULD BE REPORTED IMMEDIATELY:  *FEVER GREATER THAN 100.5 F  *CHILLS WITH OR WITHOUT FEVER  NAUSEA AND VOMITING THAT IS NOT CONTROLLED WITH YOUR NAUSEA MEDICATION  *UNUSUAL SHORTNESS OF BREATH  *UNUSUAL BRUISING OR BLEEDING  TENDERNESS IN MOUTH AND THROAT WITH OR WITHOUT PRESENCE OF ULCERS  *URINARY PROBLEMS  *BOWEL PROBLEMS  UNUSUAL RASH Items with * indicate a potential emergency and should be followed up as soon as possible.  Feel free to call the clinic should you have any questions or concerns. The clinic phone number is [phone].  Please show the [place] at check-in to the Emergency Department and triage nurse.

## 2020-03-27 NOTE — Progress Notes (Signed)
Creatnine today is 1.65, ok to treat per Dr. [name].

## 2020-03-30 ENCOUNTER — Telehealth: Payer: Self-pay | Admitting: Nurse Practitioner

## 2020-03-30 NOTE — Telephone Encounter (Signed)
Scheduled appointments per 8/13 los; Patient is aware of appointments dates and times. Patient was unsure of one appointment that was scheduled for lab work so I messaged the nurse to confirm appointment. Patient is aware that any appointment changes will be updated in MyChart.

## 2020-04-08 IMAGING — CT CT ANGIO CHEST
2 of 7 series · 18 of 46 positions shown · IV contrast (ISOVUE 370)
Comparison: Chest radiograph dated 09/04/2018

CLINICAL DATA: Pleural effusion and pulmonary nodules (suspicious
for neoplasm) on chest radiograph. Shortness of breath. Evaluate for
PE.

EXAM:
CT ANGIOGRAPHY CHEST WITH CONTRAST
TECHNIQUE: Multidetector CT imaging of the chest was performed using the
standard protocol during bolus administration of intravenous
contrast. Multiplanar CT image reconstructions and MIPs were
obtained to evaluate the vascular anatomy.
CONTRAST:  71mL LUTG66-SSV IOPAMIDOL (LUTG66-SSV) INJECTION 76%

[Series 5: thins · axial · 0.87mm/px · z∈[-468,-125]mm · 15 of 389 slices shown]
[im 23/389  lung]
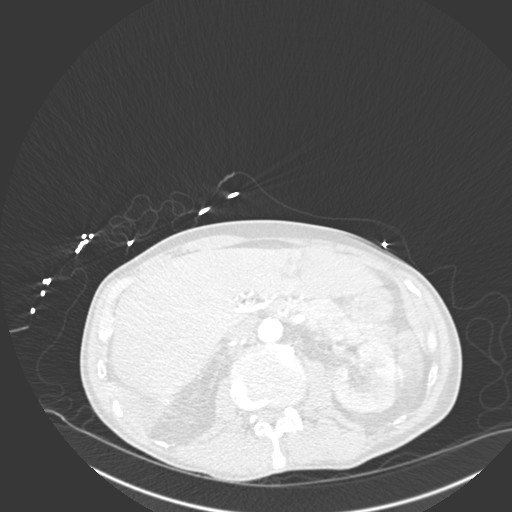
[im 46/389  soft-tissue]
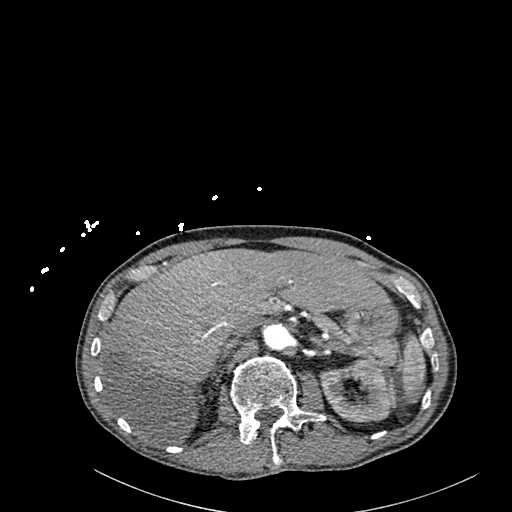
[im 69/389  lung]
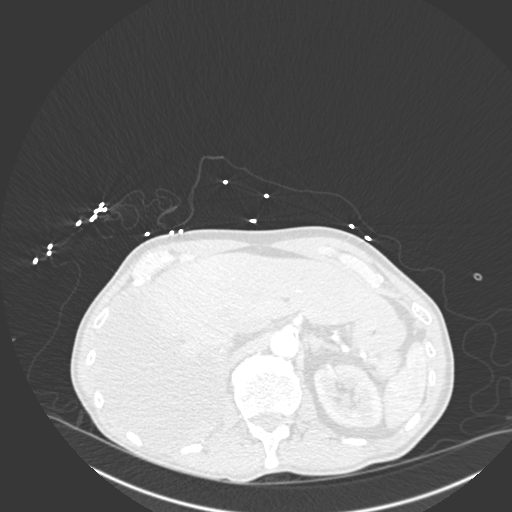
[im 92/389  soft-tissue]
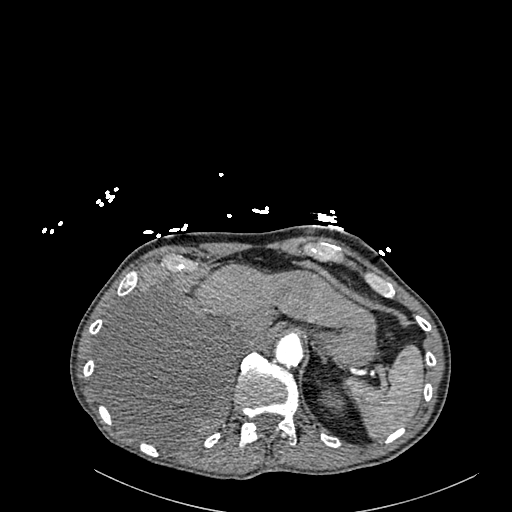
[im 115/389  lung]
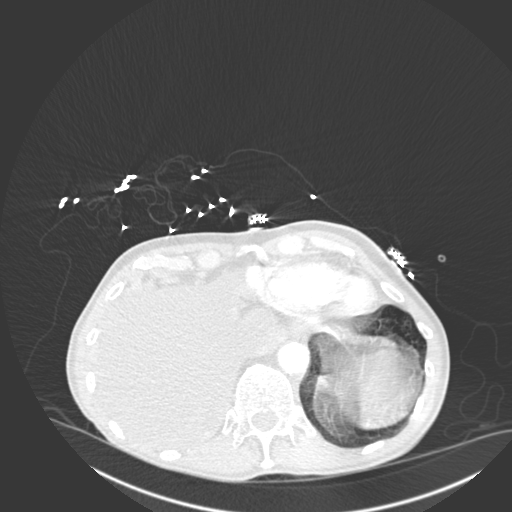
[im 137/389  soft-tissue]
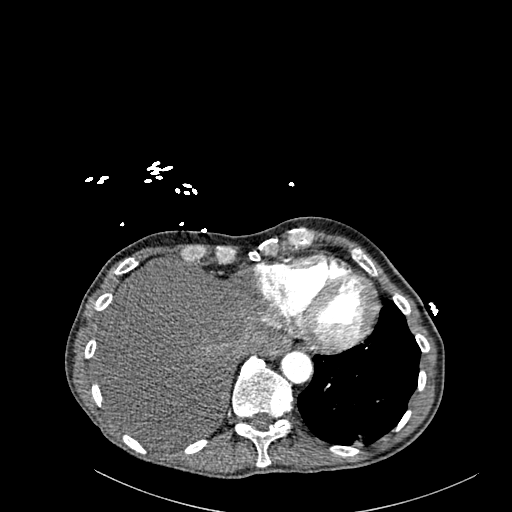
[im 160/389  lung]
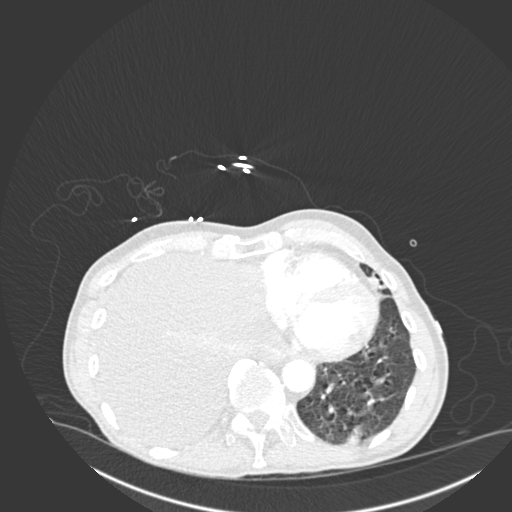
[im 206/389  soft-tissue]
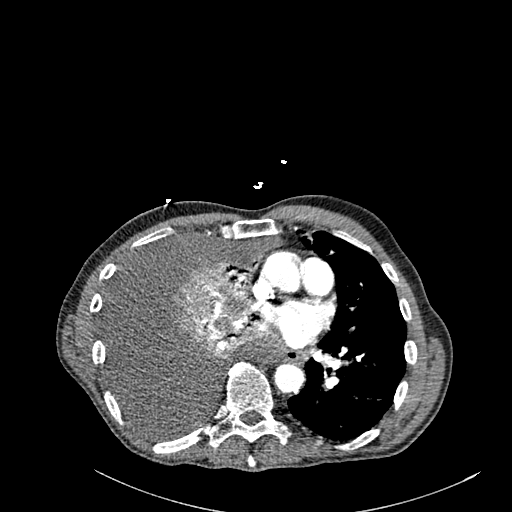
[im 229/389  lung]
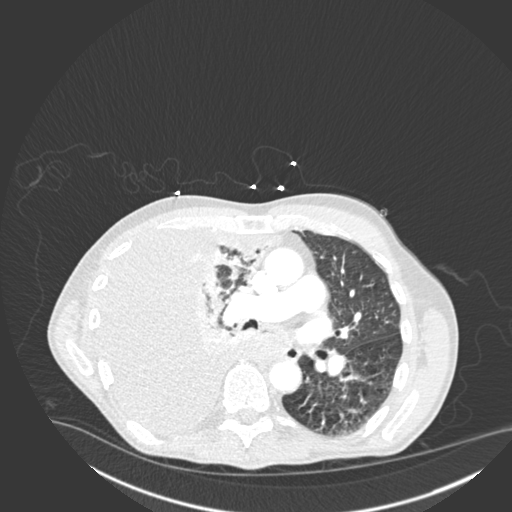
[im 252/389  soft-tissue]
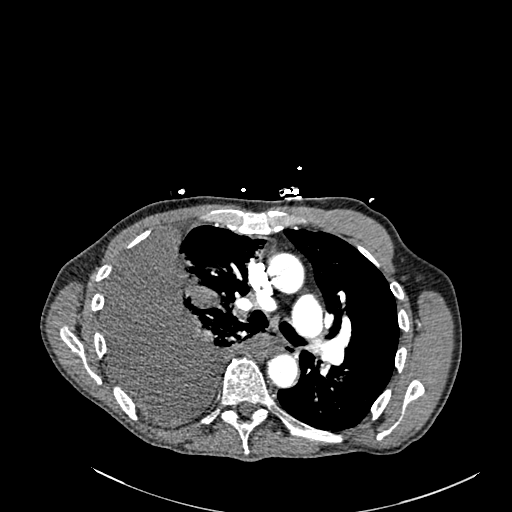
[im 274/389  lung]
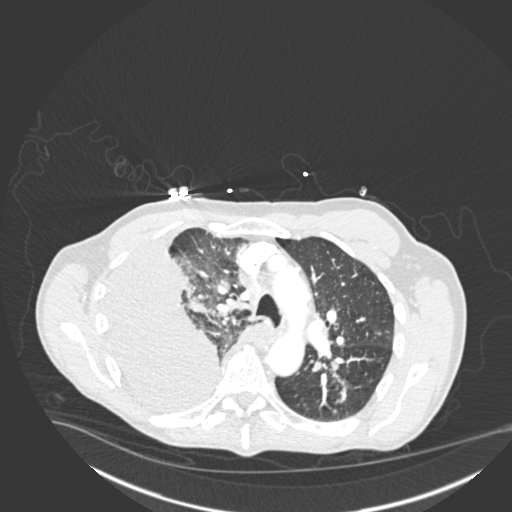
[im 297/389  soft-tissue]
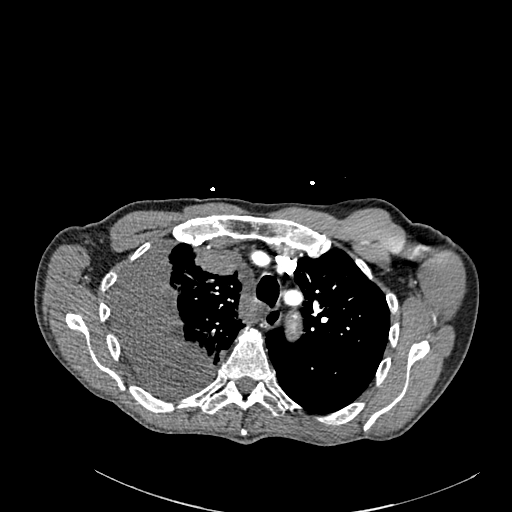
[im 320/389  lung]
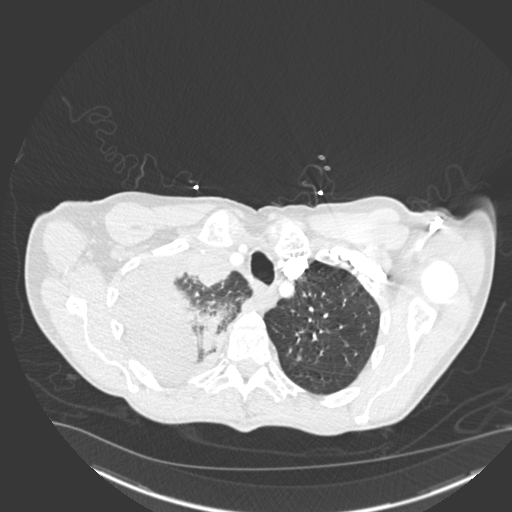
[im 343/389  soft-tissue]
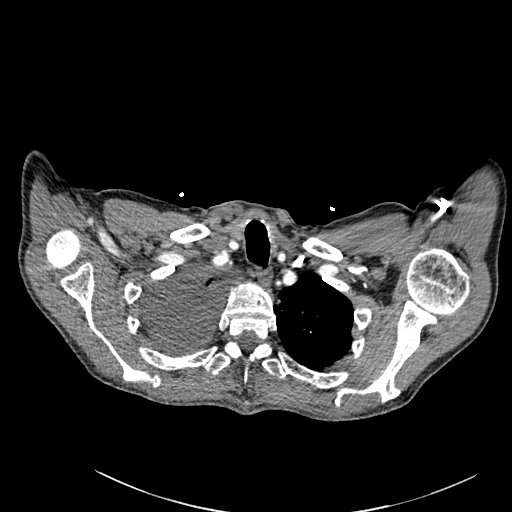
[im 366/389  lung]
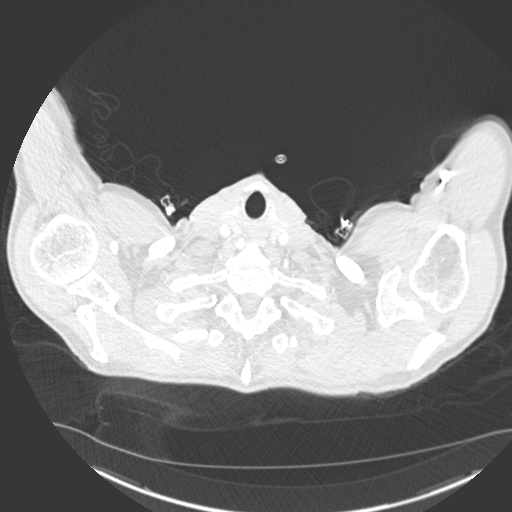

[Series 6: coronal mpr · coronal · 0.92mm/px · 3 of 115 slices shown]
[im 29/115  soft-tissue]
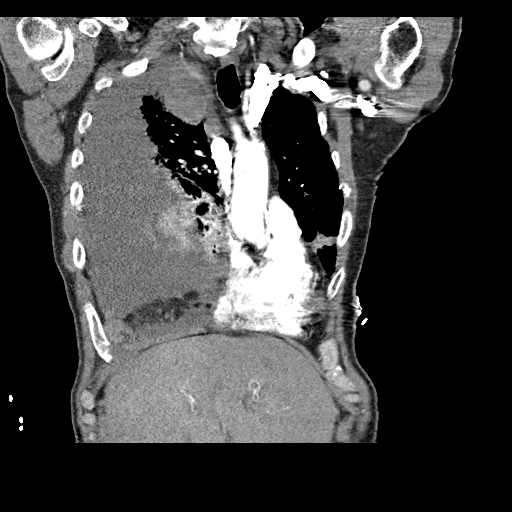
[im 58/115  soft-tissue]
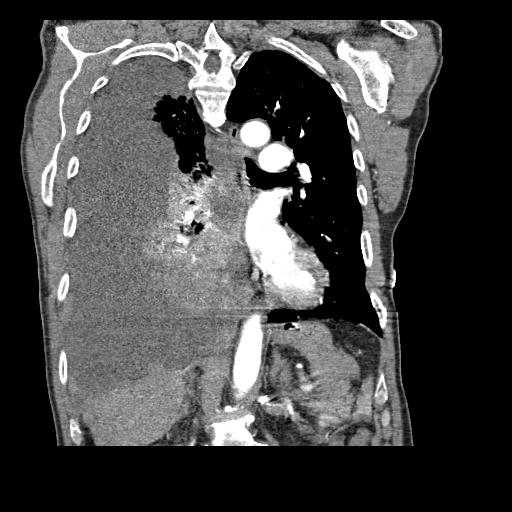
[im 86/115  soft-tissue]
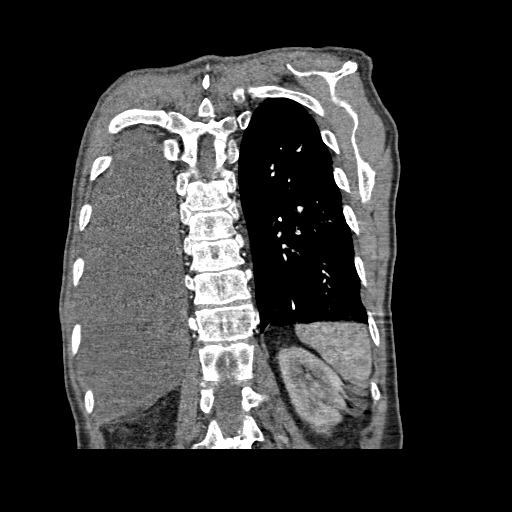

[18 of 46 positions shown; findings below may reference images not displayed]

FINDINGS: Cardiovascular: Satisfactory opacification of the bilateral
pulmonary arteries to the lobar level. Segmental/segmental pulmonary
arteries are limited due to motion degradation. No evidence of
pulmonary embolism.

No evidence of thoracic aortic aneurysm or dissection.
Atherosclerotic calcifications of the aortic arch.

Mild coronary atherosclerosis of the LAD.

The heart is normal in size.  No pericardial effusion.

Mediastinum/Nodes: 12 mm right hilar node (series 4/image 49). 9 mm
subcarinal node (series 4/image 52).

Visualized thyroid is unremarkable.

Lungs/Pleura: Evaluation lung parenchyma is constrained by
respiratory motion.

2.7 x 4.6 cm mass in the anterior right upper lung (series 4/image
29), suspicious for primary bronchogenic neoplasm.

Additional 1.8 x 2.6 cm nodule centrally in the right upper lobe
(series 4/image 47), suspicious. Large right pleural effusion with
pleural-based nodularity/metastases (for example, series 4/image
104).

Associated compressive atelectasis of the right middle and lower
lobes.

Multiple pulmonary nodules in the left upper and lower lobes, lower
lobe predominant, poorly evaluated due to motion degradation. Index
nodule measures 14 mm in the central left lower lobe (series
10/image 28). These are compatible with metastases. Additional mild
patchy opacity in the lingula (series 10/image 98).

No pneumothorax.

Upper Abdomen: Visualized upper abdomen is motion degraded but
grossly unremarkable, noting aberrant perirenal vasculature along
the right upper kidney (series 4/image 127).

Musculoskeletal: Degenerative changes of the visualized
thoracolumbar spine.

Review of the MIP images confirms the above findings.
IMPRESSION: No evidence of pulmonary embolism.

4.6 cm mass in the anterior right upper lung, suspicious for primary
bronchogenic neoplasm versus pleural-based metastasis. Additional
2.6 cm nodule centrally in the right upper lobe, suspicious for
primary bronchogenic neoplasm versus metastasis (related to the
anterior mass).

Large right pleural effusion with pleural-based metastases.

Multiple pulmonary metastases in the left hemithorax.

Possible small subcarinal and right hilar nodal metastases,
indeterminate.

Aortic Atherosclerosis ([id number]).

## 2020-04-08 IMAGING — DX DG CHEST 1V PORT
2 series · 2 of 2 positions shown · non-contrast
Comparison: None.

CLINICAL DATA: Shortness of Breath

EXAM:
PORTABLE CHEST 1 VIEW

[chest ap (1 of 2)]
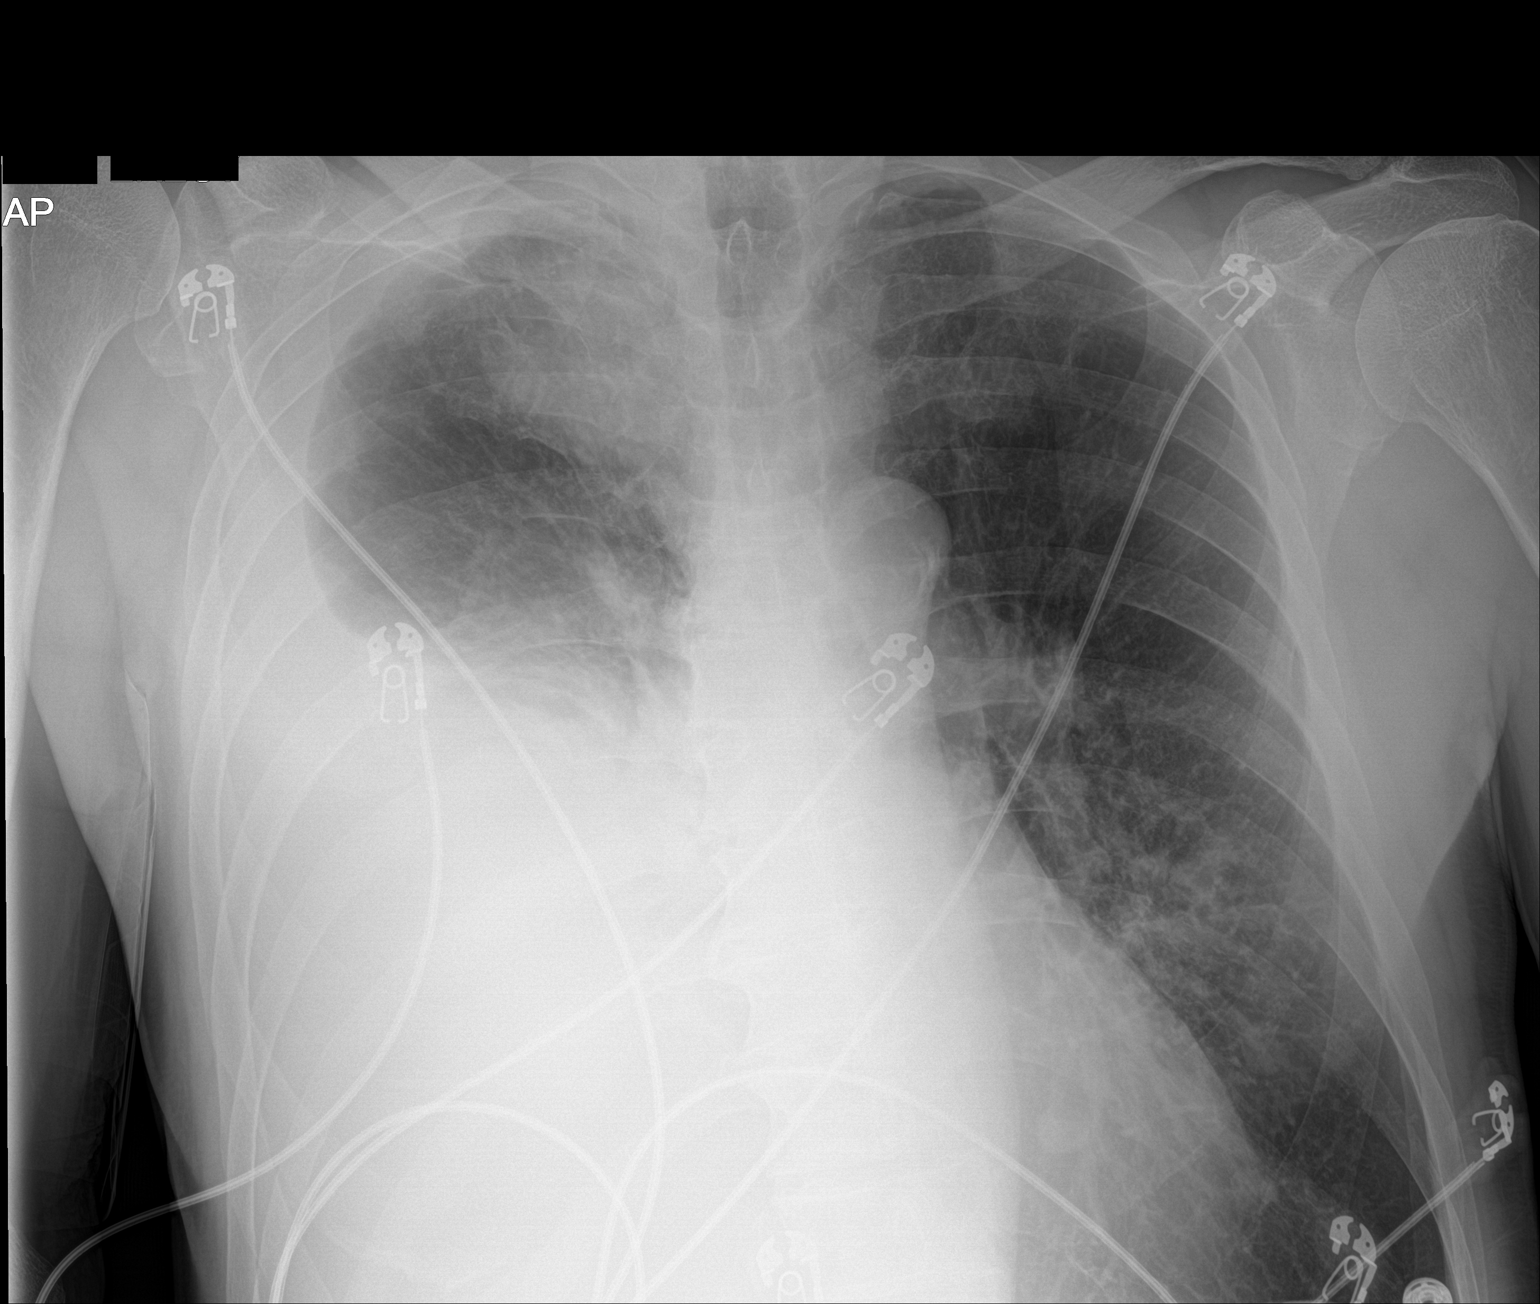

[chest ap (2 of 2)]
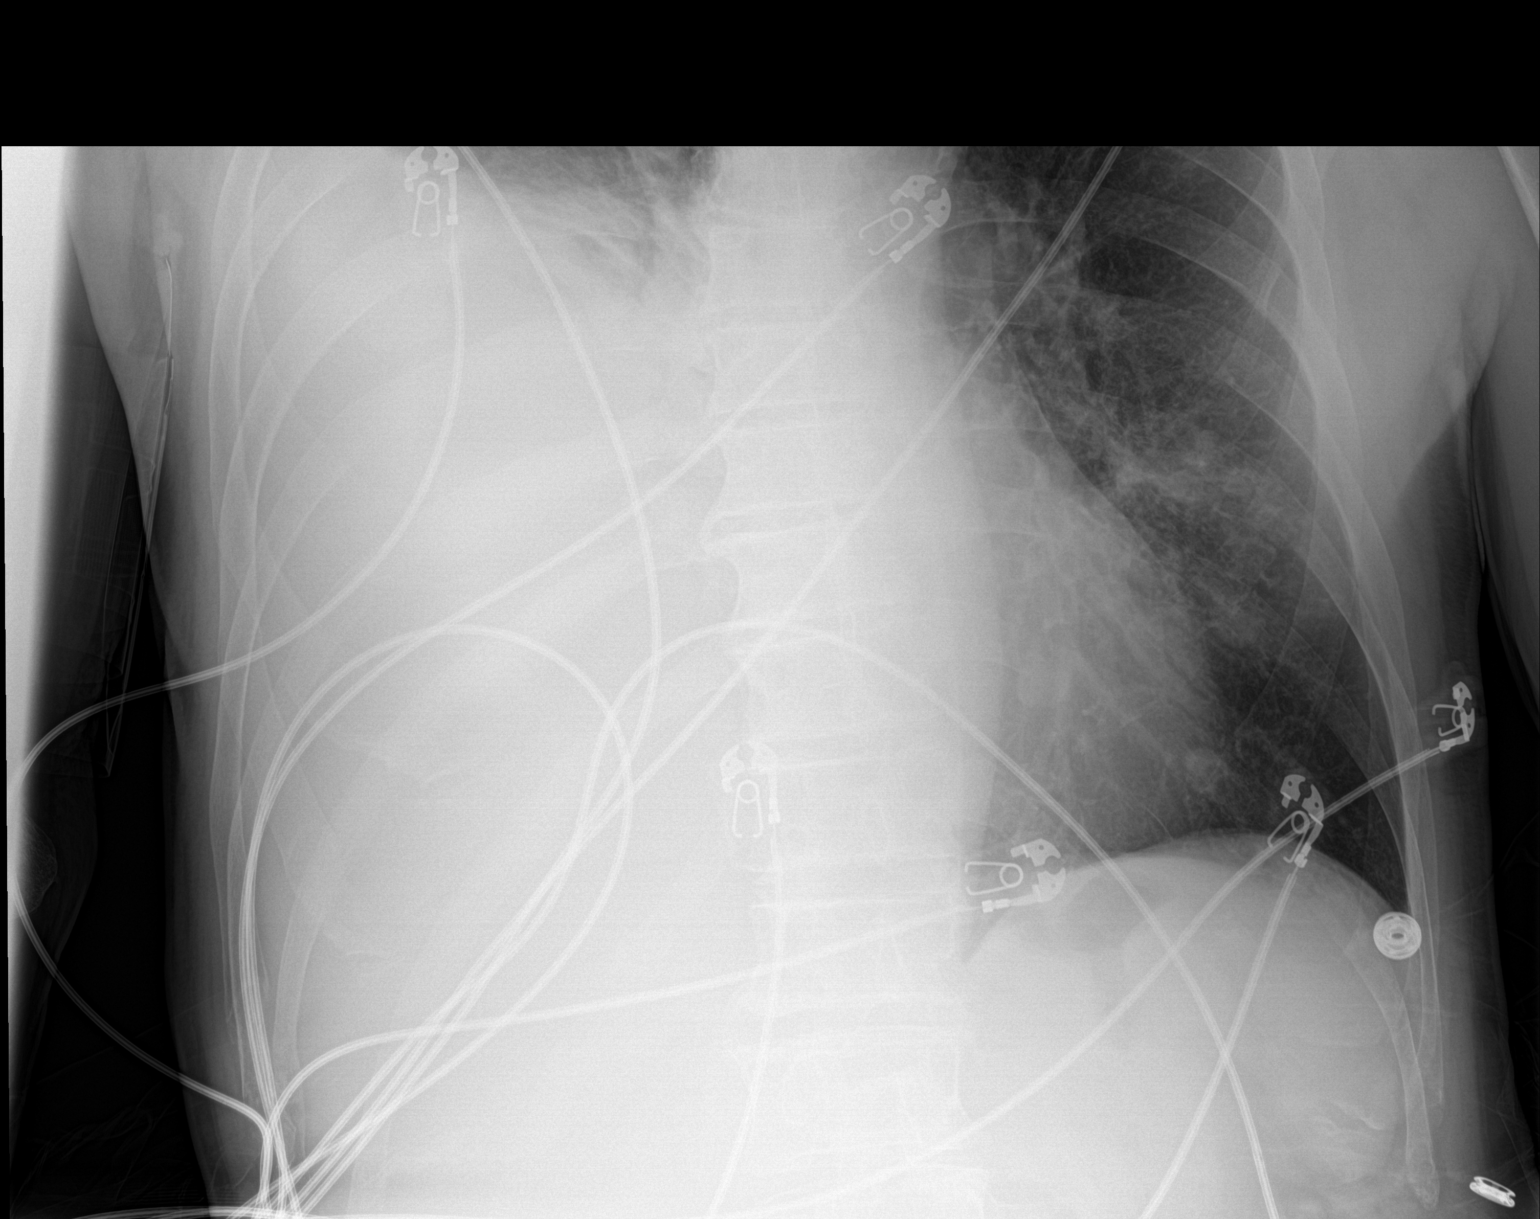

[2 of 2 positions shown; findings below may reference images not displayed]

FINDINGS: Cardiac shadow is stable. Nodular changes are noted in the left lung
base suspicious for metastatic disease. Large right-sided pleural
effusion with underlying atelectasis is noted. Bony structures are
within normal limits.
IMPRESSION: Large right-sided pleural effusion with underlying atelectatic
changes.

Multiple nodular densities are noted over the left base suspicious
for neoplastic involvement. CT is recommended for further
evaluation.

## 2020-04-28 DIAGNOSIS — Z23 Encounter for immunization: Secondary | ICD-10-CM | POA: Diagnosis not present

## 2020-05-02 ENCOUNTER — Other Ambulatory Visit: Payer: Self-pay | Admitting: Oncology

## 2020-05-06 ENCOUNTER — Other Ambulatory Visit: Payer: Medicare Other

## 2020-05-06 ENCOUNTER — Encounter (HOSPITAL_COMMUNITY): Payer: Self-pay

## 2020-05-06 ENCOUNTER — Other Ambulatory Visit: Payer: Self-pay

## 2020-05-06 ENCOUNTER — Ambulatory Visit (HOSPITAL_COMMUNITY)
Admission: RE | Admit: 2020-05-06 | Discharge: 2020-05-06 | Disposition: A | Discharge status: Home / Self Care | Payer: Medicare Other | Source: Ambulatory Visit | Attending: Oncology | Admitting: Oncology

## 2020-05-06 DIAGNOSIS — C3490 Malignant neoplasm of unspecified part of unspecified bronchus or lung: Secondary | ICD-10-CM | POA: Diagnosis not present

## 2020-05-06 DIAGNOSIS — I2510 Atherosclerotic heart disease of native coronary artery without angina pectoris: Secondary | ICD-10-CM | POA: Diagnosis not present

## 2020-05-06 DIAGNOSIS — I700 Atherosclerosis of aorta: Secondary | ICD-10-CM | POA: Diagnosis not present

## 2020-05-06 DIAGNOSIS — J432 Centrilobular emphysema: Secondary | ICD-10-CM | POA: Diagnosis not present

## 2020-05-08 ENCOUNTER — Encounter: Payer: Self-pay | Admitting: Nurse Practitioner

## 2020-05-08 ENCOUNTER — Other Ambulatory Visit: Payer: Self-pay

## 2020-05-08 ENCOUNTER — Inpatient Hospital Stay: Payer: Medicare Other

## 2020-05-08 ENCOUNTER — Inpatient Hospital Stay: Payer: Medicare Other | Attending: Oncology

## 2020-05-08 ENCOUNTER — Inpatient Hospital Stay (HOSPITAL_BASED_OUTPATIENT_CLINIC_OR_DEPARTMENT_OTHER): Payer: Medicare Other | Admitting: Nurse Practitioner

## 2020-05-08 VITALS — BP 143/86 | HR 77 | Temp 97.3°F | Resp 16 | Ht 73.0 in | Wt 149.4 lb

## 2020-05-08 DIAGNOSIS — R5383 Other fatigue: Secondary | ICD-10-CM

## 2020-05-08 DIAGNOSIS — I4891 Unspecified atrial fibrillation: Secondary | ICD-10-CM

## 2020-05-08 DIAGNOSIS — C3490 Malignant neoplasm of unspecified part of unspecified bronchus or lung: Secondary | ICD-10-CM | POA: Insufficient documentation

## 2020-05-08 DIAGNOSIS — C782 Secondary malignant neoplasm of pleura: Secondary | ICD-10-CM | POA: Diagnosis not present

## 2020-05-08 DIAGNOSIS — Z79899 Other long term (current) drug therapy: Secondary | ICD-10-CM | POA: Diagnosis not present

## 2020-05-08 DIAGNOSIS — Z5112 Encounter for antineoplastic immunotherapy: Secondary | ICD-10-CM | POA: Insufficient documentation

## 2020-05-08 DIAGNOSIS — D649 Anemia, unspecified: Secondary | ICD-10-CM | POA: Insufficient documentation

## 2020-05-08 LAB — CMP (CANCER CENTER ONLY)
ALT: 17 U/L (ref 0–44)
AST: 24 U/L (ref 15–41)
Albumin: 3.4 g/dL — ABNORMAL LOW (ref 3.5–5.0)
Alkaline Phosphatase: 129 U/L — ABNORMAL HIGH (ref 38–126)
Anion gap: 6 (ref 5–15)
BUN: 42 mg/dL — ABNORMAL HIGH (ref 8–23)
CO2: 28 mmol/L (ref 22–32)
Calcium: 9.7 mg/dL (ref 8.9–10.3)
Chloride: 103 mmol/L (ref 98–111)
Creatinine: 1.69 mg/dL — ABNORMAL HIGH (ref 0.61–1.24)
GFR, Est AFR Am: 46 mL/min — ABNORMAL LOW (ref >60)
GFR, Estimated: 39 mL/min — ABNORMAL LOW (ref >60)
Glucose, Bld: 81 mg/dL (ref 70–99)
Potassium: 4.6 mmol/L (ref 3.5–5.1)
Sodium: 137 mmol/L (ref 135–145)
Total Bilirubin: 0.4 mg/dL (ref 0.3–1.2)
Total Protein: 8 g/dL (ref 6.5–8.1)

## 2020-05-08 LAB — CBC WITH DIFFERENTIAL (CANCER CENTER ONLY)
Abs Immature Granulocytes: 0.03 10*3/uL (ref 0.00–0.07)
Basophils Absolute: 0 10*3/uL (ref 0.0–0.1)
Basophils Relative: 1 %
Eosinophils Absolute: 0.4 10*3/uL (ref 0.0–0.5)
Eosinophils Relative: 5 %
HCT: 35.1 % — ABNORMAL LOW (ref 39.0–52.0)
Hemoglobin: 11.5 g/dL — ABNORMAL LOW (ref 13.0–17.0)
Immature Granulocytes: 0 %
Lymphocytes Relative: 11 %
Lymphs Abs: 0.9 10*3/uL (ref 0.7–4.0)
MCH: 30.7 pg (ref 26.0–34.0)
MCHC: 32.8 g/dL (ref 30.0–36.0)
MCV: 93.6 fL (ref 80.0–100.0)
Monocytes Absolute: 1 10*3/uL (ref 0.1–1.0)
Monocytes Relative: 11 %
Neutro Abs: 6.3 10*3/uL (ref 1.7–7.7)
Neutrophils Relative %: 72 %
Platelet Count: 243 10*3/uL (ref 150–400)
RBC: 3.75 MIL/uL — ABNORMAL LOW (ref 4.22–5.81)
RDW: 12.9 % (ref 11.5–15.5)
WBC Count: 8.7 10*3/uL (ref 4.0–10.5)
nRBC: 0 % (ref 0.0–0.2)

## 2020-05-08 LAB — TSH: TSH: 2.667 u[IU]/mL (ref 0.320–4.118)

## 2020-05-08 MED ORDER — SODIUM CHLORIDE 0.9 % IV SOLN
400.0000 mg | Freq: Once | INTRAVENOUS | Status: AC
  Administered 2020-05-08: 400 mg via INTRAVENOUS
  Filled 2020-05-08: qty 16

## 2020-05-08 MED ORDER — SODIUM CHLORIDE 0.9 % IV SOLN
Freq: Once | INTRAVENOUS | Status: AC
  Filled 2020-05-08: qty 250

## 2020-05-08 NOTE — Patient Instructions (Signed)
[place] Discharge Instructions for Patients Receiving Chemotherapy  Today you received the following chemotherapy agents Pembrolizumab [facility]).  To help prevent nausea and vomiting after your treatment, we encourage you to take your nausea medication as prescribed.   If you develop nausea and vomiting that is not controlled by your nausea medication, call the clinic.   BELOW ARE SYMPTOMS THAT SHOULD BE REPORTED IMMEDIATELY:  *FEVER GREATER THAN 100.5 F  *CHILLS WITH OR WITHOUT FEVER  NAUSEA AND VOMITING THAT IS NOT CONTROLLED WITH YOUR NAUSEA MEDICATION  *UNUSUAL SHORTNESS OF BREATH  *UNUSUAL BRUISING OR BLEEDING  TENDERNESS IN MOUTH AND THROAT WITH OR WITHOUT PRESENCE OF ULCERS  *URINARY PROBLEMS  *BOWEL PROBLEMS  UNUSUAL RASH Items with * indicate a potential emergency and should be followed up as soon as possible.  Feel free to call the clinic should you have any questions or concerns. The clinic phone number is [phone].  Please show the [place] at check-in to the Emergency Department and triage nurse.

## 2020-05-08 NOTE — Progress Notes (Signed)
[place] OFFICE PROGRESS NOTE   Diagnosis: Non-small cell lung cancer  INTERVAL HISTORY:   [name] returns as scheduled.  He completed another cycle of Pembrolizumab 03/27/2020.  He denies diarrhea and skin rash.  No shortness of breath.  No fever or cough.  He is exercising on a regular basis.  No nausea or vomiting.  He notes a soreness at the right upper inner gumline.  He reports a negative x-ray by his dentist.  He has an appointment with ENT in the near future to evaluate the gum soreness as well as ear symptoms.  Objective:  Vital signs in last 24 hours:  Blood pressure (!) 143/86, pulse 77, temperature (!) 97.3 F (36.3 C), temperature source Tympanic, resp. rate 16, height '6\' 1"'  (1.854 m), weight 149 lb 6.4 oz (67.8 kg), SpO2 98 %.    HEENT: No ulceration or erythema at the right upper inner gumline.  Unable to visualize right tympanic membrane due to cerumen.  Resp: Breath sounds diminished right lower lung field.  No respiratory distress. Cardio: Regular rate and rhythm. GI: No hepatomegaly. Vascular: No leg edema. Neuro: Alert and oriented. Skin: No rash.   Lab Results:  Lab Results  Component Value Date   WBC 8.7 05/08/2020   HGB 11.5 (L) 05/08/2020   HCT 35.1 (L) 05/08/2020   MCV 93.6 05/08/2020   PLT 243 05/08/2020   NEUTROABS 6.3 05/08/2020    Imaging:  CT CHEST WO CONTRAST  Result Date: 05/06/2020 CLINICAL DATA:  Non-small cell lung cancer staging EXAM: CT CHEST WITHOUT CONTRAST TECHNIQUE: Multidetector CT imaging of the chest was performed following the standard protocol without IV contrast. COMPARISON:  01/01/2020, 07/26/2019 FINDINGS: Cardiovascular: Aortic atherosclerosis. Normal heart size. Left coronary artery calcifications. No pericardial effusion. Mediastinum/Nodes: No enlarged mediastinal, hilar, or axillary lymph nodes. Thyroid gland, trachea, and esophagus demonstrate no significant findings. Lungs/Pleura: Unchanged post  treatment appearance of the chest with a chronic, loculated moderate right pleural effusion with associated atelectasis or consolidation and pleural thickening. Stable small pulmonary nodules measuring 3 mm or smaller, for example in the left lower lobe (series 7, image 133). Unchanged bronchiectasis and bronchial plugging of the medial segment right middle lobe and lingula (series 7, image 139). Unchanged biapical pleuroparenchymal scarring. Mild centrilobular emphysema. Upper Abdomen: No acute abnormality. Musculoskeletal: No chest wall mass or suspicious bone lesions identified. IMPRESSION: 1. Unchanged post treatment appearance of the chest with a chronic, loculated moderate right pleural effusion with associated atelectasis or consolidation and pleural thickening. 2. Stable small pulmonary nodules measuring 3 mm or smaller. Attention on follow-up. 3. Emphysema (ICD10-J43.9). 4. Coronary artery disease. Aortic Atherosclerosis (ICD10-I70.0). Electronically Signed   By: Eddie Candle M.D.   On: 05/06/2020 14:08    Medications: I have reviewed the patient's current medications.  Assessment/Plan: 1. Metastatic non-small cell lung cancer  largeright pleural effusion, right lung mass, right pleural-based masses, left lung nodules  Right thoracentesis 09/04/2018-negative cytology  CT biopsy of right lateral pleural mass 09/07/2018-poorly differentiated non-small cell carcinoma, malignant cells positive for cytokeratin AE1/AE3 and cytokeratin 8/18, histology favoring adenocarcinoma, MSS, tumor mutation burden 5.no EGFR, ALK, BRAF alteration. PDL 1 rearrangement  PDL 1 tumor proportion score-100%   Cycle 1 Alimta/carboplatin 09/22/2018, pembrolizumab 09/28/2018  Cycle 2 Alimta/carboplatin/pembrolizumab 10/15/2018  Cycle 3 Alimta/carboplatin/pembrolizumab 11/06/2018  Cycle4Alimta/carboplatin/pembrolizumab 11/28/2018  CT chest 12/14/2018-response to therapy of lung primary/primaries, thoracic nodal and  pulmonary metastasis. Decrease in right pleural effusion with improvement in pleural-based metastasis. New T10 heterogeneous sclerotic  density likely due to healing of previously CT occult metastasis.  Pembrolizumab 12/19/2018  Pembrolizumab/Alimta starting 01/09/2019  Pembrolizumab/Alimta 01/31/2019  Pembrolizumab/Alimta 02/21/2019  Pembrolizumab/Alimta 03/14/2019  CT 04/01/2019-no evidence of disease progression, small loculated right pleural effusion and right basilar pleural nodularity-stable, stable left lower lobe nodule, persistent sclerosis at T10  Pembrolizumab/Alimta 04/05/2019  Pembrolizumab 05/07/2019  Pembrolizumab 05/28/2019  Pembrolizumab 06/18/2019  Pembrolizumab 07/09/2019  Chest CT 07/26/2019-moderate to large stable exudative right effusion with considerable pleural thickening. Nodularity at the right lung base along pleural thickening similar to prior. Stable left paramediastinal groundglass density nodule. Stable sclerotic lesion posteriorly at the T10 vertebral level.  Pembrolizumab 07/30/2019  Pembrolizumab 08/21/2019-changed to every 6-week dosing  Pembrolizumab 10/11/2019  Pembrolizumab 11/22/2019  CT chest 01/03/2020-chronic appearing loculated effusion right chest with signs of pleural thickening smaller than on prior exam; similar appearance of tree-in-bud bud nodularity in the right upper lobe and left lung apex along with bronchiectatic changes in the lingula and middle lobe; similar appearance of sclerosis T10 vertebral body  Pembrolizumab 01/03/2020  Pembrolizumab 02/14/2020  Pembrolizumab 03/27/2020  Chest CT 05/06/2020-unchanged posttreatment appearance of the chest with a chronic, loculated moderate right pleural effusion with associated atelectasis or consolidation and pleural thickening.  Stable small pulmonary nodules measuring 3 mm or smaller.   Pembrolizumab 05/08/2020   2.Severe anemia-likely secondary to bleeding in the right pleural space  and metastatic carcinoma, red cell transfusions 09/09/2018 09/10/2018, 09/23/2018-improved;progressive 04/26/2019, red cell transfusion.  Improved. 3.Dyspnea secondary to the large right pleural effusion and anemia  Right Pleurx catheter placed 09/11/2018  Chest x-ray 10/09/2018-large right pleural effusion, loculated  Chest x-ray 11/06/2018-no interval change in the right-sided pleural effusion  Pleurx drainage catheter removed 11/13/2018 4.History of tobacco use 5.History of kidney stones 6. Fever-most likely tumor fever, improved 7. Admission 10/08/2018 with rapid atrial fibrillation/flutter-improved with diltiazem, started on Eliquis anticoagulation. Eliquis discontinued. Now on aspirin. 8.Renal insufficiency    Disposition: Brian Ramirez appears well.  Recent chest CT shows no evidence of disease progression.  Plan to continue pembrolizumab every 6 weeks.  We reviewed the CBC and chemistry panel from today.  Labs adequate to proceed with treatment.  Creatinine is stable.  He will return for lab, follow-up, pembrolizumab in 6 weeks.  He will contact the office in the interim with any problems.  Plan reviewed with Dr. [name].    Burdette Forehand ANP/GNP-BC   05/08/2020  1:38 PM

## 2020-05-11 ENCOUNTER — Other Ambulatory Visit: Payer: Self-pay

## 2020-05-11 ENCOUNTER — Ambulatory Visit (INDEPENDENT_AMBULATORY_CARE_PROVIDER_SITE_OTHER): Payer: Medicare Other | Admitting: Otolaryngology

## 2020-05-11 ENCOUNTER — Encounter (INDEPENDENT_AMBULATORY_CARE_PROVIDER_SITE_OTHER): Payer: Self-pay | Admitting: Otolaryngology

## 2020-05-11 ENCOUNTER — Telehealth: Payer: Self-pay | Admitting: Nurse Practitioner

## 2020-05-11 VITALS — Temp 97.9°F

## 2020-05-11 DIAGNOSIS — H6123 Impacted cerumen, bilateral: Secondary | ICD-10-CM | POA: Diagnosis not present

## 2020-05-11 DIAGNOSIS — J310 Chronic rhinitis: Secondary | ICD-10-CM | POA: Diagnosis not present

## 2020-05-11 NOTE — Telephone Encounter (Signed)
Scheduled appointments per 9/24 los. Spoke to patient who is aware of appointments dates and times.

## 2020-05-11 NOTE — Progress Notes (Signed)
HPI: Brian Ramirez is a 74 y.o. male who presents for evaluation of ear discomfort.  He has been taking Keytruda infusions every 6 weeks for treatment of lung cancer.  He has almost completed this and is doing well.  At one point he was having some drainage from his ears and some ear discomfort although this is doing much better presently he used Q-tips.  He has had no trouble with his hearing and the drainage has stopped. He also inquires about a small sore discomfort on the right side of his right nostril.  He saw his dentist and had a Panorex done of his implants that looked good.  He has had no drainage from his nose but does complain of some postnasal drainage for which he takes Mucinex.  Past Medical History:  Diagnosis Date  . Acute congestive heart failure ([place])   . Anemia of chronic disease 10/08/2018  . Anticoagulated   . Atrial flutter with rapid ventricular response ([place]) 10/08/2018  . Difficult airway for intubation 09/26/2016  . Edema of both lower extremities 10/09/2018  . Goals of care, counseling/discussion 09/20/2018  . Hernia 2012  . History of hiatal hernia   . History of kidney stones   . Left inguinal hernia s/p lap repair 11/22/2016 09/26/2016  . Lung cancer ([place]) 09/20/2018  . Lung mass 09/04/2018  . Malnutrition of moderate degree 09/20/2018  . Mass of right lung 09/04/2018  . New onset atrial fibrillation ([place])   . Personal history of kidney stones 1992  . Pleural effusion 09/04/2018  . Pleural effusion on right   . Pressure injury of skin 09/20/2018  . SOB (shortness of breath) 09/19/2018   Past Surgical History:  Procedure Laterality Date  . HERNIA REPAIR  2012   inguinal  . INGUINAL HERNIA REPAIR Left 11/22/2016   Procedure: LAPAROSCOPIC  REPAIR OF LEFT INGUINAL HERNIA WITH MESH;  Surgeon: [name], MD;  Location: [facility] ORS;  Service: General;  Laterality: Left;  . IR INSTILL VIA CHEST TUBE AGENT FOR FIBRINOLYSIS INI DAY  10/10/2018  . IR INSTILL VIA CHEST TUBE AGENT  FOR FIBRINOLYSIS INI DAY  10/31/2018  . IR PERC PLEURAL DRAIN W/INDWELL CATH W/IMG GUIDE  09/11/2018  . IR REMOVAL OF PLURAL CATH W/CUFF  11/13/2018   Social History   Socioeconomic History  . Marital status: Single    Spouse name: Not on file  . Number of children: Not on file  . Years of education: Not on file  . Highest education level: Not on file  Occupational History  . Not on file  Tobacco Use  . Smoking status: Never Smoker  . Smokeless tobacco: Never Used  Substance and Sexual Activity  . Alcohol use: Yes    Alcohol/week: 1.0 standard drink    Types: 1 Glasses of wine per week  . Drug use: No  . Sexual activity: Not on file  Other Topics Concern  . Not on file  Social History Narrative  . Not on file   Social Determinants of Health   Financial Resource Strain:   . Difficulty of Paying Living Expenses: Not on file  Food Insecurity:   . Worried About Charity fundraiser in the Last Year: Not on file  . Ran Out of Food in the Last Year: Not on file  Transportation Needs:   . Lack of Transportation (Medical): Not on file  . Lack of Transportation (Non-Medical): Not on file  Physical Activity:   . Days of Exercise  per Week: Not on file  . Minutes of Exercise per Session: Not on file  Stress:   . Feeling of Stress : Not on file  Social Connections:   . Frequency of Communication with Friends and Family: Not on file  . Frequency of Social Gatherings with Friends and Family: Not on file  . Attends Religious Services: Not on file  . Active Member of Clubs or Organizations: Not on file  . Attends Archivist Meetings: Not on file  . Marital Status: Not on file   Family History  Problem Relation Age of Onset  . Heart disease Father   . Cancer Brother    No Known Allergies Prior to Admission medications   Medication Sig Start Date End Date Taking? Authorizing Provider  Artificial Tear Ointment (DRY EYES OP) Apply 1 drop to eye as needed. Dry, itching eyes     [provider]  aspirin EC 81 MG tablet Take 1 tablet (81 mg total) by mouth daily. 12/03/18   Dorothy Spark, MD  clotrimazole-betamethasone Donalynn Furlong) cream  12/27/19   [provider]  diltiazem (CARDIZEM CD) 120 MG 24 hr capsule Take 1 capsule (120 mg total) by mouth daily. 06/14/19   Dorothy Spark, MD  diphenhydrAMINE HCl (BENADRYL ALLERGY PO) Take by mouth as needed.     [provider]  fexofenadine (ALLEGRA) 180 MG tablet Take 180 mg by mouth as needed for allergies or rhinitis.     [provider]  guaiFENesin (MUCINEX) 600 MG 12 hr tablet Take 600 mg by mouth once.    [provider]  hydrOXYzine (ATARAX/VISTARIL) 25 MG tablet Take 1 tablet (25 mg total) by mouth every 8 (eight) hours as needed for itching. 11/22/19   Ladell Pier, MD  prochlorperazine (COMPAZINE) 10 MG tablet Take 1 tablet (10 mg total) by mouth every 6 (six) hours as needed. 03/14/19   Ladell Pier, MD     Positive ROS: Otherwise negative  All other systems have been reviewed and were otherwise negative with the exception of those mentioned in the HPI and as above.  Physical Exam: Constitutional: Alert, well-appearing, no acute distress Ears: External ears without lesions or tenderness.  He has a small amount of wax adjacent to both TMs that was cleaned with suction.  Otherwise the TMs are intact and clear with good mobility on pneumatic otoscopy.  Both middle ears spaces are clear.  External ear canals are clear bilaterally with no signs of infection or drainage. Nasal: External nose without lesions. Septum mild deformity with mild rhinitis..  Both middle meatus regions are clear with no signs of infection. Oral: Lips and gums without lesions. Tongue and palate mucosa without lesions. Posterior oropharynx clear. Neck: No palpable adenopathy or masses Respiratory: Breathing comfortably  Skin: No facial/neck lesions or rash  noted.  Procedures  Assessment: Clear ear canals and TMs on exam in the office today.  He had minimal wax adjacent to both TMs from use of Q-tips and this was cleaned in the office.  No signs of external otitis. Chronic rhinitis  Plan: Recommended use of saline irrigations for the nasal drainage and/or use of Mucinex. Reassured him of no signs of ear canal or TM infections. Cautioned him about not using Q-tips in the ears.  Radene Journey, MD

## 2020-05-20 ENCOUNTER — Ambulatory Visit (INDEPENDENT_AMBULATORY_CARE_PROVIDER_SITE_OTHER): Payer: Medicare Other | Admitting: Cardiology

## 2020-05-20 ENCOUNTER — Other Ambulatory Visit: Payer: Self-pay

## 2020-05-20 VITALS — BP 146/64 | HR 57 | Ht 73.0 in | Wt 151.0 lb

## 2020-05-20 DIAGNOSIS — I10 Essential (primary) hypertension: Secondary | ICD-10-CM | POA: Diagnosis not present

## 2020-05-20 DIAGNOSIS — I5043 Acute on chronic combined systolic (congestive) and diastolic (congestive) heart failure: Secondary | ICD-10-CM | POA: Diagnosis not present

## 2020-05-20 DIAGNOSIS — I480 Paroxysmal atrial fibrillation: Secondary | ICD-10-CM | POA: Diagnosis not present

## 2020-05-20 NOTE — Patient Instructions (Signed)
Medication Instructions:   Your physician recommends that you continue on your current medications as directed. Please refer to the Current Medication list given to you today.  *If you need a refill on your cardiac medications before your next appointment, please call your pharmacy*   Follow-Up: At [facility], you and your health needs are our priority.  As part of our continuing mission to provide you with exceptional heart care, we have created designated Provider Care Teams.  These Care Teams include your primary Cardiologist (physician) and Advanced Practice Providers (APPs -  Physician Assistants and Nurse Practitioners) who all work together to provide you with the care you need, when you need it.  We recommend signing up for the patient portal called "MyChart".  Sign up information is provided on this After Visit Summary.  MyChart is used to connect with patients for Virtual Visits (Telemedicine).  Patients are able to view lab/test results, encounter notes, upcoming appointments, etc.  Non-urgent messages can be sent to your provider as well.   To learn more about what you can do with MyChart, go to [email or web address].    Your next appointment:   1 year(s)  The format for your next appointment:   In Person  Provider:   [name], MD

## 2020-05-20 NOTE — Progress Notes (Signed)
Cardiology Office Note:    Date:  05/21/2020   ID:  Brian Ramirez, DOB 28-Nov-1945, MRN [id number]  PCP:  [name], MD  Cardiologist:  [name], MD  Electrophysiologist:  None   Referring MD: [name], MD   Reason for visit: 6 months follow up  History of Present Illness:    Brian Ramirez is a 74 y.o. male with a hx of of metastatic non-small cell lung CA, right pleural effusion status post Pleurex catheter, anemia was found to have new onset atrial flutter with RVR during blood transfusion for anemia 10/11/2018.  2D echo normal LVEF 55% left atrium was moderately dilated.  Patient was placed on diltiazem and amiodarone initially with plans not to use long-term because of lung cancer. ChadsVasc equals 1 but with his cancer he may be at increased risk of stroke.  03/07/2019 - 3 months follow up, the patient continues to be treated for metastatic non-small cell lung cancer with largeright pleural effusion, right lung mass, right pleural-based masses, left lung nodules. Chest CT 12/14/2018 showed response to therapy of lung primary/primaries, thoracic nodal and pulmonary metastasis. Decrease in right pleural effusion with improvement in pleural-based metastasis. New T10 heterogeneous sclerotic density likely due to healing of previously CT occult metastasis. He is on therapy with Pembrolizumab. He has severe anemia-likely secondary to bleeding in the right pleural space and metastatic carcinoma, red cell transfusions 09/09/2018 09/10/2018, 09/23/2018-improved, most recent Hb 9.4 on 02/21/2019. He is having on and off lower extremity edema only in his left leg that is chronic and goes away by the morning.  He feels occasional palpitations with regards to extra beats.  No longer palpitations no dizziness or syncope.  No chest pain.  06/14/2019 -the patient develop acute kidney failure believed to be secondary to chemotherapy that was discontinued, his creatinine went up from 0.9-2.0  now improved to 1.7, he states that his lower extremity edema has resolved, he has no orthopnea or proximal nocturnal dyspnea.  He denies any chest pain and has stable dyspnea on exertion he continues to use 24/7 home oxygen.  In the last 3 months he only had one episode of palpitations in August that started at night his pulse ox showed a heart rate of 150 he went to bed and woke up with a heart rate of 70.  No other episodes since then.  No bleeding.  10/17/2019 - 6 months follow up, he is feeling okay, he continues therapy for his lung cancer, currently on pembrolizumab every 6 weeks with minimal side effects, last dose on 08/21/2019.  He overall has been losing weight and has lost over 20 pounds in the last 2 years, he is trying to exercise as much as he can to keep his muscles strong.  He has good appetite.  He denies any chest pain, no palpitations.  05/20/2020 - the patient is feeling and looking great, he states that his most recent chest CTA on 05/06/2020 showed stable unchanged post treatment appearance of the chest, he has only two rounds of chemo left. He is able to walk, denies any palpitations, dizziness, falls, no chest pain, DOE, no LE edema.  Past Medical History:  Diagnosis Date  . Acute congestive heart failure ([place])   . Anemia of chronic disease 10/08/2018  . Anticoagulated   . Atrial flutter with rapid ventricular response ([place]) 10/08/2018  . Difficult airway for intubation 09/26/2016  . Edema of both lower extremities 10/09/2018  . Goals of care, counseling/discussion 09/20/2018  .  Hernia 2012  . History of hiatal hernia   . History of kidney stones   . Left inguinal hernia s/p lap repair 11/22/2016 09/26/2016  . Lung cancer ([place]) 09/20/2018  . Lung mass 09/04/2018  . Malnutrition of moderate degree 09/20/2018  . Mass of right lung 09/04/2018  . New onset atrial fibrillation ([place])   . Personal history of kidney stones 1992  . Pleural effusion 09/04/2018  . Pleural effusion on right   .  Pressure injury of skin 09/20/2018  . SOB (shortness of breath) 09/19/2018    Past Surgical History:  Procedure Laterality Date  . HERNIA REPAIR  2012   inguinal  . INGUINAL HERNIA REPAIR Left 11/22/2016   Procedure: LAPAROSCOPIC  REPAIR OF LEFT INGUINAL HERNIA WITH MESH;  Surgeon: [name], MD;  Location: [facility];  Service: General;  Laterality: Left;  . IR INSTILL VIA CHEST TUBE AGENT FOR FIBRINOLYSIS INI DAY  10/10/2018  . IR INSTILL VIA CHEST TUBE AGENT FOR FIBRINOLYSIS INI DAY  10/31/2018  . IR PERC PLEURAL DRAIN W/INDWELL CATH W/IMG GUIDE  09/11/2018  . IR REMOVAL OF PLURAL CATH W/CUFF  11/13/2018    Current Medications: Current Meds  Medication Sig  . Artificial Tear Ointment (DRY EYES OP) Apply 1 drop to eye as needed. Dry, itching eyes  . aspirin EC 81 MG tablet Take 1 tablet (81 mg total) by mouth daily.  Marland Kitchen diltiazem (CARDIZEM CD) 120 MG 24 hr capsule Take 1 capsule (120 mg total) by mouth daily.  . diphenhydrAMINE HCl (BENADRYL ALLERGY PO) Take by mouth as needed.   . fexofenadine (ALLEGRA) 180 MG tablet Take 180 mg by mouth as needed for allergies or rhinitis.   Marland Kitchen guaiFENesin (MUCINEX) 600 MG 12 hr tablet Take 600 mg by mouth once.  . hydrOXYzine (ATARAX/VISTARIL) 25 MG tablet Take 1 tablet (25 mg total) by mouth every 8 (eight) hours as needed for itching.  . prochlorperazine (COMPAZINE) 10 MG tablet Take 1 tablet (10 mg total) by mouth every 6 (six) hours as needed.     Allergies:   Patient has no known allergies.   Social History   Socioeconomic History  . Marital status: Single    Spouse name: Not on file  . Number of children: Not on file  . Years of education: Not on file  . Highest education level: Not on file  Occupational History  . Not on file  Tobacco Use  . Smoking status: Never Smoker  . Smokeless tobacco: Never Used  Substance and Sexual Activity  . Alcohol use: Yes    Alcohol/week: 1.0 standard drink    Types: 1 Glasses of wine per week  . Drug  use: No  . Sexual activity: Not on file  Other Topics Concern  . Not on file  Social History Narrative  . Not on file   Social Determinants of Health   Financial Resource Strain:   . Difficulty of Paying Living Expenses: Not on file  Food Insecurity:   . Worried About Charity fundraiser in the Last Year: Not on file  . Ran Out of Food in the Last Year: Not on file  Transportation Needs:   . Lack of Transportation (Medical): Not on file  . Lack of Transportation (Non-Medical): Not on file  Physical Activity:   . Days of Exercise per Week: Not on file  . Minutes of Exercise per Session: Not on file  Stress:   . Feeling of Stress : Not on  file  Social Connections:   . Frequency of Communication with Friends and Family: Not on file  . Frequency of Social Gatherings with Friends and Family: Not on file  . Attends Religious Services: Not on file  . Active Member of Clubs or Organizations: Not on file  . Attends Archivist Meetings: Not on file  . Marital Status: Not on file     Family History: The patient's family history includes Cancer in his brother; Heart disease in his father.  ROS:   Please see the history of present illness.    All other systems reviewed and are negative.  EKGs/Labs/Other Studies Reviewed:    The following studies were reviewed today:  TTE 10/09/2018  Echo 10/09/2018 1. The left ventricle has a visually estimated ejection fraction of of 55%. The cavity size was normal. Left ventricular diastolic parameters were normal No evidence of left ventricular regional wall motion abnormalities. 2. The right ventricle has normal systolic function. The cavity was normal. There is no increase in right ventricular wall thickness. 3. Left atrial size was moderately dilated. 4. The tricuspid valve is normal in structure. 5. The aortic valve is tricuspid Mild calcification of the aortic valve. no stenosis of the aortic valve. 6. The pulmonic valve was  normal in structure. 7. The aortic root and ascending aorta are normal in size and structure. 8. The mitral valve is normal in structure. No evidence of mitral valve stenosis. Trivial mitral regurgitation. 9. Normal IVC size. PA systolic pressure 37 mmHg.  EKG:  EKG is ordered today.  The ekg ordered today demonstrates sinus rhythm, PACs, unchanged from prior.  This was personally reviewed.  Recent Labs: 05/08/2020: ALT 17; BUN 42; Creatinine 1.69; Hemoglobin 11.5; Platelet Count 243; Potassium 4.6; Sodium 137; TSH 2.667  Recent Lipid Panel No results found for: CHOL, TRIG, HDL, CHOLHDL, VLDL, LDLCALC, LDLDIRECT  Physical Exam:    VS:  BP (!) 146/64   Pulse (!) 57   Ht 6\' 1"  (1.854 m)   Wt 151 lb (68.5 kg)   SpO2 96%   BMI 19.92 kg/m     Wt Readings from Last 3 Encounters:  05/20/20 151 lb (68.5 kg)  05/08/20 149 lb 6.4 oz (67.8 kg)  03/27/20 146 lb 8 oz (66.5 kg)    GEN: Cachectic, well developed in no acute distress HEENT: Normal NECK: No JVD; No carotid bruits LYMPHATICS: No lymphadenopathy CARDIAC: RRR, no murmurs, rubs, gallops RESPIRATORY:  Clear to auscultation without rales, wheezing or rhonchi  ABDOMEN: Soft, non-tender, non-distended MUSCULOSKELETAL:  No edema; No deformity  SKIN: Warm and dry NEUROLOGIC:  Alert and oriented x 3 PSYCHIATRIC:  Normal affect   ASSESSMENT:    1. Paroxysmal atrial fibrillation (HCC)   2. Acute on chronic combined systolic and diastolic CHF (congestive heart failure) ([place])   3. Hypertension, unspecified type    PLAN:    In order of problems listed above:  PAF - in the setting of anemia during blood transfusion treated with amiodarone and diltiazem. Remains in SR off amiodarone, no recurrent episodes, on ASA only.  Chronic diastolic CHF -compensated, NYHA I.  Anemia -improved, most recent hemoglobin 11.5 in 04/2020  Metastatic non-small cell lung CA followed by oncology, plan as described in HPI. In  remission.  Medication Adjustments/Labs and Tests Ordered: Current medicines are reviewed at length with the patient today.  Concerns regarding medicines are outlined above.  Orders Placed This Encounter  Procedures  . EKG 12-Lead   No orders  of the defined types were placed in this encounter.   Patient Instructions  Medication Instructions:   Your physician recommends that you continue on your current medications as directed. Please refer to the Current Medication list given to you today.  *If you need a refill on your cardiac medications before your next appointment, please call your pharmacy*   Follow-Up: At [facility], you and your health needs are our priority.  As part of our continuing mission to provide you with exceptional heart care, we have created designated Provider Care Teams.  These Care Teams include your primary Cardiologist (physician) and Advanced Practice Providers (APPs -  Physician Assistants and Nurse Practitioners) who all work together to provide you with the care you need, when you need it.  We recommend signing up for the patient portal called "MyChart".  Sign up information is provided on this After Visit Summary.  MyChart is used to connect with patients for Virtual Visits (Telemedicine).  Patients are able to view lab/test results, encounter notes, upcoming appointments, etc.  Non-urgent messages can be sent to your provider as well.   To learn more about what you can do with MyChart, go to [email or web address].    Your next appointment:   1 year(s)  The format for your next appointment:   In Person  Provider:   [name], MD        Signed, [name], MD  05/21/2020 5:23 PM    [place]

## 2020-06-12 ENCOUNTER — Other Ambulatory Visit: Payer: Self-pay | Admitting: Oncology

## 2020-06-19 ENCOUNTER — Inpatient Hospital Stay: Payer: Medicare Other | Attending: Oncology

## 2020-06-19 ENCOUNTER — Inpatient Hospital Stay: Payer: Medicare Other

## 2020-06-19 ENCOUNTER — Inpatient Hospital Stay (HOSPITAL_BASED_OUTPATIENT_CLINIC_OR_DEPARTMENT_OTHER): Payer: Medicare Other | Admitting: Oncology

## 2020-06-19 ENCOUNTER — Other Ambulatory Visit: Payer: Self-pay

## 2020-06-19 VITALS — BP 135/72 | HR 72 | Temp 97.1°F | Resp 18

## 2020-06-19 VITALS — BP 138/81 | HR 78 | Temp 97.2°F | Resp 18 | Ht 73.0 in | Wt 154.7 lb

## 2020-06-19 DIAGNOSIS — Z79899 Other long term (current) drug therapy: Secondary | ICD-10-CM | POA: Insufficient documentation

## 2020-06-19 DIAGNOSIS — Z23 Encounter for immunization: Secondary | ICD-10-CM | POA: Insufficient documentation

## 2020-06-19 DIAGNOSIS — C3490 Malignant neoplasm of unspecified part of unspecified bronchus or lung: Secondary | ICD-10-CM | POA: Diagnosis not present

## 2020-06-19 DIAGNOSIS — C3491 Malignant neoplasm of unspecified part of right bronchus or lung: Secondary | ICD-10-CM | POA: Diagnosis not present

## 2020-06-19 DIAGNOSIS — C782 Secondary malignant neoplasm of pleura: Secondary | ICD-10-CM | POA: Diagnosis not present

## 2020-06-19 DIAGNOSIS — Z5112 Encounter for antineoplastic immunotherapy: Secondary | ICD-10-CM | POA: Insufficient documentation

## 2020-06-19 DIAGNOSIS — R5383 Other fatigue: Secondary | ICD-10-CM

## 2020-06-19 LAB — CBC WITH DIFFERENTIAL (CANCER CENTER ONLY)
Abs Immature Granulocytes: 0.03 10*3/uL (ref 0.00–0.07)
Basophils Absolute: 0 10*3/uL (ref 0.0–0.1)
Basophils Relative: 1 %
Eosinophils Absolute: 0.5 10*3/uL (ref 0.0–0.5)
Eosinophils Relative: 5 %
HCT: 34.8 % — ABNORMAL LOW (ref 39.0–52.0)
Hemoglobin: 11.3 g/dL — ABNORMAL LOW (ref 13.0–17.0)
Immature Granulocytes: 0 %
Lymphocytes Relative: 13 %
Lymphs Abs: 1.2 10*3/uL (ref 0.7–4.0)
MCH: 29.6 pg (ref 26.0–34.0)
MCHC: 32.5 g/dL (ref 30.0–36.0)
MCV: 91.1 fL (ref 80.0–100.0)
Monocytes Absolute: 1.1 10*3/uL — ABNORMAL HIGH (ref 0.1–1.0)
Monocytes Relative: 12 %
Neutro Abs: 6.1 10*3/uL (ref 1.7–7.7)
Neutrophils Relative %: 69 %
Platelet Count: 241 10*3/uL (ref 150–400)
RBC: 3.82 MIL/uL — ABNORMAL LOW (ref 4.22–5.81)
RDW: 12.7 % (ref 11.5–15.5)
WBC Count: 8.9 10*3/uL (ref 4.0–10.5)
nRBC: 0 % (ref 0.0–0.2)

## 2020-06-19 LAB — CMP (CANCER CENTER ONLY)
ALT: 14 U/L (ref 0–44)
AST: 22 U/L (ref 15–41)
Albumin: 3.5 g/dL (ref 3.5–5.0)
Alkaline Phosphatase: 111 U/L (ref 38–126)
Anion gap: 8 (ref 5–15)
BUN: 34 mg/dL — ABNORMAL HIGH (ref 8–23)
CO2: 26 mmol/L (ref 22–32)
Calcium: 9.4 mg/dL (ref 8.9–10.3)
Chloride: 103 mmol/L (ref 98–111)
Creatinine: 1.61 mg/dL — ABNORMAL HIGH (ref 0.61–1.24)
GFR, Estimated: 45 mL/min — ABNORMAL LOW (ref >60)
Glucose, Bld: 119 mg/dL — ABNORMAL HIGH (ref 70–99)
Potassium: 4.1 mmol/L (ref 3.5–5.1)
Sodium: 137 mmol/L (ref 135–145)
Total Bilirubin: 0.5 mg/dL (ref 0.3–1.2)
Total Protein: 8 g/dL (ref 6.5–8.1)

## 2020-06-19 LAB — TSH: TSH: 3.404 u[IU]/mL (ref 0.320–4.118)

## 2020-06-19 MED ORDER — INFLUENZA VAC A&B SA ADJ QUAD 0.5 ML IM PRSY
PREFILLED_SYRINGE | INTRAMUSCULAR | Status: AC
  Filled 2020-06-19: qty 0.5

## 2020-06-19 MED ORDER — SODIUM CHLORIDE 0.9 % IV SOLN
400.0000 mg | Freq: Once | INTRAVENOUS | Status: AC
  Administered 2020-06-19: 400 mg via INTRAVENOUS
  Filled 2020-06-19: qty 16

## 2020-06-19 MED ORDER — INFLUENZA VAC A&B SA ADJ QUAD 0.5 ML IM PRSY
0.5000 mL | PREFILLED_SYRINGE | Freq: Once | INTRAMUSCULAR | Status: AC
  Administered 2020-06-19: 0.5 mL via INTRAMUSCULAR

## 2020-06-19 MED ORDER — SODIUM CHLORIDE 0.9 % IV SOLN
Freq: Once | INTRAVENOUS | Status: AC
  Filled 2020-06-19: qty 250

## 2020-06-19 NOTE — Progress Notes (Signed)
Per Dr. [name], ok to treat with elevated creatinine.

## 2020-06-19 NOTE — Patient Instructions (Signed)
[facility] Discharge Instructions for Patients Receiving Chemotherapy  Today you received the following chemotherapy agents: pembrolizumab.  To help prevent nausea and vomiting after your treatment, we encourage you to take your nausea medication as directed.   If you develop nausea and vomiting that is not controlled by your nausea medication, call the clinic.   BELOW ARE SYMPTOMS THAT SHOULD BE REPORTED IMMEDIATELY:  *FEVER GREATER THAN 100.5 F  *CHILLS WITH OR WITHOUT FEVER  NAUSEA AND VOMITING THAT IS NOT CONTROLLED WITH YOUR NAUSEA MEDICATION  *UNUSUAL SHORTNESS OF BREATH  *UNUSUAL BRUISING OR BLEEDING  TENDERNESS IN MOUTH AND THROAT WITH OR WITHOUT PRESENCE OF ULCERS  *URINARY PROBLEMS  *BOWEL PROBLEMS  UNUSUAL RASH Items with * indicate a potential emergency and should be followed up as soon as possible.  Feel free to call the clinic should you have any questions or concerns. The clinic phone number is [phone].  Please show the CHEMO ALERT CARD at check-in to the Emergency Department and triage nurse.   

## 2020-06-19 NOTE — Progress Notes (Signed)
[facility] OFFICE PROGRESS NOTE   Diagnosis: Non-small cell lung cancer  INTERVAL HISTORY:   Brian Ramirez completed another treatment with pembrolizumab on 05/08/2020.  No rash or diarrhea.  He feels well.  He is exercising.  The left foot feels intermittently hot and cold.  No pain.  Objective:  Vital signs in last 24 hours:  Blood pressure 138/81, pulse 78, temperature (!) 97.2 F (36.2 C), temperature source Tympanic, resp. rate 18, height _0  (1.854 m), weight 154 lb 11.2 oz (70.2 kg), SpO2 99 %.    Lymphatics: No cervical, supraclavicular, or axillary nodes Resp: Decreased breath sounds throughout the right chest, no respiratory distress Cardio: Irregular GI: No hepatosplenomegaly, nontender Vascular: No leg edema, the left foot is warm with a good DP pulse    Lab Results:  Lab Results  Component Value Date   WBC 8.9 06/19/2020   HGB 11.3 (L) 06/19/2020   HCT 34.8 (L) 06/19/2020   MCV 91.1 06/19/2020   PLT 241 06/19/2020   NEUTROABS 6.1 06/19/2020    CMP  Lab Results  Component Value Date   NA 137 06/19/2020   K 4.1 06/19/2020   CL 103 06/19/2020   CO2 26 06/19/2020   GLUCOSE 119 (H) 06/19/2020   BUN 34 (H) 06/19/2020   CREATININE 1.61 (H) 06/19/2020   CALCIUM 9.4 06/19/2020   PROT 8.0 06/19/2020   ALBUMIN 3.5 06/19/2020   AST 22 06/19/2020   ALT 14 06/19/2020   ALKPHOS 111 06/19/2020   BILITOT 0.5 06/19/2020   GFRNONAA 45 (L) 06/19/2020   GFRAA 46 (L) 05/08/2020    Medications: I have reviewed the patient's current medications.   Assessment/Plan:  1. Metastatic non-small cell lung cancer  largeright pleural effusion, right lung mass, right pleural-based masses, left lung nodules  Right thoracentesis 09/04/2018-negative cytology  CT biopsy of right lateral pleural mass 09/07/2018-poorly differentiated non-small cell carcinoma, malignant cells positive for cytokeratin AE1/AE3 and cytokeratin 8/18, histology favoring  adenocarcinoma, MSS, tumor mutation burden 5.no EGFR, ALK, BRAF alteration. PDL 1 rearrangement  PDL 1 tumor proportion score-100%   Cycle 1 Alimta/carboplatin 09/22/2018, pembrolizumab 09/28/2018  Cycle 2 Alimta/carboplatin/pembrolizumab 10/15/2018  Cycle 3 Alimta/carboplatin/pembrolizumab 11/06/2018  Cycle4Alimta/carboplatin/pembrolizumab 11/28/2018  CT chest 12/14/2018-response to therapy of lung primary/primaries, thoracic nodal and pulmonary metastasis. Decrease in right pleural effusion with improvement in pleural-based metastasis. New T10 heterogeneous sclerotic density likely due to healing of previously CT occult metastasis.  Pembrolizumab 12/19/2018  Pembrolizumab/Alimta starting 01/09/2019  Pembrolizumab/Alimta 01/31/2019  Pembrolizumab/Alimta 02/21/2019  Pembrolizumab/Alimta 03/14/2019  CT 04/01/2019-no evidence of disease progression, small loculated right pleural effusion and right basilar pleural nodularity-stable, stable left lower lobe nodule, persistent sclerosis at T10  Pembrolizumab/Alimta 04/05/2019  Pembrolizumab 05/07/2019  Pembrolizumab 05/28/2019  Pembrolizumab 06/18/2019  Pembrolizumab 07/09/2019  Chest CT 07/26/2019-moderate to large stable exudative right effusion with considerable pleural thickening. Nodularity at the right lung base along pleural thickening similar to prior. Stable left paramediastinal groundglass density nodule. Stable sclerotic lesion posteriorly at the T10 vertebral level.  Pembrolizumab 07/30/2019  Pembrolizumab 08/21/2019-changed to every 6-week dosing  Pembrolizumab 10/11/2019  Pembrolizumab 11/22/2019  CT chest 01/03/2020-chronic appearing loculated effusion right chest with signs of pleural thickening smaller than on prior exam; similar appearance of tree-in-bud bud nodularity in the right upper lobe and left lung apex along with bronchiectatic changes in the lingula and middle lobe; similar appearance of sclerosis T10 vertebral  body  Pembrolizumab 01/03/2020  Pembrolizumab 02/14/2020  Pembrolizumab 03/27/2020  Chest CT 05/06/2020-unchanged posttreatment appearance of  the chest with a chronic, loculated moderate right pleural effusion with associated atelectasis or consolidation and pleural thickening.  Stable small pulmonary nodules measuring 3 mm or smaller.   Pembrolizumab 05/08/2020   2.Severe anemia-likely secondary to bleeding in the right pleural space and metastatic carcinoma, red cell transfusions 09/09/2018 09/10/2018, 09/23/2018-improved;progressive 04/26/2019, red cell transfusion.  Improved. 3.Dyspnea secondary to the large right pleural effusion and anemia  Right Pleurx catheter placed 09/11/2018  Chest x-ray 10/09/2018-large right pleural effusion, loculated  Chest x-ray 11/06/2018-no interval change in the right-sided pleural effusion  Pleurx drainage catheter removed 11/13/2018 4.History of tobacco use 5.History of kidney stones 6. Fever-most likely tumor fever, improved 7. Admission 10/08/2018 with rapid atrial fibrillation/flutter-improved with diltiazem, started on Eliquis anticoagulation. Eliquis discontinued. Now on aspirin. 8.Renal insufficiency      Disposition: [name] appears stable.  He is tolerating the pembrolizumab well.  There is no clinical evidence for progression of the non-small cell lung cancer.  He will complete another treatment with pembrolizumab today.  He will return for an office visit and pembrolizumab in 6 weeks.  He will complete 2 years of pembrolizumab in late January.  Betsy Coder, MD  06/19/2020  9:54 AM

## 2020-06-22 ENCOUNTER — Telehealth: Payer: Self-pay | Admitting: Oncology

## 2020-06-22 NOTE — Telephone Encounter (Signed)
Scheduled appointments per 11/5 los. Called patient, no answer. Left message for patient with appointment date and time.

## 2020-08-03 ENCOUNTER — Other Ambulatory Visit: Payer: Self-pay

## 2020-08-03 ENCOUNTER — Inpatient Hospital Stay (HOSPITAL_BASED_OUTPATIENT_CLINIC_OR_DEPARTMENT_OTHER): Payer: Medicare Other | Admitting: Nurse Practitioner

## 2020-08-03 ENCOUNTER — Inpatient Hospital Stay: Payer: Medicare Other

## 2020-08-03 ENCOUNTER — Inpatient Hospital Stay: Payer: Medicare Other | Attending: Oncology

## 2020-08-03 ENCOUNTER — Encounter: Payer: Self-pay | Admitting: Nurse Practitioner

## 2020-08-03 VITALS — BP 136/77 | HR 93 | Temp 97.7°F | Resp 14 | Ht 73.0 in | Wt 153.6 lb

## 2020-08-03 DIAGNOSIS — C782 Secondary malignant neoplasm of pleura: Secondary | ICD-10-CM | POA: Insufficient documentation

## 2020-08-03 DIAGNOSIS — C3491 Malignant neoplasm of unspecified part of right bronchus or lung: Secondary | ICD-10-CM | POA: Diagnosis not present

## 2020-08-03 DIAGNOSIS — C3490 Malignant neoplasm of unspecified part of unspecified bronchus or lung: Secondary | ICD-10-CM

## 2020-08-03 DIAGNOSIS — Z5112 Encounter for antineoplastic immunotherapy: Secondary | ICD-10-CM | POA: Insufficient documentation

## 2020-08-03 DIAGNOSIS — R5383 Other fatigue: Secondary | ICD-10-CM

## 2020-08-03 DIAGNOSIS — Z79899 Other long term (current) drug therapy: Secondary | ICD-10-CM | POA: Insufficient documentation

## 2020-08-03 LAB — CBC WITH DIFFERENTIAL (CANCER CENTER ONLY)
Abs Immature Granulocytes: 0.04 10*3/uL (ref 0.00–0.07)
Basophils Absolute: 0 10*3/uL (ref 0.0–0.1)
Basophils Relative: 0 %
Eosinophils Absolute: 0.3 10*3/uL (ref 0.0–0.5)
Eosinophils Relative: 4 %
HCT: 36.1 % — ABNORMAL LOW (ref 39.0–52.0)
Hemoglobin: 11.4 g/dL — ABNORMAL LOW (ref 13.0–17.0)
Immature Granulocytes: 0 %
Lymphocytes Relative: 12 %
Lymphs Abs: 1.1 10*3/uL (ref 0.7–4.0)
MCH: 29.4 pg (ref 26.0–34.0)
MCHC: 31.6 g/dL (ref 30.0–36.0)
MCV: 93 fL (ref 80.0–100.0)
Monocytes Absolute: 1 10*3/uL (ref 0.1–1.0)
Monocytes Relative: 11 %
Neutro Abs: 6.6 10*3/uL (ref 1.7–7.7)
Neutrophils Relative %: 73 %
Platelet Count: 252 10*3/uL (ref 150–400)
RBC: 3.88 MIL/uL — ABNORMAL LOW (ref 4.22–5.81)
RDW: 13 % (ref 11.5–15.5)
WBC Count: 9 10*3/uL (ref 4.0–10.5)
nRBC: 0 % (ref 0.0–0.2)

## 2020-08-03 LAB — CMP (CANCER CENTER ONLY)
ALT: 16 U/L (ref 0–44)
AST: 22 U/L (ref 15–41)
Albumin: 3.3 g/dL — ABNORMAL LOW (ref 3.5–5.0)
Alkaline Phosphatase: 107 U/L (ref 38–126)
Anion gap: 8 (ref 5–15)
BUN: 31 mg/dL — ABNORMAL HIGH (ref 8–23)
CO2: 24 mmol/L (ref 22–32)
Calcium: 9.6 mg/dL (ref 8.9–10.3)
Chloride: 104 mmol/L (ref 98–111)
Creatinine: 1.56 mg/dL — ABNORMAL HIGH (ref 0.61–1.24)
GFR, Estimated: 47 mL/min — ABNORMAL LOW (ref >60)
Glucose, Bld: 109 mg/dL — ABNORMAL HIGH (ref 70–99)
Potassium: 4.3 mmol/L (ref 3.5–5.1)
Sodium: 136 mmol/L (ref 135–145)
Total Bilirubin: 0.4 mg/dL (ref 0.3–1.2)
Total Protein: 8.1 g/dL (ref 6.5–8.1)

## 2020-08-03 LAB — TSH: TSH: 3.005 u[IU]/mL (ref 0.320–4.118)

## 2020-08-03 MED ORDER — SODIUM CHLORIDE 0.9 % IV SOLN
400.0000 mg | Freq: Once | INTRAVENOUS | Status: AC
  Administered 2020-08-03: 400 mg via INTRAVENOUS
  Filled 2020-08-03: qty 16

## 2020-08-03 MED ORDER — SODIUM CHLORIDE 0.9 % IV SOLN
Freq: Once | INTRAVENOUS | Status: AC
  Filled 2020-08-03: qty 250

## 2020-08-03 NOTE — Patient Instructions (Signed)
[facility] Discharge Instructions for Patients Receiving Chemotherapy  Today you received the following chemotherapy agent: pembrolizumab.   To help prevent nausea and vomiting after your treatment, we encourage you to take your nausea medication as directed.    If you develop nausea and vomiting that is not controlled by your nausea medication, call the clinic.   BELOW ARE SYMPTOMS THAT SHOULD BE REPORTED IMMEDIATELY:  *FEVER GREATER THAN 100.5 F  *CHILLS WITH OR WITHOUT FEVER  NAUSEA AND VOMITING THAT IS NOT CONTROLLED WITH YOUR NAUSEA MEDICATION  *UNUSUAL SHORTNESS OF BREATH  *UNUSUAL BRUISING OR BLEEDING  TENDERNESS IN MOUTH AND THROAT WITH OR WITHOUT PRESENCE OF ULCERS  *URINARY PROBLEMS  *BOWEL PROBLEMS  UNUSUAL RASH Items with * indicate a potential emergency and should be followed up as soon as possible.  Feel free to call the clinic should you have any questions or concerns. The clinic phone number is [phone].  Please show the [place] at check-in to the Emergency Department and triage nurse.

## 2020-08-03 NOTE — Addendum Note (Signed)
Addended by: Betsy Coder B on: 08/03/2020 11:31 AM   Modules accepted: Orders

## 2020-08-03 NOTE — Progress Notes (Signed)
[place] [facility] OFFICE PROGRESS NOTE   Diagnosis: Non-small cell lung cancer  INTERVAL HISTORY:   Brian Ramirez returns as scheduled.  He completed another cycle of Pembrolizumab 06/19/2020.  He feels well.  No diarrhea or rash.  No nausea or vomiting.  No fever, cough, shortness of breath.  He describes his appetite as "excellent".  He continues to exercise.  Objective:  Vital signs in last 24 hours:  Blood pressure 136/77, pulse 93, temperature 97.7 F (36.5 C), temperature source Tympanic, resp. rate 14, height '6\' 1"'  (1.854 m), weight 153 lb 9.6 oz (69.7 kg), SpO2 96 %.    HEENT: No thrush or ulcers. Resp: Diminished breath sounds of the right chest.  No respiratory distress. Cardio: Irregular. GI: Abdomen soft and nontender.  No hepatomegaly. Vascular: No leg edema.  Skin: No rash.   Lab Results:  Lab Results  Component Value Date   WBC 9.0 08/03/2020   HGB 11.4 (L) 08/03/2020   HCT 36.1 (L) 08/03/2020   MCV 93.0 08/03/2020   PLT 252 08/03/2020   NEUTROABS 6.6 08/03/2020    Imaging:  No results found.  Medications: I have reviewed the patient's current medications.  Assessment/Plan: 1. Metastatic non-small cell lung cancer  largeright pleural effusion, right lung mass, right pleural-based masses, left lung nodules  Right thoracentesis 09/04/2018-negative cytology  CT biopsy of right lateral pleural mass 09/07/2018-poorly differentiated non-small cell carcinoma, malignant cells positive for cytokeratin AE1/AE3 and cytokeratin 8/18, histology favoring adenocarcinoma, MSS, tumor mutation burden 5.no EGFR, ALK, BRAF alteration. PDL 1 rearrangement  PDL 1 tumor proportion score-100%   Cycle 1 Alimta/carboplatin 09/22/2018, pembrolizumab 09/28/2018  Cycle 2 Alimta/carboplatin/pembrolizumab 10/15/2018  Cycle 3 Alimta/carboplatin/pembrolizumab 11/06/2018  Cycle4Alimta/carboplatin/pembrolizumab 11/28/2018  CT chest 12/14/2018-response to therapy of  lung primary/primaries, thoracic nodal and pulmonary metastasis. Decrease in right pleural effusion with improvement in pleural-based metastasis. New T10 heterogeneous sclerotic density likely due to healing of previously CT occult metastasis.  Pembrolizumab 12/19/2018  Pembrolizumab/Alimta starting 01/09/2019  Pembrolizumab/Alimta 01/31/2019  Pembrolizumab/Alimta 02/21/2019  Pembrolizumab/Alimta 03/14/2019  CT 04/01/2019-no evidence of disease progression, small loculated right pleural effusion and right basilar pleural nodularity-stable, stable left lower lobe nodule, persistent sclerosis at T10  Pembrolizumab/Alimta 04/05/2019  Pembrolizumab 05/07/2019  Pembrolizumab 05/28/2019  Pembrolizumab 06/18/2019  Pembrolizumab 07/09/2019  Chest CT 07/26/2019-moderate to large stable exudative right effusion with considerable pleural thickening. Nodularity at the right lung base along pleural thickening similar to prior. Stable left paramediastinal groundglass density nodule. Stable sclerotic lesion posteriorly at the T10 vertebral level.  Pembrolizumab 07/30/2019  Pembrolizumab 08/21/2019-changed to every 6-week dosing  Pembrolizumab 10/11/2019  Pembrolizumab 11/22/2019  CT chest 01/03/2020-chronic appearing loculated effusion right chest with signs of pleural thickening smaller than on prior exam; similar appearance of tree-in-bud bud nodularity in the right upper lobe and left lung apex along with bronchiectatic changes in the lingula and middle lobe; similar appearance of sclerosis T10 vertebral body  Pembrolizumab 01/03/2020  Pembrolizumab 02/14/2020  Pembrolizumab 03/27/2020  Chest CT 05/06/2020-unchanged posttreatment appearance of the chest with a chronic, loculated moderate right pleural effusion with associated atelectasis or consolidation and pleural thickening.  Stable small pulmonary nodules measuring 3 mm or smaller.   Pembrolizumab 05/08/2020  Pembrolizumab  06/19/2020  Pembrolizumab 08/03/2020   2.Severe anemia-likely secondary to bleeding in the right pleural space and metastatic carcinoma, red cell transfusions 09/09/2018 09/10/2018, 09/23/2018-improved;progressive 04/26/2019, red cell transfusion.Improved. 3.Dyspnea secondary to the large right pleural effusion and anemia  Right Pleurx catheter placed 09/11/2018  Chest x-ray 10/09/2018-large right pleural  effusion, loculated  Chest x-ray 11/06/2018-no interval change in the right-sided pleural effusion  Pleurx drainage catheter removed 11/13/2018 4.History of tobacco use 5.History of kidney stones 6. Fever-most likely tumor fever, improved 7. Admission 10/08/2018 with rapid atrial fibrillation/flutter-improved with diltiazem, started on Eliquis anticoagulation. Eliquis discontinued. Now on aspirin. 8.Renal insufficiency  Disposition: Brian Ramirez appears stable.  He continues to tolerate Pembrolizumab well.  There is no clinical evidence for progression of the non-small cell lung cancer.  Plan to proceed with treatment today as scheduled.  He will return for an office visit and Pembrolizumab in 6 weeks.    Brian Ramirez ANP/GNP-BC   08/03/2020  11:06 AM

## 2020-08-03 NOTE — Progress Notes (Signed)
Ok to treat today with abnormal labs per NP Lattie Haw.

## 2020-09-06 ENCOUNTER — Other Ambulatory Visit: Payer: Self-pay | Admitting: Cardiology

## 2020-09-07 ENCOUNTER — Other Ambulatory Visit: Payer: Self-pay | Admitting: Oncology

## 2020-09-14 ENCOUNTER — Inpatient Hospital Stay: Payer: Medicare Other | Attending: Oncology

## 2020-09-14 ENCOUNTER — Inpatient Hospital Stay (HOSPITAL_BASED_OUTPATIENT_CLINIC_OR_DEPARTMENT_OTHER): Payer: Medicare Other | Admitting: Oncology

## 2020-09-14 ENCOUNTER — Other Ambulatory Visit: Payer: Self-pay

## 2020-09-14 ENCOUNTER — Inpatient Hospital Stay: Payer: Medicare Other

## 2020-09-14 VITALS — BP 143/79 | HR 81 | Temp 97.2°F | Resp 18 | Ht 73.0 in | Wt 152.7 lb

## 2020-09-14 DIAGNOSIS — Z79899 Other long term (current) drug therapy: Secondary | ICD-10-CM | POA: Insufficient documentation

## 2020-09-14 DIAGNOSIS — C3490 Malignant neoplasm of unspecified part of unspecified bronchus or lung: Secondary | ICD-10-CM

## 2020-09-14 DIAGNOSIS — Z5112 Encounter for antineoplastic immunotherapy: Secondary | ICD-10-CM | POA: Diagnosis not present

## 2020-09-14 DIAGNOSIS — C782 Secondary malignant neoplasm of pleura: Secondary | ICD-10-CM | POA: Insufficient documentation

## 2020-09-14 DIAGNOSIS — C3491 Malignant neoplasm of unspecified part of right bronchus or lung: Secondary | ICD-10-CM | POA: Diagnosis not present

## 2020-09-14 LAB — CBC WITH DIFFERENTIAL (CANCER CENTER ONLY)
Abs Immature Granulocytes: 0.03 10*3/uL (ref 0.00–0.07)
Basophils Absolute: 0 10*3/uL (ref 0.0–0.1)
Basophils Relative: 0 %
Eosinophils Absolute: 0.6 10*3/uL — ABNORMAL HIGH (ref 0.0–0.5)
Eosinophils Relative: 7 %
HCT: 38.6 % — ABNORMAL LOW (ref 39.0–52.0)
Hemoglobin: 12.2 g/dL — ABNORMAL LOW (ref 13.0–17.0)
Immature Granulocytes: 0 %
Lymphocytes Relative: 15 %
Lymphs Abs: 1.3 10*3/uL (ref 0.7–4.0)
MCH: 28.9 pg (ref 26.0–34.0)
MCHC: 31.6 g/dL (ref 30.0–36.0)
MCV: 91.5 fL (ref 80.0–100.0)
Monocytes Absolute: 1 10*3/uL (ref 0.1–1.0)
Monocytes Relative: 12 %
Neutro Abs: 5.7 10*3/uL (ref 1.7–7.7)
Neutrophils Relative %: 66 %
Platelet Count: 258 10*3/uL (ref 150–400)
RBC: 4.22 MIL/uL (ref 4.22–5.81)
RDW: 13.2 % (ref 11.5–15.5)
WBC Count: 8.7 10*3/uL (ref 4.0–10.5)
nRBC: 0 % (ref 0.0–0.2)

## 2020-09-14 LAB — CMP (CANCER CENTER ONLY)
ALT: 11 U/L (ref 0–44)
AST: 19 U/L (ref 15–41)
Albumin: 3.5 g/dL (ref 3.5–5.0)
Alkaline Phosphatase: 106 U/L (ref 38–126)
Anion gap: 9 (ref 5–15)
BUN: 32 mg/dL — ABNORMAL HIGH (ref 8–23)
CO2: 27 mmol/L (ref 22–32)
Calcium: 9.7 mg/dL (ref 8.9–10.3)
Chloride: 104 mmol/L (ref 98–111)
Creatinine: 1.61 mg/dL — ABNORMAL HIGH (ref 0.61–1.24)
GFR, Estimated: 45 mL/min — ABNORMAL LOW (ref >60)
Glucose, Bld: 106 mg/dL — ABNORMAL HIGH (ref 70–99)
Potassium: 4.3 mmol/L (ref 3.5–5.1)
Sodium: 140 mmol/L (ref 135–145)
Total Bilirubin: 0.4 mg/dL (ref 0.3–1.2)
Total Protein: 8.3 g/dL — ABNORMAL HIGH (ref 6.5–8.1)

## 2020-09-14 MED ORDER — SODIUM CHLORIDE 0.9 % IV SOLN
Freq: Once | INTRAVENOUS | Status: AC
  Filled 2020-09-14: qty 250

## 2020-09-14 MED ORDER — SODIUM CHLORIDE 0.9 % IV SOLN
400.0000 mg | Freq: Once | INTRAVENOUS | Status: AC
  Administered 2020-09-14: 400 mg via INTRAVENOUS
  Filled 2020-09-14: qty 16

## 2020-09-14 NOTE — Progress Notes (Signed)
Okay to treat today with SCr 1.56 per Dr. [name].

## 2020-09-14 NOTE — Progress Notes (Signed)
[facility] OFFICE PROGRESS NOTE   Diagnosis: Non-small cell lung cancer  INTERVAL HISTORY:   Brian Ramirez completed another treatment with pembrolizumab on 08/03/2020.  No rash or diarrhea.  He feels well.  He is exercising.  He is no longer using oxygen.  Objective:  Vital signs in last 24 hours:  Blood pressure (!) 143/79, pulse 81, temperature (!) 97.2 F (36.2 C), temperature source Tympanic, resp. rate 18, height '6\' 1"'  (1.854 m), weight 152 lb 11.2 oz (69.3 kg), SpO2 98 %.    Lymphatics: No cervical, supraclavicular, or axillary nodes Resp: Decreased breath sounds over the right chest, no respiratory distress Cardio: Regular rate and rhythm GI: No hepatosplenomegaly, no mass, nontender Vascular: No leg edema   Lab Results:  Lab Results  Component Value Date   WBC 8.7 09/14/2020   HGB 12.2 (L) 09/14/2020   HCT 38.6 (L) 09/14/2020   MCV 91.5 09/14/2020   PLT 258 09/14/2020   NEUTROABS 5.7 09/14/2020    CMP  Lab Results  Component Value Date   NA 136 08/03/2020   K 4.3 08/03/2020   CL 104 08/03/2020   CO2 24 08/03/2020   GLUCOSE 109 (H) 08/03/2020   BUN 31 (H) 08/03/2020   CREATININE 1.56 (H) 08/03/2020   CALCIUM 9.6 08/03/2020   PROT 8.1 08/03/2020   ALBUMIN 3.3 (L) 08/03/2020   AST 22 08/03/2020   ALT 16 08/03/2020   ALKPHOS 107 08/03/2020   BILITOT 0.4 08/03/2020   GFRNONAA 47 (L) 08/03/2020   GFRAA 46 (L) 05/08/2020    Medications: I have reviewed the patient's current medications.   Assessment/Plan: 1.  Metastatic non-small cell lung cancer  largeright pleural effusion, right lung mass, right pleural-based masses, left lung nodules  Right thoracentesis 09/04/2018-negative cytology  CT biopsy of right lateral pleural mass 09/07/2018-poorly differentiated non-small cell carcinoma, malignant cells positive for cytokeratin AE1/AE3 and cytokeratin 8/18, histology favoring adenocarcinoma, MSS, tumor mutation burden 5.no EGFR, ALK,  BRAF alteration. PDL 1 rearrangement  PDL 1 tumor proportion score-100%   Cycle 1 Alimta/carboplatin 09/22/2018, pembrolizumab 09/28/2018  Cycle 2 Alimta/carboplatin/pembrolizumab 10/15/2018  Cycle 3 Alimta/carboplatin/pembrolizumab 11/06/2018  Cycle4Alimta/carboplatin/pembrolizumab 11/28/2018  CT chest 12/14/2018-response to therapy of lung primary/primaries, thoracic nodal and pulmonary metastasis. Decrease in right pleural effusion with improvement in pleural-based metastasis. New T10 heterogeneous sclerotic density likely due to healing of previously CT occult metastasis.  Pembrolizumab 12/19/2018  Pembrolizumab/Alimta starting 01/09/2019  Pembrolizumab/Alimta 01/31/2019  Pembrolizumab/Alimta 02/21/2019  Pembrolizumab/Alimta 03/14/2019  CT 04/01/2019-no evidence of disease progression, small loculated right pleural effusion and right basilar pleural nodularity-stable, stable left lower lobe nodule, persistent sclerosis at T10  Pembrolizumab/Alimta 04/05/2019  Pembrolizumab 05/07/2019  Pembrolizumab 05/28/2019  Pembrolizumab 06/18/2019  Pembrolizumab 07/09/2019  Chest CT 07/26/2019-moderate to large stable exudative right effusion with considerable pleural thickening. Nodularity at the right lung base along pleural thickening similar to prior. Stable left paramediastinal groundglass density nodule. Stable sclerotic lesion posteriorly at the T10 vertebral level.  Pembrolizumab 07/30/2019  Pembrolizumab 08/21/2019-changed to every 6-week dosing  Pembrolizumab 10/11/2019  Pembrolizumab 11/22/2019  CT chest 01/03/2020-chronic appearing loculated effusion right chest with signs of pleural thickening smaller than on prior exam; similar appearance of tree-in-bud bud nodularity in the right upper lobe and left lung apex along with bronchiectatic changes in the lingula and middle lobe; similar appearance of sclerosis T10 vertebral body  Pembrolizumab 01/03/2020  Pembrolizumab  02/14/2020  Pembrolizumab 03/27/2020  Chest CT 05/06/2020-unchanged posttreatment appearance of the chest with a chronic, loculated moderate right pleural  effusion with associated atelectasis or consolidation and pleural thickening.  Stable small pulmonary nodules measuring 3 mm or smaller.   Pembrolizumab 05/08/2020  Pembrolizumab 06/19/2020  Pembrolizumab 08/03/2020  Pembrolizumab 09/14/2020   2.Severe anemia-likely secondary to bleeding in the right pleural space and metastatic carcinoma, red cell transfusions 09/09/2018 09/10/2018, 09/23/2018-improved;progressive 04/26/2019, red cell transfusion.Improved. 3.Dyspnea secondary to the large right pleural effusion and anemia  Right Pleurx catheter placed 09/11/2018  Chest x-ray 10/09/2018-large right pleural effusion, loculated  Chest x-ray 11/06/2018-no interval change in the right-sided pleural effusion  Pleurx drainage catheter removed 11/13/2018 4.History of tobacco use 5.History of kidney stones 6. Fever-most likely tumor fever, improved 7. Admission 10/08/2018 with rapid atrial fibrillation/flutter-improved with diltiazem, started on Eliquis anticoagulation. Eliquis discontinued. Now on aspirin. 8.Renal insufficiency   Disposition: Brian Ramirez appears stable.  He is in clinical remission from lung cancer.  He has been maintained on pembrolizumab since February 2020.  He will complete a final treatment with pembrolizumab today.  Brian Ramirez will return for an office visit and restaging chest CT in 2 months.  Betsy Coder, MD  09/14/2020  12:11 PM

## 2020-09-14 NOTE — Patient Instructions (Signed)
[facility] Discharge Instructions for Patients Receiving Chemotherapy  Today you received the following chemotherapy agent: pembrolizumab.   To help prevent nausea and vomiting after your treatment, we encourage you to take your nausea medication as directed.    If you develop nausea and vomiting that is not controlled by your nausea medication, call the clinic.   BELOW ARE SYMPTOMS THAT SHOULD BE REPORTED IMMEDIATELY:  *FEVER GREATER THAN 100.5 F  *CHILLS WITH OR WITHOUT FEVER  NAUSEA AND VOMITING THAT IS NOT CONTROLLED WITH YOUR NAUSEA MEDICATION  *UNUSUAL SHORTNESS OF BREATH  *UNUSUAL BRUISING OR BLEEDING  TENDERNESS IN MOUTH AND THROAT WITH OR WITHOUT PRESENCE OF ULCERS  *URINARY PROBLEMS  *BOWEL PROBLEMS  UNUSUAL RASH Items with * indicate a potential emergency and should be followed up as soon as possible.  Feel free to call the clinic should you have any questions or concerns. The clinic phone number is [phone].  Please show the [facility] at check-in to the Emergency Department and triage nurse.

## 2020-09-15 ENCOUNTER — Telehealth: Payer: Self-pay | Admitting: Oncology

## 2020-09-15 NOTE — Telephone Encounter (Signed)
Scheduled appointment per 1/31 los. Called patient, no answer. Left message with appointment date and time.

## 2020-11-04 ENCOUNTER — Other Ambulatory Visit: Payer: Self-pay

## 2020-11-04 ENCOUNTER — Ambulatory Visit
Admission: RE | Admit: 2020-11-04 | Discharge: 2020-11-04 | Disposition: A | Discharge status: Home / Self Care | Payer: Medicare Other | Source: Ambulatory Visit | Attending: Oncology | Admitting: Oncology

## 2020-11-04 DIAGNOSIS — J984 Other disorders of lung: Secondary | ICD-10-CM | POA: Diagnosis not present

## 2020-11-04 DIAGNOSIS — J439 Emphysema, unspecified: Secondary | ICD-10-CM | POA: Diagnosis not present

## 2020-11-04 DIAGNOSIS — I2510 Atherosclerotic heart disease of native coronary artery without angina pectoris: Secondary | ICD-10-CM | POA: Diagnosis not present

## 2020-11-04 DIAGNOSIS — C3490 Malignant neoplasm of unspecified part of unspecified bronchus or lung: Secondary | ICD-10-CM

## 2020-11-09 ENCOUNTER — Inpatient Hospital Stay: Payer: Medicare Other | Attending: Oncology | Admitting: Nurse Practitioner

## 2020-11-09 ENCOUNTER — Other Ambulatory Visit: Payer: Self-pay

## 2020-11-09 ENCOUNTER — Encounter: Payer: Self-pay | Admitting: Nurse Practitioner

## 2020-11-09 VITALS — BP 140/78 | HR 86 | Temp 97.7°F | Resp 15 | Ht 73.0 in | Wt 151.4 lb

## 2020-11-09 DIAGNOSIS — Z85118 Personal history of other malignant neoplasm of bronchus and lung: Secondary | ICD-10-CM | POA: Insufficient documentation

## 2020-11-09 DIAGNOSIS — C3490 Malignant neoplasm of unspecified part of unspecified bronchus or lung: Secondary | ICD-10-CM | POA: Diagnosis not present

## 2020-11-09 DIAGNOSIS — Z9221 Personal history of antineoplastic chemotherapy: Secondary | ICD-10-CM | POA: Diagnosis not present

## 2020-11-09 NOTE — Progress Notes (Addendum)
Brian Ramirez OFFICE PROGRESS NOTE   Diagnosis: Non-small cell lung cancer  INTERVAL HISTORY:   Brian Ramirez returns as scheduled.  He completed the final cycle of Pembrolizumab 09/14/2020. He feels well. He is exercising. He has a good appetite. Has occasional pain at the left lower abdomen, relieved with a bowel movement. Denies cough and dyspnea.   Objective:  Vital signs in last 24 hours:  Blood pressure 140/78, pulse 86, temperature 97.7 F (36.5 C), temperature source Tympanic, resp. rate 15, height '6\' 1"'  (1.854 m), weight 151 lb 6.4 oz (68.7 kg), SpO2 100 %.    HEENT: Neck without mass.  Lymphatics: No palpable cervical, supraclavicular or axillary lymph nodes. Resp: Decreased breath sounds over the right chest.  No respiratory distress. Cardio: Regular rate and rhythm. GI: Abdomen soft and nontender.  No hepatomegaly.  No mass. Vascular: No leg edema.    Lab Results:  Lab Results  Component Value Date   WBC 8.7 09/14/2020   HGB 12.2 (Brian) 09/14/2020   HCT 38.6 (Brian) 09/14/2020   MCV 91.5 09/14/2020   PLT 258 09/14/2020   NEUTROABS 5.7 09/14/2020    Medications: I have reviewed the patient'Brian current medications.  Assessment/Plan: 1. Metastatic non-small cell lung cancer  largeright pleural effusion, right lung mass, right pleural-based masses, left lung nodules  Right thoracentesis 09/04/2018-negative cytology  CT biopsy of right lateral pleural mass 09/07/2018-poorly differentiated non-small cell carcinoma, malignant cells positive for cytokeratin AE1/AE3 and cytokeratin 8/18, histology favoring adenocarcinoma, MSS, tumor mutation burden 5.no EGFR, ALK, BRAF alteration. PDL 1 rearrangement  PDL 1 tumor proportion score-100%   Cycle 1 Alimta/carboplatin 09/22/2018, pembrolizumab 09/28/2018  Cycle 2 Alimta/carboplatin/pembrolizumab 10/15/2018  Cycle 3 Alimta/carboplatin/pembrolizumab 11/06/2018  Cycle4Alimta/carboplatin/pembrolizumab  11/28/2018  CT chest 12/14/2018-response to therapy of lung primary/primaries, thoracic nodal and pulmonary metastasis. Decrease in right pleural effusion with improvement in pleural-based metastasis. New T10 heterogeneous sclerotic density likely due to healing of previously CT occult metastasis.  Pembrolizumab 12/19/2018  Pembrolizumab/Alimta starting 01/09/2019  Pembrolizumab/Alimta 01/31/2019  Pembrolizumab/Alimta 02/21/2019  Pembrolizumab/Alimta 03/14/2019  CT 04/01/2019-no evidence of disease progression, small loculated right pleural effusion and right basilar pleural nodularity-stable, stable left lower lobe nodule, persistent sclerosis at T10  Pembrolizumab/Alimta 04/05/2019  Pembrolizumab 05/07/2019  Pembrolizumab 05/28/2019  Pembrolizumab 06/18/2019  Pembrolizumab 07/09/2019  Chest CT 07/26/2019-moderate to large stable exudative right effusion with considerable pleural thickening. Nodularity at the right lung base along pleural thickening similar to prior. Stable left paramediastinal groundglass density nodule. Stable sclerotic lesion posteriorly at the T10 vertebral level.  Pembrolizumab 07/30/2019  Pembrolizumab 08/21/2019-changed to every 6-week dosing  Pembrolizumab 10/11/2019  Pembrolizumab 11/22/2019  CT chest 01/03/2020-chronic appearing loculated effusion right chest with signs of pleural thickening smaller than on prior exam; similar appearance of tree-in-bud bud nodularity in the right upper lobe and left lung apex along with bronchiectatic changes in the lingula and middle lobe; similar appearance of sclerosis T10 vertebral body  Pembrolizumab 01/03/2020  Pembrolizumab 02/14/2020  Pembrolizumab 03/27/2020  Chest CT 05/06/2020-unchanged posttreatment appearance of the chest with a chronic, loculated moderate right pleural effusion with associated atelectasis or consolidation and pleural thickening. Stable small pulmonary nodules measuring 3 mm or  smaller.  Pembrolizumab 05/08/2020  Pembrolizumab 06/19/2020  Pembrolizumab 08/03/2020  Pembrolizumab 09/14/2020  CT chest 11/04/2020-persistent and enlarging complex loculated right pleural fluid collection.  No findings suspicious for recurrent tumor, mediastinal/hilar adenopathy or pulmonary metastatic disease.   2.Severe anemia-likely secondary to bleeding in the right pleural space and metastatic carcinoma, red cell transfusions  09/09/2018 09/10/2018, 09/23/2018-improved;progressive 04/26/2019, red cell transfusion.Improved. 3.Dyspnea secondary to the large right pleural effusion and anemia  Right Pleurx catheter placed 09/11/2018  Chest x-ray 10/09/2018-large right pleural effusion, loculated  Chest x-ray 11/06/2018-no interval change in the right-sided pleural effusion  Pleurx drainage catheter removed 11/13/2018 4.History of tobacco use 5.History of kidney stones 6. Fever-most likely tumor fever, improved 7. Admission 10/08/2018 with rapid atrial fibrillation/flutter-improved with diltiazem, started on Eliquis anticoagulation. Eliquis discontinued. Now on aspirin. 8.Renal insufficiency  Disposition: Brian Ramirez appears stable.  He remains in remission from lung cancer.  Recent restaging chest CT shows no evidence of disease progression.  He has a persistent loculated right pleural fluid collection that measured slightly larger.  Brian Ramirez reviewed the CT report/images with him at today'Brian visit.  He will return for follow-up in 3 months, next chest CT at a 57-monthinterval.  Patient seen with Brian Ramirez    Brian TokunagaANP/GNP-BC   11/09/2020  2:23 PM This was a shared visit with Brian Ramirez  Mr. KDudenhoefferremains in clinical remission from lung cancer.  The restaging CT shows a persistent/slightly progressive right pleural fluid collection and no evidence of disease progression.  The plan is to follow him with observation.  We will repeat a CT in 3 months  to follow-up on the fluid collection.  We reviewed the CT images with Brian Ramirez  I was present for greater than 50% of today'Brian visit.  I perform medical decision making.  Brian Ramirez

## 2020-11-12 ENCOUNTER — Other Ambulatory Visit: Payer: Self-pay | Admitting: Nurse Practitioner

## 2020-11-12 DIAGNOSIS — C3490 Malignant neoplasm of unspecified part of unspecified bronchus or lung: Secondary | ICD-10-CM

## 2020-11-16 ENCOUNTER — Telehealth: Payer: Self-pay | Admitting: *Deleted

## 2020-11-16 NOTE — Telephone Encounter (Signed)
Left patient voice mail that Dr. [name] wants him to have CT chest prior to his next visit on 02/08/21. Requested he call if he has not heard from radiology by 1st week in June.

## 2021-02-04 ENCOUNTER — Ambulatory Visit (HOSPITAL_BASED_OUTPATIENT_CLINIC_OR_DEPARTMENT_OTHER)
Admission: RE | Admit: 2021-02-04 | Discharge: 2021-02-04 | Disposition: A | Discharge status: Home / Self Care | Payer: Medicare Other | Source: Ambulatory Visit | Attending: Nurse Practitioner | Admitting: Nurse Practitioner

## 2021-02-04 ENCOUNTER — Other Ambulatory Visit: Payer: Self-pay

## 2021-02-04 DIAGNOSIS — C3490 Malignant neoplasm of unspecified part of unspecified bronchus or lung: Secondary | ICD-10-CM | POA: Diagnosis not present

## 2021-02-04 DIAGNOSIS — R911 Solitary pulmonary nodule: Secondary | ICD-10-CM | POA: Diagnosis not present

## 2021-02-05 DIAGNOSIS — H2513 Age-related nuclear cataract, bilateral: Secondary | ICD-10-CM | POA: Diagnosis not present

## 2021-02-05 DIAGNOSIS — H43813 Vitreous degeneration, bilateral: Secondary | ICD-10-CM | POA: Diagnosis not present

## 2021-02-05 DIAGNOSIS — H524 Presbyopia: Secondary | ICD-10-CM | POA: Diagnosis not present

## 2021-02-05 DIAGNOSIS — H33301 Unspecified retinal break, right eye: Secondary | ICD-10-CM | POA: Diagnosis not present

## 2021-02-09 ENCOUNTER — Inpatient Hospital Stay: Payer: Medicare Other | Attending: Oncology | Admitting: Oncology

## 2021-02-09 ENCOUNTER — Other Ambulatory Visit: Payer: Self-pay

## 2021-02-09 VITALS — BP 132/83 | HR 62 | Temp 97.8°F | Resp 18 | Ht 73.0 in | Wt 148.2 lb

## 2021-02-09 DIAGNOSIS — C3490 Malignant neoplasm of unspecified part of unspecified bronchus or lung: Secondary | ICD-10-CM

## 2021-02-09 DIAGNOSIS — C3411 Malignant neoplasm of upper lobe, right bronchus or lung: Secondary | ICD-10-CM | POA: Insufficient documentation

## 2021-02-09 DIAGNOSIS — Z87891 Personal history of nicotine dependence: Secondary | ICD-10-CM | POA: Insufficient documentation

## 2021-02-09 DIAGNOSIS — J90 Pleural effusion, not elsewhere classified: Secondary | ICD-10-CM | POA: Insufficient documentation

## 2021-02-09 DIAGNOSIS — N289 Disorder of kidney and ureter, unspecified: Secondary | ICD-10-CM | POA: Diagnosis not present

## 2021-02-09 DIAGNOSIS — R0600 Dyspnea, unspecified: Secondary | ICD-10-CM | POA: Insufficient documentation

## 2021-02-09 DIAGNOSIS — Z7901 Long term (current) use of anticoagulants: Secondary | ICD-10-CM | POA: Insufficient documentation

## 2021-02-09 DIAGNOSIS — Z87442 Personal history of urinary calculi: Secondary | ICD-10-CM | POA: Diagnosis not present

## 2021-02-09 DIAGNOSIS — D649 Anemia, unspecified: Secondary | ICD-10-CM | POA: Diagnosis not present

## 2021-02-09 NOTE — Progress Notes (Signed)
[facility] OFFICE PROGRESS NOTE   Diagnosis: Non-small cell lung cancer  INTERVAL HISTORY:   Mr. Antrim returns as scheduled.  No dyspnea.  He is exercising.  Good appetite.  He reports "allergy "symptoms in the eyes.  He has been evaluated by ophthalmology.  The symptoms have not improved with Zyrtec. He had increased "gas "and diarrhea.  He discontinued aspirin and the GI symptoms resolved.  Objective:  Vital signs in last 24 hours:  Blood pressure 132/83, pulse 62, temperature 97.8 F (36.6 C), temperature source Oral, resp. rate 18, height _0  (1.854 m), weight 148 lb 3.2 oz (67.2 kg), SpO2 97 %.    HEENT: Neck without mass Lymphatics: No cervical, supraclavicular, or inguinal nodes.  "Shotty "left greater than right axillary nodes Resp: Decreased breath sounds of the right lower chest, no respiratory distress Cardio: Irregular GI: No hepatosplenomegaly Vascular: No leg edema   Lab Results:  Lab Results  Component Value Date   WBC 8.7 09/14/2020   HGB 12.2 (L) 09/14/2020   HCT 38.6 (L) 09/14/2020   MCV 91.5 09/14/2020   PLT 258 09/14/2020   NEUTROABS 5.7 09/14/2020    CMP  Lab Results  Component Value Date   NA 140 09/14/2020   K 4.3 09/14/2020   CL 104 09/14/2020   CO2 27 09/14/2020   GLUCOSE 106 (H) 09/14/2020   BUN 32 (H) 09/14/2020   CREATININE 1.61 (H) 09/14/2020   CALCIUM 9.7 09/14/2020   PROT 8.3 (H) 09/14/2020   ALBUMIN 3.5 09/14/2020   AST 19 09/14/2020   ALT 11 09/14/2020   ALKPHOS 106 09/14/2020   BILITOT 0.4 09/14/2020   GFRNONAA 45 (L) 09/14/2020   GFRAA 46 (L) 05/08/2020     Medications: I have reviewed the patient's current medications.   Assessment/Plan: Metastatic non-small cell lung cancer  large right pleural effusion, right lung mass, right pleural-based masses, left lung nodules Right thoracentesis 09/04/2018-negative cytology CT biopsy of right lateral pleural mass 09/07/2018- poorly differentiated  non-small cell carcinoma, malignant cells positive for cytokeratin AE1/AE3 and cytokeratin 8/18, histology favoring adenocarcinoma, MSS, tumor mutation burden 5.no EGFR, ALK, BRAF alteration.  PDL 1 rearrangement PDL 1 tumor proportion score-100%  Cycle 1 Alimta/carboplatin 09/22/2018, pembrolizumab 09/28/2018 Cycle 2 Alimta/carboplatin/pembrolizumab 10/15/2018 Cycle 3 Alimta/carboplatin/pembrolizumab 11/06/2018 Cycle 4 Alimta/carboplatin/pembrolizumab 11/28/2018 CT chest 12/14/2018- response to therapy of lung primary/primaries, thoracic nodal and pulmonary metastasis.  Decrease in right pleural effusion with improvement in pleural-based metastasis.  New T10 heterogeneous sclerotic density likely due to healing of previously CT occult metastasis. Pembrolizumab 12/19/2018 Pembrolizumab/Alimta starting 01/09/2019 Pembrolizumab/Alimta 01/31/2019 Pembrolizumab/Alimta 02/21/2019 Pembrolizumab/Alimta 03/14/2019 CT 04/01/2019- no evidence of disease progression, small loculated right pleural effusion and right basilar pleural nodularity-stable, stable left lower lobe nodule, persistent sclerosis at T10 Pembrolizumab/Alimta 04/05/2019 Pembrolizumab 05/07/2019 Pembrolizumab 05/28/2019 Pembrolizumab 06/18/2019 Pembrolizumab 07/09/2019 Chest CT 07/26/2019-moderate to large stable exudative right effusion with considerable pleural thickening.  Nodularity at the right lung base along pleural thickening similar to prior.  Stable left paramediastinal groundglass density nodule.  Stable sclerotic lesion posteriorly at the T10 vertebral level. Pembrolizumab 07/30/2019 Pembrolizumab 08/21/2019-changed to every 6-week dosing Pembrolizumab 10/11/2019 Pembrolizumab 11/22/2019 CT chest 01/03/2020-chronic appearing loculated effusion right chest with signs of pleural thickening smaller than on prior exam; similar appearance of tree-in-bud bud nodularity in the right upper lobe and left lung apex along with bronchiectatic changes in the  lingula and middle lobe; similar appearance of sclerosis T10 vertebral body Pembrolizumab 01/03/2020 Pembrolizumab 02/14/2020 Pembrolizumab 03/27/2020 Chest CT 05/06/2020-unchanged posttreatment  appearance of the chest with a chronic, loculated moderate right pleural effusion with associated atelectasis or consolidation and pleural thickening.  Stable small pulmonary nodules measuring 3 mm or smaller.  Pembrolizumab 05/08/2020 Pembrolizumab 06/19/2020 Pembrolizumab 08/03/2020 Pembrolizumab 09/14/2020 CT chest 11/04/2020-persistent and enlarging complex loculated right pleural fluid collection.  No findings suspicious for recurrent tumor, mediastinal/hilar adenopathy or pulmonary metastatic disease. CT chest 02/04/2021-chronic loculated right pleural effusion, new ill-defined right upper lobe nodule-nonspecific, coronary and aortic valve calcifications     2.  Severe anemia-likely secondary to bleeding in the right pleural space and metastatic carcinoma, red cell transfusions 09/09/2018 09/10/2018, 09/23/2018-improved; progressive 04/26/2019, red cell transfusion.  Improved. 3.  Dyspnea secondary to the large right pleural effusion and anemia Right Pleurx catheter placed 09/11/2018 Chest x-ray 10/09/2018- large right pleural effusion, loculated Chest x-ray 11/06/2018- no interval change in the right-sided pleural effusion Pleurx drainage catheter removed 11/13/2018 4.  History of tobacco use 5.  History of kidney stones 6.  Fever-most likely tumor fever, improved 7.  Admission 10/08/2018 with rapid atrial fibrillation/flutter- improved with diltiazem, started on Eliquis anticoagulation.  Eliquis discontinued.  Now on aspirin. 8.  Renal insufficiency    Disposition: Mr. Brucato remains in clinical remission from lung cancer.  I reviewed the CT images with him.  The right pleural effusion appears stable.  The right upper lobe nodule has an inflammatory appearance.  The plan is to continue observation.  He will  return for an office visit in 3 months.  We will plan for a restaging chest CT in 6 months.  I recommended he follow-up with Dr. Dorthy Cooler to evaluate the eye symptoms.  I recommended he resume aspirin and follow-up with cardiology for the arrhythmia and coronary/aortic valve calcifications.  Betsy Coder, MD  02/09/2021  1:55 PM

## 2021-02-11 DIAGNOSIS — H33311 Horseshoe tear of retina without detachment, right eye: Secondary | ICD-10-CM | POA: Diagnosis not present

## 2021-02-11 DIAGNOSIS — H35373 Puckering of macula, bilateral: Secondary | ICD-10-CM | POA: Diagnosis not present

## 2021-02-11 DIAGNOSIS — H25813 Combined forms of age-related cataract, bilateral: Secondary | ICD-10-CM | POA: Diagnosis not present

## 2021-02-11 DIAGNOSIS — H4423 Degenerative myopia, bilateral: Secondary | ICD-10-CM | POA: Diagnosis not present

## 2021-02-11 DIAGNOSIS — H35433 Paving stone degeneration of retina, bilateral: Secondary | ICD-10-CM | POA: Diagnosis not present

## 2021-02-14 DIAGNOSIS — Z20822 Contact with and (suspected) exposure to covid-19: Secondary | ICD-10-CM | POA: Diagnosis not present

## 2021-03-03 DIAGNOSIS — H33311 Horseshoe tear of retina without detachment, right eye: Secondary | ICD-10-CM | POA: Diagnosis not present

## 2021-03-17 DIAGNOSIS — Z20822 Contact with and (suspected) exposure to covid-19: Secondary | ICD-10-CM | POA: Diagnosis not present

## 2021-05-11 ENCOUNTER — Inpatient Hospital Stay: Payer: Medicare Other | Attending: Nurse Practitioner | Admitting: Nurse Practitioner

## 2021-05-11 ENCOUNTER — Encounter: Payer: Self-pay | Admitting: Nurse Practitioner

## 2021-05-11 ENCOUNTER — Ambulatory Visit (HOSPITAL_BASED_OUTPATIENT_CLINIC_OR_DEPARTMENT_OTHER)
Admission: RE | Admit: 2021-05-11 | Discharge: 2021-05-11 | Disposition: A | Discharge status: Home / Self Care | Payer: Medicare Other | Source: Ambulatory Visit | Attending: Nurse Practitioner | Admitting: Nurse Practitioner

## 2021-05-11 ENCOUNTER — Other Ambulatory Visit: Payer: Self-pay

## 2021-05-11 ENCOUNTER — Inpatient Hospital Stay: Payer: Medicare Other

## 2021-05-11 VITALS — BP 141/79 | HR 70 | Temp 97.9°F | Resp 18 | Ht 73.0 in | Wt 146.0 lb

## 2021-05-11 DIAGNOSIS — C3490 Malignant neoplasm of unspecified part of unspecified bronchus or lung: Secondary | ICD-10-CM

## 2021-05-11 DIAGNOSIS — J910 Malignant pleural effusion: Secondary | ICD-10-CM | POA: Insufficient documentation

## 2021-05-11 DIAGNOSIS — J90 Pleural effusion, not elsewhere classified: Secondary | ICD-10-CM | POA: Insufficient documentation

## 2021-05-11 DIAGNOSIS — R0602 Shortness of breath: Secondary | ICD-10-CM | POA: Insufficient documentation

## 2021-05-11 LAB — CMP (CANCER CENTER ONLY)
ALT: 11 U/L (ref 0–44)
AST: 17 U/L (ref 15–41)
Albumin: 3.8 g/dL (ref 3.5–5.0)
Alkaline Phosphatase: 85 U/L (ref 38–126)
Anion gap: 11 (ref 5–15)
BUN: 25 mg/dL — ABNORMAL HIGH (ref 8–23)
CO2: 26 mmol/L (ref 22–32)
Calcium: 9.8 mg/dL (ref 8.9–10.3)
Chloride: 100 mmol/L (ref 98–111)
Creatinine: 1.41 mg/dL — ABNORMAL HIGH (ref 0.61–1.24)
GFR, Estimated: 52 mL/min — ABNORMAL LOW (ref >60)
Glucose, Bld: 107 mg/dL — ABNORMAL HIGH (ref 70–99)
Potassium: 4.4 mmol/L (ref 3.5–5.1)
Sodium: 137 mmol/L (ref 135–145)
Total Bilirubin: 0.4 mg/dL (ref 0.3–1.2)
Total Protein: 7.9 g/dL (ref 6.5–8.1)

## 2021-05-11 LAB — CBC WITH DIFFERENTIAL (CANCER CENTER ONLY)
Abs Immature Granulocytes: 0.06 10*3/uL (ref 0.00–0.07)
Basophils Absolute: 0 10*3/uL (ref 0.0–0.1)
Basophils Relative: 0 %
Eosinophils Absolute: 0.3 10*3/uL (ref 0.0–0.5)
Eosinophils Relative: 3 %
HCT: 36.2 % — ABNORMAL LOW (ref 39.0–52.0)
Hemoglobin: 11.4 g/dL — ABNORMAL LOW (ref 13.0–17.0)
Immature Granulocytes: 1 %
Lymphocytes Relative: 11 %
Lymphs Abs: 1.1 10*3/uL (ref 0.7–4.0)
MCH: 27.5 pg (ref 26.0–34.0)
MCHC: 31.5 g/dL (ref 30.0–36.0)
MCV: 87.4 fL (ref 80.0–100.0)
Monocytes Absolute: 1.2 10*3/uL — ABNORMAL HIGH (ref 0.1–1.0)
Monocytes Relative: 12 %
Neutro Abs: 7.3 10*3/uL (ref 1.7–7.7)
Neutrophils Relative %: 73 %
Platelet Count: 377 10*3/uL (ref 150–400)
RBC: 4.14 MIL/uL — ABNORMAL LOW (ref 4.22–5.81)
RDW: 13.1 % (ref 11.5–15.5)
WBC Count: 9.9 10*3/uL (ref 4.0–10.5)
nRBC: 0 % (ref 0.0–0.2)

## 2021-05-11 NOTE — Progress Notes (Signed)
[facility] OFFICE PROGRESS NOTE   Diagnosis: Non-small cell lung cancer  INTERVAL HISTORY:   [name] returns as scheduled.  He continues to note increased mucus in his throat.  This has been ongoing for a few years.  Over the past 2 weeks he notes increased dyspnea on exertion.  Periodic dry cough.  No fever.  Objective:  Vital signs in last 24 hours:  Blood pressure (!) 141/79, pulse 70, temperature 97.9 F (36.6 C), temperature source Oral, resp. rate 18, height _0  (1.854 m), weight 146 lb (66.2 kg), SpO2 95 %.    HEENT: No thrush or ulcers. Lymphatics: No palpable cervical or supraclavicular lymph nodes. Resp: Diminished breath sounds right mid to lower lung field.  No respiratory distress. Cardio: Irregular. GI: No hepatosplenomegaly. Vascular: No leg edema.   Lab Results:  Lab Results  Component Value Date   WBC 9.9 05/11/2021   HGB 11.4 (L) 05/11/2021   HCT 36.2 (L) 05/11/2021   MCV 87.4 05/11/2021   PLT 377 05/11/2021   NEUTROABS 7.3 05/11/2021    Imaging:  No results found.  Medications: I have reviewed the patient's current medications.  Assessment/Plan: Metastatic non-small cell lung cancer  large right pleural effusion, right lung mass, right pleural-based masses, left lung nodules Right thoracentesis 09/04/2018-negative cytology CT biopsy of right lateral pleural mass 09/07/2018- poorly differentiated non-small cell carcinoma, malignant cells positive for cytokeratin AE1/AE3 and cytokeratin 8/18, histology favoring adenocarcinoma, MSS, tumor mutation burden 5.no EGFR, ALK, BRAF alteration.  PDL 1 rearrangement PDL 1 tumor proportion score-100%  Cycle 1 Alimta/carboplatin 09/22/2018, pembrolizumab 09/28/2018 Cycle 2 Alimta/carboplatin/pembrolizumab 10/15/2018 Cycle 3 Alimta/carboplatin/pembrolizumab 11/06/2018 Cycle 4 Alimta/carboplatin/pembrolizumab 11/28/2018 CT chest 12/14/2018- response to therapy of lung primary/primaries, thoracic  nodal and pulmonary metastasis.  Decrease in right pleural effusion with improvement in pleural-based metastasis.  New T10 heterogeneous sclerotic density likely due to healing of previously CT occult metastasis. Pembrolizumab 12/19/2018 Pembrolizumab/Alimta starting 01/09/2019 Pembrolizumab/Alimta 01/31/2019 Pembrolizumab/Alimta 02/21/2019 Pembrolizumab/Alimta 03/14/2019 CT 04/01/2019- no evidence of disease progression, small loculated right pleural effusion and right basilar pleural nodularity-stable, stable left lower lobe nodule, persistent sclerosis at T10 Pembrolizumab/Alimta 04/05/2019 Pembrolizumab 05/07/2019 Pembrolizumab 05/28/2019 Pembrolizumab 06/18/2019 Pembrolizumab 07/09/2019 Chest CT 07/26/2019-moderate to large stable exudative right effusion with considerable pleural thickening.  Nodularity at the right lung base along pleural thickening similar to prior.  Stable left paramediastinal groundglass density nodule.  Stable sclerotic lesion posteriorly at the T10 vertebral level. Pembrolizumab 07/30/2019 Pembrolizumab 08/21/2019-changed to every 6-week dosing Pembrolizumab 10/11/2019 Pembrolizumab 11/22/2019 CT chest 01/03/2020-chronic appearing loculated effusion right chest with signs of pleural thickening smaller than on prior exam; similar appearance of tree-in-bud bud nodularity in the right upper lobe and left lung apex along with bronchiectatic changes in the lingula and middle lobe; similar appearance of sclerosis T10 vertebral body Pembrolizumab 01/03/2020 Pembrolizumab 02/14/2020 Pembrolizumab 03/27/2020 Chest CT 05/06/2020-unchanged posttreatment appearance of the chest with a chronic, loculated moderate right pleural effusion with associated atelectasis or consolidation and pleural thickening.  Stable small pulmonary nodules measuring 3 mm or smaller.  Pembrolizumab 05/08/2020 Pembrolizumab 06/19/2020 Pembrolizumab 08/03/2020 Pembrolizumab 09/14/2020 CT chest 11/04/2020-persistent and  enlarging complex loculated right pleural fluid collection.  No findings suspicious for recurrent tumor, mediastinal/hilar adenopathy or pulmonary metastatic disease. CT chest 02/04/2021-chronic loculated right pleural effusion, new ill-defined right upper lobe nodule-nonspecific, coronary and aortic valve calcifications  (next scan planned at a 4-monthinterval)     2.  Severe anemia-likely secondary to bleeding in the right pleural space and metastatic carcinoma,  red cell transfusions 09/09/2018 09/10/2018, 09/23/2018-improved; progressive 04/26/2019, red cell transfusion.  Improved. 3.  Dyspnea secondary to the large right pleural effusion and anemia Right Pleurx catheter placed 09/11/2018 Chest x-ray 10/09/2018- large right pleural effusion, loculated Chest x-ray 11/06/2018- no interval change in the right-sided pleural effusion Pleurx drainage catheter removed 11/13/2018 4.  History of tobacco use 5.  History of kidney stones 6.  Fever-most likely tumor fever, improved 7.  Admission 10/08/2018 with rapid atrial fibrillation/flutter- improved with diltiazem, started on Eliquis anticoagulation.  Eliquis discontinued.  Now on aspirin. 8.  Renal insufficiency      Disposition: [name] appears unchanged.  He has been experiencing increased dyspnea on exertion for the past few weeks.  We decided to obtain a chest x-ray today.  We will contact him with the result.  We reviewed the CBC from today.  Hemoglobin is stable.  He will return for follow-up in 4 weeks.  He understands to contact the office for a sooner visit if the dyspnea worsens or he develops other concerning symptoms.    Juron Vorhees ANP/GNP-BC   05/11/2021  2:30 PM

## 2021-05-13 ENCOUNTER — Telehealth: Payer: Self-pay

## 2021-05-13 ENCOUNTER — Other Ambulatory Visit: Payer: Self-pay | Admitting: Nurse Practitioner

## 2021-05-13 DIAGNOSIS — C3490 Malignant neoplasm of unspecified part of unspecified bronchus or lung: Secondary | ICD-10-CM

## 2021-05-13 NOTE — Telephone Encounter (Signed)
-----   Message from Owens Shark, NP sent at 05/13/2021  8:03 AM EDT ----- Please let him know chest xray shows changes in the right lung. Referring for chest CT. Thanks

## 2021-05-13 NOTE — Telephone Encounter (Signed)
Called pt aware of CXR findings and CT referral once scheduled pt will be contacted/updates with appt

## 2021-05-21 ENCOUNTER — Ambulatory Visit (HOSPITAL_BASED_OUTPATIENT_CLINIC_OR_DEPARTMENT_OTHER)
Admission: RE | Admit: 2021-05-21 | Discharge: 2021-05-21 | Disposition: A | Discharge status: Home / Self Care | Payer: Medicare Other | Source: Ambulatory Visit | Attending: Nurse Practitioner | Admitting: Nurse Practitioner

## 2021-05-21 ENCOUNTER — Other Ambulatory Visit: Payer: Self-pay

## 2021-05-21 DIAGNOSIS — R0600 Dyspnea, unspecified: Secondary | ICD-10-CM | POA: Diagnosis not present

## 2021-05-21 DIAGNOSIS — C3490 Malignant neoplasm of unspecified part of unspecified bronchus or lung: Secondary | ICD-10-CM | POA: Insufficient documentation

## 2021-05-21 DIAGNOSIS — R911 Solitary pulmonary nodule: Secondary | ICD-10-CM | POA: Diagnosis not present

## 2021-05-21 DIAGNOSIS — J9811 Atelectasis: Secondary | ICD-10-CM | POA: Diagnosis not present

## 2021-05-21 DIAGNOSIS — J479 Bronchiectasis, uncomplicated: Secondary | ICD-10-CM | POA: Diagnosis not present

## 2021-05-26 ENCOUNTER — Telehealth: Payer: Self-pay | Admitting: Nurse Practitioner

## 2021-05-26 DIAGNOSIS — C3490 Malignant neoplasm of unspecified part of unspecified bronchus or lung: Secondary | ICD-10-CM

## 2021-05-26 MED ORDER — LEVOFLOXACIN 250 MG PO TABS: ORAL_TABLET | ORAL | 0 refills | Status: DC

## 2021-05-26 NOTE — Telephone Encounter (Signed)
I spoke with Brian Ramirez regarding CT findings.  He has stable dyspnea, slight cough.  No fever.  He does note increased "mucus".  He will begin Levaquin and follow-up as scheduled.  He understands to contact the office if symptoms worsen.

## 2021-05-26 NOTE — Telephone Encounter (Signed)
Left message for him to please contact the office regarding recent CT scan results.  Prescription being sent to his pharmacy for [place].

## 2021-06-01 ENCOUNTER — Other Ambulatory Visit: Payer: Self-pay

## 2021-06-01 ENCOUNTER — Ambulatory Visit (INDEPENDENT_AMBULATORY_CARE_PROVIDER_SITE_OTHER): Payer: Medicare Other | Admitting: Cardiovascular Disease

## 2021-06-01 ENCOUNTER — Encounter (HOSPITAL_BASED_OUTPATIENT_CLINIC_OR_DEPARTMENT_OTHER): Payer: Self-pay | Admitting: Cardiovascular Disease

## 2021-06-01 DIAGNOSIS — I10 Essential (primary) hypertension: Secondary | ICD-10-CM | POA: Insufficient documentation

## 2021-06-01 DIAGNOSIS — J90 Pleural effusion, not elsewhere classified: Secondary | ICD-10-CM | POA: Diagnosis not present

## 2021-06-01 DIAGNOSIS — R0609 Other forms of dyspnea: Secondary | ICD-10-CM

## 2021-06-01 DIAGNOSIS — I2510 Atherosclerotic heart disease of native coronary artery without angina pectoris: Secondary | ICD-10-CM

## 2021-06-01 DIAGNOSIS — I4892 Unspecified atrial flutter: Secondary | ICD-10-CM | POA: Diagnosis not present

## 2021-06-01 HISTORY — DX: Essential (primary) hypertension: I10

## 2021-06-01 HISTORY — DX: Atherosclerotic heart disease of native coronary artery without angina pectoris: I25.10

## 2021-06-01 HISTORY — DX: Other forms of dyspnea: R06.09

## 2021-06-01 NOTE — Assessment & Plan Note (Signed)
Blood pressure today is stable.  He notes that at home it has been in the 110s to 130.  He also notes that he has been bradycardic and feels better when he does not take his diltiazem.  He is interested in knowing if he will need it long-term.  I suspect that he will need something for his blood pressure, but he is going to hold it for a month and track his blood pressures daily.  He will bring this to follow-up to make decisions about what antihypertensive he should be on, if any.

## 2021-06-01 NOTE — Patient Instructions (Signed)
Medication Instructions:  STOP DILTIAZEM   *If you need a refill on your cardiac medications before your next appointment, please call your pharmacy*  Lab Work: NONE  Testing/Procedures: Your physician has requested that you have a lexiscan myoview. For further information please visit [email or web address]. Please follow instruction sheet, as given.   Follow-Up: At [facility], you and your health needs are our priority.  As part of our continuing mission to provide you with exceptional heart care, we have created designated Provider Care Teams.  These Care Teams include your primary Cardiologist (physician) and Advanced Practice Providers (APPs -  Physician Assistants and Nurse Practitioners) who all work together to provide you with the care you need, when you need it.  We recommend signing up for the patient portal called "MyChart".  Sign up information is provided on this After Visit Summary.  MyChart is used to connect with patients for Virtual Visits (Telemedicine).  Patients are able to view lab/test results, encounter notes, upcoming appointments, etc.  Non-urgent messages can be sent to your provider as well.   To learn more about what you can do with MyChart, go to [email or web address].    Your next appointment:   6 month(s)  The format for your next appointment:   In Person  Provider:   Skeet Latch, MD  07/05/2021 AT 1:30 WITH Overton Mam NP   Other Instructions  MONITOR AND LOG YOUR BLOOD PRESSURES DAILY AT HOME BRING YOUR READINGS AND MACHINE TO FOLLOW UP IN 1 MONTH

## 2021-06-01 NOTE — Assessment & Plan Note (Addendum)
Noted on chest CT.  He will come for [place] and fasting lipids/CMP.  Continue aspirin.

## 2021-06-01 NOTE — Assessment & Plan Note (Signed)
Stable

## 2021-06-01 NOTE — Assessment & Plan Note (Signed)
Brian Ramirez reports exertional dyspnea that is worse in the last month.  He is currently being treated for pneumonia but his symptoms are persistent and started before that treatment.  He has known coronary calcification as seen on repeated chest imaging.  We will get a Lexiscan Myoview to assess for ischemia.  He will also come back for fasting lipids and a CMP.  LDL goal is less than 70.

## 2021-06-01 NOTE — Assessment & Plan Note (Signed)
No recurrent episodes of atrial fibrillation/flutter since the initial episode in 2020.  He has never been on anticoagulation and is not interested in starting.  Continue aspirin.

## 2021-06-01 NOTE — Progress Notes (Signed)
Cardiology Office Note:    Date:  06/01/2021   ID:  Brian Ramirez, DOB 1946/04/29, MRN [id number]  PCP:  [name], MD   [place] [facility] Cardiologist:  [name], MD (Inactive)     Referring MD: [name], MD   No chief complaint on file.   History of Present Illness:    Brian Ramirez is a 75 y.o. male with a hx of metastatic non-small cell lung cancer, left inguinal hernia s/p pleurex catheter and atrial flutter here for follow-up. He first developed atrial flutter with RVR with blood transfusion on 09/2018. His echo showed LVEF 50-55% with moderate dilated L atrium. He was started on diltiazem and ondansetron. He last saw Dr Meda Coffee 05/2020 and was doing well. He continues to follow up with oncology regularly. His last chest CT on 01/2021 showed chronic loculated  R pleural infusion and new R upper lobe nodule. He had R and coronary valve calcification. He had another chest CT 05/2021 which was multi lobar pneumonia or pulmonary hemorrhage . He had a new lower L lobe nodule and a stable pleural effusion. He was treated with levofloxacin.  Today, he is doing good overall. He has not had an atrial flutter episode since 2020. He monitors his heart rate and records measurements as low as 55-60 bpm.He feels better when not taking diltiazem. He records his blood pressure at home and reports averages of 761P systolic. These low measurements can occur at rest or while working out and can cause lightheadedness and dizziness. He also experiences chest pressure but he relates this to exercise instead of his heart. In 03/2021, he noticed shortness of breath walking up stairs. He has episodes of pneumonia. When he wakes up, he feels what he thinks is mucous in his throat which blocks his breathing. When he goes downstairs in the morning, he mentions feeling winded. He denies any palpitations, chest pain, headaches, syncope, orthopnea, PND, or lower extremity edema.  Past  Medical History:  Diagnosis Date   Acute congestive heart failure ([place])    Anemia of chronic disease 10/08/2018   Anticoagulated    Atrial flutter with rapid ventricular response ([place]) 10/08/2018   CAD in native artery 06/01/2021   Difficult airway for intubation 09/26/2016   Edema of both lower extremities 10/09/2018   Essential hypertension 06/01/2021   Exertional dyspnea 06/01/2021   Goals of care, counseling/discussion 09/20/2018   Hernia 2012   History of hiatal hernia    History of kidney stones    Left inguinal hernia s/p lap repair 11/22/2016 09/26/2016   Lung cancer ([place]) 09/20/2018   Lung mass 09/04/2018   Malnutrition of moderate degree 09/20/2018   Mass of right lung 09/04/2018   New onset atrial fibrillation (H[facility])    Personal history of kidney stones 1992   Pleural effusion 09/04/2018   Pleural effusion on right    Pressure injury of skin 09/20/2018   SOB (shortness of breath) 09/19/2018    Past Surgical History:  Procedure Laterality Date   HERNIA REPAIR  2012   inguinal   INGUINAL HERNIA REPAIR Left 11/22/2016   Procedure: LAPAROSCOPIC  REPAIR OF LEFT INGUINAL HERNIA WITH MESH;  Surgeon: [name], MD;  Location: [facility];  Service: General;  Laterality: Left;   IR INSTILL VIA CHEST TUBE AGENT FOR FIBRINOLYSIS INI DAY  10/10/2018   IR INSTILL VIA CHEST TUBE AGENT FOR FIBRINOLYSIS INI DAY  10/31/2018   IR PERC PLEURAL DRAIN W/INDWELL CATH W/IMG GUIDE  09/11/2018  IR REMOVAL OF PLURAL CATH W/CUFF  11/13/2018    Current Medications: Current Meds  Medication Sig   Artificial Tear Ointment (DRY EYES OP) Apply 1 drop to eye as needed. Dry, itching eyes   aspirin EC 81 MG tablet Take 1 tablet (81 mg total) by mouth daily.   levofloxacin (LEVAQUIN) 250 MG tablet Take 500 mg day 1 then 250 mg daily for 9 days beginning on day 2   [DISCONTINUED] diltiazem (CARDIZEM CD) 120 MG 24 hr capsule TAKE ONE CAPSULE BY MOUTH DAILY     Allergies:   Patient has no known allergies.   Social  History   Socioeconomic History   Marital status: Single    Spouse name: Not on file   Number of children: Not on file   Years of education: Not on file   Highest education level: Not on file  Occupational History   Not on file  Tobacco Use   Smoking status: Never   Smokeless tobacco: Never  Substance and Sexual Activity   Alcohol use: Yes    Alcohol/week: 1.0 standard drink    Types: 1 Glasses of wine per week   Drug use: No   Sexual activity: Not on file  Other Topics Concern   Not on file  Social History Narrative   Not on file   Social Determinants of Health   Financial Resource Strain: Not on file  Food Insecurity: Not on file  Transportation Needs: Not on file  Physical Activity: Not on file  Stress: Not on file  Social Connections: Not on file     Family History: The patient's family history includes Cancer in his brother; Heart disease in his father.  ROS:   Please see the history of present illness.    (+) Shortness of breath (+) Lightheadedness (+) Dizziness All other systems reviewed and are negative.  EKGs/Labs/Other Studies Reviewed:    The following studies were reviewed today: CT Chest 05/21/2021 1. Substantial increased peripheral airspace opacity in the right upper lobe and superior segment right lower lobe. Assuming no interval radiation therapy (I did not find records of such in [facility]) the appearance is suspicious for multilobar pneumonia, or less likely pulmonary hemorrhage in the setting of otherwise occult pulmonary embolus. 2. Stable thick-walled chronic large right pleural fluid collection. 3. New part solid 1.2 cm superior segment left lower lobe nodule. This could be inflammatory or malignant. 4. Stable left upper lobe ground-glass density pulmonary nodule, low-grade adenocarcinoma not excluded, surveillance recommended. 5. Increased bandlike in nodular opacity laterally in the left lower lobe, surveillance suggested. 6.  Aortic Atherosclerosis (ICD10-I70.0). Coronary atherosclerosis. Aortic valve calcification.  CT Chest 02/04/2021 1. Chronic loculated large right pleural effusion with areas of chronic atelectasis in the right lung base, similar to prior studies. 2. New somewhat ill-defined right upper lobe pulmonary nodule. This is nonspecific and could be of infectious or inflammatory etiology, however, close attention on routine follow-up imaging is recommended to ensure the stability or regression of this lesion. 3. Aortic atherosclerosis, in addition to left main and left anterior descending coronary artery disease. Assessment for potential risk factor modification, dietary therapy or pharmacologic therapy may be warranted, if clinically indicated. 4. There are calcifications of the aortic valve. Echocardiographic correlation for evaluation of potential valvular dysfunction may be warranted if clinically indicated. 5. Mild diffuse bronchial wall thickening with mild centrilobular and paraseptal emphysema.  Echo 03/11/2019 1. The left ventricle has low normal systolic function, with an ejection  fraction  of 50-55%. The cavity size was normal. There is moderately  increased left ventricular wall thickness. Left ventricular diastolic  Doppler parameters are consistent with  impaired relaxation.   2. The right ventricle has normal systolic function. The cavity was  normal.   3. The mitral valve is grossly normal.   4. The tricuspid valve is grossly normal.   5. The aortic valve is tricuspid. Mild calcification of the aortic valve.  No stenosis of the aortic valve.   6. The aorta is normal in size and structure.   7. EF difficult to quantitate due to frequent ectopy but appears to be  low normal; mild diastolic dysfunction; moderate LVH.   EKG:   06/01/2021: Sinus arrhythmia Rate 88 bpm L atrium enlargement   Recent Labs: 08/03/2020: TSH 3.005 05/11/2021: ALT 11; BUN 25; Creatinine 1.41;  Hemoglobin 11.4; Platelet Count 377; Potassium 4.4; Sodium 137  Recent Lipid Panel No results found for: CHOL, TRIG, HDL, CHOLHDL, VLDL, LDLCALC, LDLDIRECT   Risk Assessment/Calculations:    CHA2DS2-VASc Score = 3   This indicates a 3.2% annual risk of stroke. The patient's score is based upon: CHF History: 0 HTN History: 1 Diabetes History: 0 Stroke History: 0 Vascular Disease History: 1 Age Score: 1 Gender Score: 0         Physical Exam:   VS:  BP 132/70 (BP Location: Right Arm, Patient Position: Sitting, Cuff Size: Normal)   Pulse 88   Ht 6\' 1"  (1.854 m)   Wt 145 lb 14.4 oz (66.2 kg)   BMI 19.25 kg/m  , BMI Body mass index is 19.25 kg/m. GENERAL:  Well appearing HEENT: Pupils equal round and reactive, fundi not visualized, oral mucosa unremarkable NECK:  No jugular venous distention, waveform within normal limits, carotid upstroke brisk and symmetric, no bruits, no thyromegaly LUNGS:  Clear to auscultation bilaterally HEART:  RRR.  PMI not displaced or sustained,S1 and S2 within normal limits, no S3, no S4, no clicks, no rubs, no murmurs ABD:  Flat, positive bowel sounds normal in frequency in pitch, no bruits, no rebound, no guarding, no midline pulsatile mass, no hepatomegaly, no splenomegaly EXT:  2 plus pulses throughout, no edema, no cyanosis no clubbing SKIN:  No rashes no nodules NEURO:  Cranial nerves II through XII grossly intact, motor grossly intact throughout PSYCH:  Cognitively intact, oriented to person place and time   ASSESSMENT:    1. Atrial flutter with rapid ventricular response ([place])   2. Pleural effusion on right   3. Essential hypertension   4. Exertional dyspnea   5. CAD in native artery    PLAN:   Atrial flutter with rapid ventricular response (HCC) No recurrent episodes of atrial fibrillation/flutter since the initial episode in 2020.  He has never been on anticoagulation and is not interested in starting.  Continue aspirin.  Pleural  effusion on right Stable.  Essential hypertension Blood pressure today is stable.  He notes that at home it has been in the 110s to 130.  He also notes that he has been bradycardic and feels better when he does not take his diltiazem.  He is interested in knowing if he will need it long-term.  I suspect that he will need something for his blood pressure, but he is going to hold it for a month and track his blood pressures daily.  He will bring this to follow-up to make decisions about what antihypertensive he should be on, if any.  Exertional dyspnea Brian Ramirez  reports exertional dyspnea that is worse in the last month.  He is currently being treated for pneumonia but his symptoms are persistent and started before that treatment.  He has known coronary calcification as seen on repeated chest imaging.  We will get a Lexiscan Myoview to assess for ischemia.  He will also come back for fasting lipids and a CMP.  LDL goal is less than 70.  CAD in native artery Noted on chest CT.  He will come for [place] and fasting lipids/CMP.  Continue aspirin.    In order of problems listed above:  Shared Decision Making/Informed Consent The risks [chest pain, shortness of breath, cardiac arrhythmias, dizziness, blood pressure fluctuations, myocardial infarction, stroke/transient ischemic attack, nausea, vomiting, allergic reaction, radiation exposure, metallic taste sensation and life-threatening complications (estimated to be 1 in 10,000)], benefits (risk stratification, diagnosing coronary artery disease, treatment guidance) and alternatives of a nuclear stress test were discussed in detail with Brian Ramirez and he agrees to proceed.    Medication Adjustments/Labs and Tests Ordered: Current medicines are reviewed at length with the patient today.  Concerns regarding medicines are outlined above.  Orders Placed This Encounter  Procedures   Cardiac Stress Test: Informed Consent Details:  Physician/Practitioner Attestation; Transcribe to consent form and obtain patient signature   MYOCARDIAL PERFUSION IMAGING   EKG 12-Lead   No orders of the defined types were placed in this encounter.   Patient Instructions  Medication Instructions:  STOP DILTIAZEM   *If you need a refill on your cardiac medications before your next appointment, please call your pharmacy*  Lab Work: NONE  Testing/Procedures: Your physician has requested that you have a lexiscan myoview. For further information please visit [email or web address]. Please follow instruction sheet, as given.   Follow-Up: At [facility], you and your health needs are our priority.  As part of our continuing mission to provide you with exceptional heart care, we have created designated Provider Care Teams.  These Care Teams include your primary Cardiologist (physician) and Advanced Practice Providers (APPs -  Physician Assistants and Nurse Practitioners) who all work together to provide you with the care you need, when you need it.  We recommend signing up for the patient portal called "MyChart".  Sign up information is provided on this After Visit Summary.  MyChart is used to connect with patients for Virtual Visits (Telemedicine).  Patients are able to view lab/test results, encounter notes, upcoming appointments, etc.  Non-urgent messages can be sent to your provider as well.   To learn more about what you can do with MyChart, go to [email or web address].    Your next appointment:   6 month(s)  The format for your next appointment:   In Person  Provider:   Skeet Latch, MD  07/05/2021 AT 1:30 WITH Brian Mam NP   Other Instructions  MONITOR AND LOG YOUR BLOOD PRESSURES DAILY AT HOME BRING YOUR READINGS AND MACHINE TO FOLLOW UP IN 1 MONTH    Disposition: FU with Brian Torbert C. Oval Linsey, MD, [facility] in 6 months   I,Mykaella Javier,acting as a scribe for Brian Latch, MD.,have documented all relevant  documentation on the behalf of Brian Latch, MD,as directed by  Brian Latch, MD while in the presence of Brian Latch, MD.  I, [place] Oval Linsey, MD have reviewed all documentation for this visit.  The documentation of the exam, diagnosis, procedures, and orders on 06/01/2021 are all accurate and complete.   Signed, Brian Latch, MD  06/01/2021 7:15 PM  [facility]

## 2021-06-07 ENCOUNTER — Ambulatory Visit: Payer: Medicare Other | Admitting: Nurse Practitioner

## 2021-06-07 DIAGNOSIS — H43812 Vitreous degeneration, left eye: Secondary | ICD-10-CM | POA: Diagnosis not present

## 2021-06-07 DIAGNOSIS — H25813 Combined forms of age-related cataract, bilateral: Secondary | ICD-10-CM | POA: Diagnosis not present

## 2021-06-07 DIAGNOSIS — H31091 Other chorioretinal scars, right eye: Secondary | ICD-10-CM | POA: Diagnosis not present

## 2021-06-07 DIAGNOSIS — H33311 Horseshoe tear of retina without detachment, right eye: Secondary | ICD-10-CM | POA: Diagnosis not present

## 2021-06-07 DIAGNOSIS — H35371 Puckering of macula, right eye: Secondary | ICD-10-CM | POA: Diagnosis not present

## 2021-06-08 ENCOUNTER — Ambulatory Visit (HOSPITAL_BASED_OUTPATIENT_CLINIC_OR_DEPARTMENT_OTHER)
Admission: RE | Admit: 2021-06-08 | Discharge: 2021-06-08 | Disposition: A | Discharge status: Home / Self Care | Payer: Medicare Other | Source: Ambulatory Visit | Attending: Nurse Practitioner | Admitting: Nurse Practitioner

## 2021-06-08 ENCOUNTER — Other Ambulatory Visit: Payer: Self-pay

## 2021-06-08 ENCOUNTER — Inpatient Hospital Stay: Payer: Medicare Other | Attending: Nurse Practitioner | Admitting: Nurse Practitioner

## 2021-06-08 ENCOUNTER — Encounter: Payer: Self-pay | Admitting: Nurse Practitioner

## 2021-06-08 VITALS — BP 136/84 | HR 84 | Temp 98.7°F | Resp 18 | Ht 73.0 in | Wt 145.6 lb

## 2021-06-08 DIAGNOSIS — C3490 Malignant neoplasm of unspecified part of unspecified bronchus or lung: Secondary | ICD-10-CM

## 2021-06-08 DIAGNOSIS — J90 Pleural effusion, not elsewhere classified: Secondary | ICD-10-CM | POA: Diagnosis not present

## 2021-06-08 NOTE — Progress Notes (Signed)
[facility] OFFICE PROGRESS NOTE   Diagnosis: Non-small cell lung cancer  INTERVAL HISTORY:   Brian Ramirez returns as scheduled.  He notes no significant change in exertional dyspnea since completing the course of Levaquin.  He thinks his cough is better.  No fever.  Objective:  Vital signs in last 24 hours:  Blood pressure 136/84, pulse 84, temperature 98.7 F (37.1 C), temperature source Oral, resp. rate 18, height '6\' 1"'  (1.854 m), weight 145 lb 9.6 oz (66 kg), SpO2 95 %.    Lymphatics: No palpable cervical or supraclavicular lymph nodes. Resp: Diminished breath sounds right mid to lower lung field.  No respiratory distress. Cardio: Irregular.  Neck veins mildly distended. GI: No hepatosplenomegaly. Vascular: No leg edema.   Lab Results:  Lab Results  Component Value Date   WBC 9.9 05/11/2021   HGB 11.4 (L) 05/11/2021   HCT 36.2 (L) 05/11/2021   MCV 87.4 05/11/2021   PLT 377 05/11/2021   NEUTROABS 7.3 05/11/2021    Imaging:  No results found.  Medications: I have reviewed the patient's current medications.  Assessment/Plan: Metastatic non-small cell lung cancer  large right pleural effusion, right lung mass, right pleural-based masses, left lung nodules Right thoracentesis 09/04/2018-negative cytology CT biopsy of right lateral pleural mass 09/07/2018- poorly differentiated non-small cell carcinoma, malignant cells positive for cytokeratin AE1/AE3 and cytokeratin 8/18, histology favoring adenocarcinoma, MSS, tumor mutation burden 5.no EGFR, ALK, BRAF alteration.  PDL 1 rearrangement PDL 1 tumor proportion score-100%  Cycle 1 Alimta/carboplatin 09/22/2018, pembrolizumab 09/28/2018 Cycle 2 Alimta/carboplatin/pembrolizumab 10/15/2018 Cycle 3 Alimta/carboplatin/pembrolizumab 11/06/2018 Cycle 4 Alimta/carboplatin/pembrolizumab 11/28/2018 CT chest 12/14/2018- response to therapy of lung primary/primaries, thoracic nodal and pulmonary metastasis.  Decrease in  right pleural effusion with improvement in pleural-based metastasis.  New T10 heterogeneous sclerotic density likely due to healing of previously CT occult metastasis. Pembrolizumab 12/19/2018 Pembrolizumab/Alimta starting 01/09/2019 Pembrolizumab/Alimta 01/31/2019 Pembrolizumab/Alimta 02/21/2019 Pembrolizumab/Alimta 03/14/2019 CT 04/01/2019- no evidence of disease progression, small loculated right pleural effusion and right basilar pleural nodularity-stable, stable left lower lobe nodule, persistent sclerosis at T10 Pembrolizumab/Alimta 04/05/2019 Pembrolizumab 05/07/2019 Pembrolizumab 05/28/2019 Pembrolizumab 06/18/2019 Pembrolizumab 07/09/2019 Chest CT 07/26/2019-moderate to large stable exudative right effusion with considerable pleural thickening.  Nodularity at the right lung base along pleural thickening similar to prior.  Stable left paramediastinal groundglass density nodule.  Stable sclerotic lesion posteriorly at the T10 vertebral level. Pembrolizumab 07/30/2019 Pembrolizumab 08/21/2019-changed to every 6-week dosing Pembrolizumab 10/11/2019 Pembrolizumab 11/22/2019 CT chest 01/03/2020-chronic appearing loculated effusion right chest with signs of pleural thickening smaller than on prior exam; similar appearance of tree-in-bud bud nodularity in the right upper lobe and left lung apex along with bronchiectatic changes in the lingula and middle lobe; similar appearance of sclerosis T10 vertebral body Pembrolizumab 01/03/2020 Pembrolizumab 02/14/2020 Pembrolizumab 03/27/2020 Chest CT 05/06/2020-unchanged posttreatment appearance of the chest with a chronic, loculated moderate right pleural effusion with associated atelectasis or consolidation and pleural thickening.  Stable small pulmonary nodules measuring 3 mm or smaller.  Pembrolizumab 05/08/2020 Pembrolizumab 06/19/2020 Pembrolizumab 08/03/2020 Pembrolizumab 09/14/2020 CT chest 11/04/2020-persistent and enlarging complex loculated right pleural fluid  collection.  No findings suspicious for recurrent tumor, mediastinal/hilar adenopathy or pulmonary metastatic disease. CT chest 02/04/2021-chronic loculated right pleural effusion, new ill-defined right upper lobe nodule-nonspecific, coronary and aortic valve calcifications  (next scan planned at a 91-monthinterval) CT chest 05/21/2021-substantial increase peripheral airspace opacity in the right upper lobe and superior segment right lower lobe.  Appearance suspicious for multi lobar pneumonia.  Stable thick-walled chronic large right  pleural effusion.  New part solid 1.2 cm superior segment left lower lobe nodule.  Stable left upper lobe groundglass density pulmonary nodule.  Increased bandlike nodular opacity laterally in the left lower lobe. 10-day course of Levaquin     2.  Severe anemia-likely secondary to bleeding in the right pleural space and metastatic carcinoma, red cell transfusions 09/09/2018 09/10/2018, 09/23/2018-improved; progressive 04/26/2019, red cell transfusion.  Improved. 3.  Dyspnea secondary to the large right pleural effusion and anemia Right Pleurx catheter placed 09/11/2018 Chest x-ray 10/09/2018- large right pleural effusion, loculated Chest x-ray 11/06/2018- no interval change in the right-sided pleural effusion Pleurx drainage catheter removed 11/13/2018 4.  History of tobacco use 5.  History of kidney stones 6.  Fever-most likely tumor fever, improved 7.  Admission 10/08/2018 with rapid atrial fibrillation/flutter- improved with diltiazem, started on Eliquis anticoagulation.  Eliquis discontinued.  Now on aspirin. 8.  Renal insufficiency  Disposition: Brian Ramirez appears unchanged.  He completed a course of Levaquin following the recent chest CT which had findings concerning for pneumonia.  Symptoms mildly improved.  Plan for repeat chest x-ray today.  Follow-up visit in 4 weeks.  He will contact the office in the interim with any problems.  We specifically discussed fever, cough,  worsening shortness of breath.  Patient seen with Dr. [name].  CT images reviewed on the computer with Brian Ramirez.    Brian Ramirez ANP/GNP-BC   06/08/2021  1:29 PM This was a shared visit with Brian Ramirez.  Mr. Derflinger was interviewed and examined.  We reviewed recent chest x-ray and CT images.  The clinical presentation and CT from earlier this month are most consistent with a diagnosis of pneumonia.  He reports partial improvement in respiratory symptoms.  He will be referred for a repeat chest x-ray today.  The plan is to continue observation.  We will refer him for a diagnostic thoracentesis if the respiratory symptoms persist.  I was present for greater than 50% of today's visit.  I informed medical decision making.  Julieanne Manson, MD

## 2021-06-10 ENCOUNTER — Other Ambulatory Visit: Payer: Self-pay

## 2021-06-10 ENCOUNTER — Telehealth: Payer: Self-pay

## 2021-06-10 MED ORDER — DOXYCYCLINE HYCLATE 100 MG PO TABS: 100.0000 mg | ORAL_TABLET | Freq: Two times a day (BID) | ORAL | 0 refills | Status: DC

## 2021-06-10 NOTE — Telephone Encounter (Signed)
-----   Message from Brian Pier, MD sent at 06/10/2021  5:03 PM EDT ----- Please call patient, chest x-ray reveals improvement in right lung infiltrate, possible new area of pneumonia left lung, call in doxycycline 100 mg twice daily for 10 days, call for increased cough or shortness of breath

## 2021-06-10 NOTE — Telephone Encounter (Signed)
Pt. Verbalized understanding. Prescription sent to the pharmacy.

## 2021-06-11 ENCOUNTER — Telehealth (HOSPITAL_COMMUNITY): Payer: Self-pay | Admitting: *Deleted

## 2021-06-11 NOTE — Telephone Encounter (Signed)
Close encounter 

## 2021-06-15 ENCOUNTER — Ambulatory Visit (HOSPITAL_COMMUNITY)
Admission: RE | Admit: 2021-06-15 | Payer: Medicare Other | Source: Ambulatory Visit | Attending: Cardiovascular Disease | Admitting: Cardiovascular Disease

## 2021-06-21 ENCOUNTER — Encounter: Payer: Self-pay | Admitting: Cardiovascular Disease

## 2021-06-21 NOTE — Telephone Encounter (Signed)
error 

## 2021-06-23 ENCOUNTER — Telehealth (HOSPITAL_COMMUNITY): Payer: Self-pay | Admitting: *Deleted

## 2021-06-23 NOTE — Telephone Encounter (Signed)
Close encounter 

## 2021-06-25 ENCOUNTER — Ambulatory Visit (HOSPITAL_COMMUNITY)
Admission: RE | Admit: 2021-06-25 | Discharge: 2021-06-25 | Disposition: A | Discharge status: Home / Self Care | Payer: Medicare Other | Source: Ambulatory Visit | Attending: Cardiology | Admitting: Cardiology

## 2021-06-25 ENCOUNTER — Other Ambulatory Visit: Payer: Self-pay

## 2021-06-25 DIAGNOSIS — R0609 Other forms of dyspnea: Secondary | ICD-10-CM | POA: Diagnosis not present

## 2021-06-25 DIAGNOSIS — I10 Essential (primary) hypertension: Secondary | ICD-10-CM | POA: Diagnosis not present

## 2021-06-25 DIAGNOSIS — I2510 Atherosclerotic heart disease of native coronary artery without angina pectoris: Secondary | ICD-10-CM | POA: Diagnosis not present

## 2021-06-25 LAB — MYOCARDIAL PERFUSION IMAGING
Base ST Depression (mm): 0 mm
LV dias vol: 120 mL (ref 62–150)
LV sys vol: 57 mL
Nuc Stress EF: 52 %
Peak HR: 109 {beats}/min
Rest HR: 71 {beats}/min
Rest Nuclear Isotope Dose: 8.7 mCi
SDS: 4
SRS: 0
SSS: 4
ST Depression (mm): 0 mm
Stress Nuclear Isotope Dose: 27 mCi
TID: 0.91

## 2021-06-25 MED ORDER — TECHNETIUM TC 99M TETROFOSMIN IV KIT
27.0000 | PACK | Freq: Once | INTRAVENOUS | Status: AC | PRN
  Administered 2021-06-25: 27 via INTRAVENOUS
  Filled 2021-06-25: qty 27

## 2021-06-25 MED ORDER — REGADENOSON 0.4 MG/5ML IV SOLN
0.4000 mg | Freq: Once | INTRAVENOUS | Status: AC
  Administered 2021-06-25: 0.4 mg via INTRAVENOUS

## 2021-06-25 MED ORDER — TECHNETIUM TC 99M TETROFOSMIN IV KIT
8.7000 | PACK | Freq: Once | INTRAVENOUS | Status: AC | PRN
  Administered 2021-06-25: 8.7 via INTRAVENOUS
  Filled 2021-06-25: qty 9

## 2021-07-05 ENCOUNTER — Other Ambulatory Visit: Payer: Self-pay

## 2021-07-05 ENCOUNTER — Encounter (HOSPITAL_BASED_OUTPATIENT_CLINIC_OR_DEPARTMENT_OTHER): Payer: Self-pay | Admitting: Family

## 2021-07-05 ENCOUNTER — Encounter: Payer: Self-pay | Admitting: Nurse Practitioner

## 2021-07-05 ENCOUNTER — Ambulatory Visit (INDEPENDENT_AMBULATORY_CARE_PROVIDER_SITE_OTHER): Payer: Medicare Other | Admitting: Family

## 2021-07-05 ENCOUNTER — Telehealth: Payer: Self-pay | Admitting: *Deleted

## 2021-07-05 ENCOUNTER — Inpatient Hospital Stay: Payer: Medicare Other | Attending: Nurse Practitioner | Admitting: Nurse Practitioner

## 2021-07-05 ENCOUNTER — Inpatient Hospital Stay: Payer: Medicare Other

## 2021-07-05 VITALS — BP 140/70 | HR 86 | Ht 73.0 in | Wt 144.0 lb

## 2021-07-05 VITALS — BP 140/76 | HR 84 | Temp 98.1°F | Resp 18 | Ht 73.0 in | Wt 143.8 lb

## 2021-07-05 DIAGNOSIS — Z23 Encounter for immunization: Secondary | ICD-10-CM | POA: Diagnosis not present

## 2021-07-05 DIAGNOSIS — R0609 Other forms of dyspnea: Secondary | ICD-10-CM

## 2021-07-05 DIAGNOSIS — I2510 Atherosclerotic heart disease of native coronary artery without angina pectoris: Secondary | ICD-10-CM

## 2021-07-05 DIAGNOSIS — I10 Essential (primary) hypertension: Secondary | ICD-10-CM | POA: Diagnosis not present

## 2021-07-05 DIAGNOSIS — C771 Secondary and unspecified malignant neoplasm of intrathoracic lymph nodes: Secondary | ICD-10-CM | POA: Diagnosis not present

## 2021-07-05 DIAGNOSIS — C3490 Malignant neoplasm of unspecified part of unspecified bronchus or lung: Secondary | ICD-10-CM

## 2021-07-05 DIAGNOSIS — E785 Hyperlipidemia, unspecified: Secondary | ICD-10-CM | POA: Diagnosis not present

## 2021-07-05 DIAGNOSIS — C3491 Malignant neoplasm of unspecified part of right bronchus or lung: Secondary | ICD-10-CM | POA: Diagnosis not present

## 2021-07-05 LAB — CBC WITH DIFFERENTIAL (CANCER CENTER ONLY)
Abs Immature Granulocytes: 0.01 10*3/uL (ref 0.00–0.07)
Basophils Absolute: 0 10*3/uL (ref 0.0–0.1)
Basophils Relative: 0 %
Eosinophils Absolute: 0.3 10*3/uL (ref 0.0–0.5)
Eosinophils Relative: 4 %
HCT: 42.3 % (ref 39.0–52.0)
Hemoglobin: 12.9 g/dL — ABNORMAL LOW (ref 13.0–17.0)
Immature Granulocytes: 0 %
Lymphocytes Relative: 9 %
Lymphs Abs: 0.8 10*3/uL (ref 0.7–4.0)
MCH: 26.8 pg (ref 26.0–34.0)
MCHC: 30.5 g/dL (ref 30.0–36.0)
MCV: 87.8 fL (ref 80.0–100.0)
Monocytes Absolute: 0.9 10*3/uL (ref 0.1–1.0)
Monocytes Relative: 11 %
Neutro Abs: 6.3 10*3/uL (ref 1.7–7.7)
Neutrophils Relative %: 76 %
Platelet Count: 293 10*3/uL (ref 150–400)
RBC: 4.82 MIL/uL (ref 4.22–5.81)
RDW: 14.6 % (ref 11.5–15.5)
WBC Count: 8.2 10*3/uL (ref 4.0–10.5)
nRBC: 0 % (ref 0.0–0.2)

## 2021-07-05 LAB — CMP (CANCER CENTER ONLY)
ALT: 12 U/L (ref 0–44)
AST: 19 U/L (ref 15–41)
Albumin: 4.2 g/dL (ref 3.5–5.0)
Alkaline Phosphatase: 96 U/L (ref 38–126)
Anion gap: 9 (ref 5–15)
BUN: 24 mg/dL — ABNORMAL HIGH (ref 8–23)
CO2: 30 mmol/L (ref 22–32)
Calcium: 10.6 mg/dL — ABNORMAL HIGH (ref 8.9–10.3)
Chloride: 100 mmol/L (ref 98–111)
Creatinine: 1.37 mg/dL — ABNORMAL HIGH (ref 0.61–1.24)
GFR, Estimated: 54 mL/min — ABNORMAL LOW (ref >60)
Glucose, Bld: 107 mg/dL — ABNORMAL HIGH (ref 70–99)
Potassium: 4.9 mmol/L (ref 3.5–5.1)
Sodium: 139 mmol/L (ref 135–145)
Total Bilirubin: 0.5 mg/dL (ref 0.3–1.2)
Total Protein: 8.6 g/dL — ABNORMAL HIGH (ref 6.5–8.1)

## 2021-07-05 MED ORDER — INFLUENZA VAC A&B SA ADJ QUAD 0.5 ML IM PRSY
0.5000 mL | PREFILLED_SYRINGE | Freq: Once | INTRAMUSCULAR | Status: AC
  Administered 2021-07-05: 0.5 mL via INTRAMUSCULAR
  Filled 2021-07-05: qty 0.5

## 2021-07-05 NOTE — Progress Notes (Signed)
Office Visit    Patient Name: Brian Ramirez Date of Encounter: 07/05/2021  PCP:  [name], [place]  Cardiologist:  Skeet Latch, MD  Advanced Practice Provider:  No care team member to display Electrophysiologist:  None      Chief Complaint    Brian Ramirez is a 75 y.o. male with a hx of metastatic non-small cell lung cancer, left inguinal hernia s/p Pleurx catheter, atrial flutter presents today for follow up after lexiscan myoview   Past Medical History    Past Medical History:  Diagnosis Date   Acute congestive heart failure ([place])    Anemia of chronic disease 10/08/2018   Anticoagulated    Atrial flutter with rapid ventricular response ([place]) 10/08/2018   CAD in native artery 06/01/2021   Difficult airway for intubation 09/26/2016   Edema of both lower extremities 10/09/2018   Essential hypertension 06/01/2021   Exertional dyspnea 06/01/2021   Goals of care, counseling/discussion 09/20/2018   Hernia 2012   History of hiatal hernia    History of kidney stones    Left inguinal hernia s/p lap repair 11/22/2016 09/26/2016   Lung cancer ([place]) 09/20/2018   Lung mass 09/04/2018   Malnutrition of moderate degree 09/20/2018   Mass of right lung 09/04/2018   New onset atrial fibrillation ([place])    Personal history of kidney stones 1992   Pleural effusion 09/04/2018   Pleural effusion on right    Pressure injury of skin 09/20/2018   SOB (shortness of breath) 09/19/2018   Past Surgical History:  Procedure Laterality Date   HERNIA REPAIR  2012   inguinal   INGUINAL HERNIA REPAIR Left 11/22/2016   Procedure: LAPAROSCOPIC  REPAIR OF LEFT INGUINAL HERNIA WITH MESH;  Surgeon: [name], MD;  Location: [facility];  Service: General;  Laterality: Left;   IR INSTILL VIA CHEST TUBE AGENT FOR FIBRINOLYSIS INI DAY  10/10/2018   IR INSTILL VIA CHEST TUBE AGENT FOR FIBRINOLYSIS INI DAY  10/31/2018   IR PERC PLEURAL DRAIN W/INDWELL CATH W/IMG GUIDE   09/11/2018   IR REMOVAL OF PLURAL CATH W/CUFF  11/13/2018    Allergies  No Known Allergies  History of Present Illness    Brian Ramirez is a 75 y.o. male with a hx of metastatic non-small cell lung cancer, left inguinal hernia s/p Pleurx catheter, atrial flutter last seen 06/01/21 by Dr. Oval Linsey.  Initially developed atrial flutter with RVR with blood transfusion February 2020.  Echo at the time with LVEF 50 to 55% with moderately dilated left atrium.  He was started on diltiazem and Desitrend.  CT chest 01/2021 showed chronic loculated right pleural effusion and new right upper lobe nodule.  There was notation of coronary valve calcification.  He had CT chest 05/2021 with multilobar pneumonia or pulmonary hemorrhage.  There is new lower left lobe nodule and stable pleural effusion.  He was treated with levofloxacin.  He was seen 06/01/2021 by Dr. Oval Linsey.  Noted no recurrent atrial flutter since 2020.  He felt improved after discontinuation of diltiazem.  Blood pressure at home was 902I systolic on average.  Given coronary calcification on CT scan Lexiscan Myoview was recommended. Lexiscan myoview low risk with no evidence of ischemia and normal LVEF. Did note PAC and PVC during study.  Presents today for follow up. Since last seen has completed second course of abx per pulmonology. Cough resolved but still with dyspnea on exertion. No cough, wheeze, fever.  No chest pain, pressure, tightness. We reviewed lexiscan myoview. Also reviewed indication for cholesterol management given coronary artery atherosclerosis. He has not been on cholesterol medication previously.   EKGs/Labs/Other Studies Reviewed:   The following studies were reviewed today:  Lexiscan Myoview 06/25/2021  The study is normal. The study is low risk.   No ST deviation was noted. Arrhythmias during stress: frequent PACs, occasional PVCs. Arrhythmias during recovery: frequent PACs, occasional PVCs. PACs seen in couplets.    LV perfusion is normal. There is no evidence of ischemia. There is no evidence of infarction.   Left ventricular function is grossly normal. Nuclear stress EF: 52 %. The left ventricular ejection fraction is mildly decreased (45-54%). End diastolic cavity size is normal. There is paradoxical septal motion.   Prior study not available for comparison.  CT Chest 05/21/2021 1. Substantial increased peripheral airspace opacity in the right upper lobe and superior segment right lower lobe. Assuming no interval radiation therapy (I did not find records of such in [facility]) the appearance is suspicious for multilobar pneumonia, or less likely pulmonary hemorrhage in the setting of otherwise occult pulmonary embolus. 2. Stable thick-walled chronic large right pleural fluid collection. 3. New part solid 1.2 cm superior segment left lower lobe nodule. This could be inflammatory or malignant. 4. Stable left upper lobe ground-glass density pulmonary nodule, low-grade adenocarcinoma not excluded, surveillance recommended. 5. Increased bandlike in nodular opacity laterally in the left lower lobe, surveillance suggested. 6. Aortic Atherosclerosis (ICD10-I70.0). Coronary atherosclerosis. Aortic valve calcification.   CT Chest 02/04/2021 1. Chronic loculated large right pleural effusion with areas of chronic atelectasis in the right lung base, similar to prior studies. 2. New somewhat ill-defined right upper lobe pulmonary nodule. This is nonspecific and could be of infectious or inflammatory etiology, however, close attention on routine follow-up imaging is recommended to ensure the stability or regression of this lesion. 3. Aortic atherosclerosis, in addition to left main and left anterior descending coronary artery disease. Assessment for potential risk factor modification, dietary therapy or pharmacologic therapy may be warranted, if clinically indicated. 4. There are calcifications of the  aortic valve. Echocardiographic correlation for evaluation of potential valvular dysfunction may be warranted if clinically indicated. 5. Mild diffuse bronchial wall thickening with mild centrilobular and paraseptal emphysema.   Echo 03/11/2019 1. The left ventricle has low normal systolic function, with an ejection  fraction of 50-55%. The cavity size was normal. There is moderately  increased left ventricular wall thickness. Left ventricular diastolic  Doppler parameters are consistent with  impaired relaxation.   2. The right ventricle has normal systolic function. The cavity was  normal.   3. The mitral valve is grossly normal.   4. The tricuspid valve is grossly normal.   5. The aortic valve is tricuspid. Mild calcification of the aortic valve.  No stenosis of the aortic valve.   6. The aorta is normal in size and structure.   7. EF difficult to quantitate due to frequent ectopy but appears to be  low normal; mild diastolic dysfunction; moderate LVH.   EKG:  No EKG today.  Recent Labs: 08/03/2020: TSH 3.005 05/11/2021: ALT 11; BUN 25; Creatinine 1.41; Hemoglobin 11.4; Platelet Count 377; Potassium 4.4; Sodium 137  Recent Lipid Panel No results found for: CHOL, TRIG, HDL, CHOLHDL, VLDL, LDLCALC, LDLDIRECT  Home Medications   Current Meds  Medication Sig   Artificial Tear Ointment (DRY EYES OP) Apply 1 drop to eye as needed. Dry, itching eyes   aspirin  EC 81 MG tablet Take 1 tablet (81 mg total) by mouth daily.    Review of Systems      All other systems reviewed and are otherwise negative except as noted above.  Physical Exam    VS:  BP 140/70   Pulse 86   Ht 6\' 1"  (1.854 m)   Wt 144 lb (65.3 kg)   SpO2 97%   BMI 19.00 kg/m  , BMI Body mass index is 19 kg/m.  Wt Readings from Last 3 Encounters:  07/05/21 144 lb (65.3 kg)  06/25/21 145 lb (65.8 kg)  06/08/21 145 lb 9.6 oz (66 kg)     GEN: Well nourished, well developed, in no acute distress. HEENT:  normal. Neck: Supple, no JVD, carotid bruits, or masses. Cardiac: RRR, no murmurs, rubs, or gallops. No clubbing, cyanosis, edema.  Radials/PT 2+ and equal bilaterally.  Respiratory:  Respirations regular and unlabored, clear to auscultation bilaterally. On home O2.  GI: Soft, nontender, nondistended. MS: No deformity or atrophy. Skin: Warm and dry, no rash. Neuro:  Strength and sensation are intact. Psych: Normal affect.  Assessment & Plan    Atrial flutter - initial episode 2020.  No recurrent since that time.  Has not been maintained on long-term anticoagulation and prefers to avoid.Recent myoview with PAC and PVC but no atrial flutter. He denies palpitations.   HTN - Since holding Diltiazem home BP 110s-120s with only 2 episodes of >130 over the last month. Given well controlled without antihypertensive agent, will not resume at this time. If needed int he future, consider alternate to CCB such as ARB. He will continue to monitor at home and report BP consistently >130/80.  Coronary artery calcification on CT - 06/25/21 Lexiscan myoview with no ischemia. No chest pain. Continue Aspirin. Plan to add statin given LDL goal <70 not presently on therapy. Lipid panel, CMP today. Determination of statin dose pending results.   Disposition: Follow up in 6 month(s) with Skeet Latch, MD or APP.  Signed, Loel Dubonnet, NP 07/05/2021, 1:25 PM [facility]

## 2021-07-05 NOTE — Progress Notes (Signed)
Patient tolerated flu vaccine with no complaints voiced.  Site clean and dry with no bruising or swelling noted at site.  Band aid applied.  VSS during treatment and with discharge.  Pt left ambulatory with no s/s of distress noted.

## 2021-07-05 NOTE — Telephone Encounter (Signed)
Left "urgent" VM for patient that his thoracentesis is at [facility] on 1/22 at 0845/0900 in radiology. [facility] had nothing until 11/29. Also sent this via [place].

## 2021-07-05 NOTE — Patient Instructions (Addendum)
Medication Instructions:  Your Physician recommend you continue on your current medication as directed.    *If you need a refill on your cardiac medications before your next appointment, please call your pharmacy*   Lab Work: Your physician recommends that you return for lab work in: Today- CMP, Lipids   If you have labs (blood work) drawn today and your tests are completely normal, you will receive your results only by: MyChart Message (if you have MyChart) OR A paper copy in the mail If you have any lab test that is abnormal or we need to change your treatment, we will call you to review the results.   Testing/Procedures: None ordered today  Follow-Up: At [facility], you and your health needs are our priority.  As part of our continuing mission to provide you with exceptional heart care, we have created designated Provider Care Teams.  These Care Teams include your primary Cardiologist (physician) and Advanced Practice Providers (APPs -  Physician Assistants and Nurse Practitioners) who all work together to provide you with the care you need, when you need it.  We recommend signing up for the patient portal called "MyChart".  Sign up information is provided on this After Visit Summary.  MyChart is used to connect with patients for Virtual Visits (Telemedicine).  Patients are able to view lab/test results, encounter notes, upcoming appointments, etc.  Non-urgent messages can be sent to your provider as well.   To learn more about what you can do with MyChart, go to [email or web address].    Your next appointment:   As scheduled with Dr. Oval Linsey   The format for your next appointment:   In Person  Provider:   Skeet Latch, MD    Other Instructions Your recent stress test showed your heart pumping function was normal and no evidence of ischemia (restriction of blood flow). You did have early beats called PACs or PVCs which are very common and can cause your blood pressure  cuff to read "irregular". If you notice palpitations or feeling of your heart fluttering, please let us know.

## 2021-07-05 NOTE — Progress Notes (Signed)
[place] OFFICE PROGRESS NOTE   Diagnosis: Non-small cell lung cancer  INTERVAL HISTORY:   Brian Ramirez returns as scheduled.  He continues to have shortness of breath with exertion.  He feels this is worse.  He started wearing oxygen intermittently yesterday.  He continues to note excessive mucus in the morning hours.  He has a slight cough.  No fever.  He reports a good appetite.  Objective:  Vital signs in last 24 hours:  Blood pressure 140/76, pulse 84, temperature 98.1 F (36.7 C), temperature source Oral, resp. rate 18, height 6' 1" (1.854 m), weight 143 lb 12.8 oz (65.2 kg), SpO2 98 %.    Resp: Diminished breath sounds right lung field.  A few crackles left mid to lower lung field.  No respiratory distress. Cardio: Irregular. GI: No hepatosplenomegaly. Vascular: No leg edema.   Lab Results:  Lab Results  Component Value Date   WBC 9.9 05/11/2021   HGB 11.4 (L) 05/11/2021   HCT 36.2 (L) 05/11/2021   MCV 87.4 05/11/2021   PLT 377 05/11/2021   NEUTROABS 7.3 05/11/2021    Imaging:  No results found.  Medications: I have reviewed the patient's current medications.  Assessment/Plan: Metastatic non-small cell lung cancer  large right pleural effusion, right lung mass, right pleural-based masses, left lung nodules Right thoracentesis 09/04/2018-negative cytology CT biopsy of right lateral pleural mass 09/07/2018- poorly differentiated non-small cell carcinoma, malignant cells positive for cytokeratin AE1/AE3 and cytokeratin 8/18, histology favoring adenocarcinoma, MSS, tumor mutation burden 5.no EGFR, ALK, BRAF alteration.  PDL 1 rearrangement PDL 1 tumor proportion score-100%  Cycle 1 Alimta/carboplatin 09/22/2018, pembrolizumab 09/28/2018 Cycle 2 Alimta/carboplatin/pembrolizumab 10/15/2018 Cycle 3 Alimta/carboplatin/pembrolizumab 11/06/2018 Cycle 4 Alimta/carboplatin/pembrolizumab 11/28/2018 CT chest 12/14/2018- response to therapy of lung  primary/primaries, thoracic nodal and pulmonary metastasis.  Decrease in right pleural effusion with improvement in pleural-based metastasis.  New T10 heterogeneous sclerotic density likely due to healing of previously CT occult metastasis. Pembrolizumab 12/19/2018 Pembrolizumab/Alimta starting 01/09/2019 Pembrolizumab/Alimta 01/31/2019 Pembrolizumab/Alimta 02/21/2019 Pembrolizumab/Alimta 03/14/2019 CT 04/01/2019- no evidence of disease progression, small loculated right pleural effusion and right basilar pleural nodularity-stable, stable left lower lobe nodule, persistent sclerosis at T10 Pembrolizumab/Alimta 04/05/2019 Pembrolizumab 05/07/2019 Pembrolizumab 05/28/2019 Pembrolizumab 06/18/2019 Pembrolizumab 07/09/2019 Chest CT 07/26/2019-moderate to large stable exudative right effusion with considerable pleural thickening.  Nodularity at the right lung base along pleural thickening similar to prior.  Stable left paramediastinal groundglass density nodule.  Stable sclerotic lesion posteriorly at the T10 vertebral level. Pembrolizumab 07/30/2019 Pembrolizumab 08/21/2019-changed to every 6-week dosing Pembrolizumab 10/11/2019 Pembrolizumab 11/22/2019 CT chest 01/03/2020-chronic appearing loculated effusion right chest with signs of pleural thickening smaller than on prior exam; similar appearance of tree-in-bud bud nodularity in the right upper lobe and left lung apex along with bronchiectatic changes in the lingula and middle lobe; similar appearance of sclerosis T10 vertebral body Pembrolizumab 01/03/2020 Pembrolizumab 02/14/2020 Pembrolizumab 03/27/2020 Chest CT 05/06/2020-unchanged posttreatment appearance of the chest with a chronic, loculated moderate right pleural effusion with associated atelectasis or consolidation and pleural thickening.  Stable small pulmonary nodules measuring 3 mm or smaller.  Pembrolizumab 05/08/2020 Pembrolizumab 06/19/2020 Pembrolizumab 08/03/2020 Pembrolizumab 09/14/2020 CT chest  11/04/2020-persistent and enlarging complex loculated right pleural fluid collection.  No findings suspicious for recurrent tumor, mediastinal/hilar adenopathy or pulmonary metastatic disease. CT chest 02/04/2021-chronic loculated right pleural effusion, new ill-defined right upper lobe nodule-nonspecific, coronary and aortic valve calcifications  (next scan planned at a 23-monthinterval) CT chest 05/21/2021-substantial increase peripheral airspace opacity in the right upper lobe and  superior segment right lower lobe.  Appearance suspicious for multi lobar pneumonia.  Stable thick-walled chronic large right pleural effusion.  New part solid 1.2 cm superior segment left lower lobe nodule.  Stable left upper lobe groundglass density pulmonary nodule.  Increased bandlike nodular opacity laterally in the left lower lobe. 10-day course of Levaquin   Chest x-ray 06/09/2021-new patchy airspace disease left upper lobe, right upper lobe airspace disease has minimally decreased but not resolved.  Stable loculated right pleural effusion   2.  Severe anemia-likely secondary to bleeding in the right pleural space and metastatic carcinoma, red cell transfusions 09/09/2018 09/10/2018, 09/23/2018-improved; progressive 04/26/2019, red cell transfusion.  Improved. 3.  Dyspnea secondary to the large right pleural effusion and anemia Right Pleurx catheter placed 09/11/2018 Chest x-ray 10/09/2018- large right pleural effusion, loculated Chest x-ray 11/06/2018- no interval change in the right-sided pleural effusion Pleurx drainage catheter removed 11/13/2018 4.  History of tobacco use 5.  History of kidney stones 6.  Fever-most likely tumor fever, improved 7.  Admission 10/08/2018 with rapid atrial fibrillation/flutter- improved with diltiazem, started on Eliquis anticoagulation.  Eliquis discontinued.  Now on aspirin. 8.  Renal insufficiency  Disposition: Brian Ramirez appears unchanged.  He has worsening dyspnea and has resumed  supplemental oxygen.  Breath sounds are diminished throughout the right lung field.  We are referring him for a diagnostic/therapeutic right thoracentesis.  He will return to the lab today for a CBC.  We will also obtain a chemistry panel and lipid panel for cardiology.  He will return for follow-up in approximately 2 weeks.  We are available to see him sooner if needed.  Patient seen with Dr. [name].  Dequann Vandervelden ANP/GNP-BC   07/05/2021  2:28 PM  This was a shared visit with Ned Card.  Brian Ramirez was interviewed and examined.  He has developed progressive dyspnea over the past 2 months.  We suspect her dyspnea is related to the right effusion or progressive parenchymal consolidation.  He may have progressive tumor in the lungs.  We will refer him for a diagnostic/therapeutic thoracentesis.  He will be scheduled for a repeat chest CT if the thoracentesis does not improve his dyspnea.  I was present greater than 50% of today's visit.  I performed medical decision making.  Julieanne Manson, MD

## 2021-07-06 ENCOUNTER — Ambulatory Visit (HOSPITAL_COMMUNITY)
Admission: RE | Admit: 2021-07-06 | Discharge: 2021-07-06 | Disposition: A | Discharge status: Home / Self Care | Payer: Medicare Other | Source: Ambulatory Visit | Attending: Nurse Practitioner | Admitting: Nurse Practitioner

## 2021-07-06 ENCOUNTER — Other Ambulatory Visit: Payer: Self-pay | Admitting: Nurse Practitioner

## 2021-07-06 ENCOUNTER — Encounter: Payer: Self-pay | Admitting: Oncology

## 2021-07-06 DIAGNOSIS — J948 Other specified pleural conditions: Secondary | ICD-10-CM | POA: Diagnosis not present

## 2021-07-06 DIAGNOSIS — J90 Pleural effusion, not elsewhere classified: Secondary | ICD-10-CM | POA: Diagnosis not present

## 2021-07-06 DIAGNOSIS — Z23 Encounter for immunization: Secondary | ICD-10-CM

## 2021-07-06 DIAGNOSIS — C3490 Malignant neoplasm of unspecified part of unspecified bronchus or lung: Secondary | ICD-10-CM

## 2021-07-06 HISTORY — PX: IR THORACENTESIS ASP PLEURAL SPACE W/IMG GUIDE: IMG5380

## 2021-07-06 LAB — BODY FLUID CELL COUNT WITH DIFFERENTIAL
Other Cells, Fluid: UNDETERMINED %
Total Nucleated Cell Count, Fluid: 456000 cu mm — ABNORMAL HIGH (ref 0–1000)

## 2021-07-06 LAB — COMPREHENSIVE METABOLIC PANEL
ALT: 13 IU/L (ref 0–44)
AST: 26 IU/L (ref 0–40)
Albumin/Globulin Ratio: 0.9 — ABNORMAL LOW (ref 1.2–2.2)
Albumin: 4.2 g/dL (ref 3.7–4.7)
Alkaline Phosphatase: 112 IU/L (ref 44–121)
BUN/Creatinine Ratio: 17 (ref 10–24)
BUN: 22 mg/dL (ref 8–27)
Bilirubin Total: 0.3 mg/dL (ref 0.0–1.2)
CO2: 26 mmol/L (ref 20–29)
Calcium: 9.9 mg/dL (ref 8.6–10.2)
Chloride: 98 mmol/L (ref 96–106)
Creatinine, Ser: 1.33 mg/dL — ABNORMAL HIGH (ref 0.76–1.27)
Globulin, Total: 4.5 g/dL (ref 1.5–4.5)
Glucose: 101 mg/dL — ABNORMAL HIGH (ref 70–99)
Potassium: 4.9 mmol/L (ref 3.5–5.2)
Sodium: 137 mmol/L (ref 134–144)
Total Protein: 8.7 g/dL — ABNORMAL HIGH (ref 6.0–8.5)
eGFR: 56 mL/min/{1.73_m2} — ABNORMAL LOW (ref >59)

## 2021-07-06 LAB — LIPID PANEL
Chol/HDL Ratio: 3.4 ratio (ref 0.0–5.0)
Cholesterol, Total: 192 mg/dL (ref 100–199)
HDL: 56 mg/dL (ref >39)
LDL Chol Calc (NIH): 122 mg/dL — ABNORMAL HIGH (ref 0–99)
Triglycerides: 76 mg/dL (ref 0–149)
VLDL Cholesterol Cal: 14 mg/dL (ref 5–40)

## 2021-07-06 MED ORDER — LIDOCAINE HCL 1 % IJ SOLN
INTRAMUSCULAR | Status: DC | PRN
  Administered 2021-07-06: 5 mL via INTRADERMAL

## 2021-07-06 MED ORDER — LIDOCAINE HCL 1 % IJ SOLN
INTRAMUSCULAR | Status: AC
  Filled 2021-07-06: qty 20

## 2021-07-06 NOTE — Procedures (Signed)
PROCEDURE SUMMARY:  Successful US guided right thoracentesis. Yielded 350cc of thick bloody fluid. Pt tolerated procedure well. No immediate complications.  Specimen was sent for labs. CXR ordered.  EBL < 5 mL  Sharone Picchi PA-C 07/06/2021 10:07 AM

## 2021-07-07 ENCOUNTER — Telehealth: Payer: Self-pay | Admitting: Nurse Practitioner

## 2021-07-07 ENCOUNTER — Telehealth: Payer: Self-pay | Admitting: *Deleted

## 2021-07-07 NOTE — Telephone Encounter (Signed)
Scheduled appointment per 11/22 sch msg. Left message.

## 2021-07-07 NOTE — Telephone Encounter (Signed)
Notified patient via VM that his Ca+ level was elevated in office, thus reason for repeat lab on 11/28.

## 2021-07-09 LAB — CYTOLOGY - NON PAP

## 2021-07-09 LAB — BODY FLUID CULTURE W GRAM STAIN: Culture: NO GROWTH

## 2021-07-12 ENCOUNTER — Inpatient Hospital Stay: Payer: Medicare Other

## 2021-07-13 ENCOUNTER — Telehealth: Payer: Self-pay | Admitting: *Deleted

## 2021-07-13 NOTE — Telephone Encounter (Signed)
Called to reschedule missed lab on 11/28. Asking why his Ca+ same day was two different values. Explained that 9.9 was processed at [facility] and the 10.6 was here. Need to see what the value is here on usual lab. If still elevated, he may need treatment to lower it. He agrees to come in tomorrow at [facility].

## 2021-07-14 ENCOUNTER — Inpatient Hospital Stay: Payer: Medicare Other

## 2021-07-14 ENCOUNTER — Other Ambulatory Visit: Payer: Self-pay

## 2021-07-14 DIAGNOSIS — C3490 Malignant neoplasm of unspecified part of unspecified bronchus or lung: Secondary | ICD-10-CM

## 2021-07-14 DIAGNOSIS — C3491 Malignant neoplasm of unspecified part of right bronchus or lung: Secondary | ICD-10-CM | POA: Diagnosis not present

## 2021-07-14 DIAGNOSIS — Z23 Encounter for immunization: Secondary | ICD-10-CM | POA: Diagnosis not present

## 2021-07-14 DIAGNOSIS — C771 Secondary and unspecified malignant neoplasm of intrathoracic lymph nodes: Secondary | ICD-10-CM | POA: Diagnosis not present

## 2021-07-14 LAB — CMP (CANCER CENTER ONLY)
ALT: 11 U/L (ref 0–44)
AST: 18 U/L (ref 15–41)
Albumin: 3.9 g/dL (ref 3.5–5.0)
Alkaline Phosphatase: 82 U/L (ref 38–126)
Anion gap: 9 (ref 5–15)
BUN: 30 mg/dL — ABNORMAL HIGH (ref 8–23)
CO2: 29 mmol/L (ref 22–32)
Calcium: 10.3 mg/dL (ref 8.9–10.3)
Chloride: 102 mmol/L (ref 98–111)
Creatinine: 1.32 mg/dL — ABNORMAL HIGH (ref 0.61–1.24)
GFR, Estimated: 57 mL/min — ABNORMAL LOW (ref >60)
Glucose, Bld: 105 mg/dL — ABNORMAL HIGH (ref 70–99)
Potassium: 4.5 mmol/L (ref 3.5–5.1)
Sodium: 140 mmol/L (ref 135–145)
Total Bilirubin: 0.4 mg/dL (ref 0.3–1.2)
Total Protein: 7.7 g/dL (ref 6.5–8.1)

## 2021-07-15 ENCOUNTER — Telehealth: Payer: Self-pay | Admitting: *Deleted

## 2021-07-15 NOTE — Telephone Encounter (Signed)
Left VM for patient that his Ca+ level is at high normal range. No intervention is required. Continue to hydrate well and follow up as scheduled.

## 2021-07-20 ENCOUNTER — Encounter: Payer: Self-pay | Admitting: Nurse Practitioner

## 2021-07-20 ENCOUNTER — Inpatient Hospital Stay: Payer: Medicare Other | Attending: Nurse Practitioner | Admitting: Nurse Practitioner

## 2021-07-20 ENCOUNTER — Encounter: Payer: Self-pay | Admitting: *Deleted

## 2021-07-20 ENCOUNTER — Other Ambulatory Visit: Payer: Self-pay

## 2021-07-20 VITALS — BP 149/82 | HR 77 | Temp 98.1°F | Resp 20 | Ht 73.0 in | Wt 143.8 lb

## 2021-07-20 DIAGNOSIS — Z5112 Encounter for antineoplastic immunotherapy: Secondary | ICD-10-CM | POA: Insufficient documentation

## 2021-07-20 DIAGNOSIS — C3490 Malignant neoplasm of unspecified part of unspecified bronchus or lung: Secondary | ICD-10-CM | POA: Diagnosis not present

## 2021-07-20 DIAGNOSIS — Z79899 Other long term (current) drug therapy: Secondary | ICD-10-CM | POA: Insufficient documentation

## 2021-07-20 DIAGNOSIS — C3491 Malignant neoplasm of unspecified part of right bronchus or lung: Secondary | ICD-10-CM | POA: Diagnosis not present

## 2021-07-20 DIAGNOSIS — J910 Malignant pleural effusion: Secondary | ICD-10-CM | POA: Diagnosis not present

## 2021-07-20 DIAGNOSIS — C771 Secondary and unspecified malignant neoplasm of intrathoracic lymph nodes: Secondary | ICD-10-CM | POA: Insufficient documentation

## 2021-07-20 NOTE — Progress Notes (Signed)
[facility] OFFICE PROGRESS NOTE   Diagnosis: Non-small cell lung cancer  INTERVAL HISTORY:   Brian Ramirez returns as scheduled.  He underwent a thoracentesis on 07/06/2021 with 350 cc of pleural fluid removed.  He feels more short of breath since the procedure.  He notes he is utilizing supplemental oxygen more frequently at 1 to 2 L.  He denies fever.  No cough.  He continues to note excessive mucus production.  Objective:  Vital signs in last 24 hours:  Blood pressure (!) 149/82, pulse 77, temperature 98.1 F (36.7 C), temperature source Oral, resp. rate 20, height _0  (1.854 m), weight 143 lb 12.8 oz (65.2 kg), SpO2 100 %.    HEENT: No thrush or ulcers. Resp: Diminished breath sounds throughout the right lung field.  No respiratory distress. Cardio: Irregular. GI: No hepatosplenomegaly. Vascular: No leg edema.    Lab Results:  Lab Results  Component Value Date   WBC 8.2 07/05/2021   HGB 12.9 (L) 07/05/2021   HCT 42.3 07/05/2021   MCV 87.8 07/05/2021   PLT 293 07/05/2021   NEUTROABS 6.3 07/05/2021    Imaging:  No results found.  Medications: I have reviewed the patient's current medications.  Assessment/Plan: Metastatic non-small cell lung cancer  large right pleural effusion, right lung mass, right pleural-based masses, left lung nodules Right thoracentesis 09/04/2018-negative cytology CT biopsy of right lateral pleural mass 09/07/2018- poorly differentiated non-small cell carcinoma, malignant cells positive for cytokeratin AE1/AE3 and cytokeratin 8/18, histology favoring adenocarcinoma, MSS, tumor mutation burden 5.no EGFR, ALK, BRAF alteration.  PDL 1 rearrangement PDL 1 tumor proportion score-100%  Cycle 1 Alimta/carboplatin 09/22/2018, pembrolizumab 09/28/2018 Cycle 2 Alimta/carboplatin/pembrolizumab 10/15/2018 Cycle 3 Alimta/carboplatin/pembrolizumab 11/06/2018 Cycle 4 Alimta/carboplatin/pembrolizumab 11/28/2018 CT chest 12/14/2018- response to  therapy of lung primary/primaries, thoracic nodal and pulmonary metastasis.  Decrease in right pleural effusion with improvement in pleural-based metastasis.  New T10 heterogeneous sclerotic density likely due to healing of previously CT occult metastasis. Pembrolizumab 12/19/2018 Pembrolizumab/Alimta starting 01/09/2019 Pembrolizumab/Alimta 01/31/2019 Pembrolizumab/Alimta 02/21/2019 Pembrolizumab/Alimta 03/14/2019 CT 04/01/2019- no evidence of disease progression, small loculated right pleural effusion and right basilar pleural nodularity-stable, stable left lower lobe nodule, persistent sclerosis at T10 Pembrolizumab/Alimta 04/05/2019 Pembrolizumab 05/07/2019 Pembrolizumab 05/28/2019 Pembrolizumab 06/18/2019 Pembrolizumab 07/09/2019 Chest CT 07/26/2019-moderate to large stable exudative right effusion with considerable pleural thickening.  Nodularity at the right lung base along pleural thickening similar to prior.  Stable left paramediastinal groundglass density nodule.  Stable sclerotic lesion posteriorly at the T10 vertebral level. Pembrolizumab 07/30/2019 Pembrolizumab 08/21/2019-changed to every 6-week dosing Pembrolizumab 10/11/2019 Pembrolizumab 11/22/2019 CT chest 01/03/2020-chronic appearing loculated effusion right chest with signs of pleural thickening smaller than on prior exam; similar appearance of tree-in-bud bud nodularity in the right upper lobe and left lung apex along with bronchiectatic changes in the lingula and middle lobe; similar appearance of sclerosis T10 vertebral body Pembrolizumab 01/03/2020 Pembrolizumab 02/14/2020 Pembrolizumab 03/27/2020 Chest CT 05/06/2020-unchanged posttreatment appearance of the chest with a chronic, loculated moderate right pleural effusion with associated atelectasis or consolidation and pleural thickening.  Stable small pulmonary nodules measuring 3 mm or smaller.  Pembrolizumab 05/08/2020 Pembrolizumab 06/19/2020 Pembrolizumab 08/03/2020 Pembrolizumab  09/14/2020 CT chest 11/04/2020-persistent and enlarging complex loculated right pleural fluid collection.  No findings suspicious for recurrent tumor, mediastinal/hilar adenopathy or pulmonary metastatic disease. CT chest 02/04/2021-chronic loculated right pleural effusion, new ill-defined right upper lobe nodule-nonspecific, coronary and aortic valve calcifications  (next scan planned at a 85-monthinterval) CT chest 05/21/2021-substantial increase peripheral airspace opacity in the right  upper lobe and superior segment right lower lobe.  Appearance suspicious for multi lobar pneumonia.  Stable thick-walled chronic large right pleural effusion.  New part solid 1.2 cm superior segment left lower lobe nodule.  Stable left upper lobe groundglass density pulmonary nodule.  Increased bandlike nodular opacity laterally in the left lower lobe. 10-day course of Levaquin   Chest x-ray 06/09/2021-new patchy airspace disease left upper lobe, right upper lobe airspace disease has minimally decreased but not resolved.  Stable loculated right pleural effusion Thoracentesis 07/06/2021-350 cc of thick bloody fluid removed.  Cytology with atypical cells present.  Culture with no growth.   2.  Severe anemia-likely secondary to bleeding in the right pleural space and metastatic carcinoma, red cell transfusions 09/09/2018 09/10/2018, 09/23/2018-improved; progressive 04/26/2019, red cell transfusion.  Improved. 3.  Dyspnea secondary to the large right pleural effusion and anemia Right Pleurx catheter placed 09/11/2018 Chest x-ray 10/09/2018- large right pleural effusion, loculated Chest x-ray 11/06/2018- no interval change in the right-sided pleural effusion Pleurx drainage catheter removed 11/13/2018 4.  History of tobacco use 5.  History of kidney stones 6.  Fever-most likely tumor fever, improved 7.  Admission 10/08/2018 with rapid atrial fibrillation/flutter- improved with diltiazem, started on Eliquis anticoagulation.  Eliquis  discontinued.  Now on aspirin. 8.  Renal insufficiency    Disposition: Brian Ramirez appears unchanged.  He noted no improvement in dyspnea following the thoracentesis.  Cytology showed atypical cells with reactive atypia favored.  Culture was negative.  We are referring him for a follow-up noncontrast CT scan of the chest.  He will return for an office visit in 1 week.  We are available to see him sooner if needed.  Patient seen with Dr. [name].    Brian Ramirez ANP/GNP-BC   07/20/2021  1:36 PM This was a shared visit with Ned Card.  Mr. Debow appears stable.  He continues to have increased dyspnea.  The thoracentesis 07/06/2021 was nondiagnostic.  We will refer him for a repeat chest CT to evaluate the persistent dyspnea.  I was present for greater than 50% of today's visit.  I performed medical decision making.  Julieanne Manson, MD

## 2021-07-26 ENCOUNTER — Ambulatory Visit (HOSPITAL_BASED_OUTPATIENT_CLINIC_OR_DEPARTMENT_OTHER)
Admission: RE | Admit: 2021-07-26 | Discharge: 2021-07-26 | Disposition: A | Discharge status: Home / Self Care | Payer: Medicare Other | Source: Ambulatory Visit | Attending: Nurse Practitioner | Admitting: Nurse Practitioner

## 2021-07-26 ENCOUNTER — Other Ambulatory Visit: Payer: Self-pay

## 2021-07-26 DIAGNOSIS — J90 Pleural effusion, not elsewhere classified: Secondary | ICD-10-CM | POA: Diagnosis not present

## 2021-07-26 DIAGNOSIS — I700 Atherosclerosis of aorta: Secondary | ICD-10-CM | POA: Diagnosis not present

## 2021-07-26 DIAGNOSIS — J9811 Atelectasis: Secondary | ICD-10-CM | POA: Diagnosis not present

## 2021-07-26 DIAGNOSIS — C3490 Malignant neoplasm of unspecified part of unspecified bronchus or lung: Secondary | ICD-10-CM | POA: Diagnosis not present

## 2021-07-26 DIAGNOSIS — J439 Emphysema, unspecified: Secondary | ICD-10-CM | POA: Diagnosis not present

## 2021-07-27 ENCOUNTER — Inpatient Hospital Stay (HOSPITAL_BASED_OUTPATIENT_CLINIC_OR_DEPARTMENT_OTHER): Payer: Medicare Other | Admitting: Nurse Practitioner

## 2021-07-27 ENCOUNTER — Encounter: Payer: Self-pay | Admitting: Nurse Practitioner

## 2021-07-27 VITALS — BP 131/81 | HR 100 | Temp 98.1°F | Resp 20 | Ht 73.0 in | Wt 144.8 lb

## 2021-07-27 DIAGNOSIS — C3490 Malignant neoplasm of unspecified part of unspecified bronchus or lung: Secondary | ICD-10-CM

## 2021-07-27 DIAGNOSIS — J910 Malignant pleural effusion: Secondary | ICD-10-CM | POA: Diagnosis not present

## 2021-07-27 DIAGNOSIS — R5383 Other fatigue: Secondary | ICD-10-CM

## 2021-07-27 DIAGNOSIS — C3491 Malignant neoplasm of unspecified part of right bronchus or lung: Secondary | ICD-10-CM | POA: Diagnosis not present

## 2021-07-27 DIAGNOSIS — C771 Secondary and unspecified malignant neoplasm of intrathoracic lymph nodes: Secondary | ICD-10-CM | POA: Diagnosis not present

## 2021-07-27 DIAGNOSIS — Z5112 Encounter for antineoplastic immunotherapy: Secondary | ICD-10-CM | POA: Diagnosis not present

## 2021-07-27 DIAGNOSIS — Z79899 Other long term (current) drug therapy: Secondary | ICD-10-CM | POA: Diagnosis not present

## 2021-07-27 NOTE — Progress Notes (Signed)
Brian Ramirez   Diagnosis: Non-small cell lung cancer  INTERVAL HISTORY:   Brian Ramirez returns as scheduled.  He reports continued "mucus" and dyspnea.  No fever.  He has recently noticed intermittent pain at the left lower chest/upper abdomen.  Objective:  Vital signs in last 24 hours:  Blood pressure 131/81, pulse 100, temperature 98.1 F (36.7 C), temperature source Oral, resp. rate 20, height '6\' 1"'  (1.854 m), weight 144 lb 12.8 oz (65.7 kg), SpO2 96 %.    Resp: Diminished breath sounds throughout the right lung field.  No respiratory distress. Cardio: Irregular. GI: Abdomen soft and nontender.  No hepatosplenomegaly. Vascular: No leg edema. Musculoskeletal: Nontender over the left lower anterior chest wall/ribs.  Lab Results:  Lab Results  Component Value Date   WBC 8.2 07/05/2021   HGB 12.9 (L) 07/05/2021   HCT 42.3 07/05/2021   MCV 87.8 07/05/2021   PLT 293 07/05/2021   NEUTROABS 6.3 07/05/2021    Imaging:  No results found.  Medications: I have reviewed the patient's current medications.  Assessment/Plan: Metastatic non-small cell lung cancer  large right pleural effusion, right lung mass, right pleural-based masses, left lung nodules Right thoracentesis 09/04/2018-negative cytology CT biopsy of right lateral pleural mass 09/07/2018- poorly differentiated non-small cell carcinoma, malignant cells positive for cytokeratin AE1/AE3 and cytokeratin 8/18, histology favoring adenocarcinoma, MSS, tumor mutation burden 5.no EGFR, ALK, BRAF alteration.  PDL 1 rearrangement PDL 1 tumor proportion score-100%  Cycle 1 Alimta/carboplatin 09/22/2018, pembrolizumab 09/28/2018 Cycle 2 Alimta/carboplatin/pembrolizumab 10/15/2018 Cycle 3 Alimta/carboplatin/pembrolizumab 11/06/2018 Cycle 4 Alimta/carboplatin/pembrolizumab 11/28/2018 CT chest 12/14/2018- response to therapy of lung primary/primaries, thoracic nodal and pulmonary metastasis.  Decrease in  right pleural effusion with improvement in pleural-based metastasis.  New T10 heterogeneous sclerotic density likely due to healing of previously CT occult metastasis. Pembrolizumab 12/19/2018 Pembrolizumab/Alimta starting 01/09/2019 Pembrolizumab/Alimta 01/31/2019 Pembrolizumab/Alimta 02/21/2019 Pembrolizumab/Alimta 03/14/2019 CT 04/01/2019- no evidence of disease progression, small loculated right pleural effusion and right basilar pleural nodularity-stable, stable left lower lobe nodule, persistent sclerosis at T10 Pembrolizumab/Alimta 04/05/2019 Pembrolizumab 05/07/2019 Pembrolizumab 05/28/2019 Pembrolizumab 06/18/2019 Pembrolizumab 07/09/2019 Chest CT 07/26/2019-moderate to large stable exudative right effusion with considerable pleural thickening.  Nodularity at the right lung base along pleural thickening similar to prior.  Stable left paramediastinal groundglass density nodule.  Stable sclerotic lesion posteriorly at the T10 vertebral level. Pembrolizumab 07/30/2019 Pembrolizumab 08/21/2019-changed to every 6-week dosing Pembrolizumab 10/11/2019 Pembrolizumab 11/22/2019 CT chest 01/03/2020-chronic appearing loculated effusion right chest with signs of pleural thickening smaller than on prior exam; similar appearance of tree-in-bud bud nodularity in the right upper lobe and left lung apex along with bronchiectatic changes in the lingula and middle lobe; similar appearance of sclerosis T10 vertebral body Pembrolizumab 01/03/2020 Pembrolizumab 02/14/2020 Pembrolizumab 03/27/2020 Chest CT 05/06/2020-unchanged posttreatment appearance of the chest with a chronic, loculated moderate right pleural effusion with associated atelectasis or consolidation and pleural thickening.  Stable small pulmonary nodules measuring 3 mm or smaller.  Pembrolizumab 05/08/2020 Pembrolizumab 06/19/2020 Pembrolizumab 08/03/2020 Pembrolizumab 09/14/2020 CT chest 11/04/2020-persistent and enlarging complex loculated right pleural fluid  collection.  No findings suspicious for recurrent tumor, mediastinal/hilar adenopathy or pulmonary metastatic disease. CT chest 02/04/2021-chronic loculated right pleural effusion, new ill-defined right upper lobe nodule-nonspecific, coronary and aortic valve calcifications  (next scan planned at a 82-monthinterval) CT chest 05/21/2021-substantial increase peripheral airspace opacity in the right upper lobe and superior segment right lower lobe.  Appearance suspicious for multi lobar pneumonia.  Stable thick-walled chronic large right pleural effusion.  New  part solid 1.2 cm superior segment left lower lobe nodule.  Stable left upper lobe groundglass density pulmonary nodule.  Increased bandlike nodular opacity laterally in the left lower lobe. 10-day course of Levaquin   Chest x-ray 06/09/2021-new patchy airspace disease left upper lobe, right upper lobe airspace disease has minimally decreased but not resolved.  Stable loculated right pleural effusion Thoracentesis 07/06/2021-350 cc of thick bloody fluid removed.  Cytology with atypical cells present.  Culture with no growth. CT chest 07/26/2021-progressive ill-defined nodular airspace process in the left lower lobe with hazy surrounding groundglass opacity.  Significant progression of ill-defined groundglass opacity and slight interstitial thickening left upper lobe.  Improved appearance right upper lobe.  Stable thick-walled loculated right pleural fluid collection.  New small left pleural effusion.  Stable underlying lung disease.  No mediastinal or hilar mass or adenopathy.   2.  Severe anemia-likely secondary to bleeding in the right pleural space and metastatic carcinoma, red cell transfusions 09/09/2018 09/10/2018, 09/23/2018-improved; progressive 04/26/2019, red cell transfusion.  Improved. 3.  Dyspnea secondary to the large right pleural effusion and anemia Right Pleurx catheter placed 09/11/2018 Chest x-ray 10/09/2018- large right pleural effusion,  loculated Chest x-ray 11/06/2018- no interval change in the right-sided pleural effusion Pleurx drainage catheter removed 11/13/2018 4.  History of tobacco use 5.  History of kidney stones 6.  Fever-most likely tumor fever, improved 7.  Admission 10/08/2018 with rapid atrial fibrillation/flutter- improved with diltiazem, started on Eliquis anticoagulation.  Eliquis discontinued.  Now on aspirin. 8.  Renal insufficiency  Disposition: Brian Ramirez appears unchanged.  He continues to have dyspnea.  Dr. [name] reviewed the CT images with him at today's visit.  Brian Ramirez understands there are changes concerning for progression of the cancer.  We will follow-up on the final CT report.  Dr. [name] recommends resuming immunotherapy with Pembrolizumab on a 3-week schedule.  Brian Ramirez has had Pembrolizumab in the past and is familiar with potential side effects.  He agrees to proceed.  In addition Dr. [name] recommends a referral to Dr. Valeta Harms to consider bronchoscopy for biopsy of progressive lung densities, also recommendations for excessive mucus production.  Brian Ramirez will return for cycle 1 Pembrolizumab 07/30/2021.  We will see him in follow-up prior to cycle 2 on 08/20/2021.  He will contact the office in the interim with any problems.  Patient seen with Dr. [name].    Edker Punt ANP/GNP-BC   07/27/2021  1:23 PM Brian Ramirez was interviewed and examined. Brian Ramirez continues to have dyspnea.  We reviewed chest CT images with him.  There appears to be progression of parenchymal densities in the left chest.  Stable right lung volume loss and pleural effusion.  We are waiting on the final CT report.  I suspect the CT changes are related to progression of lung cancer.  We will refer him to pulmonary medicine to evaluate the dyspnea and to consider a diagnostic bronchoscopy.  He will resume pembrolizumab.  I was present for greater than 50% of today's visit.  I performed medical  decision making.  Julieanne Manson, MD

## 2021-07-28 ENCOUNTER — Encounter: Payer: Self-pay | Admitting: *Deleted

## 2021-07-28 NOTE — Progress Notes (Signed)
Faxed referral order and chart information to Dr. Valeta Harms w/[facility] at [phone].

## 2021-07-30 ENCOUNTER — Inpatient Hospital Stay: Payer: Medicare Other

## 2021-07-30 ENCOUNTER — Other Ambulatory Visit: Payer: Self-pay

## 2021-07-30 VITALS — BP 124/59 | HR 68 | Temp 98.3°F | Resp 20 | Wt 144.4 lb

## 2021-07-30 DIAGNOSIS — J910 Malignant pleural effusion: Secondary | ICD-10-CM | POA: Diagnosis not present

## 2021-07-30 DIAGNOSIS — Z79899 Other long term (current) drug therapy: Secondary | ICD-10-CM | POA: Diagnosis not present

## 2021-07-30 DIAGNOSIS — C771 Secondary and unspecified malignant neoplasm of intrathoracic lymph nodes: Secondary | ICD-10-CM | POA: Diagnosis not present

## 2021-07-30 DIAGNOSIS — Z5112 Encounter for antineoplastic immunotherapy: Secondary | ICD-10-CM | POA: Diagnosis not present

## 2021-07-30 DIAGNOSIS — C3490 Malignant neoplasm of unspecified part of unspecified bronchus or lung: Secondary | ICD-10-CM

## 2021-07-30 DIAGNOSIS — C3491 Malignant neoplasm of unspecified part of right bronchus or lung: Secondary | ICD-10-CM | POA: Diagnosis not present

## 2021-07-30 LAB — CBC WITH DIFFERENTIAL (CANCER CENTER ONLY)
Abs Immature Granulocytes: 0.01 10*3/uL (ref 0.00–0.07)
Basophils Absolute: 0 10*3/uL (ref 0.0–0.1)
Basophils Relative: 1 %
Eosinophils Absolute: 0.5 10*3/uL (ref 0.0–0.5)
Eosinophils Relative: 6 %
HCT: 39.8 % (ref 39.0–52.0)
Hemoglobin: 12.2 g/dL — ABNORMAL LOW (ref 13.0–17.0)
Immature Granulocytes: 0 %
Lymphocytes Relative: 10 %
Lymphs Abs: 0.9 10*3/uL (ref 0.7–4.0)
MCH: 26.5 pg (ref 26.0–34.0)
MCHC: 30.7 g/dL (ref 30.0–36.0)
MCV: 86.5 fL (ref 80.0–100.0)
Monocytes Absolute: 1 10*3/uL (ref 0.1–1.0)
Monocytes Relative: 12 %
Neutro Abs: 6 10*3/uL (ref 1.7–7.7)
Neutrophils Relative %: 71 %
Platelet Count: 225 10*3/uL (ref 150–400)
RBC: 4.6 MIL/uL (ref 4.22–5.81)
RDW: 15.1 % (ref 11.5–15.5)
WBC Count: 8.5 10*3/uL (ref 4.0–10.5)
nRBC: 0 % (ref 0.0–0.2)

## 2021-07-30 LAB — CMP (CANCER CENTER ONLY)
ALT: 10 U/L (ref 0–44)
AST: 18 U/L (ref 15–41)
Albumin: 3.9 g/dL (ref 3.5–5.0)
Alkaline Phosphatase: 84 U/L (ref 38–126)
Anion gap: 8 (ref 5–15)
BUN: 26 mg/dL — ABNORMAL HIGH (ref 8–23)
CO2: 28 mmol/L (ref 22–32)
Calcium: 9.6 mg/dL (ref 8.9–10.3)
Chloride: 101 mmol/L (ref 98–111)
Creatinine: 1.32 mg/dL — ABNORMAL HIGH (ref 0.61–1.24)
GFR, Estimated: 57 mL/min — ABNORMAL LOW (ref >60)
Glucose, Bld: 104 mg/dL — ABNORMAL HIGH (ref 70–99)
Potassium: 4.3 mmol/L (ref 3.5–5.1)
Sodium: 137 mmol/L (ref 135–145)
Total Bilirubin: 0.4 mg/dL (ref 0.3–1.2)
Total Protein: 8 g/dL (ref 6.5–8.1)

## 2021-07-30 MED ORDER — SODIUM CHLORIDE 0.9 % IV SOLN
200.0000 mg | Freq: Once | INTRAVENOUS | Status: AC
  Administered 2021-07-30: 200 mg via INTRAVENOUS
  Filled 2021-07-30: qty 8

## 2021-07-30 MED ORDER — SODIUM CHLORIDE 0.9 % IV SOLN: Freq: Once | INTRAVENOUS | Status: AC

## 2021-07-30 NOTE — Patient Instructions (Signed)
Brian Ramirez   Discharge Instructions: Thank you for choosing [facility] to provide your oncology and hematology care.   If you have a lab appointment with the [facility], please go directly to [facility] and check in at the registration area.   Wear comfortable clothing and clothing appropriate for easy access to any Portacath or PICC line.   We strive to give you quality time with your provider. You may need to reschedule your appointment if you arrive late (15 or more minutes).  Arriving late affects you and other patients whose appointments are after yours.  Also, if you miss three or more appointments without notifying the office, you may be dismissed from the clinic at the providers discretion.      For prescription refill requests, have your pharmacy contact our office and allow 72 hours for refills to be completed.    Today you received the following chemotherapy and/or immunotherapy agents Keytruda      To help prevent nausea and vomiting after your treatment, we encourage you to take your nausea medication as directed.  BELOW ARE SYMPTOMS THAT SHOULD BE REPORTED IMMEDIATELY: *FEVER GREATER THAN 100.4 F (38 C) OR HIGHER *CHILLS OR SWEATING *NAUSEA AND VOMITING THAT IS NOT CONTROLLED WITH YOUR NAUSEA MEDICATION *UNUSUAL SHORTNESS OF BREATH *UNUSUAL BRUISING OR BLEEDING *URINARY PROBLEMS (pain or burning when urinating, or frequent urination) *BOWEL PROBLEMS (unusual diarrhea, constipation, pain near the anus) TENDERNESS IN MOUTH AND THROAT WITH OR WITHOUT PRESENCE OF ULCERS (sore throat, sores in mouth, or a toothache) UNUSUAL RASH, SWELLING OR PAIN  UNUSUAL VAGINAL DISCHARGE OR ITCHING   Items with * indicate a potential emergency and should be followed up as soon as possible or go to the Emergency Department if any problems should occur.  Please show the CHEMOTHERAPY ALERT CARD or IMMUNOTHERAPY ALERT CARD at check-in to the  Emergency Department and triage nurse.  Should you have questions after your visit or need to cancel or reschedule your appointment, please contact [facility]  Dept: [phone]  and follow the prompts.  Office hours are 8:00 a.m. to 4:30 p.m. Monday - Friday. Please note that voicemails left after 4:00 p.m. may not be returned until the following business day.  We are closed weekends and major holidays. You have access to a nurse at all times for urgent questions. Please call the main number to the clinic Dept: [phone] and follow the prompts.   For any non-urgent questions, you may also contact your provider using MyChart. We now offer e-Visits for anyone 107 and older to request care online for non-urgent symptoms. For details visit [email or web address].   Also download the MyChart app! Go to the app store, search "MyChart", open the app, select [facility], and log in with your MyChart username and password.  Due to Covid, a mask is required upon entering the hospital/clinic. If you do not have a mask, one will be given to you upon arrival. For doctor visits, patients may have 1 support person aged 43 or older with them. For treatment visits, patients cannot have anyone with them due to current Covid guidelines and our immunocompromised population.   Pembrolizumab injection What is this medication? PEMBROLIZUMAB (pem broe liz ue mab) is a monoclonal antibody. It is used to treat certain types of cancer. This medicine may be used for other purposes; ask your health care provider or pharmacist if you have questions. COMMON BRAND NAME(S): [employer]  What should I tell my care team before I take this medication? They need to know if you have any of these conditions: autoimmune diseases like Crohn's disease, ulcerative colitis, or lupus have had or planning to have an allogeneic stem cell transplant (uses someone else's stem cells) history of organ  transplant history of chest radiation nervous system problems like myasthenia gravis or Guillain-Barre syndrome an unusual or allergic reaction to pembrolizumab, other medicines, foods, dyes, or preservatives pregnant or trying to get pregnant breast-feeding How should I use this medication? This medicine is for infusion into a vein. It is given by a health care professional in a hospital or clinic setting. A special MedGuide will be given to you before each treatment. Be sure to read this information carefully each time. Talk to your pediatrician regarding the use of this medicine in children. While this drug may be prescribed for children as young as 6 months for selected conditions, precautions do apply. Overdosage: If you think you have taken too much of this medicine contact a poison control center or emergency room at once. NOTE: This medicine is only for you. Do not share this medicine with others. What if I miss a dose? It is important not to miss your dose. Call your doctor or health care professional if you are unable to keep an appointment. What may interact with this medication? Interactions have not been studied. This list may not describe all possible interactions. Give your health care provider a list of all the medicines, herbs, non-prescription drugs, or dietary supplements you use. Also tell them if you smoke, drink alcohol, or use illegal drugs. Some items may interact with your medicine. What should I watch for while using this medication? Your condition will be monitored carefully while you are receiving this medicine. You may need blood work done while you are taking this medicine. Do not become pregnant while taking this medicine or for 4 months after stopping it. Women should inform their doctor if they wish to become pregnant or think they might be pregnant. There is a potential for serious side effects to an unborn child. Talk to your health care professional or  pharmacist for more information. Do not breast-feed an infant while taking this medicine or for 4 months after the last dose. What side effects may I notice from receiving this medication? Side effects that you should report to your doctor or health care professional as soon as possible: allergic reactions like skin rash, itching or hives, swelling of the face, lips, or tongue bloody or black, tarry breathing problems changes in vision chest pain chills confusion constipation cough diarrhea dizziness or feeling faint or lightheaded fast or irregular heartbeat fever flushing joint pain low blood counts - this medicine may decrease the number of white blood cells, red blood cells and platelets. You may be at increased risk for infections and bleeding. muscle pain muscle weakness pain, tingling, numbness in the hands or feet persistent headache redness, blistering, peeling or loosening of the skin, including inside the mouth signs and symptoms of high blood sugar such as dizziness; dry mouth; dry skin; fruity breath; nausea; stomach pain; increased hunger or thirst; increased urination signs and symptoms of kidney injury like trouble passing urine or change in the amount of urine signs and symptoms of liver injury like dark urine, light-colored stools, loss of appetite, nausea, right upper belly pain, yellowing of the eyes or skin sweating swollen lymph nodes weight loss Side effects that usually do not require  medical attention (report to your doctor or health care professional if they continue or are bothersome): decreased appetite hair loss tiredness This list may not describe all possible side effects. Call your doctor for medical advice about side effects. You may report side effects to FDA at 1-800-FDA-1088. Where should I keep my medication? This drug is given in a hospital or clinic and will not be stored at home. NOTE: This sheet is a summary. It may not cover all possible  information. If you have questions about this medicine, talk to your doctor, pharmacist, or health care provider.  2022 Elsevier/Gold Standard (2021-04-20 00:00:00)

## 2021-07-30 NOTE — Progress Notes (Signed)
Patient presents for treatment. RN assessment completed along with the following:  Labs/vitals reviewed - Yes, and within treatment parameters.   Weight within 10% of previous measurement - Yes Oncology Treatment Attestation completed for current therapy- No, message sent to provider regarding need for treatment attestation completion. Informed consent completed and reflects current therapy/intent - Yes, on date 07/30/21             Provider progress note reviewed - Patient not seen by provider today. Most recent note dated 07/27/21 reviewed. Treatment/Antibody/Supportive plan reviewed - Yes, and there are no adjustments needed for today's treatment. S&H and other orders reviewed - Yes, and there are no additional orders identified. Previous treatment date reviewed - Yes, and the appropriate amount of time has elapsed between treatments. Clinic Hand Off Received from - none   Patient to proceed with treatment.

## 2021-08-04 ENCOUNTER — Encounter: Payer: Self-pay | Admitting: Pulmonary Disease

## 2021-08-04 ENCOUNTER — Ambulatory Visit (INDEPENDENT_AMBULATORY_CARE_PROVIDER_SITE_OTHER): Payer: Medicare Other | Admitting: Pulmonary Disease

## 2021-08-04 ENCOUNTER — Other Ambulatory Visit: Payer: Self-pay

## 2021-08-04 VITALS — BP 122/74 | HR 92 | Temp 98.4°F | Ht 73.0 in | Wt 142.2 lb

## 2021-08-04 DIAGNOSIS — R058 Other specified cough: Secondary | ICD-10-CM

## 2021-08-04 DIAGNOSIS — C3491 Malignant neoplasm of unspecified part of right bronchus or lung: Secondary | ICD-10-CM

## 2021-08-04 DIAGNOSIS — J90 Pleural effusion, not elsewhere classified: Secondary | ICD-10-CM

## 2021-08-04 DIAGNOSIS — R911 Solitary pulmonary nodule: Secondary | ICD-10-CM

## 2021-08-04 DIAGNOSIS — R0602 Shortness of breath: Secondary | ICD-10-CM

## 2021-08-04 MED ORDER — STIOLTO RESPIMAT 2.5-2.5 MCG/ACT IN AERS: 2.0000 | INHALATION_SPRAY | Freq: Every day | RESPIRATORY_TRACT | 0 refills | Status: DC

## 2021-08-04 NOTE — Patient Instructions (Addendum)
Thank you for visiting Dr. Valeta Harms at [facility]. Today we recommend the following:  Orders Placed This Encounter  Procedures   Procedural/ Surgical Case Request: ROBOTIC ASSISTED NAVIGATIONAL BRONCHOSCOPY   Procedural/ Surgical Case Request: THORACENTESIS, CHEST TUBE INSERTION   Ambulatory referral to Pulmonology   Bronchoscopy on 08/31/2021 Plans to drain fluid from chest on 08/31/2021  Return in about 5 weeks (around 09/08/2021) for w/ Eric Form, NP .    Please do your part to reduce the spread of COVID-19.

## 2021-08-04 NOTE — Progress Notes (Signed)
Synopsis: Referred in December 2022 for pleural effusion, history of lung cancer by Owens Shark, NP  Subjective:   PATIENT ID: Brian Ramirez GENDER: male DOB: Jun 19, 1946, MRN: [id number]  Chief Complaint  Patient presents with   Consult    This is a 76 year old gentleman, past medical history of congestive heart failure, atrial flutter, history of hiatal hernia, right-sided pleural effusion.  He was diagnosed with lung cancer in February 2020 currently managed by Dr. [name].Patient was last seen in the office by Ned Card, NP from oncology.  Patient has also had some progressive pulmonary densities within the chest on recent CT imaging and was referred for evaluation and consideration for bronchoscopy biopsy and management of pleural effusion.  Patient CT chest was completed on 07/26/2021.  CT scan of the chest reveals progressive change in subsolid lesion of the left lower lobe which is different from his previous diagnosis.  He also has a loculated effusion on the right chest which is presumed malignant.  He also complains today of ongoing cough and sputum production.  Over the past couple of months has had increased shortness of breath.  He did have a thoracentesis that was completed by interventional radiology that made his shortness of breath worse at the time however the fluid that was tested as he states did not have any malignant cells present.   Oncology History  Lung cancer ([place])  09/20/2018 Initial Diagnosis   Lung cancer ([place])   09/21/2018 - 11/28/2018 Chemotherapy   The patient had dexamethasone (DECADRON) 4 MG tablet, 1 of 1 cycle, Start date: --, End date: -- palonosetron (ALOXI) injection 0.25 mg, 0.25 mg, Intravenous,  Once, 4 of 6 cycles Administration: 0.25 mg (09/22/2018), 0.25 mg (11/06/2018), 0.25 mg (11/28/2018), 0.25 mg (10/15/2018) PEMEtrexed (ALIMTA) 1,000 mg in sodium chloride 0.9 % 100 mL chemo infusion, 515 mg/m2 = 975 mg, Intravenous,  Once, 4 of 6 cycles Dose  modification: 515 mg/m2 (original dose 500 mg/m2, Cycle 5, Reason: Other (see comments), Comment: match previous doses) Administration: 1,000 mg (09/22/2018), 1,000 mg (11/06/2018), 1,000 mg (11/28/2018), 1,000 mg (10/15/2018) CARBOplatin (PARAPLATIN) 380 mg in sodium chloride 0.9 % 250 mL chemo infusion, 380 mg (100 % of original dose 377.2 mg), Intravenous,  Once, 4 of 6 cycles Dose modification:   (original dose 377.2 mg, Cycle 1, Reason: Provider Judgment) Administration: 380 mg (09/22/2018), 380 mg (11/06/2018), 380 mg (11/28/2018), 380 mg (10/15/2018)   for chemotherapy treatment.     09/28/2018 -  Chemotherapy   Patient is on Treatment Plan : LUNG Pembrolizumab q42d     01/09/2019 - 04/05/2019 Chemotherapy   The patient had ondansetron (ZOFRAN) 8 mg in sodium chloride 0.9 % 50 mL IVPB, 8 mg (100 % of original dose 8 mg), Intravenous,  Once, 2 of 2 cycles Dose modification: 8 mg (original dose 8 mg, Cycle 4) PEMEtrexed (ALIMTA) 900 mg in sodium chloride 0.9 % 100 mL chemo infusion, 480 mg/m2 = 925 mg, Intravenous,  Once, 6 of 7 cycles Administration: 900 mg (01/09/2019), 900 mg (02/21/2019), 900 mg (03/14/2019), 900 mg (01/31/2019), 900 mg (04/05/2019)   for chemotherapy treatment.        Past Medical History:  Diagnosis Date   Acute congestive heart failure ([place])    Anemia of chronic disease 10/08/2018   Anticoagulated    Atrial flutter with rapid ventricular response ([place]) 10/08/2018   CAD in native artery 06/01/2021   Difficult airway for intubation 09/26/2016   Edema of both lower extremities  10/09/2018   Essential hypertension 06/01/2021   Exertional dyspnea 06/01/2021   Goals of care, counseling/discussion 09/20/2018   Hernia 2012   History of hiatal hernia    History of kidney stones    Left inguinal hernia s/p lap repair 11/22/2016 09/26/2016   Lung cancer ([place]) 09/20/2018   Lung mass 09/04/2018   Malnutrition of moderate degree 09/20/2018   Mass of right lung 09/04/2018   New onset atrial  fibrillation (HCC)    Personal history of kidney stones 1992   Pleural effusion 09/04/2018   Pleural effusion on right    Pressure injury of skin 09/20/2018   SOB (shortness of breath) 09/19/2018     Family History  Problem Relation Age of Onset   Heart disease Father    Cancer Brother      Past Surgical History:  Procedure Laterality Date   HERNIA REPAIR  2012   inguinal   INGUINAL HERNIA REPAIR Left 11/22/2016   Procedure: LAPAROSCOPIC  REPAIR OF LEFT INGUINAL HERNIA WITH MESH;  Surgeon: [name], MD;  Location: [facility];  Service: General;  Laterality: Left;   IR INSTILL VIA CHEST TUBE AGENT FOR FIBRINOLYSIS INI DAY  10/10/2018   IR INSTILL VIA CHEST TUBE AGENT FOR FIBRINOLYSIS INI DAY  10/31/2018   IR PERC PLEURAL DRAIN W/INDWELL CATH W/IMG GUIDE  09/11/2018   IR REMOVAL OF PLURAL CATH W/CUFF  11/13/2018   IR THORACENTESIS ASP PLEURAL SPACE W/IMG GUIDE  07/06/2021    Social History   Socioeconomic History   Marital status: Single    Spouse name: Not on file   Number of children: Not on file   Years of education: Not on file   Highest education level: Not on file  Occupational History   Not on file  Tobacco Use   Smoking status: Never   Smokeless tobacco: Never  Substance and Sexual Activity   Alcohol use: Yes    Alcohol/week: 1.0 standard drink    Types: 1 Glasses of wine per week   Drug use: No   Sexual activity: Not on file  Other Topics Concern   Not on file  Social History Narrative   Not on file   Social Determinants of Health   Financial Resource Strain: Not on file  Food Insecurity: Not on file  Transportation Needs: Not on file  Physical Activity: Not on file  Stress: Not on file  Social Connections: Not on file  Intimate Partner Violence: Not on file     No Known Allergies   Outpatient Medications Prior to Visit  Medication Sig Dispense Refill   Artificial Tear Ointment (DRY EYES OP) Apply 1 drop to eye as needed. Dry, itching eyes     aspirin  EC 81 MG tablet Take 1 tablet (81 mg total) by mouth daily. 90 tablet 3   diphenhydrAMINE HCl (BENADRYL PO) Take 25 mg by mouth as needed.     No facility-administered medications prior to visit.    Review of Systems  Constitutional:  Negative for chills, fever, malaise/fatigue and weight loss.  HENT:  Negative for hearing loss, sore throat and tinnitus.   Eyes:  Negative for blurred vision and double vision.  Respiratory:  Positive for cough, sputum production and shortness of breath. Negative for hemoptysis, wheezing and stridor.   Cardiovascular:  Negative for chest pain, palpitations, orthopnea, leg swelling and PND.  Gastrointestinal:  Negative for abdominal pain, constipation, diarrhea, heartburn, nausea and vomiting.  Genitourinary:  Negative for dysuria, hematuria and  urgency.  Musculoskeletal:  Negative for joint pain and myalgias.  Skin:  Negative for itching and rash.  Neurological:  Negative for dizziness, tingling, weakness and headaches.  Endo/Heme/Allergies:  Negative for environmental allergies. Does not bruise/bleed easily.  Psychiatric/Behavioral:  Negative for depression. The patient is not nervous/anxious and does not have insomnia.   All other systems reviewed and are negative.   Objective:  Physical Exam Vitals reviewed.  Constitutional:      General: He is not in acute distress.    Appearance: He is well-developed.  HENT:     Head: Normocephalic and atraumatic.  Eyes:     General: No scleral icterus.    Conjunctiva/sclera: Conjunctivae normal.     Pupils: Pupils are equal, round, and reactive to light.  Neck:     Vascular: No JVD.     Trachea: No tracheal deviation.  Cardiovascular:     Rate and Rhythm: Normal rate and regular rhythm.     Heart sounds: Normal heart sounds. No murmur heard. Pulmonary:     Effort: Pulmonary effort is normal. No tachypnea, accessory muscle usage or respiratory distress.     Breath sounds: No stridor. No wheezing, rhonchi  or rales.  Abdominal:     General: Bowel sounds are normal. There is no distension.     Palpations: Abdomen is soft.     Tenderness: There is no abdominal tenderness.  Musculoskeletal:        General: No tenderness.     Cervical back: Neck supple.  Lymphadenopathy:     Cervical: No cervical adenopathy.  Skin:    General: Skin is warm and dry.     Capillary Refill: Capillary refill takes less than 2 seconds.     Findings: No rash.  Neurological:     Mental Status: He is alert and oriented to person, place, and time.  Psychiatric:        Behavior: Behavior normal.     Vitals:   08/04/21 1134  BP: 122/74  Pulse: 92  Temp: 98.4 F (36.9 C)  TempSrc: Oral  SpO2: 99%  Weight: 142 lb 3.2 oz (64.5 kg)  Height: 6\' 1"  (1.854 m)   99% on RA BMI Readings from Last 3 Encounters:  08/04/21 18.76 kg/m  07/30/21 19.05 kg/m  07/27/21 19.10 kg/m   Wt Readings from Last 3 Encounters:  08/04/21 142 lb 3.2 oz (64.5 kg)  07/30/21 144 lb 6.4 oz (65.5 kg)  07/27/21 144 lb 12.8 oz (65.7 kg)     CBC    Component Value Date/Time   WBC 8.5 07/30/2021 1124   WBC 2.6 (L) 11/13/2018 1309   RBC 4.60 07/30/2021 1124   HGB 12.2 (L) 07/30/2021 1124   HCT 39.8 07/30/2021 1124   PLT 225 07/30/2021 1124   MCV 86.5 07/30/2021 1124   MCH 26.5 07/30/2021 1124   MCHC 30.7 07/30/2021 1124   RDW 15.1 07/30/2021 1124   LYMPHSABS 0.9 07/30/2021 1124   MONOABS 1.0 07/30/2021 1124   EOSABS 0.5 07/30/2021 1124   BASOSABS 0.0 07/30/2021 1124      Chest Imaging: CT scan of the chest 07/26/2021: Subsolid lesion in the left lower lobe with progressive changes surrounding groundglass concerning for malignancy loculated right-sided pleural effusion with pleural rind.  Pulmonary Functions Testing Results: No flowsheet data found.  FeNO:   Pathology:   Echocardiogram:   Heart Catheterization:     Assessment & Plan:     ICD-10-CM   1. Recurrent right pleural effusion  J90 Ambulatory  referral to Pulmonology    Procedural/ Surgical Case Request: ROBOTIC ASSISTED NAVIGATIONAL BRONCHOSCOPY    CANCELED: Procedural/ Surgical Case Request: THORACENTESIS, CHEST TUBE INSERTION    2. Nodule of lower lobe of left lung  R91.1 Ambulatory referral to Pulmonology    Procedural/ Surgical Case Request: ROBOTIC ASSISTED NAVIGATIONAL BRONCHOSCOPY    3. Sputum production  R05.8     4. Shortness of breath  R06.02     5. Non-small cell cancer of right lung (HCC)  C34.91       Discussion:  This is a 75 year old gentleman, chronic hypoxemic respiratory failure on continuous O2 supplementation, advanced stage non-small cell lung cancer, ongoing cough and sputum production.  He has a progressive ill-defined nodular opacity in the left lower lobe concerning for malignancy, possibly new malignancy in the setting of his ongoing treatment since 2020.  Plan: Oncology would like [place] to consider biopsy of this left lower lobe progressive lesion. We will plan for robotic assisted navigational bronchoscopy and tissue sampling. We talked about the risk benefits and alternatives today in the office. As for his loculated right-sided effusion we will also plan to drain this right effusion while he is asleep. He is agreeable to this plan. We discussed the risk of bleeding and pneumothorax associated with both. Tentative bronchoscopy and pleural drainage plans on 08/31/2021 We appreciate PCC's help with scheduling.  As for his cough and sputum production we will give him samples of Stiolto and a new prescription.  A lot of his cough and sputum production are likely related to his progressive right-sided atelectasis and airway collapse related to this chronic effusion.   Current Outpatient Medications:    Artificial Tear Ointment (DRY EYES OP), Apply 1 drop to eye as needed. Dry, itching eyes, Disp: , Rfl:    aspirin EC 81 MG tablet, Take 1 tablet (81 mg total) by mouth daily., Disp: 90 tablet, Rfl: 3    diphenhydrAMINE HCl (BENADRYL PO), Take 25 mg by mouth as needed., Disp: , Rfl:   I spent 63 minutes dedicated to the care of this patient on the date of this encounter to include pre-visit review of records, face-to-face time with the patient discussing conditions above, post visit ordering of testing, clinical documentation with the electronic health record, making appropriate referrals as documented, and communicating necessary findings to members of the patients care team.   Garner Nash, DO [facility] 08/04/2021 11:56 AM

## 2021-08-04 NOTE — Progress Notes (Deleted)
Synopsis: Referred in *** for *** by Owens Shark, NP  Subjective:   PATIENT ID: Brian Ramirez GENDER: male DOB: [birth year], MRN: [id number]  No chief complaint on file.   HPI  ***  Past Medical History:  Diagnosis Date   Acute congestive heart failure ([place])    Anemia of chronic disease 10/08/2018   Anticoagulated    Atrial flutter with rapid ventricular response ([place]) 10/08/2018   CAD in native artery 06/01/2021   Difficult airway for intubation 09/26/2016   Edema of both lower extremities 10/09/2018   Essential hypertension 06/01/2021   Exertional dyspnea 06/01/2021   Goals of care, counseling/discussion 09/20/2018   Hernia 2012   History of hiatal hernia    History of kidney stones    Left inguinal hernia s/p lap repair 11/22/2016 09/26/2016   Lung cancer ([place]) 09/20/2018   Lung mass 09/04/2018   Malnutrition of moderate degree 09/20/2018   Mass of right lung 09/04/2018   New onset atrial fibrillation (HCC)    Personal history of kidney stones 1992   Pleural effusion 09/04/2018   Pleural effusion on right    Pressure injury of skin 09/20/2018   SOB (shortness of breath) 09/19/2018     Family History  Problem Relation Age of Onset   Heart disease Father    Cancer Brother      Past Surgical History:  Procedure Laterality Date   HERNIA REPAIR  2012   inguinal   INGUINAL HERNIA REPAIR Left 11/22/2016   Procedure: LAPAROSCOPIC  REPAIR OF LEFT INGUINAL HERNIA WITH MESH;  Surgeon: [name], MD;  Location: [facility];  Service: General;  Laterality: Left;   IR INSTILL VIA CHEST TUBE AGENT FOR FIBRINOLYSIS INI DAY  10/10/2018   IR INSTILL VIA CHEST TUBE AGENT FOR FIBRINOLYSIS INI DAY  10/31/2018   IR PERC PLEURAL DRAIN W/INDWELL CATH W/IMG GUIDE  09/11/2018   IR REMOVAL OF PLURAL CATH W/CUFF  11/13/2018   IR THORACENTESIS ASP PLEURAL SPACE W/IMG GUIDE  07/06/2021    Social History   Socioeconomic History   Marital status: Single    Spouse  name: Not on file   Number of children: Not on file   Years of education: Not on file   Highest education level: Not on file  Occupational History   Not on file  Tobacco Use   Smoking status: Never   Smokeless tobacco: Never  Substance and Sexual Activity   Alcohol use: Yes    Alcohol/week: 1.0 standard drink    Types: 1 Glasses of wine per week   Drug use: No   Sexual activity: Not on file  Other Topics Concern   Not on file  Social History Narrative   Not on file   Social Determinants of Health   Financial Resource Strain: Not on file  Food Insecurity: Not on file  Transportation Needs: Not on file  Physical Activity: Not on file  Stress: Not on file  Social Connections: Not on file  Intimate Partner Violence: Not on file     No Known Allergies   Outpatient Medications Prior to Visit  Medication Sig Dispense Refill   Artificial Tear Ointment (DRY EYES OP) Apply 1 drop to eye as needed. Dry, itching eyes     aspirin EC 81 MG tablet Take 1 tablet (81 mg total) by mouth daily. 90 tablet 3   diphenhydrAMINE HCl (BENADRYL PO) Take 25 mg by mouth as needed.     No facility-administered medications prior  to visit.    ROS   Objective:  Physical Exam   There were no vitals filed for this visit.   on *** LPM *** RA BMI Readings from Last 3 Encounters:  07/30/21 19.05 kg/m  07/27/21 19.10 kg/m  07/20/21 18.97 kg/m   Wt Readings from Last 3 Encounters:  07/30/21 144 lb 6.4 oz (65.5 kg)  07/27/21 144 lb 12.8 oz (65.7 kg)  07/20/21 143 lb 12.8 oz (65.2 kg)     CBC    Component Value Date/Time   WBC 8.5 07/30/2021 1124   WBC 2.6 (L) 11/13/2018 1309   RBC 4.60 07/30/2021 1124   HGB 12.2 (L) 07/30/2021 1124   HCT 39.8 07/30/2021 1124   PLT 225 07/30/2021 1124   MCV 86.5 07/30/2021 1124   MCH 26.5 07/30/2021 1124   MCHC 30.7 07/30/2021 1124   RDW 15.1 07/30/2021 1124   LYMPHSABS 0.9 07/30/2021 1124   MONOABS 1.0 07/30/2021 1124   EOSABS  0.5 07/30/2021 1124   BASOSABS 0.0 07/30/2021 1124    ***  Chest Imaging: ***  Pulmonary Functions Testing Results: No flowsheet data found.  FeNO: ***  Pathology: ***  Echocardiogram: ***  Heart Catheterization: ***    Assessment & Plan:   No diagnosis found.  Discussion: ***   Current Outpatient Medications:    Artificial Tear Ointment (DRY EYES OP), Apply 1 drop to eye as needed. Dry, itching eyes, Disp: , Rfl:    aspirin EC 81 MG tablet, Take 1 tablet (81 mg total) by mouth daily., Disp: 90 tablet, Rfl: 3   diphenhydrAMINE HCl (BENADRYL PO), Take 25 mg by mouth as needed., Disp: , Rfl:   I spent *** minutes dedicated to the care of this patient on the date of this encounter to include pre-visit review of records, face-to-face time with the patient discussing conditions above, post visit ordering of testing, clinical documentation with the electronic health record, making appropriate referrals as documented, and communicating necessary findings to members of the patients care team.   Garner Nash, DO [facility] 08/04/2021 9:16 AM

## 2021-08-04 NOTE — Addendum Note (Signed)
Addended by: Fran Lowes on: 08/04/2021 01:34 PM   Modules accepted: Orders

## 2021-08-19 ENCOUNTER — Other Ambulatory Visit: Payer: Self-pay | Admitting: Nurse Practitioner

## 2021-08-20 ENCOUNTER — Other Ambulatory Visit: Payer: Self-pay

## 2021-08-20 ENCOUNTER — Inpatient Hospital Stay: Payer: Medicare Other | Attending: Nurse Practitioner

## 2021-08-20 ENCOUNTER — Inpatient Hospital Stay: Payer: Medicare Other

## 2021-08-20 ENCOUNTER — Telehealth: Payer: Self-pay

## 2021-08-20 ENCOUNTER — Inpatient Hospital Stay (HOSPITAL_BASED_OUTPATIENT_CLINIC_OR_DEPARTMENT_OTHER): Payer: Medicare Other | Admitting: Oncology

## 2021-08-20 VITALS — BP 124/75 | HR 110 | Temp 98.1°F | Resp 20 | Ht 73.0 in | Wt 141.2 lb

## 2021-08-20 VITALS — BP 134/88 | HR 88 | Resp 20

## 2021-08-20 DIAGNOSIS — C771 Secondary and unspecified malignant neoplasm of intrathoracic lymph nodes: Secondary | ICD-10-CM | POA: Insufficient documentation

## 2021-08-20 DIAGNOSIS — C3490 Malignant neoplasm of unspecified part of unspecified bronchus or lung: Secondary | ICD-10-CM

## 2021-08-20 DIAGNOSIS — Z79899 Other long term (current) drug therapy: Secondary | ICD-10-CM | POA: Diagnosis not present

## 2021-08-20 DIAGNOSIS — Z5112 Encounter for antineoplastic immunotherapy: Secondary | ICD-10-CM | POA: Diagnosis not present

## 2021-08-20 DIAGNOSIS — R5383 Other fatigue: Secondary | ICD-10-CM

## 2021-08-20 DIAGNOSIS — J910 Malignant pleural effusion: Secondary | ICD-10-CM | POA: Diagnosis not present

## 2021-08-20 DIAGNOSIS — C3491 Malignant neoplasm of unspecified part of right bronchus or lung: Secondary | ICD-10-CM | POA: Diagnosis present

## 2021-08-20 LAB — CBC WITH DIFFERENTIAL (CANCER CENTER ONLY)
Abs Immature Granulocytes: 0.05 10*3/uL (ref 0.00–0.07)
Basophils Absolute: 0 10*3/uL (ref 0.0–0.1)
Basophils Relative: 0 %
Eosinophils Absolute: 0.6 10*3/uL — ABNORMAL HIGH (ref 0.0–0.5)
Eosinophils Relative: 5 %
HCT: 37 % — ABNORMAL LOW (ref 39.0–52.0)
Hemoglobin: 11.4 g/dL — ABNORMAL LOW (ref 13.0–17.0)
Immature Granulocytes: 0 %
Lymphocytes Relative: 6 %
Lymphs Abs: 0.8 10*3/uL (ref 0.7–4.0)
MCH: 27 pg (ref 26.0–34.0)
MCHC: 30.8 g/dL (ref 30.0–36.0)
MCV: 87.5 fL (ref 80.0–100.0)
Monocytes Absolute: 1.2 10*3/uL — ABNORMAL HIGH (ref 0.1–1.0)
Monocytes Relative: 10 %
Neutro Abs: 9.4 10*3/uL — ABNORMAL HIGH (ref 1.7–7.7)
Neutrophils Relative %: 79 %
Platelet Count: 407 10*3/uL — ABNORMAL HIGH (ref 150–400)
RBC: 4.23 MIL/uL (ref 4.22–5.81)
RDW: 14.7 % (ref 11.5–15.5)
WBC Count: 12 10*3/uL — ABNORMAL HIGH (ref 4.0–10.5)
nRBC: 0 % (ref 0.0–0.2)

## 2021-08-20 LAB — CMP (CANCER CENTER ONLY)
ALT: 14 U/L (ref 0–44)
AST: 20 U/L (ref 15–41)
Albumin: 3.7 g/dL (ref 3.5–5.0)
Alkaline Phosphatase: 88 U/L (ref 38–126)
Anion gap: 13 (ref 5–15)
BUN: 25 mg/dL — ABNORMAL HIGH (ref 8–23)
CO2: 28 mmol/L (ref 22–32)
Calcium: 9.9 mg/dL (ref 8.9–10.3)
Chloride: 95 mmol/L — ABNORMAL LOW (ref 98–111)
Creatinine: 1.38 mg/dL — ABNORMAL HIGH (ref 0.61–1.24)
GFR, Estimated: 54 mL/min — ABNORMAL LOW (ref >60)
Glucose, Bld: 105 mg/dL — ABNORMAL HIGH (ref 70–99)
Potassium: 3.8 mmol/L (ref 3.5–5.1)
Sodium: 136 mmol/L (ref 135–145)
Total Bilirubin: 0.4 mg/dL (ref 0.3–1.2)
Total Protein: 8.2 g/dL — ABNORMAL HIGH (ref 6.5–8.1)

## 2021-08-20 LAB — TSH: TSH: 2.968 u[IU]/mL (ref 0.350–4.500)

## 2021-08-20 MED ORDER — SODIUM CHLORIDE 0.9 % IV SOLN
200.0000 mg | Freq: Once | INTRAVENOUS | Status: AC
  Administered 2021-08-20: 200 mg via INTRAVENOUS
  Filled 2021-08-20: qty 8

## 2021-08-20 MED ORDER — SODIUM CHLORIDE 0.9 % IV SOLN: Freq: Once | INTRAVENOUS | Status: AC

## 2021-08-20 NOTE — Patient Instructions (Signed)
[facility]   Discharge Instructions: Thank you for choosing [facility] to provide your oncology and hematology care.   If you have a lab appointment with the [facility], please go directly to the [facility] and check in at the registration area.   Wear comfortable clothing and clothing appropriate for easy access to any Portacath or PICC line.   We strive to give you quality time with your provider. You may need to reschedule your appointment if you arrive late (15 or more minutes).  Arriving late affects you and other patients whose appointments are after yours.  Also, if you miss three or more appointments without notifying the office, you may be dismissed from the clinic at the providers discretion.      For prescription refill requests, have your pharmacy contact our office and allow 72 hours for refills to be completed.    Today you received the following chemotherapy and/or immunotherapy agents Keytruda      To help prevent nausea and vomiting after your treatment, we encourage you to take your nausea medication as directed.  BELOW ARE SYMPTOMS THAT SHOULD BE REPORTED IMMEDIATELY: *FEVER GREATER THAN 100.4 F (38 C) OR HIGHER *CHILLS OR SWEATING *NAUSEA AND VOMITING THAT IS NOT CONTROLLED WITH YOUR NAUSEA MEDICATION *UNUSUAL SHORTNESS OF BREATH *UNUSUAL BRUISING OR BLEEDING *URINARY PROBLEMS (pain or burning when urinating, or frequent urination) *BOWEL PROBLEMS (unusual diarrhea, constipation, pain near the anus) TENDERNESS IN MOUTH AND THROAT WITH OR WITHOUT PRESENCE OF ULCERS (sore throat, sores in mouth, or a toothache) UNUSUAL RASH, SWELLING OR PAIN  UNUSUAL VAGINAL DISCHARGE OR ITCHING   Items with * indicate a potential emergency and should be followed up as soon as possible or go to the Emergency Department if any problems should occur.  Please show the CHEMOTHERAPY ALERT CARD or IMMUNOTHERAPY ALERT CARD at check-in to the  Emergency Department and triage nurse.  Should you have questions after your visit or need to cancel or reschedule your appointment, please contact [facility]  Dept: [phone]  and follow the prompts.  Office hours are 8:00 a.m. to 4:30 p.m. Monday - Friday. Please note that voicemails left after 4:00 p.m. may not be returned until the following business day.  We are closed weekends and major holidays. You have access to a nurse at all times for urgent questions. Please call the main number to the clinic Dept: [phone] and follow the prompts.   For any non-urgent questions, you may also contact your provider using MyChart. We now offer e-Visits for anyone 20 and older to request care online for non-urgent symptoms. For details visit [email or web address].   Also download the MyChart app! Go to the app store, search "MyChart", open the app, select [facility], and log in with your MyChart username and password.  Due to Covid, a mask is required upon entering the hospital/clinic. If you do not have a mask, one will be given to you upon arrival. For doctor visits, patients may have 1 support person aged 56 or older with them. For treatment visits, patients cannot have anyone with them due to current Covid guidelines and our immunocompromised population.   Pembrolizumab injection What is this medication? PEMBROLIZUMAB (pem broe liz ue mab) is a monoclonal antibody. It is used to treat certain types of cancer. This medicine may be used for other purposes; ask your health care provider or pharmacist if you have questions. COMMON BRAND NAME(S): [employer]  What should I tell my care team before I take this medication? They need to know if you have any of these conditions: autoimmune diseases like Crohn's disease, ulcerative colitis, or lupus have had or planning to have an allogeneic stem cell transplant (uses someone else's stem cells) history of organ  transplant history of chest radiation nervous system problems like myasthenia gravis or Guillain-Barre syndrome an unusual or allergic reaction to pembrolizumab, other medicines, foods, dyes, or preservatives pregnant or trying to get pregnant breast-feeding How should I use this medication? This medicine is for infusion into a vein. It is given by a health care professional in a hospital or clinic setting. A special MedGuide will be given to you before each treatment. Be sure to read this information carefully each time. Talk to your pediatrician regarding the use of this medicine in children. While this drug may be prescribed for children as young as 6 months for selected conditions, precautions do apply. Overdosage: If you think you have taken too much of this medicine contact a poison control center or emergency room at once. NOTE: This medicine is only for you. Do not share this medicine with others. What if I miss a dose? It is important not to miss your dose. Call your doctor or health care professional if you are unable to keep an appointment. What may interact with this medication? Interactions have not been studied. This list may not describe all possible interactions. Give your health care provider a list of all the medicines, herbs, non-prescription drugs, or dietary supplements you use. Also tell them if you smoke, drink alcohol, or use illegal drugs. Some items may interact with your medicine. What should I watch for while using this medication? Your condition will be monitored carefully while you are receiving this medicine. You may need blood work done while you are taking this medicine. Do not become pregnant while taking this medicine or for 4 months after stopping it. Women should inform their doctor if they wish to become pregnant or think they might be pregnant. There is a potential for serious side effects to an unborn child. Talk to your health care professional or  pharmacist for more information. Do not breast-feed an infant while taking this medicine or for 4 months after the last dose. What side effects may I notice from receiving this medication? Side effects that you should report to your doctor or health care professional as soon as possible: allergic reactions like skin rash, itching or hives, swelling of the face, lips, or tongue bloody or black, tarry breathing problems changes in vision chest pain chills confusion constipation cough diarrhea dizziness or feeling faint or lightheaded fast or irregular heartbeat fever flushing joint pain low blood counts - this medicine may decrease the number of white blood cells, red blood cells and platelets. You may be at increased risk for infections and bleeding. muscle pain muscle weakness pain, tingling, numbness in the hands or feet persistent headache redness, blistering, peeling or loosening of the skin, including inside the mouth signs and symptoms of high blood sugar such as dizziness; dry mouth; dry skin; fruity breath; nausea; stomach pain; increased hunger or thirst; increased urination signs and symptoms of kidney injury like trouble passing urine or change in the amount of urine signs and symptoms of liver injury like dark urine, light-colored stools, loss of appetite, nausea, right upper belly pain, yellowing of the eyes or skin sweating swollen lymph nodes weight loss Side effects that usually do not require  medical attention (report to your doctor or health care professional if they continue or are bothersome): decreased appetite hair loss tiredness This list may not describe all possible side effects. Call your doctor for medical advice about side effects. You may report side effects to FDA at 1-800-FDA-1088. Where should I keep my medication? This drug is given in a hospital or clinic and will not be stored at home. NOTE: This sheet is a summary. It may not cover all possible  information. If you have questions about this medicine, talk to your doctor, pharmacist, or health care provider.  2022 Elsevier/Gold Standard (2021-04-20 00:00:00)

## 2021-08-20 NOTE — Progress Notes (Signed)
[facility] OFFICE PROGRESS NOTE   Diagnosis: Non-small cell lung cancer  INTERVAL HISTORY:   Brian Ramirez returns as scheduled.  He resumed treatment with pembrolizumab on 07/30/2021.  He reports increased exertional dyspnea.  He is using oxygen all the time.  He is able to sleep well at night.  He has an intermittent cough productive of clear sputum.  No pain.  Objective:  Vital signs in last 24 hours:  Blood pressure 124/75, pulse (!) 110, temperature 98.1 F (36.7 C), temperature source Oral, resp. rate 20, height _0  (1.854 m), weight 141 lb 3.2 oz (64 kg), SpO2 95 %.    Lymphatics: No cervical or supraclavicular nodes Resp: Decreased breath sounds at the right lower chest, inspiratory rales at the left anterior chest Cardio: Irregular GI: No hepatosplenomegaly Vascular: No leg edema   Lab Results:  Lab Results  Component Value Date   WBC 12.0 (H) 08/20/2021   HGB 11.4 (L) 08/20/2021   HCT 37.0 (L) 08/20/2021   MCV 87.5 08/20/2021   PLT 407 (H) 08/20/2021   NEUTROABS 9.4 (H) 08/20/2021    CMP  Lab Results  Component Value Date   NA 137 07/30/2021   K 4.3 07/30/2021   CL 101 07/30/2021   CO2 28 07/30/2021   GLUCOSE 104 (H) 07/30/2021   BUN 26 (H) 07/30/2021   CREATININE 1.32 (H) 07/30/2021   CALCIUM 9.6 07/30/2021   PROT 8.0 07/30/2021   ALBUMIN 3.9 07/30/2021   AST 18 07/30/2021   ALT 10 07/30/2021   ALKPHOS 84 07/30/2021   BILITOT 0.4 07/30/2021   GFRNONAA 57 (L) 07/30/2021   GFRAA 46 (L) 05/08/2020    Medications: I have reviewed the patient's current medications.   Assessment/Plan:  Metastatic non-small cell lung cancer  large right pleural effusion, right lung mass, right pleural-based masses, left lung nodules Right thoracentesis 09/04/2018-negative cytology CT biopsy of right lateral pleural mass 09/07/2018- poorly differentiated non-small cell carcinoma, malignant cells positive for cytokeratin AE1/AE3 and cytokeratin 8/18,  histology favoring adenocarcinoma, MSS, tumor mutation burden 5.no EGFR, ALK, BRAF alteration.  PDL 1 rearrangement PDL 1 tumor proportion score-100%  Cycle 1 Alimta/carboplatin 09/22/2018, pembrolizumab 09/28/2018 Cycle 2 Alimta/carboplatin/pembrolizumab 10/15/2018 Cycle 3 Alimta/carboplatin/pembrolizumab 11/06/2018 Cycle 4 Alimta/carboplatin/pembrolizumab 11/28/2018 CT chest 12/14/2018- response to therapy of lung primary/primaries, thoracic nodal and pulmonary metastasis.  Decrease in right pleural effusion with improvement in pleural-based metastasis.  New T10 heterogeneous sclerotic density likely due to healing of previously CT occult metastasis. Pembrolizumab 12/19/2018 Pembrolizumab/Alimta starting 01/09/2019 Pembrolizumab/Alimta 01/31/2019 Pembrolizumab/Alimta 02/21/2019 Pembrolizumab/Alimta 03/14/2019 CT 04/01/2019- no evidence of disease progression, small loculated right pleural effusion and right basilar pleural nodularity-stable, stable left lower lobe nodule, persistent sclerosis at T10 Pembrolizumab/Alimta 04/05/2019 Pembrolizumab 05/07/2019 Pembrolizumab 05/28/2019 Pembrolizumab 06/18/2019 Pembrolizumab 07/09/2019 Chest CT 07/26/2019-moderate to large stable exudative right effusion with considerable pleural thickening.  Nodularity at the right lung base along pleural thickening similar to prior.  Stable left paramediastinal groundglass density nodule.  Stable sclerotic lesion posteriorly at the T10 vertebral level. Pembrolizumab 07/30/2019 Pembrolizumab 08/21/2019-changed to every 6-week dosing Pembrolizumab 10/11/2019 Pembrolizumab 11/22/2019 CT chest 01/03/2020-chronic appearing loculated effusion right chest with signs of pleural thickening smaller than on prior exam; similar appearance of tree-in-bud bud nodularity in the right upper lobe and left lung apex along with bronchiectatic changes in the lingula and middle lobe; similar appearance of sclerosis T10 vertebral body Pembrolizumab  01/03/2020 Pembrolizumab 02/14/2020 Pembrolizumab 03/27/2020 Chest CT 05/06/2020-unchanged posttreatment appearance of the chest with a chronic, loculated moderate right  pleural effusion with associated atelectasis or consolidation and pleural thickening.  Stable small pulmonary nodules measuring 3 mm or smaller.  Pembrolizumab 05/08/2020 Pembrolizumab 06/19/2020 Pembrolizumab 08/03/2020 Pembrolizumab 09/14/2020 CT chest 11/04/2020-persistent and enlarging complex loculated right pleural fluid collection.  No findings suspicious for recurrent tumor, mediastinal/hilar adenopathy or pulmonary metastatic disease. CT chest 02/04/2021-chronic loculated right pleural effusion, new ill-defined right upper lobe nodule-nonspecific, coronary and aortic valve calcifications  (next scan planned at a 21-monthinterval) CT chest 05/21/2021-substantial increase peripheral airspace opacity in the right upper lobe and superior segment right lower lobe.  Appearance suspicious for multi lobar pneumonia.  Stable thick-walled chronic large right pleural effusion.  New part solid 1.2 cm superior segment left lower lobe nodule.  Stable left upper lobe groundglass density pulmonary nodule.  Increased bandlike nodular opacity laterally in the left lower lobe. 10-day course of Levaquin   Chest x-ray 06/09/2021-new patchy airspace disease left upper lobe, right upper lobe airspace disease has minimally decreased but not resolved.  Stable loculated right pleural effusion Thoracentesis 07/06/2021-350 cc of thick bloody fluid removed.  Cytology with atypical cells present.  Culture with no growth. CT chest 07/26/2021-progressive ill-defined nodular airspace process in the left lower lobe with hazy surrounding groundglass opacity.  Significant progression of ill-defined groundglass opacity and slight interstitial thickening left upper lobe.  Improved appearance right upper lobe.  Stable thick-walled loculated right pleural fluid collection.   New small left pleural effusion.  Stable underlying lung disease.  No mediastinal or hilar mass or adenopathy. Pembrolizumab 07/30/2021 Pembrolizumab 08/20/2021   2.  Severe anemia-likely secondary to bleeding in the right pleural space and metastatic carcinoma, red cell transfusions 09/09/2018 09/10/2018, 09/23/2018-improved; progressive 04/26/2019, red cell transfusion.  Improved. 3.  Dyspnea secondary to the large right pleural effusion and anemia Right Pleurx catheter placed 09/11/2018 Chest x-ray 10/09/2018- large right pleural effusion, loculated Chest x-ray 11/06/2018- no interval change in the right-sided pleural effusion Pleurx drainage catheter removed 11/13/2018 4.  History of tobacco use 5.  History of kidney stones 6.  Fever-most likely tumor fever, improved 7.  Admission 10/08/2018 with rapid atrial fibrillation/flutter- improved with diltiazem, started on Eliquis anticoagulation.  Eliquis discontinued.  Now on aspirin. 8.  Renal insufficiency   Disposition: Brian Ramirez a history of non-small cell lung cancer.  He has increased dyspnea.  A CT last month revealed evidence of disease progression.  Pembrolizumab was resumed on 07/30/2021.  He will complete another treatment today.  He saw Dr. IValeta Harmsand is scheduled for bronchoscopy and pleural drainage.  I am concerned he is developing lymphatic tumor spread in the lungs.  He will call for increased dyspnea.  I recommended he go to the emergency room for dyspnea not relieved with home oxygen therapy.  He will return for an office visit and pembrolizumab on 09/13/2021.  We will see him sooner as needed.  We will consider resuming systemic chemotherapy based on the bronchoscopy findings and his clinical status over the next month.  GBetsy Coder MD  08/20/2021  8:53 AM

## 2021-08-20 NOTE — Progress Notes (Signed)
Patient presents for treatment. RN assessment completed along with the following:  Labs/vitals reviewed - Yes, and within treatment parameters. Weight within 10% of previous measurement - Yes Oncology Treatment Attestation completed for current therapy- Yes, on date 05/06/21 Informed consent completed and reflects current therapy/intent - Yes, on date 05/06/21             Provider progress note reviewed - Yes, today's provider note was reviewed. Treatment/Antibody/Supportive plan reviewed - Yes, and there are no adjustments needed for today's treatment. S&H and other orders reviewed - Yes, and there are no additional orders identified. Previous treatment date reviewed - Yes, and the appropriate amount of time has elapsed between treatments.   Patient to proceed with treatment.

## 2021-08-20 NOTE — Telephone Encounter (Signed)
Patient seen by Dr. [name] today  Vitals are are all within parameters  Labs reviewed by Dr. [name] and are within treatment parameters.  Per physician team, patient is ready for treatment and there are NO modifications to the treatment plan.

## 2021-08-25 ENCOUNTER — Other Ambulatory Visit: Payer: Self-pay

## 2021-08-25 ENCOUNTER — Inpatient Hospital Stay (HOSPITAL_BASED_OUTPATIENT_CLINIC_OR_DEPARTMENT_OTHER)
Admission: EM | Admit: 2021-08-25 | Discharge: 2021-08-31 | DRG: 166 | Disposition: A | Discharge status: Home / Self Care | Payer: Medicare Other | Attending: Internal Medicine | Admitting: Internal Medicine

## 2021-08-25 ENCOUNTER — Encounter (HOSPITAL_BASED_OUTPATIENT_CLINIC_OR_DEPARTMENT_OTHER): Payer: Self-pay | Admitting: *Deleted

## 2021-08-25 DIAGNOSIS — I4891 Unspecified atrial fibrillation: Secondary | ICD-10-CM | POA: Diagnosis not present

## 2021-08-25 DIAGNOSIS — J189 Pneumonia, unspecified organism: Principal | ICD-10-CM | POA: Diagnosis present

## 2021-08-25 DIAGNOSIS — R0600 Dyspnea, unspecified: Secondary | ICD-10-CM | POA: Diagnosis present

## 2021-08-25 DIAGNOSIS — Z8249 Family history of ischemic heart disease and other diseases of the circulatory system: Secondary | ICD-10-CM

## 2021-08-25 DIAGNOSIS — R918 Other nonspecific abnormal finding of lung field: Secondary | ICD-10-CM | POA: Diagnosis not present

## 2021-08-25 DIAGNOSIS — Z9221 Personal history of antineoplastic chemotherapy: Secondary | ICD-10-CM

## 2021-08-25 DIAGNOSIS — D630 Anemia in neoplastic disease: Secondary | ICD-10-CM | POA: Diagnosis present

## 2021-08-25 DIAGNOSIS — Z87891 Personal history of nicotine dependence: Secondary | ICD-10-CM | POA: Diagnosis not present

## 2021-08-25 DIAGNOSIS — Z20822 Contact with and (suspected) exposure to covid-19: Secondary | ICD-10-CM | POA: Diagnosis not present

## 2021-08-25 DIAGNOSIS — D631 Anemia in chronic kidney disease: Secondary | ICD-10-CM | POA: Diagnosis present

## 2021-08-25 DIAGNOSIS — Z809 Family history of malignant neoplasm, unspecified: Secondary | ICD-10-CM | POA: Diagnosis not present

## 2021-08-25 DIAGNOSIS — Z681 Body mass index (BMI) 19 or less, adult: Secondary | ICD-10-CM

## 2021-08-25 DIAGNOSIS — N182 Chronic kidney disease, stage 2 (mild): Secondary | ICD-10-CM | POA: Diagnosis not present

## 2021-08-25 DIAGNOSIS — Z419 Encounter for procedure for purposes other than remedying health state, unspecified: Secondary | ICD-10-CM

## 2021-08-25 DIAGNOSIS — H04129 Dry eye syndrome of unspecified lacrimal gland: Secondary | ICD-10-CM | POA: Diagnosis present

## 2021-08-25 DIAGNOSIS — Z9889 Other specified postprocedural states: Secondary | ICD-10-CM

## 2021-08-25 DIAGNOSIS — I5032 Chronic diastolic (congestive) heart failure: Secondary | ICD-10-CM | POA: Diagnosis present

## 2021-08-25 DIAGNOSIS — J9621 Acute and chronic respiratory failure with hypoxia: Secondary | ICD-10-CM | POA: Diagnosis present

## 2021-08-25 DIAGNOSIS — N179 Acute kidney failure, unspecified: Secondary | ICD-10-CM | POA: Diagnosis not present

## 2021-08-25 DIAGNOSIS — Z01811 Encounter for preprocedural respiratory examination: Secondary | ICD-10-CM | POA: Diagnosis not present

## 2021-08-25 DIAGNOSIS — J168 Pneumonia due to other specified infectious organisms: Secondary | ICD-10-CM | POA: Diagnosis not present

## 2021-08-25 DIAGNOSIS — C3430 Malignant neoplasm of lower lobe, unspecified bronchus or lung: Secondary | ICD-10-CM | POA: Diagnosis not present

## 2021-08-25 DIAGNOSIS — J910 Malignant pleural effusion: Secondary | ICD-10-CM | POA: Diagnosis present

## 2021-08-25 DIAGNOSIS — C782 Secondary malignant neoplasm of pleura: Secondary | ICD-10-CM | POA: Diagnosis present

## 2021-08-25 DIAGNOSIS — E440 Moderate protein-calorie malnutrition: Secondary | ICD-10-CM | POA: Diagnosis not present

## 2021-08-25 DIAGNOSIS — R0602 Shortness of breath: Secondary | ICD-10-CM | POA: Diagnosis not present

## 2021-08-25 DIAGNOSIS — K449 Diaphragmatic hernia without obstruction or gangrene: Secondary | ICD-10-CM | POA: Diagnosis present

## 2021-08-25 DIAGNOSIS — R911 Solitary pulmonary nodule: Secondary | ICD-10-CM

## 2021-08-25 DIAGNOSIS — I130 Hypertensive heart and chronic kidney disease with heart failure and stage 1 through stage 4 chronic kidney disease, or unspecified chronic kidney disease: Secondary | ICD-10-CM | POA: Diagnosis not present

## 2021-08-25 DIAGNOSIS — M2609 Other specified anomalies of jaw size: Secondary | ICD-10-CM | POA: Diagnosis present

## 2021-08-25 DIAGNOSIS — I2510 Atherosclerotic heart disease of native coronary artery without angina pectoris: Secondary | ICD-10-CM | POA: Diagnosis not present

## 2021-08-25 DIAGNOSIS — J90 Pleural effusion, not elsewhere classified: Secondary | ICD-10-CM | POA: Insufficient documentation

## 2021-08-25 DIAGNOSIS — Z9981 Dependence on supplemental oxygen: Secondary | ICD-10-CM

## 2021-08-25 DIAGNOSIS — J9601 Acute respiratory failure with hypoxia: Secondary | ICD-10-CM | POA: Diagnosis not present

## 2021-08-25 DIAGNOSIS — C3412 Malignant neoplasm of upper lobe, left bronchus or lung: Secondary | ICD-10-CM | POA: Diagnosis present

## 2021-08-25 DIAGNOSIS — C3490 Malignant neoplasm of unspecified part of unspecified bronchus or lung: Secondary | ICD-10-CM | POA: Diagnosis not present

## 2021-08-25 DIAGNOSIS — Z87442 Personal history of urinary calculi: Secondary | ICD-10-CM

## 2021-08-25 DIAGNOSIS — R5381 Other malaise: Secondary | ICD-10-CM | POA: Diagnosis not present

## 2021-08-25 DIAGNOSIS — Z7982 Long term (current) use of aspirin: Secondary | ICD-10-CM

## 2021-08-25 DIAGNOSIS — I509 Heart failure, unspecified: Secondary | ICD-10-CM | POA: Diagnosis not present

## 2021-08-25 DIAGNOSIS — Z602 Problems related to living alone: Secondary | ICD-10-CM | POA: Diagnosis present

## 2021-08-25 DIAGNOSIS — D638 Anemia in other chronic diseases classified elsewhere: Secondary | ICD-10-CM | POA: Diagnosis not present

## 2021-08-25 DIAGNOSIS — I110 Hypertensive heart disease with heart failure: Secondary | ICD-10-CM | POA: Diagnosis not present

## 2021-08-25 DIAGNOSIS — R042 Hemoptysis: Secondary | ICD-10-CM | POA: Diagnosis not present

## 2021-08-25 DIAGNOSIS — J9811 Atelectasis: Secondary | ICD-10-CM | POA: Diagnosis not present

## 2021-08-25 DIAGNOSIS — I4892 Unspecified atrial flutter: Secondary | ICD-10-CM | POA: Diagnosis present

## 2021-08-25 DIAGNOSIS — I482 Chronic atrial fibrillation, unspecified: Secondary | ICD-10-CM | POA: Diagnosis not present

## 2021-08-25 HISTORY — DX: Failed or difficult intubation, initial encounter: T88.4XXA

## 2021-08-25 NOTE — ED Provider Notes (Signed)
[facility] EMERGENCY DEPT Provider Note   CSN: [id number] Arrival date & time: 08/25/21  2258     History  Chief Complaint  Patient presents with   Shortness of Breath    Brian Ramirez is a 76 y.o. male.  Patient is a 76 year old male with past medical history of lung cancer status post chemotherapy, atrial flutter, congestive heart failure.  Patient has also had a pleural effusion in the past which required what sounds like a Pleurx catheter.  This has since been removed and patient has been doing well for the past year.  Over the past several weeks, he has had worsening dyspnea on exertion.  CT scans have shown a loculated right-sided pleural effusion and patient is scheduled for a procedure to have this drained on the 16th.  He presents today with worsening shortness of breath.  He describes having difficulty standing and ambulating a few steps before he becomes short of breath, lightheaded, and has to sit down and rest.  He denies to me he is having any fevers or chills.  He denies chest pain but does report some productive cough worsening over the past week.  He denies any leg swelling or calf pain.  The history is provided by the patient.  Shortness of Breath Severity:  Moderate Onset quality:  Gradual Duration:  6 weeks Timing:  Constant Progression:  Worsening Chronicity:  Recurrent Context: activity   Context: not URI   Relieved by:  Rest Worsened by:  Exertion Ineffective treatments:  None tried     Home Medications Prior to Admission medications   Medication Sig Start Date End Date Taking? Authorizing Provider  Artificial Tear Ointment (DRY EYES OP) Apply 1 drop to eye as needed (Dry, itching eyes).    [provider]  aspirin EC 81 MG tablet Take 1 tablet (81 mg total) by mouth daily. 12/03/18   Dorothy Spark, MD  Tiotropium Bromide-Olodaterol (STIOLTO RESPIMAT) 2.5-2.5 MCG/ACT AERS Inhale 2 puffs into the lungs daily. Patient not  taking: Reported on 08/23/2021 08/04/21   Garner Nash, DO      Allergies    Patient has no known allergies.    Review of Systems   Review of Systems  Respiratory:  Positive for shortness of breath.   All other systems reviewed and are negative.  Physical Exam Updated Vital Signs BP 110/73 (BP Location: Left Arm)    Pulse (!) 108    Temp 98.3 F (36.8 C)    Resp (!) 22    Ht 6\' 1"  (1.854 m)    Wt 64 kg    SpO2 99%    BMI 18.60 kg/m  Physical Exam Vitals and nursing note reviewed.  Constitutional:      General: He is not in acute distress.    Appearance: He is well-developed. He is not diaphoretic.  HENT:     Head: Normocephalic and atraumatic.  Cardiovascular:     Rate and Rhythm: Normal rate and regular rhythm.     Heart sounds: No murmur heard.   No friction rub.  Pulmonary:     Effort: No respiratory distress.     Breath sounds: No wheezing or rales.     Comments: Patient does have some tachypnea and breath sounds are decreased on the right. Abdominal:     General: Bowel sounds are normal. There is no distension.     Palpations: Abdomen is soft.     Tenderness: There is no abdominal tenderness.  Musculoskeletal:  General: Normal range of motion.     Cervical back: Normal range of motion and neck supple.     Right lower leg: No tenderness. No edema.     Left lower leg: No tenderness. No edema.  Skin:    General: Skin is warm and dry.  Neurological:     Mental Status: He is alert and oriented to person, place, and time.     Coordination: Coordination normal.    ED Results / Procedures / Treatments   Labs (all labs ordered are listed, but only abnormal results are displayed) Labs Reviewed  RESP PANEL BY RT-PCR (FLU A&B, COVID) [id number]    EKG ED ECG REPORT   Date: 08/26/2021  Rate: 108  Rhythm:  Sinus Tachycardia with arrhythmia  QRS Axis: normal  Intervals: normal  ST/T Wave abnormalities: normal  Conduction Disutrbances:none  Narrative  Interpretation:   Old EKG Reviewed: unchanged  I have personally reviewed the EKG tracing and agree with the computerized printout as noted.     Radiology No results found.  Procedures Procedures  Continuous cardiac monitoring  Medications Ordered in ED Medications - No data to display  ED Course/ Medical Decision Making/ A&P  This patient presents to the ED for concern of shortness of breath, this involves an extensive number of treatment options, and is a complaint that carries with it a high risk of complications and morbidity.  The differential diagnosis includes pulmonary embolism, pneumonia, increasing pleural effusion   Co morbidities that complicate the patient evaluation  Lung cancer with history of pleural effusion on the right   Additional history obtained:  Additional history obtained from son at bedside No external records reviewed or needed   Lab Tests:  I Ordered, and personally interpreted labs.  The pertinent results include: CBC, metabolic panel, BNP, and troponin.  The's are all essentially unremarkable   Imaging Studies ordered:  I ordered imaging studies including CT scan of the chest I independently visualized and interpreted imaging which showed diffuse infiltrate to the left upper lobe and lesser degree right upper lobe I agree with the radiologist interpretation   Cardiac Monitoring:  The patient was maintained on a cardiac monitor.  I personally viewed and interpreted the cardiac monitored which showed an underlying rhythm of: Sinus rhythm   Medicines ordered and prescription drug management:  I ordered medication including vancomycin and cefepime for healthcare acquired pneumonia Reevaluation of the patient after these medicines showed that the patient stayed the same I have reviewed the patients home medicines and have made adjustments as needed   Test Considered:  No other test considered or ordered.  He may require  thoracentesis during this hospitalization   Critical Interventions:  IV antibiotics   Consultations Obtained:  I requested consultation with the hospitalist Dr. Cyd Silence,  and discussed lab and imaging findings as well as pertinent plan - they recommend: Admission for IV antibiotics   Problem List / ED Course:  Patient with history of lung cancer presenting with shortness of breath.  He has a history of a known right-sided pleural effusion, but today also has an infiltrate of the left upper lobe.  He does report some productive cough recently.  Patient given broad-spectrum antibiotics and I feel will require admission.  I have spoken with the hospitalist who agrees to admit.   Reevaluation:  After the interventions noted above, I reevaluated the patient and found that they have :stayed the same   Social Determinants of Health:  None  Dispostion:  After consideration of the diagnostic results and the patients response to treatment, I feel that the patent would benefit from admission for IV antibiotics.  CRITICAL CARE Performed by: Veryl Speak Total critical care time: 35 minutes Critical care time was exclusive of separately billable procedures and treating other patients. Critical care was necessary to treat or prevent imminent or life-threatening deterioration. Critical care was time spent personally by me on the following activities: development of treatment plan with patient and/or surrogate as well as nursing, discussions with consultants, evaluation of patient's response to treatment, examination of patient, obtaining history from patient or surrogate, ordering and performing treatments and interventions, ordering and review of laboratory studies, ordering and review of radiographic studies, pulse oximetry and re-evaluation of patient's condition.     Final Clinical Impression(s) / ED Diagnoses Final diagnoses:  None    Rx / DC Orders ED Discharge Orders      None         Veryl Speak, MD 08/26/21 [phone]

## 2021-08-25 NOTE — ED Triage Notes (Signed)
Pt c/o sob that has gotten worse over the last couple of weeks; pt states he coughs up mucous all the time anyway

## 2021-08-26 ENCOUNTER — Encounter: Payer: Self-pay | Admitting: Oncology

## 2021-08-26 ENCOUNTER — Emergency Department (HOSPITAL_BASED_OUTPATIENT_CLINIC_OR_DEPARTMENT_OTHER): Payer: Medicare Other

## 2021-08-26 DIAGNOSIS — C782 Secondary malignant neoplasm of pleura: Secondary | ICD-10-CM | POA: Diagnosis present

## 2021-08-26 DIAGNOSIS — N179 Acute kidney failure, unspecified: Secondary | ICD-10-CM | POA: Diagnosis present

## 2021-08-26 DIAGNOSIS — D631 Anemia in chronic kidney disease: Secondary | ICD-10-CM | POA: Diagnosis present

## 2021-08-26 DIAGNOSIS — R042 Hemoptysis: Secondary | ICD-10-CM | POA: Diagnosis not present

## 2021-08-26 DIAGNOSIS — Z20822 Contact with and (suspected) exposure to covid-19: Secondary | ICD-10-CM | POA: Diagnosis present

## 2021-08-26 DIAGNOSIS — I130 Hypertensive heart and chronic kidney disease with heart failure and stage 1 through stage 4 chronic kidney disease, or unspecified chronic kidney disease: Secondary | ICD-10-CM | POA: Diagnosis present

## 2021-08-26 DIAGNOSIS — R0600 Dyspnea, unspecified: Secondary | ICD-10-CM | POA: Diagnosis not present

## 2021-08-26 DIAGNOSIS — C3412 Malignant neoplasm of upper lobe, left bronchus or lung: Secondary | ICD-10-CM | POA: Diagnosis present

## 2021-08-26 DIAGNOSIS — I2510 Atherosclerotic heart disease of native coronary artery without angina pectoris: Secondary | ICD-10-CM

## 2021-08-26 DIAGNOSIS — I110 Hypertensive heart disease with heart failure: Secondary | ICD-10-CM | POA: Diagnosis not present

## 2021-08-26 DIAGNOSIS — I509 Heart failure, unspecified: Secondary | ICD-10-CM | POA: Diagnosis not present

## 2021-08-26 DIAGNOSIS — E440 Moderate protein-calorie malnutrition: Secondary | ICD-10-CM | POA: Diagnosis present

## 2021-08-26 DIAGNOSIS — J9621 Acute and chronic respiratory failure with hypoxia: Secondary | ICD-10-CM | POA: Diagnosis present

## 2021-08-26 DIAGNOSIS — R5381 Other malaise: Secondary | ICD-10-CM | POA: Diagnosis not present

## 2021-08-26 DIAGNOSIS — R918 Other nonspecific abnormal finding of lung field: Secondary | ICD-10-CM | POA: Diagnosis not present

## 2021-08-26 DIAGNOSIS — D638 Anemia in other chronic diseases classified elsewhere: Secondary | ICD-10-CM

## 2021-08-26 DIAGNOSIS — I482 Chronic atrial fibrillation, unspecified: Secondary | ICD-10-CM | POA: Diagnosis not present

## 2021-08-26 DIAGNOSIS — R0602 Shortness of breath: Secondary | ICD-10-CM | POA: Diagnosis not present

## 2021-08-26 DIAGNOSIS — Z87891 Personal history of nicotine dependence: Secondary | ICD-10-CM | POA: Diagnosis not present

## 2021-08-26 DIAGNOSIS — Z01811 Encounter for preprocedural respiratory examination: Secondary | ICD-10-CM | POA: Diagnosis not present

## 2021-08-26 DIAGNOSIS — J910 Malignant pleural effusion: Secondary | ICD-10-CM | POA: Diagnosis present

## 2021-08-26 DIAGNOSIS — Z681 Body mass index (BMI) 19 or less, adult: Secondary | ICD-10-CM | POA: Diagnosis not present

## 2021-08-26 DIAGNOSIS — Z9221 Personal history of antineoplastic chemotherapy: Secondary | ICD-10-CM | POA: Diagnosis not present

## 2021-08-26 DIAGNOSIS — I5032 Chronic diastolic (congestive) heart failure: Secondary | ICD-10-CM

## 2021-08-26 DIAGNOSIS — N182 Chronic kidney disease, stage 2 (mild): Secondary | ICD-10-CM

## 2021-08-26 DIAGNOSIS — R911 Solitary pulmonary nodule: Secondary | ICD-10-CM | POA: Diagnosis not present

## 2021-08-26 DIAGNOSIS — Z9889 Other specified postprocedural states: Secondary | ICD-10-CM | POA: Diagnosis not present

## 2021-08-26 DIAGNOSIS — Z8249 Family history of ischemic heart disease and other diseases of the circulatory system: Secondary | ICD-10-CM | POA: Diagnosis not present

## 2021-08-26 DIAGNOSIS — J9601 Acute respiratory failure with hypoxia: Secondary | ICD-10-CM | POA: Diagnosis not present

## 2021-08-26 DIAGNOSIS — C3490 Malignant neoplasm of unspecified part of unspecified bronchus or lung: Secondary | ICD-10-CM

## 2021-08-26 DIAGNOSIS — K449 Diaphragmatic hernia without obstruction or gangrene: Secondary | ICD-10-CM | POA: Diagnosis present

## 2021-08-26 DIAGNOSIS — D630 Anemia in neoplastic disease: Secondary | ICD-10-CM | POA: Diagnosis present

## 2021-08-26 DIAGNOSIS — J90 Pleural effusion, not elsewhere classified: Secondary | ICD-10-CM | POA: Diagnosis not present

## 2021-08-26 DIAGNOSIS — I4892 Unspecified atrial flutter: Secondary | ICD-10-CM | POA: Diagnosis present

## 2021-08-26 DIAGNOSIS — J189 Pneumonia, unspecified organism: Secondary | ICD-10-CM | POA: Diagnosis present

## 2021-08-26 DIAGNOSIS — C3430 Malignant neoplasm of lower lobe, unspecified bronchus or lung: Secondary | ICD-10-CM | POA: Diagnosis not present

## 2021-08-26 DIAGNOSIS — J168 Pneumonia due to other specified infectious organisms: Secondary | ICD-10-CM | POA: Diagnosis not present

## 2021-08-26 DIAGNOSIS — Z419 Encounter for procedure for purposes other than remedying health state, unspecified: Secondary | ICD-10-CM | POA: Diagnosis not present

## 2021-08-26 DIAGNOSIS — J9811 Atelectasis: Secondary | ICD-10-CM | POA: Diagnosis not present

## 2021-08-26 DIAGNOSIS — Z9981 Dependence on supplemental oxygen: Secondary | ICD-10-CM | POA: Diagnosis not present

## 2021-08-26 DIAGNOSIS — Z809 Family history of malignant neoplasm, unspecified: Secondary | ICD-10-CM | POA: Diagnosis not present

## 2021-08-26 DIAGNOSIS — I4891 Unspecified atrial fibrillation: Secondary | ICD-10-CM | POA: Diagnosis present

## 2021-08-26 LAB — CBC WITH DIFFERENTIAL/PLATELET
Abs Immature Granulocytes: 0.05 10*3/uL (ref 0.00–0.07)
Basophils Absolute: 0 10*3/uL (ref 0.0–0.1)
Basophils Relative: 0 %
Eosinophils Absolute: 0.2 10*3/uL (ref 0.0–0.5)
Eosinophils Relative: 2 %
HCT: 40 % (ref 39.0–52.0)
Hemoglobin: 12.2 g/dL — ABNORMAL LOW (ref 13.0–17.0)
Immature Granulocytes: 1 %
Lymphocytes Relative: 5 %
Lymphs Abs: 0.5 10*3/uL — ABNORMAL LOW (ref 0.7–4.0)
MCH: 26.6 pg (ref 26.0–34.0)
MCHC: 30.5 g/dL (ref 30.0–36.0)
MCV: 87.3 fL (ref 80.0–100.0)
Monocytes Absolute: 0.9 10*3/uL (ref 0.1–1.0)
Monocytes Relative: 9 %
Neutro Abs: 8 10*3/uL — ABNORMAL HIGH (ref 1.7–7.7)
Neutrophils Relative %: 83 %
Platelets: 413 10*3/uL — ABNORMAL HIGH (ref 150–400)
RBC: 4.58 MIL/uL (ref 4.22–5.81)
RDW: 14.4 % (ref 11.5–15.5)
WBC: 9.8 10*3/uL (ref 4.0–10.5)
nRBC: 0 % (ref 0.0–0.2)

## 2021-08-26 LAB — COMPREHENSIVE METABOLIC PANEL
ALT: 12 U/L (ref 0–44)
AST: 18 U/L (ref 15–41)
Albumin: 3.7 g/dL (ref 3.5–5.0)
Alkaline Phosphatase: 95 U/L (ref 38–126)
Anion gap: 11 (ref 5–15)
BUN: 25 mg/dL — ABNORMAL HIGH (ref 8–23)
CO2: 31 mmol/L (ref 22–32)
Calcium: 9.8 mg/dL (ref 8.9–10.3)
Chloride: 95 mmol/L — ABNORMAL LOW (ref 98–111)
Creatinine, Ser: 1.19 mg/dL (ref 0.61–1.24)
GFR, Estimated: >60 mL/min (ref >60)
Glucose, Bld: 100 mg/dL — ABNORMAL HIGH (ref 70–99)
Potassium: 4.4 mmol/L (ref 3.5–5.1)
Sodium: 137 mmol/L (ref 135–145)
Total Bilirubin: 0.5 mg/dL (ref 0.3–1.2)
Total Protein: 8.3 g/dL — ABNORMAL HIGH (ref 6.5–8.1)

## 2021-08-26 LAB — CREATININE, SERUM
Creatinine, Ser: 1.11 mg/dL (ref 0.61–1.24)
GFR, Estimated: >60 mL/min (ref >60)

## 2021-08-26 LAB — CBC
HCT: 38.7 % — ABNORMAL LOW (ref 39.0–52.0)
Hemoglobin: 11.6 g/dL — ABNORMAL LOW (ref 13.0–17.0)
MCH: 26.5 pg (ref 26.0–34.0)
MCHC: 30 g/dL (ref 30.0–36.0)
MCV: 88.4 fL (ref 80.0–100.0)
Platelets: 369 10*3/uL (ref 150–400)
RBC: 4.38 MIL/uL (ref 4.22–5.81)
RDW: 14.2 % (ref 11.5–15.5)
WBC: 9.5 10*3/uL (ref 4.0–10.5)
nRBC: 0 % (ref 0.0–0.2)

## 2021-08-26 LAB — RESP PANEL BY RT-PCR (FLU A&B, COVID) ARPGX2
Influenza A by PCR: NEGATIVE
Influenza B by PCR: NEGATIVE
SARS Coronavirus 2 by RT PCR: NEGATIVE

## 2021-08-26 LAB — HIV ANTIBODY (ROUTINE TESTING W REFLEX): HIV Screen 4th Generation wRfx: NONREACTIVE

## 2021-08-26 LAB — TROPONIN I (HIGH SENSITIVITY)
Troponin I (High Sensitivity): 6 ng/L (ref <18)
Troponin I (High Sensitivity): 6 ng/L (ref <18)

## 2021-08-26 LAB — BRAIN NATRIURETIC PEPTIDE: B Natriuretic Peptide: 59.1 pg/mL (ref 0.0–100.0)

## 2021-08-26 LAB — LACTIC ACID, PLASMA: Lactic Acid, Venous: 0.9 mmol/L (ref 0.5–1.9)

## 2021-08-26 MED ORDER — VANCOMYCIN HCL IN DEXTROSE 1-5 GM/200ML-% IV SOLN
1000.0000 mg | Freq: Once | INTRAVENOUS | Status: AC
  Administered 2021-08-26: 1000 mg via INTRAVENOUS
  Filled 2021-08-26: qty 200

## 2021-08-26 MED ORDER — ARFORMOTEROL TARTRATE 15 MCG/2ML IN NEBU
15.0000 ug | INHALATION_SOLUTION | Freq: Two times a day (BID) | RESPIRATORY_TRACT | Status: DC
  Administered 2021-08-26 – 2021-08-31 (×9): 15 ug via RESPIRATORY_TRACT
  Filled 2021-08-26 (×8): qty 2

## 2021-08-26 MED ORDER — SODIUM CHLORIDE 0.9 % IV SOLN
2.0000 g | Freq: Two times a day (BID) | INTRAVENOUS | Status: DC
  Administered 2021-08-26 – 2021-08-31 (×9): 2 g via INTRAVENOUS
  Filled 2021-08-26 (×11): qty 2

## 2021-08-26 MED ORDER — VANCOMYCIN HCL 1250 MG/250ML IV SOLN
1250.0000 mg | INTRAVENOUS | Status: DC
  Administered 2021-08-26 – 2021-08-28 (×3): 1250 mg via INTRAVENOUS
  Filled 2021-08-26 (×3): qty 250

## 2021-08-26 MED ORDER — VANCOMYCIN HCL IN DEXTROSE 1-5 GM/200ML-% IV SOLN: 1000.0000 mg | Freq: Two times a day (BID) | INTRAVENOUS | Status: DC

## 2021-08-26 MED ORDER — ENOXAPARIN SODIUM 40 MG/0.4ML IJ SOSY
40.0000 mg | PREFILLED_SYRINGE | INTRAMUSCULAR | Status: DC
  Administered 2021-08-26 – 2021-08-28 (×3): 40 mg via SUBCUTANEOUS
  Filled 2021-08-26 (×3): qty 0.4

## 2021-08-26 MED ORDER — UMECLIDINIUM BROMIDE 62.5 MCG/ACT IN AEPB
1.0000 | INHALATION_SPRAY | Freq: Every day | RESPIRATORY_TRACT | Status: DC
  Administered 2021-08-27 – 2021-08-31 (×4): 1 via RESPIRATORY_TRACT
  Filled 2021-08-26: qty 7

## 2021-08-26 MED ORDER — POLYVINYL ALCOHOL 1.4 % OP SOLN: 1.0000 [drp] | OPHTHALMIC | Status: DC | PRN

## 2021-08-26 MED ORDER — SODIUM CHLORIDE 0.9 % IV SOLN
2.0000 g | Freq: Once | INTRAVENOUS | Status: AC
  Administered 2021-08-26: 2 g via INTRAVENOUS
  Filled 2021-08-26: qty 2

## 2021-08-26 MED ORDER — ASPIRIN EC 81 MG PO TBEC
81.0000 mg | DELAYED_RELEASE_TABLET | Freq: Every day | ORAL | Status: DC
  Administered 2021-08-26 – 2021-08-31 (×5): 81 mg via ORAL
  Filled 2021-08-26 (×5): qty 1

## 2021-08-26 NOTE — ED Notes (Signed)
Pt updated on POC. Provided snacks and drink.

## 2021-08-26 NOTE — Progress Notes (Signed)
IP PROGRESS NOTE  Subjective:   Mr. Kissoon is well-known to me with a history of non-small cell lung cancer.  He was diagnosed in January 2020 when he presented with a malignant right pleural effusion.  He entered clinical remission after treatment with Alimta/carboplatin/pembrolizumab and continued pembrolizumab for 2 years.  Treatment was resumed with pembrolizumab in December when he developed clinical and radiologic evidence of disease progression.  He has developed progressive dyspnea over the past several weeks.  I saw him on 08/20/2021.  He received a second dose of salvage pembrolizumab that day.  He reports progression of dyspnea over the past week.  He now has difficulty ambulating in the home.  He presented to the emergency room yesterday evening after the dyspnea worsened.  No fever.  He has a cough productive of sputum.  No pain.  No rash or diarrhea.  Objective: Vital signs in last 24 hours: Blood pressure 108/64, pulse 99, temperature 98.6 F (37 C), resp. rate 19, height 6' 1" (1.854 m), weight 141 lb (64 kg), SpO2 99 %.  Intake/Output from previous day: 01/11 0701 - 01/12 0700 In: 298.5 [IV Piggyback:298.5] Out: -   Physical Exam:  HEENT: Mild white coat over the tongue, no buccal thrush Lungs: Decreased breath sounds throughout the right chest, diminished breath sounds with scattered rales at the left chest, no respiratory distress at rest Cardiac: Irregular Abdomen: No hepatosplenomegaly Extremities: No leg edema  Lab Results: Recent Labs    08/25/21 2356  WBC 9.8  HGB 12.2*  HCT 40.0  PLT 413*    BMET Recent Labs    08/25/21 2356  NA 137  K 4.4  CL 95*  CO2 31  GLUCOSE 100*  BUN 25*  CREATININE 1.19  CALCIUM 9.8    No results found for: CEA1, CEA, [id number], CA125  Studies/Results: CT Chest Wo Contrast  Result Date: 08/26/2021 CLINICAL DATA:  Pleural effusion, shortness of breath, malignancy suspected. Non-small cell lung cancer. EXAM: CT  CHEST WITHOUT CONTRAST TECHNIQUE: Multidetector CT imaging of the chest was performed following the standard protocol without IV contrast. RADIATION DOSE REDUCTION: This exam was performed according to the departmental dose-optimization program which includes automated exposure control, adjustment of the mA and/or kV according to patient size and/or use of iterative reconstruction technique. COMPARISON:  CT of the chest 07/26/2021. FINDINGS: Cardiovascular: No significant vascular findings. Normal heart size. No pericardial effusion. There are atherosclerotic calcifications of the aorta. Mediastinum/Nodes: Prevascular lymph node has increased in size now measuring 9 mm short axis image 2/66 (previously 4 mm). No enlarged lymph nodes identified by CT size criteria. Thyroid gland and visualized esophagus are within normal limits. Lungs/Pleura: Moderate-size loculated pleural effusion in the right lower lung appears unchanged from the prior examination. There is a small left pleural effusion which appears layering and increased in size. There are new areas of dense consolidation with air bronchograms and interlobular septal thickening throughout the left upper lobe and lingula. There also new airspace opacities with air bronchograms in the superior segment of the left lower lobe. Other peripheral opacities in the superior segment of the left lower lobe appear stable from prior examination. Peripheral airspace opacities in the right upper lobe appear similar to the prior study. There are some new patchy multifocal ground-glass opacities throughout the right upper lobe. Trachea and central airways are patent. No pneumothorax. Upper Abdomen: No acute abnormality. Musculoskeletal: No chest wall mass or suspicious bone lesions identified. IMPRESSION: 1. New extensive left upper lobe airspace  disease with areas of smooth interlobular septal thickening. There is also new airspace disease in the superior segment of the left  lower lobe. Findings are favored as pneumonia/infection. Underlying neoplastic process can not be excluded. Short-term follow-up CT recommended to confirm resolution. 2. Minimal new patchy ground-glass opacities in the right upper lobe, likely infectious/inflammatory. 3. Small layering left pleural effusion has increased in size. 4. New prominent prevascular lymph node. 5. Otherwise stable examination with loculated right pleural effusion and stable chronic appearing parenchymal opacities. Electronically Signed   By: Ronney Asters M.D.   On: 08/26/2021 00:37    Medications: I have reviewed the patient's current medications.  Assessment/Plan:  Metastatic non-small cell lung cancer  large right pleural effusion, right lung mass, right pleural-based masses, left lung nodules Right thoracentesis 09/04/2018-negative cytology CT biopsy of right lateral pleural mass 09/07/2018- poorly differentiated non-small cell carcinoma, malignant cells positive for cytokeratin AE1/AE3 and cytokeratin 8/18, histology favoring adenocarcinoma, MSS, tumor mutation burden 5.no EGFR, ALK, BRAF alteration.  PDL 1 rearrangement PDL 1 tumor proportion score-100%  Cycle 1 Alimta/carboplatin 09/22/2018, pembrolizumab 09/28/2018 Cycle 2 Alimta/carboplatin/pembrolizumab 10/15/2018 Cycle 3 Alimta/carboplatin/pembrolizumab 11/06/2018 Cycle 4 Alimta/carboplatin/pembrolizumab 11/28/2018 CT chest 12/14/2018- response to therapy of lung primary/primaries, thoracic nodal and pulmonary metastasis.  Decrease in right pleural effusion with improvement in pleural-based metastasis.  New T10 heterogeneous sclerotic density likely due to healing of previously CT occult metastasis. Pembrolizumab 12/19/2018 Pembrolizumab/Alimta starting 01/09/2019 Pembrolizumab/Alimta 01/31/2019 Pembrolizumab/Alimta 02/21/2019 Pembrolizumab/Alimta 03/14/2019 CT 04/01/2019- no evidence of disease progression, small loculated right pleural effusion and right basilar pleural  nodularity-stable, stable left lower lobe nodule, persistent sclerosis at T10 Pembrolizumab/Alimta 04/05/2019 Pembrolizumab 05/07/2019 Pembrolizumab 05/28/2019 Pembrolizumab 06/18/2019 Pembrolizumab 07/09/2019 Chest CT 07/26/2019-moderate to large stable exudative right effusion with considerable pleural thickening.  Nodularity at the right lung base along pleural thickening similar to prior.  Stable left paramediastinal groundglass density nodule.  Stable sclerotic lesion posteriorly at the T10 vertebral level. Pembrolizumab 07/30/2019 Pembrolizumab 08/21/2019-changed to every 6-week dosing Pembrolizumab 10/11/2019 Pembrolizumab 11/22/2019 CT chest 01/03/2020-chronic appearing loculated effusion right chest with signs of pleural thickening smaller than on prior exam; similar appearance of tree-in-bud bud nodularity in the right upper lobe and left lung apex along with bronchiectatic changes in the lingula and middle lobe; similar appearance of sclerosis T10 vertebral body Pembrolizumab 01/03/2020 Pembrolizumab 02/14/2020 Pembrolizumab 03/27/2020 Chest CT 05/06/2020-unchanged posttreatment appearance of the chest with a chronic, loculated moderate right pleural effusion with associated atelectasis or consolidation and pleural thickening.  Stable small pulmonary nodules measuring 3 mm or smaller.  Pembrolizumab 05/08/2020 Pembrolizumab 06/19/2020 Pembrolizumab 08/03/2020 Pembrolizumab 09/14/2020 CT chest 11/04/2020-persistent and enlarging complex loculated right pleural fluid collection.  No findings suspicious for recurrent tumor, mediastinal/hilar adenopathy or pulmonary metastatic disease. CT chest 02/04/2021-chronic loculated right pleural effusion, new ill-defined right upper lobe nodule-nonspecific, coronary and aortic valve calcifications  (next scan planned at a 80-monthinterval) CT chest 05/21/2021-substantial increase peripheral airspace opacity in the right upper lobe and superior segment right lower  lobe.  Appearance suspicious for multi lobar pneumonia.  Stable thick-walled chronic large right pleural effusion.  New part solid 1.2 cm superior segment left lower lobe nodule.  Stable left upper lobe groundglass density pulmonary nodule.  Increased bandlike nodular opacity laterally in the left lower lobe. 10-day course of Levaquin   Chest x-ray 06/09/2021-new patchy airspace disease left upper lobe, right upper lobe airspace disease has minimally decreased but not resolved.  Stable loculated right pleural effusion Thoracentesis 07/06/2021-350 cc of thick bloody fluid removed.  Cytology  with atypical cells present.  Culture with no growth. CT chest 07/26/2021-progressive ill-defined nodular airspace process in the left lower lobe with hazy surrounding groundglass opacity.  Significant progression of ill-defined groundglass opacity and slight interstitial thickening left upper lobe.  Improved appearance right upper lobe.  Stable thick-walled loculated right pleural fluid collection.  New small left pleural effusion.  Stable underlying lung disease.  No mediastinal or hilar mass or adenopathy. Pembrolizumab 07/30/2021 Pembrolizumab 08/20/2021   2.  Severe anemia-likely secondary to bleeding in the right pleural space and metastatic carcinoma, red cell transfusions 09/09/2018 09/10/2018, 09/23/2018-improved; progressive 04/26/2019, red cell transfusion.  Improved. 3.  Dyspnea secondary to the large right pleural effusion and anemia Right Pleurx catheter placed 09/11/2018 Chest x-ray 10/09/2018- large right pleural effusion, loculated Chest x-ray 11/06/2018- no interval change in the right-sided pleural effusion Pleurx drainage catheter removed 11/13/2018 4.  History of tobacco use 5.  History of kidney stones 6.  Fever-most likely tumor fever, improved 7.  Admission 10/08/2018 with rapid atrial fibrillation/flutter- improved with diltiazem, started on Eliquis anticoagulation.  Eliquis discontinued.  Now on  aspirin. 8.  Renal insufficiency 9.  Admission 08/26/2021 with increased dyspnea, CT with progressive left chest airspace disease   Mr. Gikas has increased dyspnea.  The chest CT reveals progressive airspace disease, chiefly in the left lung.  The differential diagnosis includes pneumonia, tumor spread in the lungs, and pneumonitis from immunotherapy.  I think pneumonitis is unlikely given he has progressive symptoms prior to resuming pembrolizumab.  He was on pembrolizumab for 2 years without pneumonitis symptoms.  I am concerned his presentation is related to progression of lung cancer. We will need to consider a trial of steroids if his status does not improve over the next several days.  I would hold on steroids for now as this will diminish effectiveness of the immunotherapy.   Recommendations: 1.  Admit for supportive care measures and further evaluation of the airspace disease 2.  Antibiotics, obtain sputum culture 3.  Consult pulmonary medicine to consider a diagnostic bronchoscopy (he has seen Dr. Valeta Harms and is scheduled for drainage of the right effusion and a bronchoscopy as an outpatient) 4.  Hold on steroids for now as this will counteract the effectiveness of immunotherapy       LOS: 0 days   Betsy Coder, MD   08/26/2021, 3:12 PM

## 2021-08-26 NOTE — ED Notes (Signed)
Called Pt Family member Sam and informed him of Pt being transferred to [facility].

## 2021-08-26 NOTE — ED Notes (Signed)
Attempted to call report to AL [id number] X2. No answer. Will attempt again once Pt transport is called.

## 2021-08-26 NOTE — ED Notes (Signed)
Pt had 350 urine output using urinal. New urinal was left at bed side.

## 2021-08-26 NOTE — Telephone Encounter (Signed)
Chart reviewed.

## 2021-08-26 NOTE — ED Notes (Signed)
[facility] arrived to bedside. Report given at bedside to transport. Attempted to call report to floor RN again with no answer

## 2021-08-26 NOTE — H&P (Signed)
Brian Ramirez is an 76 y.o. male.   Chief Complaint: Progressive dyspnea and weakness. HPI: The patient is a 76 yr old man who carries a diagnosis for Non-small cell lung CA. He has been undergoing treatment with Dr. [name] since his diagnosis in 08/2018. He had Alimta/carboplatin/pembrolizumab . He continued pembrolizumab for the next two years. This therapy was resumed in 07/2021. His last treatment was 08/20/2021.   The patient presented to his visit with complaints of progressive dyspnea and weakness over the past week. He presented to [facility] ED on the evening of 08/25/2021 and was following up with Dr. [name] today. CT chest of 07/26/2021 demonstrated a progressive ill-defined nodular airspace process in the left lower lobe with hazy groundglass opacity. There was also slight interstitial thickening of the left upper lobe. There was improved appearance of the rightr upper loabe. There was a stable thick-walled loculated right pleural fluid collection. There is a new small left pleural effusion.   Medical history is significant for anemia of chronic disease, Chronic CHF with Grade 1 diastolic dysfunction and EF of 50-55% as of echocardiogram of 03/11/2019, Atrial fibrillation/flutter, no anticoagulation, anemia of chronic diseaes, CAD of native artery, essential hypertension, non-small cell lung CA, Moderate protein calorie malnutrition, chronic pleural effusion.  [facility] hospitalists have been consulted to admit the patient for further evaluation and treatment.   The patient has been admitted to a telemetry bed. He will be treated with IV cefepime and vancomycin. Sputum cultures and procalcitonin will be obtained. Pulmonology will be consulted in the am for possible bronchoscopy.   Past Medical History:  Diagnosis Date   Acute congestive heart failure ([place])    Anemia of chronic disease 10/08/2018   Anticoagulated    Atrial flutter with rapid ventricular response ([place]) 10/08/2018   CAD in native  artery 06/01/2021   Difficult airway for intubation 09/26/2016   Edema of both lower extremities 10/09/2018   Essential hypertension 06/01/2021   Exertional dyspnea 06/01/2021   Goals of care, counseling/discussion 09/20/2018   Hernia 2012   History of hiatal hernia    History of kidney stones    Left inguinal hernia s/p lap repair 11/22/2016 09/26/2016   Lung cancer ([place]) 09/20/2018   Lung mass 09/04/2018   Malnutrition of moderate degree 09/20/2018   Mass of right lung 09/04/2018   New onset atrial fibrillation ([facility])    Personal history of kidney stones 1992   Pleural effusion 09/04/2018   Pleural effusion on right    Pressure injury of skin 09/20/2018   SOB (shortness of breath) 09/19/2018    Past Surgical History:  Procedure Laterality Date   HERNIA REPAIR  2012   inguinal   INGUINAL HERNIA REPAIR Left 11/22/2016   Procedure: LAPAROSCOPIC  REPAIR OF LEFT INGUINAL HERNIA WITH MESH;  Surgeon: [name], MD;  Location: [facility];  Service: General;  Laterality: Left;   IR INSTILL VIA CHEST TUBE AGENT FOR FIBRINOLYSIS INI DAY  10/10/2018   IR INSTILL VIA CHEST TUBE AGENT FOR FIBRINOLYSIS INI DAY  10/31/2018   IR PERC PLEURAL DRAIN W/INDWELL CATH W/IMG GUIDE  09/11/2018   IR REMOVAL OF PLURAL CATH W/CUFF  11/13/2018   IR THORACENTESIS ASP PLEURAL SPACE W/IMG GUIDE  07/06/2021    Family History  Problem Relation Age of Onset   Heart disease Father    Cancer Brother    Social History:  reports that he has never smoked. He has never used smokeless tobacco. He reports current alcohol use of  about 1.0 standard drink per week. He reports that he does not use drugs. Medications Prior to Admission  Medication Sig Dispense Refill   aspirin EC 81 MG tablet Take 1 tablet (81 mg total) by mouth daily. 90 tablet 3   Artificial Tear Ointment (DRY EYES OP) Apply 1 drop to eye as needed (Dry, itching eyes).     Tiotropium Bromide-Olodaterol (STIOLTO RESPIMAT) 2.5-2.5 MCG/ACT AERS Inhale 2 puffs into the lungs  daily. (Patient not taking: Reported on 08/23/2021) 4 g 0    Allergies: No Known Allergies  Review of Systems - Negative except for those elements included in the HPI above.    Exam:  Constitutional:  The patient is awake, alert, and oriented x 3. No acute distress. Eyes:  pupils and irises appear normal Normal lids and conjunctivae ENMT:  grossly normal hearing  Lips appear normal external ears, nose appear normal Oropharynx: mucosa, tongue,posterior pharynx appear normal Neck:  neck appears normal, no masses, normal ROM, supple no thyromegaly Respiratory:  No increased work of breathing. There are rales in the left chest  No wheezes or rhonchi No tactile fremitus Cardiovascular:  Regular rate and rhythm No murmurs, ectopy, or gallups. No lateral PMI. No thrills. Abdomen:  Abdomen is soft, non-tender, non-distended No hernias, masses, or organomegaly Normoactive bowel sounds.  Musculoskeletal:  No cyanosis, clubbing, or edema Skin:  No rashes, lesions, ulcers palpation of skin: no induration or nodules Neurologic:  CN 2-12 intact Sensation all 4 extremities intact Psychiatric:  Mental status Mood, affect appropriate Orientation to person, place, time  judgment and insight appear intact   Results for orders placed or performed during the hospital encounter of 08/25/21 (from the past 48 hour(s))  Resp Panel by RT-PCR (Flu A&B, Covid) Nasopharyngeal Swab     Status: None   Collection Time: 08/25/21 11:23 PM   Specimen: Nasopharyngeal Swab; Nasopharyngeal(NP) swabs in vial transport medium  Result Value Ref Range   SARS Coronavirus 2 by RT PCR NEGATIVE NEGATIVE    Comment: (NOTE) SARS-CoV-2 target nucleic acids are NOT DETECTED.  The SARS-CoV-2 RNA is generally detectable in upper respiratory specimens during the acute phase of infection. The lowest concentration of SARS-CoV-2 viral copies this assay can detect is 138 copies/mL. A negative result does not  preclude SARS-Cov-2 infection and should not be used as the sole basis for treatment or other patient management decisions. A negative result may occur with  improper specimen collection/handling, submission of specimen other than nasopharyngeal swab, presence of viral mutation(s) within the areas targeted by this assay, and inadequate number of viral copies(<138 copies/mL). A negative result must be combined with clinical observations, patient history, and epidemiological information. The expected result is Negative.  Fact Sheet for Patients:  EntrepreneurPulse.com.au  Fact Sheet for Healthcare Providers:  IncredibleEmployment.be  This test is no t yet approved or cleared by the [place] FDA and  has been authorized for detection and/or diagnosis of SARS-CoV-2 by FDA under an Emergency Use Authorization (EUA). This EUA will remain  in effect (meaning this test can be used) for the duration of the COVID-19 declaration under Section 564(b)(1) of the Act, 21 U.S.C.section 360bbb-3(b)(1), unless the authorization is terminated  or revoked sooner.       Influenza A by PCR NEGATIVE NEGATIVE   Influenza B by PCR NEGATIVE NEGATIVE    Comment: (NOTE) The Xpert Xpress SARS-CoV-2/FLU/RSV plus assay is intended as an aid in the diagnosis of influenza from Nasopharyngeal swab specimens and should not be  used as a sole basis for treatment. Nasal washings and aspirates are unacceptable for Xpert Xpress SARS-CoV-2/FLU/RSV testing.  Fact Sheet for Patients: EntrepreneurPulse.com.au  Fact Sheet for Healthcare Providers: IncredibleEmployment.be  This test is not yet approved or cleared by the [place] FDA and has been authorized for detection and/or diagnosis of SARS-CoV-2 by FDA under an Emergency Use Authorization (EUA). This EUA will remain in effect (meaning this test can be used) for the duration of  the COVID-19 declaration under Section 564(b)(1) of the Act, 21 U.S.C. section 360bbb-3(b)(1), unless the authorization is terminated or revoked.  Performed at [facility], [address] [place], [place] [address]   Comprehensive metabolic panel     Status: Abnormal   Collection Time: 08/25/21 11:56 PM  Result Value Ref Range   Sodium 137 135 - 145 mmol/L   Potassium 4.4 3.5 - 5.1 mmol/L   Chloride 95 (L) 98 - 111 mmol/L   CO2 31 22 - 32 mmol/L   Glucose, Bld 100 (H) 70 - 99 mg/dL    Comment: Glucose reference range applies only to samples taken after fasting for at least 8 hours.   BUN 25 (H) 8 - 23 mg/dL   Creatinine, Ser 1.19 0.61 - 1.24 mg/dL   Calcium 9.8 8.9 - 10.3 mg/dL   Total Protein 8.3 (H) 6.5 - 8.1 g/dL   Albumin 3.7 3.5 - 5.0 g/dL   AST 18 15 - 41 U/L   ALT 12 0 - 44 U/L   Alkaline Phosphatase 95 38 - 126 U/L   Total Bilirubin 0.5 0.3 - 1.2 mg/dL   GFR, Estimated >60 >60 mL/min    Comment: (NOTE) Calculated using the CKD-EPI Creatinine Equation (2021)    Anion gap 11 5 - 15    Comment: Performed at [facility], [place], [place] [address]  CBC with Differential     Status: Abnormal   Collection Time: 08/25/21 11:56 PM  Result Value Ref Range   WBC 9.8 4.0 - 10.5 K/uL   RBC 4.58 4.22 - 5.81 MIL/uL   Hemoglobin 12.2 (L) 13.0 - 17.0 g/dL   HCT 40.0 39.0 - 52.0 %   MCV 87.3 80.0 - 100.0 fL   MCH 26.6 26.0 - 34.0 pg   MCHC 30.5 30.0 - 36.0 g/dL   RDW 14.4 11.5 - 15.5 %   Platelets 413 (H) 150 - 400 K/uL   nRBC 0.0 0.0 - 0.2 %   Neutrophils Relative % 83 %   Neutro Abs 8.0 (H) 1.7 - 7.7 K/uL   Lymphocytes Relative 5 %   Lymphs Abs 0.5 (L) 0.7 - 4.0 K/uL   Monocytes Relative 9 %   Monocytes Absolute 0.9 0.1 - 1.0 K/uL   Eosinophils Relative 2 %   Eosinophils Absolute 0.2 0.0 - 0.5 K/uL   Basophils Relative 0 %   Basophils Absolute 0.0 0.0 - 0.1 K/uL   Immature Granulocytes 1 %   Abs Immature  Granulocytes 0.05 0.00 - 0.07 K/uL    Comment: Performed at [facility], [place], [place] [address]  Troponin I (High Sensitivity)     Status: None   Collection Time: 08/25/21 11:56 PM  Result Value Ref Range   Troponin I (High Sensitivity) 6 <18 ng/L    Comment: (NOTE) Elevated high sensitivity troponin I (hsTnI) values and significant  changes across serial measurements may suggest ACS but many other  chronic and acute conditions are known to  elevate hsTnI results.  Refer to the "Links" section for chest pain algorithms and additional  guidance. Performed at [facility], [address] [place], [place] [address]   Brain natriuretic peptide     Status: None   Collection Time: 08/25/21 11:56 PM  Result Value Ref Range   B Natriuretic Peptide 59.1 0.0 - 100.0 pg/mL    Comment: Performed at [facility], [place], [place] [address]  Lactic acid, plasma     Status: None   Collection Time: 08/26/21  1:55 AM  Result Value Ref Range   Lactic Acid, Venous 0.9 0.5 - 1.9 mmol/L    Comment: Performed at [facility], [place], [place] [address]  Troponin I (High Sensitivity)     Status: None   Collection Time: 08/26/21  2:05 AM  Result Value Ref Range   Troponin I (High Sensitivity) 6 <18 ng/L    Comment: (NOTE) Elevated high sensitivity troponin I (hsTnI) values and significant  changes across serial measurements may suggest ACS but many other  chronic and acute conditions are known to elevate hsTnI results.  Refer to the "Links" section for chest pain algorithms and additional  guidance. Performed at [facility], [address], [place], [place] [address]    [email or web address] @  Blood pressure 118/76, pulse 95, temperature 98.6 F (37 C), resp. rate 16, height 6\' 1"  (1.854 m), weight 64 kg, SpO2 97 %.    Assessment/Plan  Progressive  dyspnea and weakness: DDx: Pneumonitis secondary to resumption of pembrolizumab; pneumonia. The patient is started on IV cefepme and IV Vancomycin. Pulmonology will be consulted in the am for possible bronchoscopy. Sputum culture as well as legionella testing will be obtained.   Non-small cell CA: Patient's last dose of pembrolizumab was 08/20/2021. Monitor.  Anemia of chronic disease: Due to chronic illness and likely nutritional causes. Will check iron studies.   Atrial fibrillation/flutter: Monitor on telemetry. On no nodal agents or anticoagulation at home.   CHF: As per echocardiogram from 07/27/2019 the patient had an EF of 50-55% and Grade 1 diastolic dysfunction. Repeat echocardiogram. Monitor on telemetry.  CKD II: Monitor creatinine, electrolytes, and volume status. Avoid hypotension and nephrotoxic agents.   CAD: Continue ASA 81 mg as at home.  I have seen and examined this patient myself. I have spent 74 minutes in his evaluation and care.   DVT Prophylaxis: Lovenox CODE STATUS: Full Code Family Communications: None available Disposition: tbd  Dasia Guerrier 08/26/2021, 6:49 PM

## 2021-08-26 NOTE — Progress Notes (Signed)
Pharmacy Antibiotic Note  Brian Ramirez is a 76 y.o. male admitted on 08/25/2021 with pneumonia.  Pharmacy has been consulted for vancomycin dosing - received 1g at 01:32 at [facility].  Plan: Vancomycin 1250mg  IV q24h for estimated AUC 488 using SCr 1.19 Check vancomycin levels as needed, goal AUC 400-550 Cefepime 2g q12h per MD Follow up renal function & cultures  Height: 6\' 1"  (185.4 cm) Weight: 64 kg (141 lb) IBW/kg (Calculated) : 79.9  Temp (24hrs), Avg:98.4 F (36.9 C), Min:98.3 F (36.8 C), Max:98.6 F (37 C)  Recent Labs  Lab 08/20/21 0813 08/25/21 2356 08/26/21 0155  WBC 12.0* 9.8  --   CREATININE 1.38* 1.19  --   LATICACIDVEN  --   --  0.9    Estimated Creatinine Clearance: 49.3 mL/min (by C-G formula based on SCr of 1.19 mg/dL).    No Known Allergies  Antimicrobials this admission: 1/12 Vanc >> 1/12 Cefepime >>  Dose adjustments this admission:  Microbiology results: 1/12 BCx: 1/12 Respiratory panel: 1/12 Sputum:  Thank you for allowing pharmacy to be a part of this patients care.  Peggyann Juba, PharmD, BCPS Pharmacy: [phone] 08/26/2021 6:57 PM

## 2021-08-27 DIAGNOSIS — C3430 Malignant neoplasm of lower lobe, unspecified bronchus or lung: Secondary | ICD-10-CM

## 2021-08-27 DIAGNOSIS — E440 Moderate protein-calorie malnutrition: Secondary | ICD-10-CM

## 2021-08-27 DIAGNOSIS — C3490 Malignant neoplasm of unspecified part of unspecified bronchus or lung: Secondary | ICD-10-CM | POA: Diagnosis not present

## 2021-08-27 DIAGNOSIS — J189 Pneumonia, unspecified organism: Secondary | ICD-10-CM | POA: Diagnosis not present

## 2021-08-27 DIAGNOSIS — R0600 Dyspnea, unspecified: Secondary | ICD-10-CM

## 2021-08-27 DIAGNOSIS — J9601 Acute respiratory failure with hypoxia: Secondary | ICD-10-CM

## 2021-08-27 LAB — COMPREHENSIVE METABOLIC PANEL
ALT: 12 U/L (ref 0–44)
AST: 16 U/L (ref 15–41)
Albumin: 2.8 g/dL — ABNORMAL LOW (ref 3.5–5.0)
Alkaline Phosphatase: 79 U/L (ref 38–126)
Anion gap: 12 (ref 5–15)
BUN: 23 mg/dL (ref 8–23)
CO2: 29 mmol/L (ref 22–32)
Calcium: 9 mg/dL (ref 8.9–10.3)
Chloride: 96 mmol/L — ABNORMAL LOW (ref 98–111)
Creatinine, Ser: 1.09 mg/dL (ref 0.61–1.24)
GFR, Estimated: >60 mL/min (ref >60)
Glucose, Bld: 85 mg/dL (ref 70–99)
Potassium: 3.9 mmol/L (ref 3.5–5.1)
Sodium: 137 mmol/L (ref 135–145)
Total Bilirubin: 0.6 mg/dL (ref 0.3–1.2)
Total Protein: 7 g/dL (ref 6.5–8.1)

## 2021-08-27 LAB — CBC WITH DIFFERENTIAL/PLATELET
Abs Immature Granulocytes: 0.05 10*3/uL (ref 0.00–0.07)
Basophils Absolute: 0 10*3/uL (ref 0.0–0.1)
Basophils Relative: 0 %
Eosinophils Absolute: 0.3 10*3/uL (ref 0.0–0.5)
Eosinophils Relative: 3 %
HCT: 36.1 % — ABNORMAL LOW (ref 39.0–52.0)
Hemoglobin: 10.8 g/dL — ABNORMAL LOW (ref 13.0–17.0)
Immature Granulocytes: 1 %
Lymphocytes Relative: 5 %
Lymphs Abs: 0.4 10*3/uL — ABNORMAL LOW (ref 0.7–4.0)
MCH: 26.7 pg (ref 26.0–34.0)
MCHC: 29.9 g/dL — ABNORMAL LOW (ref 30.0–36.0)
MCV: 89.1 fL (ref 80.0–100.0)
Monocytes Absolute: 0.5 10*3/uL (ref 0.1–1.0)
Monocytes Relative: 6 %
Neutro Abs: 6.9 10*3/uL (ref 1.7–7.7)
Neutrophils Relative %: 85 %
Platelets: 333 10*3/uL (ref 150–400)
RBC: 4.05 MIL/uL — ABNORMAL LOW (ref 4.22–5.81)
RDW: 14.3 % (ref 11.5–15.5)
WBC: 8.1 10*3/uL (ref 4.0–10.5)
nRBC: 0 % (ref 0.0–0.2)

## 2021-08-27 LAB — MRSA NEXT GEN BY PCR, NASAL: MRSA by PCR Next Gen: NOT DETECTED

## 2021-08-27 LAB — RESPIRATORY PANEL BY PCR

## 2021-08-27 MED ORDER — METHYLPREDNISOLONE SODIUM SUCC 40 MG IJ SOLR
40.0000 mg | Freq: Two times a day (BID) | INTRAMUSCULAR | Status: DC
  Administered 2021-08-27 – 2021-08-31 (×8): 40 mg via INTRAVENOUS
  Filled 2021-08-27 (×8): qty 1

## 2021-08-27 MED ORDER — ENSURE ENLIVE PO LIQD
237.0000 mL | Freq: Three times a day (TID) | ORAL | Status: DC
  Administered 2021-08-27 – 2021-08-31 (×11): 237 mL via ORAL

## 2021-08-27 MED ORDER — DIPHENHYDRAMINE HCL 25 MG PO CAPS
25.0000 mg | ORAL_CAPSULE | Freq: Four times a day (QID) | ORAL | Status: DC | PRN
  Administered 2021-08-27 – 2021-08-30 (×6): 25 mg via ORAL
  Filled 2021-08-27 (×6): qty 1

## 2021-08-27 NOTE — Plan of Care (Signed)
Discussed with patient plan of care for the evening, pain management and cardiac monitoring with some teach back displayed  Problem: Education: Goal: Knowledge of General Education information will improve Description: Including pain rating scale, medication(s)/side effects and non-pharmacologic comfort measures Outcome: Progressing   Problem: Health Behavior/Discharge Planning: Goal: Ability to manage health-related needs will improve Outcome: Progressing
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
## 2021-08-27 NOTE — TOC Progression Note (Signed)
Oncology Discharge Planning Admission Note  [facility] Address: [address] [place], [place], [place] [address] Hours of Operation:  8am - 5pm, Monday - Friday  Clinic Contact Information:  [phone]  Oncology Care Team: Medical Oncologist:  Dr. Wendee Beavers, NP  has consulted on this patient at bedside.Hospital admission dated 08/26/21, and the cancer center will follow Brian Ramirez inpatient care to assist with discharge planning as indicated by the oncologist.  We will reach out closer to discharge date to arrange follow up care.  Disclaimer:  This [facility] note does not imply a formal consult request has been made by the admitting attending for this admission or there will be an inpatient consult completed by oncology.  Please request oncology consults as per standard process as indicated.

## 2021-08-27 NOTE — Progress Notes (Addendum)
IP PROGRESS NOTE  Subjective:   Reports overall improvement in his dyspnea this morning, but states that he has not been up moving around too difficult to tell.  Continues to have a productive cough.  Objective: Vital signs in last 24 hours: Blood pressure 133/82, pulse 98, temperature 98.2 F (36.8 C), resp. rate 17, height 6' 1" (1.854 m), weight 64 kg, SpO2 95 %.  Intake/Output from previous day: 01/12 0701 - 01/13 0700 In: 820.7 [P.O.:480; IV Piggyback:340.7] Out: 950 [Urine:950]  Physical Exam:  HEENT: No thrush Lungs: Decreased breath sounds throughout the right chest, diminished breath sounds with scattered rales at the left chest, no respiratory distress at rest Cardiac: Irregular Abdomen: No hepatosplenomegaly Extremities: No leg edema  Lab Results: Recent Labs    08/26/21 1924 08/27/21 0436  WBC 9.5 8.1  HGB 11.6* 10.8*  HCT 38.7* 36.1*  PLT 369 333    BMET Recent Labs    08/25/21 2356 08/26/21 1924 08/27/21 0436  NA 137  --  137  K 4.4  --  3.9  CL 95*  --  96*  CO2 31  --  29  GLUCOSE 100*  --  85  BUN 25*  --  23  CREATININE 1.19 1.11 1.09  CALCIUM 9.8  --  9.0    No results found for: CEA1, CEA, [id number], CA125  Studies/Results: CT Chest Wo Contrast  Result Date: 08/26/2021 CLINICAL DATA:  Pleural effusion, shortness of breath, malignancy suspected. Non-small cell lung cancer. EXAM: CT CHEST WITHOUT CONTRAST TECHNIQUE: Multidetector CT imaging of the chest was performed following the standard protocol without IV contrast. RADIATION DOSE REDUCTION: This exam was performed according to the departmental dose-optimization program which includes automated exposure control, adjustment of the mA and/or kV according to patient size and/or use of iterative reconstruction technique. COMPARISON:  CT of the chest 07/26/2021. FINDINGS: Cardiovascular: No significant vascular findings. Normal heart size. No pericardial effusion. There are atherosclerotic  calcifications of the aorta. Mediastinum/Nodes: Prevascular lymph node has increased in size now measuring 9 mm short axis image 2/66 (previously 4 mm). No enlarged lymph nodes identified by CT size criteria. Thyroid gland and visualized esophagus are within normal limits. Lungs/Pleura: Moderate-size loculated pleural effusion in the right lower lung appears unchanged from the prior examination. There is a small left pleural effusion which appears layering and increased in size. There are new areas of dense consolidation with air bronchograms and interlobular septal thickening throughout the left upper lobe and lingula. There also new airspace opacities with air bronchograms in the superior segment of the left lower lobe. Other peripheral opacities in the superior segment of the left lower lobe appear stable from prior examination. Peripheral airspace opacities in the right upper lobe appear similar to the prior study. There are some new patchy multifocal ground-glass opacities throughout the right upper lobe. Trachea and central airways are patent. No pneumothorax. Upper Abdomen: No acute abnormality. Musculoskeletal: No chest wall mass or suspicious bone lesions identified. IMPRESSION: 1. New extensive left upper lobe airspace disease with areas of smooth interlobular septal thickening. There is also new airspace disease in the superior segment of the left lower lobe. Findings are favored as pneumonia/infection. Underlying neoplastic process can not be excluded. Short-term follow-up CT recommended to confirm resolution. 2. Minimal new patchy ground-glass opacities in the right upper lobe, likely infectious/inflammatory. 3. Small layering left pleural effusion has increased in size. 4. New prominent prevascular lymph node. 5. Otherwise stable examination with loculated right pleural effusion and  stable chronic appearing parenchymal opacities. Electronically Signed   By: Ronney Asters M.D.   On: 08/26/2021 00:37     Medications: I have reviewed the patient's current medications.  Assessment/Plan:  Metastatic non-small cell lung cancer  large right pleural effusion, right lung mass, right pleural-based masses, left lung nodules Right thoracentesis 09/04/2018-negative cytology CT biopsy of right lateral pleural mass 09/07/2018- poorly differentiated non-small cell carcinoma, malignant cells positive for cytokeratin AE1/AE3 and cytokeratin 8/18, histology favoring adenocarcinoma, MSS, tumor mutation burden 5.no EGFR, ALK, BRAF alteration.  PDL 1 rearrangement PDL 1 tumor proportion score-100%  Cycle 1 Alimta/carboplatin 09/22/2018, pembrolizumab 09/28/2018 Cycle 2 Alimta/carboplatin/pembrolizumab 10/15/2018 Cycle 3 Alimta/carboplatin/pembrolizumab 11/06/2018 Cycle 4 Alimta/carboplatin/pembrolizumab 11/28/2018 CT chest 12/14/2018- response to therapy of lung primary/primaries, thoracic nodal and pulmonary metastasis.  Decrease in right pleural effusion with improvement in pleural-based metastasis.  New T10 heterogeneous sclerotic density likely due to healing of previously CT occult metastasis. Pembrolizumab 12/19/2018 Pembrolizumab/Alimta starting 01/09/2019 Pembrolizumab/Alimta 01/31/2019 Pembrolizumab/Alimta 02/21/2019 Pembrolizumab/Alimta 03/14/2019 CT 04/01/2019- no evidence of disease progression, small loculated right pleural effusion and right basilar pleural nodularity-stable, stable left lower lobe nodule, persistent sclerosis at T10 Pembrolizumab/Alimta 04/05/2019 Pembrolizumab 05/07/2019 Pembrolizumab 05/28/2019 Pembrolizumab 06/18/2019 Pembrolizumab 07/09/2019 Chest CT 07/26/2019-moderate to large stable exudative right effusion with considerable pleural thickening.  Nodularity at the right lung base along pleural thickening similar to prior.  Stable left paramediastinal groundglass density nodule.  Stable sclerotic lesion posteriorly at the T10 vertebral level. Pembrolizumab 07/30/2019 Pembrolizumab  08/21/2019-changed to every 6-week dosing Pembrolizumab 10/11/2019 Pembrolizumab 11/22/2019 CT chest 01/03/2020-chronic appearing loculated effusion right chest with signs of pleural thickening smaller than on prior exam; similar appearance of tree-in-bud bud nodularity in the right upper lobe and left lung apex along with bronchiectatic changes in the lingula and middle lobe; similar appearance of sclerosis T10 vertebral body Pembrolizumab 01/03/2020 Pembrolizumab 02/14/2020 Pembrolizumab 03/27/2020 Chest CT 05/06/2020-unchanged posttreatment appearance of the chest with a chronic, loculated moderate right pleural effusion with associated atelectasis or consolidation and pleural thickening.  Stable small pulmonary nodules measuring 3 mm or smaller.  Pembrolizumab 05/08/2020 Pembrolizumab 06/19/2020 Pembrolizumab 08/03/2020 Pembrolizumab 09/14/2020 CT chest 11/04/2020-persistent and enlarging complex loculated right pleural fluid collection.  No findings suspicious for recurrent tumor, mediastinal/hilar adenopathy or pulmonary metastatic disease. CT chest 02/04/2021-chronic loculated right pleural effusion, new ill-defined right upper lobe nodule-nonspecific, coronary and aortic valve calcifications  (next scan planned at a 51-monthinterval) CT chest 05/21/2021-substantial increase peripheral airspace opacity in the right upper lobe and superior segment right lower lobe.  Appearance suspicious for multi lobar pneumonia.  Stable thick-walled chronic large right pleural effusion.  New part solid 1.2 cm superior segment left lower lobe nodule.  Stable left upper lobe groundglass density pulmonary nodule.  Increased bandlike nodular opacity laterally in the left lower lobe. 10-day course of Levaquin   Chest x-ray 06/09/2021-new patchy airspace disease left upper lobe, right upper lobe airspace disease has minimally decreased but not resolved.  Stable loculated right pleural effusion Thoracentesis 07/06/2021-350 cc of  thick bloody fluid removed.  Cytology with atypical cells present.  Culture with no growth. CT chest 07/26/2021-progressive ill-defined nodular airspace process in the left lower lobe with hazy surrounding groundglass opacity.  Significant progression of ill-defined groundglass opacity and slight interstitial thickening left upper lobe.  Improved appearance right upper lobe.  Stable thick-walled loculated right pleural fluid collection.  New small left pleural effusion.  Stable underlying lung disease.  No mediastinal or hilar mass or adenopathy. Pembrolizumab 07/30/2021 Pembrolizumab 08/20/2021   2.  Severe anemia-likely secondary to bleeding in the right pleural space and metastatic carcinoma, red cell transfusions 09/09/2018 09/10/2018, 09/23/2018-improved; progressive 04/26/2019, red cell transfusion.  Improved. 3.  Dyspnea secondary to the large right pleural effusion and anemia Right Pleurx catheter placed 09/11/2018 Chest x-ray 10/09/2018- large right pleural effusion, loculated Chest x-ray 11/06/2018- no interval change in the right-sided pleural effusion Pleurx drainage catheter removed 11/13/2018 4.  History of tobacco use 5.  History of kidney stones 6.  Fever-most likely tumor fever, improved 7.  Admission 10/08/2018 with rapid atrial fibrillation/flutter- improved with diltiazem, started on Eliquis anticoagulation.  Eliquis discontinued.  Now on aspirin. 8.  Renal insufficiency 9.  Admission 08/26/2021 with increased dyspnea, CT with progressive left chest airspace disease   Mr. Niehoff appears stable.  CT chest performed on admission showed progressive airspace disease, primarily in the left lung.  Differential diagnosis includes pneumonia, tumor spread to the lungs, or pneumonitis from immunotherapy.  However, pneumonitis thought to be less likely given that he had progressive symptoms prior to resuming pembrolizumab.  We are concerned about his presentation being related to progression of his  lung cancer.  He is currently scheduled for navigational bronchoscopy by Dr. Valeta Harms on 08/31/2021.  He would benefit from pulmonology evaluation this admission.  If he is found to have progressive cancer, can consider systemic therapy with Alimta and carboplatin.  For now, would recommend holding off on steroids as they can diminish the effectiveness of immunotherapy.  However, if symptoms worsen, can consider addition of steroids.  Recommendations: 1.  Antibiotics, obtain sputum culture 2.  Consult pulmonary medicine to consider a diagnostic bronchoscopy (he has seen Dr. Valeta Harms and is scheduled for drainage of the right effusion and a bronchoscopy as an outpatient) 3.  Hold on steroids for now as this will counteract the effectiveness of immunotherapy  Please call medical oncology over the weekend.  We will check on him again on Monday if he remains in the hospital.     LOS: 1 day   Mikey Bussing, NP   08/27/2021, 7:53 AM  Mr. Curt was interviewed and examined.  He appears stable compared to when I saw him yesterday.  He continues to have dyspnea.  The dyspnea is likely related to airspace disease as opposed to the chronic right pleural effusion.  It is unclear whether the lung parenchyma changes are related to progressive lung cancer or infection.  I recommend pulmonary consultation for a diagnostic bronchoscopy. Progressive tumor is the leading differential diagnosis.  I think it is okay to begin palliative steroid therapy if the pulmonary service feels this is indicated.  Please call medical oncology as needed over the weekend.  I will check on him 08/30/2021.  I was present for greater than 50% of today's visit.  I performed medical decision making.

## 2021-08-27 NOTE — Consult Note (Signed)
NAME:  Brian Ramirez, MRN:  [id number], DOB:  11/11/1945, LOS: 1 ADMISSION DATE:  08/25/2021, CONSULTATION DATE:  1/13 REFERRING MD: Dr. Benny Lennert, CHIEF COMPLAINT: Weakness, Dyspnea    History of Present Illness:  76 y/o M who presented to [facility] on 1/11 with reports of weakness and shortness of breath.    He is followed by Dr. [name] for non-small cell lung cancer (originally diagnosed in 09/20/2018), most recently restarted on pembrolizumab in 07/2021 with last dose on 08/20/21. He was seen in the Oncology clinic with a CT of the chest that raised concern for progressive pulmonary changes and was referred to the Pulmonary Clinic. CT review showed progressive changes in sub-solid lesion of the LLL, & loculated right pleural effusion.  He was seen by Dr. Valeta Harms for consideration of bronchoscopy and management of presumed malignant effusion.  He has been arranged 1/17 for robotic assisted navigational bronchoscopy with tissue sampling and drainage of the right pleural effusion.  Unfortunately, he ended up admitted with increased symptom burden.    The patient lives alone, he reports SOB has been his limiting factor at home.  He reports he was performing activities reportedly normally up to October 2022.  He reports he last remembers going out to eat on 10/22 and felt good, was able to drive.  He began having shortness of breath after that limited his routine activities.  He notes he has been getting tired with eating. He denies fevers, chills, n/v/d, choking on foods but does feel as if he has something stuck in his throat at times.  No known sick contacts.   PCCM consulted for pulmonary evaluation.   Pertinent  Medical History  Non-small cell lung cancer - diagnosed 09/2018.   CAD  CHF  Atrial Flutter  Hiatal Hernia  Right sided pleural effusion - multiple prior thoracentesis, most recent with thick bloody pleural fluid removed, unable to drain more than 300 ml and no difference in  effusion on imaging  Difficult Airway for Intubation   Significant Hospital Events: Including procedures, antibiotic start and stop dates in addition to other pertinent events   1/11 Admit with SOB, weakness 1/13 PCCM consulted for evaluation of dyspnea   Interim History / Subjective:  Afebrile  Pt reports SOB with exertion  PT in room working with patient  Objective   Blood pressure 133/82, pulse 98, temperature 98.2 F (36.8 C), resp. rate 17, height 6\' 1"  (1.854 m), weight 64 kg, SpO2 95 %.        Intake/Output Summary (Last 24 hours) at 08/27/2021 1101 Last data filed at 08/27/2021 1000 Gross per 24 hour  Intake 1070.72 ml  Output 950 ml  Net 120.72 ml   Filed Weights   08/25/21 2306  Weight: 64 kg    Examination: General: thin, tall adult male sitting up in chair in NAD HENT: MM pink/moist, anicteric, wearing glasses  Lungs: pursed lipped breathing at rest, clear on left with good air entry, minimal air entry on right lower 2/3 of lung Cardiovascular: s1s2 RRR, no m/r/g  Abdomen: soft/non-tender, bsx4 active, tolerating PO's  Extremities: warm/dry, no edema, prior injury to RLE with limited knee mobility, wears lift shoe on R  Neuro: AAOx4, MAE, non-focal.    Assisted patient with ambulation with PT.  Pt became dizzy with standing but recovered quickly.  Able to walk approximately 8-12 feet to chair in hall then needed a rest.  He returned to his room and required several minutes to recover.  Saturations remained >93% with exertion.    Resolved Hospital Problem list     Assessment & Plan:   Recurrent Right Pleural Effusion  LLL Pulmonary Nodule / Nodular Opacity  NSCLC of Right Lung  Dyspnea  Chronic Hypoxic Respiratory Failure  Moderate Protein Calorie Malnutrition  Discussion: 76 y/o M with chronic hypoxic respiratory failure, and advanced stage non-small cell lung cancer admitted with increasing dyspnea & weakness.  Recent CT imaging of the chest shows  concern for possible new malignancy in the LLL with an ill defined nodular opacity. He has been arranged 1/17 for robotic assisted navigational bronchoscopy with tissue sampling and drainage of the right pleural effusion.  Unfortunately, he ended up admitted with increased symptom burden.  He has had his pleural effusion tapped in the past with limited return.  Most recently, IR was able to get ~ 300 ml of thick bloody pleural fluid out but no change in imaging after procedure.  -wean oxygen to support saturations >90% -pulmonary hygiene - IS, mobilize as able  -PT efforts  -nutrition is paramount here given chronic critical illness associated with cancer  -add ensure TID -begin ~1mg /kg steroids for possible pembrolizumab pneumonitis  -patient is on schedule for biopsy on 1/17 of LLL > note significant radiographic increase in LLL findings on CT imaging since October  -given prior experience of inability to drain pleural space, will hold off on attempting to drain via thoracentesis  -follow intermittent CXR  -continue brovana + incruse while inpatient  -empiric abx for now -consider swallow evaluation   Best Practice (right click and "Reselect all SmartList Selections" daily)  Per primary / TRH    Labs   CBC: Recent Labs  Lab 08/25/21 2356 08/26/21 1924 08/27/21 0436  WBC 9.8 9.5 8.1  NEUTROABS 8.0*  --  6.9  HGB 12.2* 11.6* 10.8*  HCT 40.0 38.7* 36.1*  MCV 87.3 88.4 89.1  PLT 413* 369 824    Basic Metabolic Panel: Recent Labs  Lab 08/25/21 2356 08/26/21 1924 08/27/21 0436  NA 137  --  137  K 4.4  --  3.9  CL 95*  --  96*  CO2 31  --  29  GLUCOSE 100*  --  85  BUN 25*  --  23  CREATININE 1.19 1.11 1.09  CALCIUM 9.8  --  9.0   GFR: Estimated Creatinine Clearance: 53.8 mL/min (by C-G formula based on SCr of 1.09 mg/dL). Recent Labs  Lab 08/25/21 2356 08/26/21 0155 08/26/21 1924 08/27/21 0436  WBC 9.8  --  9.5 8.1  LATICACIDVEN  --  0.9  --   --     Liver  Function Tests: Recent Labs  Lab 08/25/21 2356 08/27/21 0436  AST 18 16  ALT 12 12  ALKPHOS 95 79  BILITOT 0.5 0.6  PROT 8.3* 7.0  ALBUMIN 3.7 2.8*   No results for input(s): LIPASE, AMYLASE in the last 168 hours. No results for input(s): AMMONIA in the last 168 hours.  ABG No results found for: PHART, PCO2ART, PO2ART, HCO3, TCO2, ACIDBASEDEF, O2SAT   Coagulation Profile: No results for input(s): INR, PROTIME in the last 168 hours.  Cardiac Enzymes: No results for input(s): CKTOTAL, CKMB, CKMBINDEX, TROPONINI in the last 168 hours.  HbA1C: No results found for: HGBA1C  CBG: No results for input(s): GLUCAP in the last 168 hours.  Review of Systems: Positives in [place]   Gen: Denies fever, chills, weight change, fatigue / weakness, night sweats HEENT: Denies blurred vision, double  vision, hearing loss, tinnitus, sinus congestion, rhinorrhea, sore throat, neck stiffness, dysphagia PULM: Denies shortness of breath, cough, occasional sputum production, largely in the am's, hemoptysis, wheezing CV: Denies chest pain, edema, orthopnea, paroxysmal nocturnal dyspnea, palpitations GI: Denies abdominal pain, nausea, vomiting, diarrhea, hematochezia, melena, constipation, change in bowel habits GU: Denies dysuria, hematuria, polyuria, oliguria, urethral discharge Endocrine: Denies hot or cold intolerance, polyuria, polyphagia or appetite change Derm: Denies rash, dry skin, scaling or peeling skin change Heme: Denies easy bruising, bleeding, bleeding gums Neuro: Denies headache, numbness, weakness, slurred speech, loss of memory or consciousness   Past Medical History:  He,  has a past medical history of Acute congestive heart failure ([place]), Anemia of chronic disease (10/08/2018), Anticoagulated, Atrial flutter with rapid ventricular response ([place]) (10/08/2018), CAD in native artery (06/01/2021), Difficult airway for intubation (09/26/2016), Edema of both lower extremities (10/09/2018),  Essential hypertension (06/01/2021), Exertional dyspnea (06/01/2021), Goals of care, counseling/discussion (09/20/2018), Hernia (2012), History of hiatal hernia, History of kidney stones, Left inguinal hernia s/p lap repair 11/22/2016 (09/26/2016), Lung cancer ([place]) (09/20/2018), Lung mass (09/04/2018), Malnutrition of moderate degree (09/20/2018), Mass of right lung (09/04/2018), New onset atrial fibrillation ([place]), Personal history of kidney stones (1992), Pleural effusion (09/04/2018), Pleural effusion on right, Pressure injury of skin (09/20/2018), and SOB (shortness of breath) (09/19/2018).   Surgical History:   Past Surgical History:  Procedure Laterality Date   HERNIA REPAIR  2012   inguinal   INGUINAL HERNIA REPAIR Left 11/22/2016   Procedure: LAPAROSCOPIC  REPAIR OF LEFT INGUINAL HERNIA WITH MESH;  Surgeon: [name], MD;  Location: [facility];  Service: General;  Laterality: Left;   IR INSTILL VIA CHEST TUBE AGENT FOR FIBRINOLYSIS INI DAY  10/10/2018   IR INSTILL VIA CHEST TUBE AGENT FOR FIBRINOLYSIS INI DAY  10/31/2018   IR PERC PLEURAL DRAIN W/INDWELL CATH W/IMG GUIDE  09/11/2018   IR REMOVAL OF PLURAL CATH W/CUFF  11/13/2018   IR THORACENTESIS ASP PLEURAL SPACE W/IMG GUIDE  07/06/2021     Social History:   reports that he has never smoked. He has never used smokeless tobacco. He reports current alcohol use of about 1.0 standard drink per week. He reports that he does not use drugs.   Family History:  His family history includes Cancer in his brother; Heart disease in his father.   Allergies No Known Allergies   Home Medications  Prior to Admission medications   Medication Sig Start Date End Date Taking? Authorizing Provider  aspirin EC 81 MG tablet Take 1 tablet (81 mg total) by mouth daily. 12/03/18  Yes Dorothy Spark, MD  Artificial Tear Ointment (DRY EYES OP) Apply 1 drop to eye as needed (Dry, itching eyes).    [provider]  Tiotropium Bromide-Olodaterol (STIOLTO RESPIMAT)  2.5-2.5 MCG/ACT AERS Inhale 2 puffs into the lungs daily. Patient not taking: Reported on 08/23/2021 08/04/21   Garner Nash, DO         Noe Gens, MSN, APRN, NP-C, AGACNP-BC [facility] 08/27/2021, 11:02 AM   Please see [email or web address] for pager details.   From 7A-7P if no response, please call [phone] After hours, please call ELink [phone]

## 2021-08-27 NOTE — Progress Notes (Signed)
Initial Nutrition Assessment  INTERVENTION:   -Ensure Enlive po TID, each supplement provides 350 kcal and 20 grams of protein  NUTRITION DIAGNOSIS:   Increased nutrient needs related to cancer and cancer related treatments as evidenced by estimated needs.  GOAL:   Patient will meet greater than or equal to 90% of their needs  MONITOR:   PO intake, Supplement acceptance, Labs, Weight trends, I & O's  REASON FOR ASSESSMENT:   Malnutrition Screening Tool    ASSESSMENT:   76 yr old man who carries a diagnosis for Non-small cell lung CA. He has been undergoing treatment with Dr. [name] since his diagnosis in 08/2018.  RD unable to see patient x2, providers in room during both attempts.  Per chart review, pt undergoing immunotherapy for metastatic lung cancer. Last treatment on 1/6.  Having increased dyspnea.  Pt is consuming 100% of meals so far this admission. Ensure has been ordered TID, will continue.   Per weight records, pt has lost 11 lbs since 09/14/20 (7% wt loss x 11.5 months, insignificant for time frame).   Medications reviewed.  Labs reviewed.  NUTRITION - FOCUSED PHYSICAL EXAM:  Unable to complete at this time. Noted h/o moderate malnutrition.  Diet Order:   Diet Order             Diet regular Room service appropriate? Yes; Fluid consistency: Thin  Diet effective now                   EDUCATION NEEDS:   Not appropriate for education at this time  Skin:  Skin Assessment: Reviewed RN Assessment  Last BM:  1/11  Height:   Ht Readings from Last 1 Encounters:  08/25/21 6\' 1"  (1.854 m)    Weight:   Wt Readings from Last 1 Encounters:  08/25/21 64 kg    BMI:  Body mass index is 18.6 kg/m.  Estimated Nutritional Needs:   Kcal:  2200-2400  Protein:  110-125g  Fluid:  2.2L/day  Clayton Bibles, MS, RD, LDN Inpatient Clinical Dietitian Contact information available via Amion

## 2021-08-27 NOTE — Progress Notes (Signed)
Brian Ramirez is an 76 y.o. male.   Chief Complaint: Progressive dyspnea and weakness. HPI: The patient is a 76 yr old man who carries a diagnosis for Non-small cell lung CA. He has been undergoing treatment with Dr. Benay Ramirez since his diagnosis in 08/2018. He had Alimta/carboplatin/pembrolizumab . He continued pembrolizumab for the next two years. This therapy was resumed in 07/2021. His last treatment was 08/20/2021.   The patient presented to his visit with complaints of progressive dyspnea and weakness over the past week. He presented to [facility] ED on the evening of 08/25/2021 and was following up with Dr. Benay Ramirez today. CT chest of 07/26/2021 demonstrated a progressive ill-defined nodular airspace process in the left lower lobe with hazy groundglass opacity. There was also slight interstitial thickening of the left upper lobe. There was improved appearance of the rightr upper loabe. There was a stable thick-walled loculated right pleural fluid collection. There is a new small left pleural effusion.   Medical history is significant for anemia of chronic disease, Chronic CHF with Grade 1 diastolic dysfunction and EF of 50-55% as of echocardiogram of 03/11/2019, Atrial fibrillation/flutter, no anticoagulation, anemia of chronic diseaes, CAD of native artery, essential hypertension, non-small cell lung CA, Moderate protein calorie malnutrition, chronic pleural effusion.  [facility] hospitalists have been consulted to admit the patient for further evaluation and treatment.   The patient has been admitted to a telemetry bed. He will be treated with IV cefepime and vancomycin. Sputum cultures and procalcitonin will be obtained.   Pulmonology has been consulted. They have started steroids for possible pneumonitis.   Past Medical History:  Diagnosis Date   Acute congestive heart failure ([place])    Anemia of chronic disease 10/08/2018   Anticoagulated    Atrial flutter with rapid ventricular response ([place])  10/08/2018   CAD in native artery 06/01/2021   Difficult airway for intubation 09/26/2016   Edema of both lower extremities 10/09/2018   Essential hypertension 06/01/2021   Exertional dyspnea 06/01/2021   Goals of care, counseling/discussion 09/20/2018   Hernia 2012   History of hiatal hernia    History of kidney stones    Left inguinal hernia s/p lap repair 11/22/2016 09/26/2016   Lung cancer ([place]) 09/20/2018   Lung mass 09/04/2018   Malnutrition of moderate degree 09/20/2018   Mass of right lung 09/04/2018   New onset atrial fibrillation (HCC)    Personal history of kidney stones 1992   Pleural effusion 09/04/2018   Pleural effusion on right    Pressure injury of skin 09/20/2018   SOB (shortness of breath) 09/19/2018    Past Surgical History:  Procedure Laterality Date   HERNIA REPAIR  2012   inguinal   INGUINAL HERNIA REPAIR Left 11/22/2016   Procedure: LAPAROSCOPIC  REPAIR OF LEFT INGUINAL HERNIA WITH MESH;  Surgeon: Brian Boston, MD;  Location: [facility];  Service: General;  Laterality: Left;   IR INSTILL VIA CHEST TUBE AGENT FOR FIBRINOLYSIS INI DAY  10/10/2018   IR INSTILL VIA CHEST TUBE AGENT FOR FIBRINOLYSIS INI DAY  10/31/2018   IR PERC PLEURAL DRAIN W/INDWELL CATH W/IMG GUIDE  09/11/2018   IR REMOVAL OF PLURAL CATH W/CUFF  11/13/2018   IR THORACENTESIS ASP PLEURAL SPACE W/IMG GUIDE  07/06/2021    Family History  Problem Relation Age of Onset   Heart disease Father    Cancer Brother    Social History:  reports that he has never smoked. He has never used smokeless tobacco. He reports current  alcohol use of about 1.0 standard drink per week. He reports that he does not use drugs. Medications Prior to Admission  Medication Sig Dispense Refill   aspirin EC 81 MG tablet Take 1 tablet (81 mg total) by mouth daily. 90 tablet 3   Artificial Tear Ointment (DRY EYES OP) Apply 1 drop to eye as needed (Dry, itching eyes).     Tiotropium Bromide-Olodaterol (STIOLTO RESPIMAT) 2.5-2.5 MCG/ACT AERS  Inhale 2 puffs into the lungs daily. (Patient not taking: Reported on 08/23/2021) 4 g 0    Allergies: No Known Allergies  Review of Systems - Negative except for those elements included in the HPI above.    Exam:  Constitutional:  The patient is awake, alert, and oriented x 3. No acute distress. Respiratory:  No increased work of breathing. There are rales in the left chest  No wheezes or rhonchi No tactile fremitus Cardiovascular:  Regular rate and rhythm No murmurs, ectopy, or gallups. No lateral PMI. No thrills. Abdomen:  Abdomen is soft, non-tender, non-distended No hernias, masses, or organomegaly Normoactive bowel sounds.  Musculoskeletal:  No cyanosis, clubbing, or edema Skin:  No rashes, lesions, ulcers palpation of skin: no induration or nodules Neurologic:  CN 2-12 intact Sensation all 4 extremities intact Psychiatric:  Mental status Mood, affect appropriate Orientation to person, place, time  judgment and insight appear intact   Results for orders placed or performed during the hospital encounter of 08/25/21 (from the past 48 hour(s))  Resp Panel by RT-PCR (Flu A&B, Covid) Nasopharyngeal Swab     Status: None   Collection Time: 08/25/21 11:23 PM   Specimen: Nasopharyngeal Swab; Nasopharyngeal(NP) swabs in vial transport medium  Result Value Ref Range   SARS Coronavirus 2 by RT PCR NEGATIVE NEGATIVE    Comment: (NOTE) SARS-CoV-2 target nucleic acids are NOT DETECTED.  The SARS-CoV-2 RNA is generally detectable in upper respiratory specimens during the acute phase of infection. The lowest concentration of SARS-CoV-2 viral copies this assay can detect is 138 copies/mL. A negative result does not preclude SARS-Cov-2 infection and should not be used as the sole basis for treatment or other patient management decisions. A negative result may occur with  improper specimen collection/handling, submission of specimen other than nasopharyngeal swab, presence of  viral mutation(s) within the areas targeted by this assay, and inadequate number of viral copies(<138 copies/mL). A negative result must be combined with clinical observations, patient history, and epidemiological information. The expected result is Negative.  Fact Sheet for Patients:  EntrepreneurPulse.com.au  Fact Sheet for Healthcare Providers:  IncredibleEmployment.be  This test is no t yet approved or cleared by the [place] FDA and  has been authorized for detection and/or diagnosis of SARS-CoV-2 by FDA under an Emergency Use Authorization (EUA). This EUA will remain  in effect (meaning this test can be used) for the duration of the COVID-19 declaration under Section 564(b)(1) of the Act, 21 U.S.C.section 360bbb-3(b)(1), unless the authorization is terminated  or revoked sooner.       Influenza A by PCR NEGATIVE NEGATIVE   Influenza B by PCR NEGATIVE NEGATIVE    Comment: (NOTE) The Xpert Xpress SARS-CoV-2/FLU/RSV plus assay is intended as an aid in the diagnosis of influenza from Nasopharyngeal swab specimens and should not be used as a sole basis for treatment. Nasal washings and aspirates are unacceptable for Xpert Xpress SARS-CoV-2/FLU/RSV testing.  Fact Sheet for Patients: EntrepreneurPulse.com.au  Fact Sheet for Healthcare Providers: IncredibleEmployment.be  This test is not yet approved or cleared  by the [place] and has been authorized for detection and/or diagnosis of SARS-CoV-2 by FDA under an Emergency Use Authorization (EUA). This EUA will remain in effect (meaning this test can be used) for the duration of the COVID-19 declaration under Section 564(b)(1) of the Act, 21 U.S.C. section 360bbb-3(b)(1), unless the authorization is terminated or revoked.  Performed at [facility], [address], [place], [place] [address]   Comprehensive metabolic  panel     Status: Abnormal   Collection Time: 08/25/21 11:56 PM  Result Value Ref Range   Sodium 137 135 - 145 mmol/L   Potassium 4.4 3.5 - 5.1 mmol/L   Chloride 95 (L) 98 - 111 mmol/L   CO2 31 22 - 32 mmol/L   Glucose, Bld 100 (H) 70 - 99 mg/dL    Comment: Glucose reference range applies only to samples taken after fasting for at least 8 hours.   BUN 25 (H) 8 - 23 mg/dL   Creatinine, Ser 1.19 0.61 - 1.24 mg/dL   Calcium 9.8 8.9 - 10.3 mg/dL   Total Protein 8.3 (H) 6.5 - 8.1 g/dL   Albumin 3.7 3.5 - 5.0 g/dL   AST 18 15 - 41 U/L   ALT 12 0 - 44 U/L   Alkaline Phosphatase 95 38 - 126 U/L   Total Bilirubin 0.5 0.3 - 1.2 mg/dL   GFR, Estimated >60 >60 mL/min    Comment: (NOTE) Calculated using the CKD-EPI Creatinine Equation (2021)    Anion gap 11 5 - 15    Comment: Performed at [facility], [place], [place] [address]  CBC with Differential     Status: Abnormal   Collection Time: 08/25/21 11:56 PM  Result Value Ref Range   WBC 9.8 4.0 - 10.5 K/uL   RBC 4.58 4.22 - 5.81 MIL/uL   Hemoglobin 12.2 (L) 13.0 - 17.0 g/dL   HCT 40.0 39.0 - 52.0 %   MCV 87.3 80.0 - 100.0 fL   MCH 26.6 26.0 - 34.0 pg   MCHC 30.5 30.0 - 36.0 g/dL   RDW 14.4 11.5 - 15.5 %   Platelets 413 (H) 150 - 400 K/uL   nRBC 0.0 0.0 - 0.2 %   Neutrophils Relative % 83 %   Neutro Abs 8.0 (H) 1.7 - 7.7 K/uL   Lymphocytes Relative 5 %   Lymphs Abs 0.5 (L) 0.7 - 4.0 K/uL   Monocytes Relative 9 %   Monocytes Absolute 0.9 0.1 - 1.0 K/uL   Eosinophils Relative 2 %   Eosinophils Absolute 0.2 0.0 - 0.5 K/uL   Basophils Relative 0 %   Basophils Absolute 0.0 0.0 - 0.1 K/uL   Immature Granulocytes 1 %   Abs Immature Granulocytes 0.05 0.00 - 0.07 K/uL    Comment: Performed at [facility], [place], [place] [address]  Troponin I (High Sensitivity)     Status: None   Collection Time: 08/25/21 11:56 PM  Result Value Ref Range   Troponin I (High  Sensitivity) 6 <18 ng/L    Comment: (NOTE) Elevated high sensitivity troponin I (hsTnI) values and significant  changes across serial measurements may suggest ACS but many other  chronic and acute conditions are known to elevate hsTnI results.  Refer to the "Links" section for chest pain algorithms and additional  guidance. Performed at [facility], [address] [place], [place] [address]   Brain natriuretic peptide     Status: None  Collection Time: 08/25/21 11:56 PM  Result Value Ref Range   B Natriuretic Peptide 59.1 0.0 - 100.0 pg/mL    Comment: Performed at [facility], [place], [place] [address]  Lactic acid, plasma     Status: None   Collection Time: 08/26/21  1:55 AM  Result Value Ref Range   Lactic Acid, Venous 0.9 0.5 - 1.9 mmol/L    Comment: Performed at [facility], [place], [place] [address]  Troponin I (High Sensitivity)     Status: None   Collection Time: 08/26/21  2:05 AM  Result Value Ref Range   Troponin I (High Sensitivity) 6 <18 ng/L    Comment: (NOTE) Elevated high sensitivity troponin I (hsTnI) values and significant  changes across serial measurements may suggest ACS but many other  chronic and acute conditions are known to elevate hsTnI results.  Refer to the "Links" section for chest pain algorithms and additional  guidance. Performed at [facility], [address] [place], [place] [address]   HIV Antibody (routine testing w rflx)     Status: None   Collection Time: 08/26/21  7:24 PM  Result Value Ref Range   HIV Screen 4th Generation wRfx Non Reactive Non Reactive    Comment: Performed at [facility], [place] [address], [place], [place] [address]  CBC     Status: Abnormal   Collection Time: 08/26/21  7:24 PM  Result Value Ref Range   WBC 9.5 4.0 - 10.5 K/uL   RBC 4.38 4.22 - 5.81 MIL/uL   Hemoglobin 11.6 (L) 13.0 - 17.0 g/dL    HCT 38.7 (L) 39.0 - 52.0 %   MCV 88.4 80.0 - 100.0 fL   MCH 26.5 26.0 - 34.0 pg   MCHC 30.0 30.0 - 36.0 g/dL   RDW 14.2 11.5 - 15.5 %   Platelets 369 150 - 400 K/uL   nRBC 0.0 0.0 - 0.2 %    Comment: Performed at [facility], [place] [address] [place], [place] [address]  Creatinine, serum     Status: None   Collection Time: 08/26/21  7:24 PM  Result Value Ref Range   Creatinine, Ser 1.11 0.61 - 1.24 mg/dL   GFR, Estimated >60 >60 mL/min    Comment: (NOTE) Calculated using the CKD-EPI Creatinine Equation (2021) Performed at [facility], [place] [address], [place], [place] [address]   Culture, blood (routine x 2) Call MD if unable to obtain prior to antibiotics being given     Status: None (Preliminary result)   Collection Time: 08/26/21  7:24 PM   Specimen: BLOOD  Result Value Ref Range   Specimen Description      BLOOD LEFT ANTECUBITAL Performed at [facility], [place] [address] [place] [address] [place], [place] [address]    Special Requests      BOTTLES DRAWN AEROBIC AND ANAEROBIC Blood Culture adequate volume Performed at [facility] [address] [place], [place] [address]    Culture      NO GROWTH < 12 HOURS Performed at [facility] [address] [place],  [address]    Report Status PENDING   Culture, blood (routine x 2) Call MD if unable to obtain prior to antibiotics being given     Status: None (Preliminary result)   Collection Time: 08/26/21  7:24 PM   Specimen: BLOOD  Result Value Ref Range   Specimen Description  BLOOD BLOOD RIGHT HAND Performed at [facility] [address] [place], [place] [address]    Special Requests      BOTTLES DRAWN AEROBIC AND ANAEROBIC Blood Culture adequate volume Performed at [facility] [address] [place], [place] [address]    Culture      NO GROWTH < 12 HOURS Performed at [facility] [address] [place], [place] [address]    Report Status PENDING   Comprehensive metabolic panel     Status: Abnormal   Collection Time: 08/27/21  4:36 AM  Result Value Ref Range   Sodium 137 135 - 145 mmol/L   Potassium 3.9 3.5 - 5.1 mmol/L   Chloride 96 (L) 98 - 111 mmol/L   CO2 29 22 - 32 mmol/L   Glucose, Bld 85 70 - 99 mg/dL    Comment: Glucose reference range applies only to samples taken after fasting for at least 8 hours.   BUN 23 8 - 23 mg/dL   Creatinine, Ser 1.09 0.61 - 1.24 mg/dL   Calcium 9.0 8.9 - 10.3 mg/dL   Total Protein 7.0 6.5 - 8.1 g/dL   Albumin 2.8 (L) 3.5 - 5.0 g/dL   AST 16 15 - 41 U/L   ALT 12 0 - 44 U/L   Alkaline Phosphatase 79 38 - 126 U/L   Total Bilirubin 0.6 0.3 - 1.2 mg/dL   GFR, Estimated >60 >60 mL/min    Comment: (NOTE) Calculated using the CKD-EPI Creatinine Equation (2021)    Anion gap 12 5 - 15    Comment: Performed at [facility], [place] [address], [place], [place] [address]  CBC WITH DIFFERENTIAL     Status: Abnormal   Collection Time: 08/27/21  4:36 AM  Result Value Ref Range   WBC 8.1 4.0 - 10.5 K/uL   RBC 4.05 (L) 4.22 - 5.81 MIL/uL   Hemoglobin 10.8 (L) 13.0 - 17.0 g/dL   HCT 36.1 (L) 39.0 - 52.0 %   MCV 89.1 80.0 - 100.0 fL   MCH 26.7 26.0 - 34.0 pg   MCHC 29.9 (L) 30.0 - 36.0 g/dL   RDW 14.3 11.5 - 15.5 %   Platelets 333 150 - 400 K/uL   nRBC 0.0 0.0 - 0.2 %   Neutrophils Relative % 85 %   Neutro Abs 6.9 1.7 - 7.7 K/uL   Lymphocytes Relative 5 %   Lymphs Abs 0.4 (L) 0.7 - 4.0 K/uL   Monocytes Relative 6 %   Monocytes Absolute 0.5 0.1 - 1.0 K/uL   Eosinophils Relative 3 %   Eosinophils Absolute 0.3 0.0 - 0.5 K/uL   Basophils Relative 0 %   Basophils Absolute 0.0 0.0 - 0.1 K/uL   Immature Granulocytes 1 %   Abs Immature Granulocytes 0.05 0.00 - 0.07 K/uL    Comment: Performed at [facility], [place] [address], [place], [place] [address]  Respiratory (~20 pathogens) panel by PCR      Status: None   Collection Time: 08/27/21 12:44 PM   Specimen: Nasopharyngeal Swab; Respiratory  Result Value Ref Range   Adenovirus NOT DETECTED NOT DETECTED   Coronavirus 229E NOT DETECTED NOT DETECTED    Comment: (NOTE) The Coronavirus on the Respiratory Panel, DOES NOT test for the novel  Coronavirus (2019 nCoV)    Coronavirus HKU1 NOT DETECTED NOT DETECTED   Coronavirus NL63 NOT DETECTED NOT DETECTED   Coronavirus OC43 NOT DETECTED NOT DETECTED  Metapneumovirus NOT DETECTED NOT DETECTED   Rhinovirus / Enterovirus NOT DETECTED NOT DETECTED   Influenza A NOT DETECTED NOT DETECTED   Influenza B NOT DETECTED NOT DETECTED   Parainfluenza Virus 1 NOT DETECTED NOT DETECTED   Parainfluenza Virus 2 NOT DETECTED NOT DETECTED   Parainfluenza Virus 3 NOT DETECTED NOT DETECTED   Parainfluenza Virus 4 NOT DETECTED NOT DETECTED   Respiratory Syncytial Virus NOT DETECTED NOT DETECTED   Bordetella pertussis NOT DETECTED NOT DETECTED   Bordetella Parapertussis NOT DETECTED NOT DETECTED   Chlamydophila pneumoniae NOT DETECTED NOT DETECTED   Mycoplasma pneumoniae NOT DETECTED NOT DETECTED    Comment: Performed at [facility], [place] [address] [place], [place] [address]  MRSA Next Gen by PCR, Nasal     Status: None   Collection Time: 08/27/21 12:44 PM   Specimen: Nasal Mucosa; Nasal Swab  Result Value Ref Range   MRSA by PCR Next Gen NOT DETECTED NOT DETECTED    Comment: (NOTE) The GeneXpert MRSA Assay (FDA approved for NASAL specimens only), is one component of a comprehensive MRSA colonization surveillance program. It is not intended to diagnose MRSA infection nor to guide or monitor treatment for MRSA infections. Test performance is not FDA approved in patients less than 62 years old. Performed at [facility], [place] [address] [place], [place] [address]    @[email or web address] @  Blood pressure 108/65, pulse (!) 107, temperature 97.8 F (36.6 C), temperature source  Oral, resp. rate 19, height 6\' 1"  (1.854 m), weight 64 kg, SpO2 99 %.    Assessment/Plan  Progressive dyspnea and weakness: DDx: Pneumonitis secondary to resumption of pembrolizumab; pneumonia. The patient is started on IV cefepme and IV Vancomycin. Pulmonology has been consulted in the am for possible bronchoscopy. Sputum culture as well as legionella testing will be obtained.   Non-small cell CA: Patient's last dose of pembrolizumab was 08/20/2021. Concern for malignant pleural effusion and malignant nodule per pulmonary.  Anemia of chronic disease: Due to chronic illness and likely nutritional causes. Will check iron studies.   Atrial fibrillation/flutter: Monitor on telemetry. On no nodal agents or anticoagulation at home.   CHF: As per echocardiogram from 07/27/2019 the patient had an EF of 50-55% and Grade 1 diastolic dysfunction. Repeat echocardiogram. Monitor on telemetry.  CKD II: Monitor creatinine, electrolytes, and volume status. Avoid hypotension and nephrotoxic agents.   CAD: Continue ASA 81 mg as at home.  Moderate Protein Calorie Malnutrition: Diet has been liberated. Will add Ensure as supplement and consult nutrition.  I have seen and examined this patient myself. I have spent 34 minutes in his evaluation and care.   DVT Prophylaxis: Lovenox CODE STATUS: Full Code Family Communications: None available Disposition: tbd  Caitrin Pendergraph 08/27/2021, 3:38 PM

## 2021-08-28 DIAGNOSIS — J189 Pneumonia, unspecified organism: Secondary | ICD-10-CM | POA: Diagnosis not present

## 2021-08-28 DIAGNOSIS — J9601 Acute respiratory failure with hypoxia: Secondary | ICD-10-CM | POA: Diagnosis not present

## 2021-08-28 DIAGNOSIS — R0600 Dyspnea, unspecified: Secondary | ICD-10-CM | POA: Diagnosis not present

## 2021-08-28 LAB — PROCALCITONIN: Procalcitonin: 0.81 ng/mL

## 2021-08-28 NOTE — TOC Initial Note (Signed)
Transition of Care [facility]) - Initial/Assessment Note    Patient Details  Name: Brian Ramirez MRN: [id number] Date of Birth: 08-29-1945  Transition of Care [facility]) CM/SW Contact:    Ross Ludwig, LCSW Phone Number: 08/28/2021, 3:51 PM  Clinical Narrative:                 Patient is a 76 year old male who is alert and oriented x4.  PT saw patient and recommended home health PT.  CSW contacted Amy at [facility], they can accept patient for home health services.  Expected Discharge Plan: [place] Barriers to Discharge: Continued Medical Work up   Patient Goals and CMS Choice Patient states their goals for this hospitalization and ongoing recovery are:: To return back home with home health. CMS Medicare.gov Compare Post Acute Care list provided to:: Patient Choice offered to / list presented to : Patient  Expected Discharge Plan and Services Expected Discharge Plan: [place]       Living arrangements for the past 2 months: Single Family Home                           HH Arranged: PT HH Agency: [facility] Date [place]: 08/28/21 Time HH Agency Contacted: 1400 Representative spoke with at [place]: [place] Arrangements/Services Living arrangements for the past 2 months: [place] with:: Self Patient language and need for interpreter reviewed:: Yes Do you feel safe going back to the place where you live?: Yes      Need for Family Participation in Patient Care: No (Comment) Care giver support system in place?: No (comment)   Criminal Activity/Legal Involvement Pertinent to Current Situation/Hospitalization: No - Comment as needed  Activities of Daily Living Home Assistive Devices/Equipment: None ADL Screening (condition at time of admission) Patient's cognitive ability adequate to safely complete daily activities?: Yes Is the patient deaf or have difficulty hearing?: No Does the patient  have difficulty seeing, even when wearing glasses/contacts?: No Does the patient have difficulty concentrating, remembering, or making decisions?: No Patient able to express need for assistance with ADLs?: Yes Does the patient have difficulty dressing or bathing?: Yes Independently performs ADLs?: No Communication: Independent Dressing (OT): Needs assistance Grooming: Needs assistance Feeding: Independent Bathing: Needs assistance Toileting: Needs assistance In/Out Bed: Needs assistance Walks in Home: Independent Does the patient have difficulty walking or climbing stairs?: No Weakness of Legs: Both Weakness of Arms/Hands: Both  Permission Sought/Granted Permission sought to share information with : Family Supports, Case Manager Permission granted to share information with : Yes, Release of Information Signed  Share Information with NAME: [name], [name] [phone]    [name] Friend   [phone]           Emotional Assessment Appearance:: Appears stated age   Affect (typically observed): Accepting, Appropriate, Calm Orientation: : Oriented to Self, Oriented to  Time, Oriented to Place, Oriented to Situation Alcohol / Substance Use: Not Applicable Psych Involvement: No (comment)  Admission diagnosis:  HCAP (healthcare-associated pneumonia) [J18.9] Pneumonia of both lungs due to infectious organism [J18.9] Dyspnea [R06.00] Pneumonia [J18.9] Patient Active Problem List   Diagnosis Date Noted   Acute respiratory failure with hypoxia ([place])    Pneumonia of both lungs due to infectious organism 08/26/2021   Dyspnea 08/26/2021   Pneumonia 08/26/2021   Essential hypertension 06/01/2021   Exertional dyspnea 06/01/2021   CAD in native  artery 06/01/2021   Edema of both lower extremities 10/09/2018   Pleural effusion on right    Acute congestive heart failure ([place])    New onset atrial fibrillation (H[facility])    Anticoagulated    Atrial flutter with rapid ventricular  response ([place]) 10/08/2018   Anemia of chronic disease 10/08/2018   Pressure injury of skin 09/20/2018   Malnutrition of moderate degree 09/20/2018   Lung cancer ([place]) 09/20/2018   Goals of care, counseling/discussion 09/20/2018   SOB (shortness of breath) 09/19/2018   Pleural effusion 09/04/2018   Lung mass 09/04/2018   Mass of right lung 09/04/2018   Left inguinal hernia s/p lap repair 11/22/2016 09/26/2016   Difficult airway for intubation 09/26/2016   PCP:  [name], MD Pharmacy:   [facility] [id number] [place], [place] [address] Phone: [phone] Fax: [phone]     Social Determinants of Health (SDOH) Interventions    Readmission Risk Interventions No flowsheet data found.

## 2021-08-28 NOTE — Evaluation (Signed)
Clinical/Bedside Swallow Evaluation Patient Details  Name: Brian Ramirez MRN: [id number] Date of Birth: 1946/04/02  Today's Date: 08/28/2021 Time: SLP Start Time (ACUTE ONLY): 32 SLP Stop Time (ACUTE ONLY): 1105 SLP Time Calculation (min) (ACUTE ONLY): 25 min  Past Medical History:  Past Medical History:  Diagnosis Date   Acute congestive heart failure ([place])    Anemia of chronic disease 10/08/2018   Anticoagulated    Atrial flutter with rapid ventricular response ([place]) 10/08/2018   CAD in native artery 06/01/2021   Difficult airway for intubation 09/26/2016   Edema of both lower extremities 10/09/2018   Essential hypertension 06/01/2021   Exertional dyspnea 06/01/2021   Goals of care, counseling/discussion 09/20/2018   Hernia 2012   History of hiatal hernia    History of kidney stones    Left inguinal hernia s/p lap repair 11/22/2016 09/26/2016   Lung cancer ([place]) 09/20/2018   Lung mass 09/04/2018   Malnutrition of moderate degree 09/20/2018   Mass of right lung 09/04/2018   New onset atrial fibrillation (HCC)    Personal history of kidney stones 1992   Pleural effusion 09/04/2018   Pleural effusion on right    Pressure injury of skin 09/20/2018   SOB (shortness of breath) 09/19/2018   Past Surgical History:  Past Surgical History:  Procedure Laterality Date   HERNIA REPAIR  2012   inguinal   INGUINAL HERNIA REPAIR Left 11/22/2016   Procedure: LAPAROSCOPIC  REPAIR OF LEFT INGUINAL HERNIA WITH MESH;  Surgeon: [name], MD;  Location: [facility];  Service: General;  Laterality: Left;   IR INSTILL VIA CHEST TUBE AGENT FOR FIBRINOLYSIS INI DAY  10/10/2018   IR INSTILL VIA CHEST TUBE AGENT FOR FIBRINOLYSIS INI DAY  10/31/2018   IR PERC PLEURAL DRAIN W/INDWELL CATH W/IMG GUIDE  09/11/2018   IR REMOVAL OF PLURAL CATH W/CUFF  11/13/2018   IR THORACENTESIS ASP PLEURAL SPACE W/IMG GUIDE  07/06/2021   HPI:  76 yr old man presented to [facility] ED 1/11 with progressive dyspnea and weakness, possible  pna. PMHx of  non-small cell lung CA, anemia, CAD, HTN, afib, thoracentesis 07/06/21. He has been undergoing treatment with Dr. [name] since his diagnosis in 08/2018.  He is being followed by Pulmonology during this admission. He has moderate protein calorie malnutrition. Pt describes frequent phlegm production triggered by swallowing liquids; chronic dry mouth.  Hx of jaw fracture from 1970s leading to surgery/trach and now limited mobility of mandible.    Assessment / Plan / Recommendation  Clinical Impression  Pt participated in clinical swallow assessment.  Oral mechanism exam - xerostomia, limited jaw mobility secondary to fractures/surgery in the '70s. [name] describes frequent mucus production/expectoration that has worsened in the last month and seems to be related to drinking liquids.  His overall swallowing function appeared to be normal: thorough oral manipulation and normal timing; brisk swallow response.  He did demonstrate throat clearing and expectoration after swallowing liquids as he described.  We discussed that a modifed barium swallow evaluation may offer more information, and that he could pursue this study if desired, but it may not be a priority for him at this time.  He agreed he would seek a consult for MBS should the symptoms become worse and/or interfere with daily life.  Will sign off at this time - should he want to pursue MBS as an OP, orders will need to be written and appt made through [facility] at [phone]. SLP Visit Diagnosis: Dysphagia, unspecified (  R13.10)    Aspiration Risk  No limitations    Diet Recommendation     Medication Administration: Whole meds with liquid (or crush)    Other  Recommendations Oral Care Recommendations: Oral care BID    Recommendations for follow up therapy are one component of a multi-disciplinary discharge planning process, led by the attending physician.  Recommendations may be updated based on patient status,  additional functional criteria and insurance authorization.  Follow up Recommendations No SLP follow up                          Prognosis        Swallow Study   General HPI: 76 yr old man presented to [facility] ED 1/11 with progressive dyspnea and weakness, possible pna. PMHx of  non-small cell lung CA, anemia, CAD, HTN, afib, thoracentesis 07/06/21. He has been undergoing treatment with Dr. [name] since his diagnosis in 08/2018.  He has moderate protein calorie malnutrition. Pt describes frequent phlegm production triggered by swallowing liquids; chronic dry mouth.  Hx of jaw fracture from 1970s leading to surgery/trach and now limited mobility of mandible. Type of Study: Bedside Swallow Evaluation Previous Swallow Assessment: no Diet Prior to this Study: Regular;Thin liquids Temperature Spikes Noted: No Respiratory Status: Nasal cannula History of Recent Intubation: No Behavior/Cognition: Alert;Cooperative;Pleasant mood Oral Cavity Assessment: Other (comment) (limited mandible opening s/p jaw surgery '70s) Oral Care Completed by SLP: Recent completion by staff Oral Cavity - Dentition: Adequate natural dentition Vision: Functional for self-feeding Self-Feeding Abilities: Able to feed self Patient Positioning: Upright in bed Baseline Vocal Quality: Normal Volitional Cough: Strong Volitional Swallow: Able to elicit    Oral/Motor/Sensory Function Overall Oral Motor/Sensory Function: Within functional limits   Ice Chips Ice chips: Within functional limits   Thin Liquid Thin Liquid: Within functional limits    Nectar Thick Nectar Thick Liquid: Not tested   Honey Thick Honey Thick Liquid: Not tested   Puree Puree: Within functional limits   Solid     Solid: Within functional limits      Brian Ramirez 08/28/2021,11:20 AM Brian Ramirez, [place] Office number [phone] Pager [phone]

## 2021-08-28 NOTE — TOC Progression Note (Deleted)
Transition of Care [facility]) - Progression Note    Patient Details  Name: Brian Ramirez MRN: [id number] Date of Birth: [birth year]  Transition of [facility]) CM/SW Contact  Ross Ludwig, [place] Phone Number: 08/28/2021, 3:47 PM  Clinical Narrative:    Patient did not have a preference for agencies.  CSW was able to get home health PT set up for patient through [facility].         Expected Discharge Plan and Services                                                 Social Determinants of Health (SDOH) Interventions    Readmission Risk Interventions No flowsheet data found.

## 2021-08-28 NOTE — OR Nursing (Signed)
Per message from Dr Valeta Harms, since now admitted and inpatient, moving his bronch from [place] to Monday at [facility]. Will arrange carelink transfer to [place] for Monday a.m.

## 2021-08-28 NOTE — Progress Notes (Signed)
Brian Ramirez is an 76 y.o. male.   Chief Complaint: Progressive dyspnea and weakness. HPI: The patient is a 76 yr old man who carries a diagnosis for Non-small cell lung CA. He has been undergoing treatment with Dr. [name] since his diagnosis in 08/2018. He had Alimta/carboplatin/pembrolizumab . He continued pembrolizumab for the next two years. This therapy was resumed in 07/2021. His last treatment was 08/20/2021.   The patient presented to his visit with complaints of progressive dyspnea and weakness over the past week. He presented to [facility] ED on the evening of 08/25/2021 and was following up with Dr. [name] today. CT chest of 07/26/2021 demonstrated a progressive ill-defined nodular airspace process in the left lower lobe with hazy groundglass opacity. There was also slight interstitial thickening of the left upper lobe. There was improved appearance of the rightr upper loabe. There was a stable thick-walled loculated right pleural fluid collection. There is a new small left pleural effusion.   Medical history is significant for anemia of chronic disease, Chronic CHF with Grade 1 diastolic dysfunction and EF of 50-55% as of echocardiogram of 03/11/2019, Atrial fibrillation/flutter, no anticoagulation, anemia of chronic diseaes, CAD of native artery, essential hypertension, non-small cell lung CA, Moderate protein calorie malnutrition, chronic pleural effusion.  [facility] hospitalists have been consulted to admit the patient for further evaluation and treatment.   The patient has been admitted to a telemetry bed. He will be treated with IV cefepime and vancomycin. Sputum cultures and procalcitonin will be obtained.   Pulmonology has been consulted. They have started steroids for possible pneumonitis.   Plan is for thoracentesis.  Past Medical History:  Diagnosis Date   Acute congestive heart failure ([place])    Anemia of chronic disease 10/08/2018   Anticoagulated    Atrial flutter with rapid  ventricular response ([place]) 10/08/2018   CAD in native artery 06/01/2021   Difficult airway for intubation 09/26/2016   Edema of both lower extremities 10/09/2018   Essential hypertension 06/01/2021   Exertional dyspnea 06/01/2021   Goals of care, counseling/discussion 09/20/2018   Hernia 2012   History of hiatal hernia    History of kidney stones    Left inguinal hernia s/p lap repair 11/22/2016 09/26/2016   Lung cancer ([place]) 09/20/2018   Lung mass 09/04/2018   Malnutrition of moderate degree 09/20/2018   Mass of right lung 09/04/2018   New onset atrial fibrillation (HCC)    Personal history of kidney stones 1992   Pleural effusion 09/04/2018   Pleural effusion on right    Pressure injury of skin 09/20/2018   SOB (shortness of breath) 09/19/2018    Past Surgical History:  Procedure Laterality Date   HERNIA REPAIR  2012   inguinal   INGUINAL HERNIA REPAIR Left 11/22/2016   Procedure: LAPAROSCOPIC  REPAIR OF LEFT INGUINAL HERNIA WITH MESH;  Surgeon: [name], MD;  Location[facility];  Service: General;  Laterality: Left;   IR INSTILL VIA CHEST TUBE AGENT FOR FIBRINOLYSIS INI DAY  10/10/2018   IR INSTILL VIA CHEST TUBE AGENT FOR FIBRINOLYSIS INI DAY  10/31/2018   IR PERC PLEURAL DRAIN W/INDWELL CATH W/IMG GUIDE  09/11/2018   IR REMOVAL OF PLURAL CATH W/CUFF  11/13/2018   IR THORACENTESIS ASP PLEURAL SPACE W/IMG GUIDE  07/06/2021    Family History  Problem Relation Age of Onset   Heart disease Father    Cancer Brother    Social History:  reports that he has never smoked. He has never used  smokeless tobacco. He reports current alcohol use of about 1.0 standard drink per week. He reports that he does not use drugs. Medications Prior to Admission  Medication Sig Dispense Refill   aspirin EC 81 MG tablet Take 1 tablet (81 mg total) by mouth daily. 90 tablet 3   Artificial Tear Ointment (DRY EYES OP) Apply 1 drop to eye as needed (Dry, itching eyes).     Tiotropium Bromide-Olodaterol (STIOLTO  RESPIMAT) 2.5-2.5 MCG/ACT AERS Inhale 2 puffs into the lungs daily. (Patient not taking: Reported on 08/23/2021) 4 g 0    Allergies: No Known Allergies  Review of Systems - Negative except for those elements included in the HPI above.    Exam:  Constitutional:  The patient is awake, alert, and oriented x 3. No acute distress. Respiratory:  No increased work of breathing. There are rales in the left chest  No wheezes or rhonchi No tactile fremitus Cardiovascular:  Regular rate and rhythm No murmurs, ectopy, or gallups. No lateral PMI. No thrills. Abdomen:  Abdomen is soft, non-tender, non-distended No hernias, masses, or organomegaly Normoactive bowel sounds.  Musculoskeletal:  No cyanosis, clubbing, or edema Skin:  No rashes, lesions, ulcers palpation of skin: no induration or nodules Neurologic:  CN 2-12 intact Sensation all 4 extremities intact Psychiatric:  Mental status Mood, affect appropriate Orientation to person, place, time  judgment and insight appear intact   Results for orders placed or performed during the hospital encounter of 08/25/21 (from the past 48 hour(s))  Comprehensive metabolic panel     Status: Abnormal   Collection Time: 08/27/21  4:36 AM  Result Value Ref Range   Sodium 137 135 - 145 mmol/L   Potassium 3.9 3.5 - 5.1 mmol/L   Chloride 96 (L) 98 - 111 mmol/L   CO2 29 22 - 32 mmol/L   Glucose, Bld 85 70 - 99 mg/dL    Comment: Glucose reference range applies only to samples taken after fasting for at least 8 hours.   BUN 23 8 - 23 mg/dL   Creatinine, Ser 1.09 0.61 - 1.24 mg/dL   Calcium 9.0 8.9 - 10.3 mg/dL   Total Protein 7.0 6.5 - 8.1 g/dL   Albumin 2.8 (L) 3.5 - 5.0 g/dL   AST 16 15 - 41 U/L   ALT 12 0 - 44 U/L   Alkaline Phosphatase 79 38 - 126 U/L   Total Bilirubin 0.6 0.3 - 1.2 mg/dL   GFR, Estimated >60 >60 mL/min    Comment: (NOTE) Calculated using the CKD-EPI Creatinine Equation (2021)    Anion gap 12 5 - 15    Comment:  Performed at [facility], [place] [address], [place], [place] [address]  CBC WITH DIFFERENTIAL     Status: Abnormal   Collection Time: 08/27/21  4:36 AM  Result Value Ref Range   WBC 8.1 4.0 - 10.5 K/uL   RBC 4.05 (L) 4.22 - 5.81 MIL/uL   Hemoglobin 10.8 (L) 13.0 - 17.0 g/dL   HCT 36.1 (L) 39.0 - 52.0 %   MCV 89.1 80.0 - 100.0 fL   MCH 26.7 26.0 - 34.0 pg   MCHC 29.9 (L) 30.0 - 36.0 g/dL   RDW 14.3 11.5 - 15.5 %   Platelets 333 150 - 400 K/uL   nRBC 0.0 0.0 - 0.2 %   Neutrophils Relative % 85 %   Neutro Abs 6.9 1.7 - 7.7 K/uL   Lymphocytes Relative 5 %   Lymphs Abs 0.4 (L)  0.7 - 4.0 K/uL   Monocytes Relative 6 %   Monocytes Absolute 0.5 0.1 - 1.0 K/uL   Eosinophils Relative 3 %   Eosinophils Absolute 0.3 0.0 - 0.5 K/uL   Basophils Relative 0 %   Basophils Absolute 0.0 0.0 - 0.1 K/uL   Immature Granulocytes 1 %   Abs Immature Granulocytes 0.05 0.00 - 0.07 K/uL    Comment: Performed at [facility], [place] [address] [place], [place] [address]  Respiratory (~20 pathogens) panel by PCR     Status: None   Collection Time: 08/27/21 12:44 PM   Specimen: Nasopharyngeal Swab; Respiratory  Result Value Ref Range   Adenovirus NOT DETECTED NOT DETECTED   Coronavirus 229E NOT DETECTED NOT DETECTED    Comment: (NOTE) The Coronavirus on the Respiratory Panel, DOES NOT test for the novel  Coronavirus (2019 nCoV)    Coronavirus HKU1 NOT DETECTED NOT DETECTED   Coronavirus NL63 NOT DETECTED NOT DETECTED   Coronavirus OC43 NOT DETECTED NOT DETECTED   Metapneumovirus NOT DETECTED NOT DETECTED   Rhinovirus / Enterovirus NOT DETECTED NOT DETECTED   Influenza A NOT DETECTED NOT DETECTED   Influenza B NOT DETECTED NOT DETECTED   Parainfluenza Virus 1 NOT DETECTED NOT DETECTED   Parainfluenza Virus 2 NOT DETECTED NOT DETECTED   Parainfluenza Virus 3 NOT DETECTED NOT DETECTED   Parainfluenza Virus 4 NOT DETECTED NOT DETECTED   Respiratory Syncytial Virus NOT  DETECTED NOT DETECTED   Bordetella pertussis NOT DETECTED NOT DETECTED   Bordetella Parapertussis NOT DETECTED NOT DETECTED   Chlamydophila pneumoniae NOT DETECTED NOT DETECTED   Mycoplasma pneumoniae NOT DETECTED NOT DETECTED    Comment: Performed at [facility], [place] [address], [place], [place] [address]  MRSA Next Gen by PCR, Nasal     Status: None   Collection Time: 08/27/21 12:44 PM   Specimen: Nasal Mucosa; Nasal Swab  Result Value Ref Range   MRSA by PCR Next Gen NOT DETECTED NOT DETECTED    Comment: (NOTE) The GeneXpert MRSA Assay (FDA approved for NASAL specimens only), is one component of a comprehensive MRSA colonization surveillance program. It is not intended to diagnose MRSA infection nor to guide or monitor treatment for MRSA infections. Test performance is not FDA approved in patients less than 39 years old. Performed at [facility], [place] [address] [place], [place] [address]   Procalcitonin     Status: None   Collection Time: 08/28/21  5:52 AM  Result Value Ref Range   Procalcitonin 0.81 ng/mL    Comment:        Interpretation: PCT > 0.5 ng/mL and <= 2 ng/mL: Systemic infection (sepsis) is possible, but other conditions are known to elevate PCT as well. (NOTE)       Sepsis PCT Algorithm           Lower Respiratory Tract                                      Infection PCT Algorithm    ----------------------------     ----------------------------         PCT < 0.25 ng/mL                PCT < 0.10 ng/mL          Strongly encourage  Strongly discourage   discontinuation of antibiotics    initiation of antibiotics    ----------------------------     -----------------------------       PCT 0.25 - 0.50 ng/mL            PCT 0.10 - 0.25 ng/mL               OR       >80% decrease in PCT            Discourage initiation of                                            antibiotics      Encourage discontinuation           of  antibiotics    ----------------------------     -----------------------------         PCT >= 0.50 ng/mL              PCT 0.26 - 0.50 ng/mL                AND       <80% decrease in PCT             Encourage initiation of                                             antibiotics       Encourage continuation           of antibiotics    ----------------------------     -----------------------------        PCT >= 0.50 ng/mL                  PCT > 0.50 ng/mL               AND         increase in PCT                  Strongly encourage                                      initiation of antibiotics    Strongly encourage escalation           of antibiotics                                     -----------------------------                                           PCT <= 0.25 ng/mL                                                 OR                                        >  80% decrease in PCT                                      Discontinue / Do not initiate                                             antibiotics  Performed at [facility] [address], [place], [place] [address]    @[email or web address] @  Blood pressure 120/67, pulse 78, temperature (!) 97.4 F (36.3 C), temperature source Oral, resp. rate 18, height 6\' 1"  (1.854 m), weight 64 kg, SpO2 94 %.    Assessment/Plan  Progressive dyspnea and weakness: DDx: Pneumonitis secondary to resumption of pembrolizumab; pneumonia. The patient is started on IV cefepme and IV Vancomycin. Pulmonology has been consulted in the am for possible bronchoscopy. Sputum culture as well as legionella testing will be obtained.   Non-small cell CA: Patient's last dose of pembrolizumab was 08/20/2021. Concern for malignant pleural effusion and malignant nodule per pulmonary.  Anemia of chronic disease: Due to chronic illness and likely nutritional causes. Will check iron studies.   Atrial fibrillation/flutter: Monitor on telemetry. On no nodal  agents or anticoagulation at home.   CHF: As per echocardiogram from 07/27/2019 the patient had an EF of 50-55% and Grade 1 diastolic dysfunction. Repeat echocardiogram. Monitor on telemetry.  CKD II: Monitor creatinine, electrolytes, and volume status. Avoid hypotension and nephrotoxic agents.   CAD: Continue ASA 81 mg as at home.  Moderate Protein Calorie Malnutrition: Diet has been liberated. Will add Ensure as supplement and consult nutrition.  I have seen and examined this patient myself. I have spent 34 minutes in his evaluation and care.   Pulmonology and oncology have been consulted.  DVT Prophylaxis: Lovenox CODE STATUS: Full Code Family Communications: None available Disposition: tbd  Glenora Morocho 08/28/2021, 7:56 PM

## 2021-08-29 DIAGNOSIS — N179 Acute kidney failure, unspecified: Secondary | ICD-10-CM

## 2021-08-29 DIAGNOSIS — J9601 Acute respiratory failure with hypoxia: Secondary | ICD-10-CM | POA: Diagnosis not present

## 2021-08-29 DIAGNOSIS — J189 Pneumonia, unspecified organism: Secondary | ICD-10-CM | POA: Diagnosis not present

## 2021-08-29 DIAGNOSIS — Z01811 Encounter for preprocedural respiratory examination: Secondary | ICD-10-CM | POA: Diagnosis not present

## 2021-08-29 DIAGNOSIS — R0600 Dyspnea, unspecified: Secondary | ICD-10-CM | POA: Diagnosis not present

## 2021-08-29 LAB — BLOOD GAS, ARTERIAL
Acid-Base Excess: 2.8 mmol/L — ABNORMAL HIGH (ref 0.0–2.0)
Bicarbonate: 28.8 mmol/L — ABNORMAL HIGH (ref 20.0–28.0)
Drawn by: 29503
FIO2: 32
O2 Content: 3 L/min
O2 Saturation: 97.6 %
Patient temperature: 98.6
pCO2 arterial: 54.5 mmHg — ABNORMAL HIGH (ref 32.0–48.0)
pH, Arterial: 7.343 — ABNORMAL LOW (ref 7.350–7.450)
pO2, Arterial: 101 mmHg (ref 83.0–108.0)

## 2021-08-29 LAB — CBC WITH DIFFERENTIAL/PLATELET
Abs Immature Granulocytes: 0.12 10*3/uL — ABNORMAL HIGH (ref 0.00–0.07)
Basophils Absolute: 0 10*3/uL (ref 0.0–0.1)
Basophils Relative: 0 %
Eosinophils Absolute: 0 10*3/uL (ref 0.0–0.5)
Eosinophils Relative: 0 %
HCT: 34.1 % — ABNORMAL LOW (ref 39.0–52.0)
Hemoglobin: 10.1 g/dL — ABNORMAL LOW (ref 13.0–17.0)
Immature Granulocytes: 1 %
Lymphocytes Relative: 2 %
Lymphs Abs: 0.3 10*3/uL — ABNORMAL LOW (ref 0.7–4.0)
MCH: 26.7 pg (ref 26.0–34.0)
MCHC: 29.6 g/dL — ABNORMAL LOW (ref 30.0–36.0)
MCV: 90.2 fL (ref 80.0–100.0)
Monocytes Absolute: 0.8 10*3/uL (ref 0.1–1.0)
Monocytes Relative: 5 %
Neutro Abs: 14.7 10*3/uL — ABNORMAL HIGH (ref 1.7–7.7)
Neutrophils Relative %: 92 %
Platelets: 332 10*3/uL (ref 150–400)
RBC: 3.78 MIL/uL — ABNORMAL LOW (ref 4.22–5.81)
RDW: 14.4 % (ref 11.5–15.5)
WBC: 16 10*3/uL — ABNORMAL HIGH (ref 4.0–10.5)
nRBC: 0 % (ref 0.0–0.2)

## 2021-08-29 LAB — BASIC METABOLIC PANEL
Anion gap: 5 (ref 5–15)
BUN: 40 mg/dL — ABNORMAL HIGH (ref 8–23)
CO2: 30 mmol/L (ref 22–32)
Calcium: 9.2 mg/dL (ref 8.9–10.3)
Chloride: 103 mmol/L (ref 98–111)
Creatinine, Ser: 1.32 mg/dL — ABNORMAL HIGH (ref 0.61–1.24)
GFR, Estimated: 57 mL/min — ABNORMAL LOW (ref >60)
Glucose, Bld: 145 mg/dL — ABNORMAL HIGH (ref 70–99)
Potassium: 4.6 mmol/L (ref 3.5–5.1)
Sodium: 138 mmol/L (ref 135–145)

## 2021-08-29 LAB — PROCALCITONIN: Procalcitonin: 1.18 ng/mL

## 2021-08-29 LAB — STREP PNEUMONIAE URINARY ANTIGEN: Strep Pneumo Urinary Antigen: NEGATIVE

## 2021-08-29 LAB — MAGNESIUM: Magnesium: 2.2 mg/dL (ref 1.7–2.4)

## 2021-08-29 MED ORDER — DEXTROSE IN LACTATED RINGERS 5 % IV SOLN: INTRAVENOUS | Status: DC

## 2021-08-29 NOTE — Progress Notes (Signed)
° °  Recent Labs  Lab 08/29/21 1329  PHART 7.343*  PCO2ART 54.5*  PO2ART 101  HCO3 28.8*  O2SAT 97.6    ABG shows chronic hypercapnia.  I discussed this with him.  Will start DreamStation CPAP/BiPAP on him tonight.  No need to transfer for this out of the floor.  I have updated Dr. Valeta Harms about this.  He will need an assessment prior to discharge and if he continues to have hypercapnia then he will need some kind of noninvasive ventilation to go home with.  He is understanding.     SIGNATURE    Dr. Brand Males, M.D., F.C.C.P,  Pulmonary and Critical Care Medicine Staff Physician, [facility] Director - Interstitial Lung Disease  Program  Pulmonary [place] at [place], [place], [address]  NPI Number:  NPI #[id number]  Pager: [phone], If no answer  -> Check AMION or Try [phone] Telephone (clinical office): [phone] Telephone (research): [phone]  7:03 PM 08/29/2021'

## 2021-08-29 NOTE — Progress Notes (Signed)
Patient had a nine beat run of SVT HR went up to the 150s. Checked on patient and he was getting back in bed. HR is in the 80-90s now patient resting comfortably. MD notified no new orders obtained. Will continue to monitor.
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
## 2021-08-29 NOTE — Plan of Care (Signed)
°  Problem: Nutrition: Goal: Adequate nutrition will be maintained Outcome: Progressing   Problem: Pain Managment: Goal: General experience of comfort will improve Outcome: Progressing   Problem: Elimination: Goal: Will not experience complications related to bowel motility Outcome: Progressing Goal: Will not experience complications related to urinary retention Outcome: Progressing
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
## 2021-08-30 ENCOUNTER — Inpatient Hospital Stay (HOSPITAL_COMMUNITY): Admit: 2021-08-30 | Payer: Medicare Other | Admitting: Pulmonary Disease

## 2021-08-30 ENCOUNTER — Encounter (HOSPITAL_COMMUNITY): Payer: Self-pay | Admitting: Internal Medicine

## 2021-08-30 ENCOUNTER — Other Ambulatory Visit: Payer: Self-pay

## 2021-08-30 ENCOUNTER — Inpatient Hospital Stay (HOSPITAL_COMMUNITY): Payer: Medicare Other

## 2021-08-30 ENCOUNTER — Inpatient Hospital Stay (HOSPITAL_COMMUNITY): Payer: Medicare Other | Admitting: Anesthesiology

## 2021-08-30 ENCOUNTER — Encounter (HOSPITAL_COMMUNITY)
Admission: EM | Disposition: A | Discharge status: Home / Self Care | Payer: Self-pay | Source: Home / Self Care | Attending: Internal Medicine

## 2021-08-30 DIAGNOSIS — J90 Pleural effusion, not elsewhere classified: Secondary | ICD-10-CM | POA: Insufficient documentation

## 2021-08-30 DIAGNOSIS — R0600 Dyspnea, unspecified: Secondary | ICD-10-CM | POA: Diagnosis not present

## 2021-08-30 DIAGNOSIS — J189 Pneumonia, unspecified organism: Secondary | ICD-10-CM | POA: Diagnosis not present

## 2021-08-30 DIAGNOSIS — J9601 Acute respiratory failure with hypoxia: Secondary | ICD-10-CM | POA: Diagnosis not present

## 2021-08-30 DIAGNOSIS — R918 Other nonspecific abnormal finding of lung field: Secondary | ICD-10-CM | POA: Diagnosis present

## 2021-08-30 HISTORY — PX: BRONCHIAL NEEDLE ASPIRATION BIOPSY: SHX5106

## 2021-08-30 HISTORY — PX: BRONCHIAL BIOPSY: SHX5109

## 2021-08-30 HISTORY — PX: BRONCHIAL WASHINGS: SHX5105

## 2021-08-30 LAB — COMPREHENSIVE METABOLIC PANEL
ALT: 25 U/L (ref 0–44)
AST: 23 U/L (ref 15–41)
Albumin: 2.5 g/dL — ABNORMAL LOW (ref 3.5–5.0)
Alkaline Phosphatase: 73 U/L (ref 38–126)
Anion gap: 6 (ref 5–15)
BUN: 40 mg/dL — ABNORMAL HIGH (ref 8–23)
CO2: 29 mmol/L (ref 22–32)
Calcium: 9.3 mg/dL (ref 8.9–10.3)
Chloride: 103 mmol/L (ref 98–111)
Creatinine, Ser: 1.19 mg/dL (ref 0.61–1.24)
GFR, Estimated: >60 mL/min (ref >60)
Glucose, Bld: 146 mg/dL — ABNORMAL HIGH (ref 70–99)
Potassium: 4.7 mmol/L (ref 3.5–5.1)
Sodium: 138 mmol/L (ref 135–145)
Total Bilirubin: 0.4 mg/dL (ref 0.3–1.2)
Total Protein: 6.7 g/dL (ref 6.5–8.1)

## 2021-08-30 LAB — PROTIME-INR
INR: 1 (ref 0.8–1.2)
Prothrombin Time: 13.2 seconds (ref 11.4–15.2)

## 2021-08-30 LAB — BODY FLUID CELL COUNT WITH DIFFERENTIAL: Total Nucleated Cell Count, Fluid: 9 cu mm (ref 0–1000)

## 2021-08-30 LAB — CBC
HCT: 34.7 % — ABNORMAL LOW (ref 39.0–52.0)
Hemoglobin: 10.3 g/dL — ABNORMAL LOW (ref 13.0–17.0)
MCH: 26.7 pg (ref 26.0–34.0)
MCHC: 29.7 g/dL — ABNORMAL LOW (ref 30.0–36.0)
MCV: 89.9 fL (ref 80.0–100.0)
Platelets: 329 10*3/uL (ref 150–400)
RBC: 3.86 MIL/uL — ABNORMAL LOW (ref 4.22–5.81)
RDW: 14.3 % (ref 11.5–15.5)
WBC: 14.7 10*3/uL — ABNORMAL HIGH (ref 4.0–10.5)
nRBC: 0 % (ref 0.0–0.2)

## 2021-08-30 LAB — TROPONIN I (HIGH SENSITIVITY): Troponin I (High Sensitivity): 5 ng/L (ref <18)

## 2021-08-30 LAB — BRAIN NATRIURETIC PEPTIDE: B Natriuretic Peptide: 250.4 pg/mL — ABNORMAL HIGH (ref 0.0–100.0)

## 2021-08-30 SURGERY — BRONCHOSCOPY, WITH BIOPSY USING ELECTROMAGNETIC NAVIGATION
Anesthesia: General | Laterality: Left

## 2021-08-30 MED ORDER — LIDOCAINE 2% (20 MG/ML) 5 ML SYRINGE
INTRAMUSCULAR | Status: DC | PRN
Start: 2021-08-30 — End: 2021-08-30
  Administered 2021-08-30: 80 mg via INTRAVENOUS

## 2021-08-30 MED ORDER — PHENYLEPHRINE 40 MCG/ML (10ML) SYRINGE FOR IV PUSH (FOR BLOOD PRESSURE SUPPORT)
PREFILLED_SYRINGE | INTRAVENOUS | Status: DC | PRN
  Administered 2021-08-30: 80 ug via INTRAVENOUS

## 2021-08-30 MED ORDER — ACETAMINOPHEN 500 MG PO TABS
ORAL_TABLET | ORAL | Status: AC
  Administered 2021-08-30: 1000 mg via ORAL
  Filled 2021-08-30: qty 2

## 2021-08-30 MED ORDER — PROPOFOL 10 MG/ML IV BOLUS
INTRAVENOUS | Status: DC | PRN
  Administered 2021-08-30: 140 mg via INTRAVENOUS

## 2021-08-30 MED ORDER — SODIUM CHLORIDE 0.9 % IV SOLN
2.0000 g | INTRAVENOUS | Status: AC
  Administered 2021-08-30: 2 g via INTRAVENOUS
  Filled 2021-08-30: qty 2

## 2021-08-30 MED ORDER — ACETAMINOPHEN 500 MG PO TABS: 1000.0000 mg | ORAL_TABLET | Freq: Once | ORAL | Status: AC

## 2021-08-30 MED ORDER — LACTATED RINGERS IV SOLN: INTRAVENOUS | Status: DC

## 2021-08-30 MED ORDER — PHENYLEPHRINE HCL-NACL 20-0.9 MG/250ML-% IV SOLN
INTRAVENOUS | Status: DC | PRN
  Administered 2021-08-30: 25 ug/min via INTRAVENOUS

## 2021-08-30 MED ORDER — ROCURONIUM BROMIDE 10 MG/ML (PF) SYRINGE
PREFILLED_SYRINGE | INTRAVENOUS | Status: DC | PRN
  Administered 2021-08-30: 10 mg via INTRAVENOUS
  Administered 2021-08-30: 50 mg via INTRAVENOUS

## 2021-08-30 MED ORDER — SUGAMMADEX SODIUM 200 MG/2ML IV SOLN
INTRAVENOUS | Status: DC | PRN
  Administered 2021-08-30: 200 mg via INTRAVENOUS

## 2021-08-30 MED ORDER — FENTANYL CITRATE (PF) 100 MCG/2ML IJ SOLN: 25.0000 ug | INTRAMUSCULAR | Status: DC | PRN

## 2021-08-30 MED ORDER — CHLORHEXIDINE GLUCONATE 0.12 % MT SOLN
OROMUCOSAL | Status: AC
  Administered 2021-08-30: 15 mL via OROMUCOSAL
  Filled 2021-08-30: qty 15

## 2021-08-30 MED ORDER — FENTANYL CITRATE (PF) 250 MCG/5ML IJ SOLN
INTRAMUSCULAR | Status: DC | PRN
  Administered 2021-08-30: 75 ug via INTRAVENOUS

## 2021-08-30 MED ORDER — DEXAMETHASONE SODIUM PHOSPHATE 10 MG/ML IJ SOLN
INTRAMUSCULAR | Status: DC | PRN
  Administered 2021-08-30: 10 mg via INTRAVENOUS

## 2021-08-30 MED ORDER — CHLORHEXIDINE GLUCONATE 0.12 % MT SOLN: 15.0000 mL | OROMUCOSAL | Status: AC

## 2021-08-30 MED ORDER — ONDANSETRON HCL 4 MG/2ML IJ SOLN
INTRAMUSCULAR | Status: DC | PRN
Start: 2021-08-30 — End: 2021-08-30
  Administered 2021-08-30: 4 mg via INTRAVENOUS

## 2021-08-30 MED ORDER — ONDANSETRON HCL 4 MG/2ML IJ SOLN: 4.0000 mg | Freq: Once | INTRAMUSCULAR | Status: DC | PRN

## 2021-08-30 NOTE — Plan of Care (Signed)
  Problem: Education: Goal: Knowledge of General Education information will improve Description: Including pain rating scale, medication(s)/side effects and non-pharmacologic comfort measures Outcome: Progressing   Problem: Activity: Goal: Risk for activity intolerance will decrease Outcome: Progressing   

## 2021-08-30 NOTE — Progress Notes (Signed)
Pt states he doesn't like the way the cpap feels and he doesn't want to wear it tonight. Advised to give it try but pt refused cpap tonight. Machine remained bedside if needed.

## 2021-08-30 NOTE — Progress Notes (Signed)
Brian Ramirez is an 76 y.o. male.   Chief Complaint: Progressive dyspnea and weakness. HPI: The patient is a 76 yr old man who carries a diagnosis for Non-small cell lung CA. He has been undergoing treatment with Dr. [name] since his diagnosis in 08/2018. He had Alimta/carboplatin/pembrolizumab . He continued pembrolizumab for the next two years. This therapy was resumed in 07/2021. His last treatment was 08/20/2021.   The patient presented to his visit with complaints of progressive dyspnea and weakness over the past week. He presented to [facility] ED on the evening of 08/25/2021 and was following up with Dr. [name] today. CT chest of 07/26/2021 demonstrated a progressive ill-defined nodular airspace process in the left lower lobe with hazy groundglass opacity. There was also slight interstitial thickening of the left upper lobe. There was improved appearance of the rightr upper loabe. There was a stable thick-walled loculated right pleural fluid collection. There is a new small left pleural effusion.   Medical history is significant for anemia of chronic disease, Chronic CHF with Grade 1 diastolic dysfunction and EF of 50-55% as of echocardiogram of 03/11/2019, Atrial fibrillation/flutter, no anticoagulation, anemia of chronic diseaes, CAD of native artery, essential hypertension, non-small cell lung CA, Moderate protein calorie malnutrition, chronic pleural effusion.  [facility] hospitalists have been consulted to admit the patient for further evaluation and treatment.   The patient has been admitted to a telemetry bed. He will be treated with IV cefepime and vancomycin. Sputum cultures and procalcitonin will be obtained.   Pulmonology has been consulted. They have started steroids for possible pneumonitis.   Plan is for thoracentesis.  Past Medical History:  Diagnosis Date   Acute congestive heart failure ([place])    Anemia of chronic disease 10/08/2018   Anticoagulated    Atrial flutter with rapid  ventricular response ([place]) 10/08/2018   CAD in native artery 06/01/2021   Difficult airway for intubation 09/26/2016   Difficult intubation    Jaw only opens up half way to intubate.   Edema of both lower extremities 10/09/2018   Essential hypertension 06/01/2021   Exertional dyspnea 06/01/2021   Goals of care, counseling/discussion 09/20/2018   Hernia 2012   History of hiatal hernia    History of kidney stones    Left inguinal hernia s/p lap repair 11/22/2016 09/26/2016   Lung cancer ([place]) 09/20/2018   Lung mass 09/04/2018   Malnutrition of moderate degree 09/20/2018   Mass of right lung 09/04/2018   New onset atrial fibrillation ([place])    Personal history of kidney stones 1992   Pleural effusion 09/04/2018   Pleural effusion on right    Pressure injury of skin 09/20/2018   SOB (shortness of breath) 09/19/2018    Past Surgical History:  Procedure Laterality Date   HERNIA REPAIR  2012   inguinal   INGUINAL HERNIA REPAIR Left 11/22/2016   Procedure: LAPAROSCOPIC  REPAIR OF LEFT INGUINAL HERNIA WITH MESH;  Surgeon: [name], MD;  Location: [facility];  Service: General;  Laterality: Left;   IR INSTILL VIA CHEST TUBE AGENT FOR FIBRINOLYSIS INI DAY  10/10/2018   IR INSTILL VIA CHEST TUBE AGENT FOR FIBRINOLYSIS INI DAY  10/31/2018   IR PERC PLEURAL DRAIN W/INDWELL CATH W/IMG GUIDE  09/11/2018   IR REMOVAL OF PLURAL CATH W/CUFF  11/13/2018   IR THORACENTESIS ASP PLEURAL SPACE W/IMG GUIDE  07/06/2021    Family History  Problem Relation Age of Onset   Heart disease Father    Cancer Brother  Social History:  reports that he has never smoked. He has never used smokeless tobacco. He reports current alcohol use of about 1.0 standard drink per week. He reports that he does not use drugs. Medications Prior to Admission  Medication Sig Dispense Refill   aspirin EC 81 MG tablet Take 1 tablet (81 mg total) by mouth daily. 90 tablet 3   Artificial Tear Ointment (DRY EYES OP) Apply 1 drop to  eye as needed (Dry, itching eyes).     Tiotropium Bromide-Olodaterol (STIOLTO RESPIMAT) 2.5-2.5 MCG/ACT AERS Inhale 2 puffs into the lungs daily. (Patient not taking: Reported on 08/23/2021) 4 g 0    Allergies: No Known Allergies  Review of Systems - Negative except for those elements included in the HPI above.    Exam:  Constitutional:  The patient is awake, alert, and oriented x 3. No acute distress. Respiratory:  No increased work of breathing. There are rales in the left chest  No wheezes or rhonchi No tactile fremitus Cardiovascular:  Regular rate and rhythm No murmurs, ectopy, or gallups. No lateral PMI. No thrills. Abdomen:  Abdomen is soft, non-tender, non-distended No hernias, masses, or organomegaly Normoactive bowel sounds.  Musculoskeletal:  No cyanosis, clubbing, or edema Skin:  No rashes, lesions, ulcers palpation of skin: no induration or nodules Neurologic:  CN 2-12 intact Sensation all 4 extremities intact Psychiatric:  Mental status Mood, affect appropriate Orientation to person, place, time  judgment and insight appear intact   Results for orders placed or performed during the hospital encounter of 08/25/21 (from the past 48 hour(s))  Procalcitonin     Status: None   Collection Time: 08/29/21  5:21 AM  Result Value Ref Range   Procalcitonin 1.18 ng/mL    Comment:        Interpretation: PCT > 0.5 ng/mL and <= 2 ng/mL: Systemic infection (sepsis) is possible, but other conditions are known to elevate PCT as well. (NOTE)       Sepsis PCT Algorithm           Lower Respiratory Tract                                      Infection PCT Algorithm    ----------------------------     ----------------------------         PCT < 0.25 ng/mL                PCT < 0.10 ng/mL          Strongly encourage             Strongly discourage   discontinuation of antibiotics    initiation of antibiotics    ----------------------------      -----------------------------       PCT 0.25 - 0.50 ng/mL            PCT 0.10 - 0.25 ng/mL               OR       >80% decrease in PCT            Discourage initiation of                                            antibiotics      Encourage discontinuation  of antibiotics    ----------------------------     -----------------------------         PCT >= 0.50 ng/mL              PCT 0.26 - 0.50 ng/mL                AND       <80% decrease in PCT             Encourage initiation of                                             antibiotics       Encourage continuation           of antibiotics    ----------------------------     -----------------------------        PCT >= 0.50 ng/mL                  PCT > 0.50 ng/mL               AND         increase in PCT                  Strongly encourage                                      initiation of antibiotics    Strongly encourage escalation           of antibiotics                                     -----------------------------                                           PCT <= 0.25 ng/mL                                                 OR                                        > 80% decrease in PCT                                      Discontinue / Do not initiate                                             antibiotics  Performed at [place] [address], [place], [place] [address]   Strep pneumoniae urinary antigen     Status: None   Collection Time: 08/29/21  6:00 AM  Result Value Ref Range   Strep Pneumo Urinary Antigen NEGATIVE  NEGATIVE    Comment:        Infection due to S. pneumoniae cannot be absolutely ruled out since the antigen present may be below the detection limit of the test. PERFORMED AT [facility] Performed at [facility], [place] [address], [place], [place] [address]   Basic metabolic panel     Status: Abnormal   Collection Time: 08/29/21 12:27 PM  Result Value Ref  Range   Sodium 138 135 - 145 mmol/L   Potassium 4.6 3.5 - 5.1 mmol/L   Chloride 103 98 - 111 mmol/L   CO2 30 22 - 32 mmol/L   Glucose, Bld 145 (H) 70 - 99 mg/dL    Comment: Glucose reference range applies only to samples taken after fasting for at least 8 hours.   BUN 40 (H) 8 - 23 mg/dL   Creatinine, Ser 1.32 (H) 0.61 - 1.24 mg/dL   Calcium 9.2 8.9 - 10.3 mg/dL   GFR, Estimated 57 (L) >60 mL/min    Comment: (NOTE) Calculated using the CKD-EPI Creatinine Equation (2021)    Anion gap 5 5 - 15    Comment: Performed at [facility], [place] [address], [place], [place] [address]  Magnesium     Status: None   Collection Time: 08/29/21 12:27 PM  Result Value Ref Range   Magnesium 2.2 1.7 - 2.4 mg/dL    Comment: Performed at [facility], [place] [address], [place], [place] [address]  CBC with Differential/Platelet     Status: Abnormal   Collection Time: 08/29/21 12:27 PM  Result Value Ref Range   WBC 16.0 (H) 4.0 - 10.5 K/uL   RBC 3.78 (L) 4.22 - 5.81 MIL/uL   Hemoglobin 10.1 (L) 13.0 - 17.0 g/dL   HCT 34.1 (L) 39.0 - 52.0 %   MCV 90.2 80.0 - 100.0 fL   MCH 26.7 26.0 - 34.0 pg   MCHC 29.6 (L) 30.0 - 36.0 g/dL   RDW 14.4 11.5 - 15.5 %   Platelets 332 150 - 400 K/uL   nRBC 0.0 0.0 - 0.2 %   Neutrophils Relative % 92 %   Neutro Abs 14.7 (H) 1.7 - 7.7 K/uL   Lymphocytes Relative 2 %   Lymphs Abs 0.3 (L) 0.7 - 4.0 K/uL   Monocytes Relative 5 %   Monocytes Absolute 0.8 0.1 - 1.0 K/uL   Eosinophils Relative 0 %   Eosinophils Absolute 0.0 0.0 - 0.5 K/uL   Basophils Relative 0 %   Basophils Absolute 0.0 0.0 - 0.1 K/uL   Immature Granulocytes 1 %   Abs Immature Granulocytes 0.12 (H) 0.00 - 0.07 K/uL    Comment: Performed at [facility], [place] [address] [place], [place] [address]  Blood gas, arterial     Status: Abnormal   Collection Time: 08/29/21  1:29 PM  Result Value Ref Range   FIO2 32.00    O2 Content 3.0 L/min   Delivery  systems NASAL CANNULA    pH, Arterial 7.343 (L) 7.350 - 7.450   pCO2 arterial 54.5 (H) 32.0 - 48.0 mmHg   pO2, Arterial 101 83.0 - 108.0 mmHg   Bicarbonate 28.8 (H) 20.0 - 28.0 mmol/L   Acid-Base Excess 2.8 (H) 0.0 - 2.0 mmol/L   O2 Saturation 97.6 %   Patient temperature 98.6    Collection site RIGHT RADIAL    Drawn by [id number]    Allens test (pass/fail) PASS PASS    Comment:  Performed at [facility], [place] [address], [place], [place] [address]  Expectorated Sputum Assessment w Gram Stain, Rflx to Resp Cult     Status: None (Preliminary result)   Collection Time: 08/29/21  8:00 PM   Specimen: Sputum  Result Value Ref Range   Specimen Description SPUTUM    Special Requests NONE    Sputum evaluation      THIS SPECIMEN IS ACCEPTABLE FOR SPUTUM CULTURE Performed at [facility] [place] [address] [place], [place] [address]    Report Status PENDING   Culture, Respiratory w Gram Stain     Status: None (Preliminary result)   Collection Time: 08/29/21  8:00 PM   Specimen: SPU  Result Value Ref Range   Specimen Description      SPUTUM Performed at [place] [address] [place], [place] [address]    Special Requests      NONE Reflexed from [id number] Performed at [facility], [place] [address], [place], [place] [address]    Gram Stain      NO SQUAMOUS EPITHELIAL CELLS SEEN FEW WBC SEEN FEW GRAM POSITIVE RODS FEW GRAM POSITIVE COCCI Performed at [facility], [place] [address] [place] [address], [place], [place] [address]    Culture PENDING    Report Status PENDING   CBC     Status: Abnormal   Collection Time: 08/30/21  4:51 AM  Result Value Ref Range   WBC 14.7 (H) 4.0 - 10.5 K/uL   RBC 3.86 (L) 4.22 - 5.81 MIL/uL   Hemoglobin 10.3 (L) 13.0 - 17.0 g/dL   HCT 34.7 (L) 39.0 - 52.0 %   MCV 89.9 80.0 - 100.0 fL   MCH 26.7 26.0 - 34.0 pg   MCHC 29.7 (L) 30.0 - 36.0 g/dL   RDW 14.3 11.5 - 15.5 %   Platelets 329 150 -  400 K/uL   nRBC 0.0 0.0 - 0.2 %    Comment: Performed at [facility], [place] [address], [place], [place] [address]  Comprehensive metabolic panel     Status: Abnormal   Collection Time: 08/30/21  4:51 AM  Result Value Ref Range   Sodium 138 135 - 145 mmol/L   Potassium 4.7 3.5 - 5.1 mmol/L   Chloride 103 98 - 111 mmol/L   CO2 29 22 - 32 mmol/L   Glucose, Bld 146 (H) 70 - 99 mg/dL    Comment: Glucose reference range applies only to samples taken after fasting for at least 8 hours.   BUN 40 (H) 8 - 23 mg/dL   Creatinine, Ser 1.19 0.61 - 1.24 mg/dL   Calcium 9.3 8.9 - 10.3 mg/dL   Total Protein 6.7 6.5 - 8.1 g/dL   Albumin 2.5 (L) 3.5 - 5.0 g/dL   AST 23 15 - 41 U/L   ALT 25 0 - 44 U/L   Alkaline Phosphatase 73 38 - 126 U/L   Total Bilirubin 0.4 0.3 - 1.2 mg/dL   GFR, Estimated >60 >60 mL/min    Comment: (NOTE) Calculated using the CKD-EPI Creatinine Equation (2021)    Anion gap 6 5 - 15    Comment: Performed at [facility], [place] [address] [place],  [address]  Brain natriuretic peptide     Status: Abnormal   Collection Time: 08/30/21  4:51 AM  Result Value Ref Range   B Natriuretic Peptide 250.4 (H) 0.0 - 100.0 pg/mL    Comment: Performed at  [facility], [place] [address] [place], [place] [address]  Protime-INR     Status: None   Collection Time: 08/30/21  4:51 AM  Result Value Ref Range   Prothrombin Time 13.2 11.4 - 15.2 seconds   INR 1.0 0.8 - 1.2    Comment: (NOTE) INR goal varies based on device and disease states. Performed at [facility], [place] [address], [place], [place] [address]   Troponin I (High Sensitivity)     Status: None   Collection Time: 08/30/21  4:51 AM  Result Value Ref Range   Troponin I (High Sensitivity) 5 <18 ng/L    Comment: (NOTE) Elevated high sensitivity troponin I (hsTnI) values and significant  changes across serial measurements may suggest ACS but many other   chronic and acute conditions are known to elevate hsTnI results.  Refer to the "Links" section for chest pain algorithms and additional  guidance. Performed at [facility], [place] [address] [place], [place] [address]   Body fluid cell count with differential     Status: Abnormal   Collection Time: 08/30/21 11:55 AM  Result Value Ref Range   Fluid Type-FCT BAL     Comment: CORRECTED ON 01/16 AT 1330: PREVIOUSLY REPORTED AS [place]   Color, Fluid COLORLESS (A) YELLOW   Appearance, Fluid CLEAR (A) CLEAR   Total Nucleated Cell Count, Fluid 9 0 - 1,000 cu mm   Other Cells, Fluid TOO FEW TO COUNT, SMEAR AVAILABLE FOR REVIEW %    Comment: RARE NEUTROPHILS AND MONOS/MACROPHAGES BRONCHIAL LINING CELLS PRESENT Performed at [facility], [place] [address] [place], [place] [address]    _0 @  Blood pressure (!) 166/82, pulse (!) 57, temperature 98 F (36.7 C), resp. rate 20, height _1  (1.854 m), weight 64.4 kg, SpO2 100 %.    Assessment/Plan  Progressive dyspnea and weakness: DDx: Pneumonitis secondary to resumption of pembrolizumab; pneumonia. The patient is started on IV cefepme and IV Vancomycin. The patient underwent bronchoscopy with Dr. Valeta Harms BAL of LUL was sent for cytology, biopsies and brushings from the LUL infiltrate.  Non-small cell CA: Patient's last dose of pembrolizumab was 08/20/2021. Concern for malignant pleural effusion and malignant nodule per pulmonary.  Anemia of chronic disease: Due to chronic illness and likely nutritional causes. Will check iron studies.   Atrial fibrillation/flutter: Monitor on telemetry. On no nodal agents or anticoagulation at home.   CHF: As per echocardiogram from 07/27/2019 the patient had an EF of 50-55% and Grade 1 diastolic dysfunction. Repeat echocardiogram. Monitor on telemetry.  CKD II: Monitor creatinine, electrolytes, and volume status. Avoid hypotension and nephrotoxic agents.   CAD: Continue ASA  81 mg as at home.  Moderate Protein Calorie Malnutrition: Diet has been liberated. Will add Ensure as supplement and consult nutrition.  I have seen and examined this patient myself. I have spent 32 minutes in his evaluation and care.   Pulmonology and oncology have been consulted.  DVT Prophylaxis: Lovenox CODE STATUS: Full Code Family Communications: None available Disposition: tbd  Jozi Malachi 08/30/2021, 6:23 PM

## 2021-08-30 NOTE — Op Note (Signed)
Video Bronchoscopy with Robotic Assisted Bronchoscopic Navigation   Date of Operation: 08/30/2021   Pre-op Diagnosis: Bilateral infiltrates  Post-op Diagnosis: Bilateral infiltrates  Surgeon: Garner Nash, DO  Assistants: none   Anesthesia: General endotracheal anesthesia  Operation: Flexible video fiberoptic bronchoscopy with robotic assistance and biopsies.  Estimated Blood Loss: Minimal  Complications: None  Indications and History: Brian Ramirez is a 76 y.o. male with history of malignancy, on immunotherapy now with bilateral infiltrates, worsening. The risks, benefits, complications, treatment options and expected outcomes were discussed with the patient.  The possibilities of pneumothorax, pneumonia, reaction to medication, pulmonary aspiration, perforation of a viscus, bleeding, failure to diagnose a condition and creating a complication requiring transfusion or operation were discussed with the patient who freely signed the consent.    Description of Procedure: The patient was seen in the Preoperative Area, was examined and was deemed appropriate to proceed.  The patient was taken to [facility] endoscopy room 3, identified as Brian Ramirez and the procedure verified as Flexible Video Fiberoptic Bronchoscopy.  A Time Out was held and the above information confirmed.   Prior to the date of the procedure a high-resolution CT scan of the chest was performed. Utilizing ION software program a virtual tracheobronchial tree was generated to allow the creation of distinct navigation pathways to the patient's parenchymal abnormalities. After being taken to the operating room general anesthesia was initiated and the patient  was orally intubated. The video fiberoptic bronchoscope was introduced via the endotracheal tube and a general inspection was performed which showed normal right and left lung anatomy, aspiration of the bilateral mainstems was completed to remove any remaining  secretions. Robotic catheter inserted into patient's endotracheal tube.   Target #1 LUL infiltrate: The distinct navigation pathways prepared prior to this procedure were then utilized to navigate to patient's lesion identified on CT scan. The robotic catheter was secured into place and the vision probe was withdrawn.  Lesion location was approximated using fluoroscopy for peripheral targeting.  Prior to biopsy is a BAL was completed to be sent for cell count and differential.  Under fluoroscopic guidance transbronchial needle brushings, and transbronchial forceps biopsies were performed to be sent for cytology and pathology. A bronchioalveolar lavage was performed in the LUL and sent for cytology, as well as cultures.  At the end of the procedure a general airway inspection was performed and there was no evidence of active bleeding. The bronchoscope was removed.  The patient tolerated the procedure well. There was no significant blood loss and there were no obvious complications. A post-procedural chest x-ray is pending.  Samples Target #1: 1. Transbronchial needle brushings from left upper lobe infiltrate 2. Transbronchial forceps biopsies from left upper lobe infiltrate 3. Bronchoalveolar lavage from left upper lobe  Plans:  The patient will be discharged from the PACU to home when recovered from anesthesia and after chest x-ray is reviewed. We will review the cytology, pathology and microbiology results with the patient when they become available. Outpatient followup will be with Garner Nash, DO, or Dr. Ammie Dalton.   Garner Nash, DO [facility] 08/30/2021 12:24 PM

## 2021-08-30 NOTE — Anesthesia Preprocedure Evaluation (Addendum)
Anesthesia Evaluation  Patient identified by MRN, date of birth, ID band Patient awake    Reviewed: Allergy & Precautions, NPO status , Patient's Chart, lab work & pertinent test results  History of Anesthesia Complications (+) DIFFICULT AIRWAY and history of anesthetic complications  Airway Mallampati: II  TM Distance: <3 FB Neck ROM: Full  Mouth opening: Limited Mouth Opening  Dental  (+) Teeth Intact, Dental Advisory Given   Pulmonary  Recurrent right pleural effusion Nodule of lower lobe of left lung   Decreased breath sounds throughout the right chest, diminished breath sounds with scattered rales at the left chest   + decreased breath sounds  rales    Cardiovascular hypertension, + CAD and +CHF  Normal cardiovascular exam+ dysrhythmias Atrial Fibrillation  Rhythm:Regular Rate:Normal     Neuro/Psych negative neurological ROS  negative psych ROS   GI/Hepatic Neg liver ROS, hiatal hernia,   Endo/Other  negative endocrine ROS  Renal/GU negative Renal ROS     Musculoskeletal negative musculoskeletal ROS (+)   Abdominal   Peds  Hematology  (+) Blood dyscrasia, anemia ,   Anesthesia Other Findings Day of surgery medications reviewed with the patient.  Reproductive/Obstetrics                            Anesthesia Physical Anesthesia Plan  ASA: 3  Anesthesia Plan: General   Post-op Pain Management: Tylenol PO (pre-op)   Induction: Intravenous  PONV Risk Score and Plan: 2 and Dexamethasone and Ondansetron  Airway Management Planned: Oral ETT and Video Laryngoscope Planned  Additional Equipment:   Intra-op Plan:   Post-operative Plan: Extubation in OR  Informed Consent: I have reviewed the patients History and Physical, chart, labs and discussed the procedure including the risks, benefits and alternatives for the proposed anesthesia with the patient or authorized representative  who has indicated his/her understanding and acceptance.     Dental advisory given  Plan Discussed with: CRNA  Anesthesia Plan Comments:         Anesthesia Quick Evaluation

## 2021-08-30 NOTE — Discharge Instructions (Addendum)
Flexible Bronchoscopy, Care After This sheet gives you information about how to care for yourself after your test. Your doctor may also give you more specific instructions. If you have problems or questions, contact your doctor. Follow these instructions at home: Eating and drinking Do not eat or drink anything (not even water) for 2 hours after your test, or until your numbing medicine (local anesthetic) wears off. When your numbness is gone and your cough and gag reflexes have come back, you may: Eat only soft foods. Slowly drink liquids. The day after the test, go back to your normal diet. Driving Do not drive for 24 hours if you were given a medicine to help you relax (sedative). Do not drive or use heavy machinery while taking prescription pain medicine. General instructions  Take over-the-counter and prescription medicines only as told by your doctor. Return to your normal activities as told. Ask what activities are safe for you. Do not use any products that have nicotine or tobacco in them. This includes cigarettes and e-cigarettes. If you need help quitting, ask your doctor. Keep all follow-up visits as told by your doctor. This is important. It is very important if you had a tissue sample (biopsy) taken. Get help right away if: You have shortness of breath that gets worse. You get light-headed. You feel like you are going to pass out (faint). You have chest pain. You cough up: More than a little blood. More blood than before. Summary Do not eat or drink anything (not even water) for 2 hours after your test, or until your numbing medicine wears off. Do not use cigarettes. Do not use e-cigarettes. Get help right away if you have chest pain.  This information is not intended to replace advice given to you by your health care provider. Make sure you discuss any questions you have with your health care provider. Document Released: 05/29/2009 Document Revised: 07/14/2017 Document  Reviewed: 08/19/2016 Elsevier Patient Education  [place]: Follow up with Dr. [name] on 09/03/21 at 11:00 am to go over pathology results.

## 2021-08-30 NOTE — Progress Notes (Addendum)
PT Cancellation Note  Patient Details Name: Brian Ramirez MRN: [id number] DOB: January 30, 1946   Cancelled Treatment:    Reason Eval/Treat Not Completed: Patient at procedure or test/unavailable (Patient at [facility] for procedure. Will follow up at later date/time as schedule allows.)  Gwynneth Albright PT, DPT Acute Rehabilitation Services Office [phone] Pager [phone]

## 2021-08-30 NOTE — Progress Notes (Addendum)
IP PROGRESS NOTE  Subjective:   Respiratory status stable.  No new complaints.  Objective: Vital signs in last 24 hours: Blood pressure (!) 146/87, pulse 65, temperature 97.7 F (36.5 C), temperature source Axillary, resp. rate 20, height 6' 1" (1.854 m), weight 64 kg, SpO2 99 %.  Intake/Output from previous day: 01/15 0701 - 01/16 0700 In: 1200 [P.O.:1200] Out: 1550 [Urine:1550]  Physical Exam:  HEENT: No thrush Lungs: Decreased breath sounds throughout the right chest, diminished breath sounds with scattered rales at the left chest, no respiratory distress at rest Cardiac: Irregular Abdomen: No hepatosplenomegaly Extremities: No leg edema  Lab Results: Recent Labs    08/29/21 1227 08/30/21 0451  WBC 16.0* 14.7*  HGB 10.1* 10.3*  HCT 34.1* 34.7*  PLT 332 329    BMET Recent Labs    08/29/21 1227 08/30/21 0451  NA 138 138  K 4.6 4.7  CL 103 103  CO2 30 29  GLUCOSE 145* 146*  BUN 40* 40*  CREATININE 1.32* 1.19  CALCIUM 9.2 9.3    No results found for: CEA1, CEA, CAN199, CA125  Studies/Results: No results found.  Medications: I have reviewed the patient's current medications.  Assessment/Plan:  Metastatic non-small cell lung cancer  large right pleural effusion, right lung mass, right pleural-based masses, left lung nodules Right thoracentesis 09/04/2018-negative cytology CT biopsy of right lateral pleural mass 09/07/2018- poorly differentiated non-small cell carcinoma, malignant cells positive for cytokeratin AE1/AE3 and cytokeratin 8/18, histology favoring adenocarcinoma, MSS, tumor mutation burden 5.no EGFR, ALK, BRAF alteration.  PDL 1 rearrangement PDL 1 tumor proportion score-100%  Cycle 1 Alimta/carboplatin 09/22/2018, pembrolizumab 09/28/2018 Cycle 2 Alimta/carboplatin/pembrolizumab 10/15/2018 Cycle 3 Alimta/carboplatin/pembrolizumab 11/06/2018 Cycle 4 Alimta/carboplatin/pembrolizumab 11/28/2018 CT chest 12/14/2018- response to therapy of lung  primary/primaries, thoracic nodal and pulmonary metastasis.  Decrease in right pleural effusion with improvement in pleural-based metastasis.  New T10 heterogeneous sclerotic density likely due to healing of previously CT occult metastasis. Pembrolizumab 12/19/2018 Pembrolizumab/Alimta starting 01/09/2019 Pembrolizumab/Alimta 01/31/2019 Pembrolizumab/Alimta 02/21/2019 Pembrolizumab/Alimta 03/14/2019 CT 04/01/2019- no evidence of disease progression, small loculated right pleural effusion and right basilar pleural nodularity-stable, stable left lower lobe nodule, persistent sclerosis at T10 Pembrolizumab/Alimta 04/05/2019 Pembrolizumab 05/07/2019 Pembrolizumab 05/28/2019 Pembrolizumab 06/18/2019 Pembrolizumab 07/09/2019 Chest CT 07/26/2019-moderate to large stable exudative right effusion with considerable pleural thickening.  Nodularity at the right lung base along pleural thickening similar to prior.  Stable left paramediastinal groundglass density nodule.  Stable sclerotic lesion posteriorly at the T10 vertebral level. Pembrolizumab 07/30/2019 Pembrolizumab 08/21/2019-changed to every 6-week dosing Pembrolizumab 10/11/2019 Pembrolizumab 11/22/2019 CT chest 01/03/2020-chronic appearing loculated effusion right chest with signs of pleural thickening smaller than on prior exam; similar appearance of tree-in-bud bud nodularity in the right upper lobe and left lung apex along with bronchiectatic changes in the lingula and middle lobe; similar appearance of sclerosis T10 vertebral body Pembrolizumab 01/03/2020 Pembrolizumab 02/14/2020 Pembrolizumab 03/27/2020 Chest CT 05/06/2020-unchanged posttreatment appearance of the chest with a chronic, loculated moderate right pleural effusion with associated atelectasis or consolidation and pleural thickening.  Stable small pulmonary nodules measuring 3 mm or smaller.  Pembrolizumab 05/08/2020 Pembrolizumab 06/19/2020 Pembrolizumab 08/03/2020 Pembrolizumab 09/14/2020 CT chest  11/04/2020-persistent and enlarging complex loculated right pleural fluid collection.  No findings suspicious for recurrent tumor, mediastinal/hilar adenopathy or pulmonary metastatic disease. CT chest 02/04/2021-chronic loculated right pleural effusion, new ill-defined right upper lobe nodule-nonspecific, coronary and aortic valve calcifications  (next scan planned at a 5-monthinterval) CT chest 05/21/2021-substantial increase peripheral airspace opacity in the right upper lobe and superior segment right  lower lobe.  Appearance suspicious for multi lobar pneumonia.  Stable thick-walled chronic large right pleural effusion.  New part solid 1.2 cm superior segment left lower lobe nodule.  Stable left upper lobe groundglass density pulmonary nodule.  Increased bandlike nodular opacity laterally in the left lower lobe. 10-day course of Levaquin   Chest x-ray 06/09/2021-new patchy airspace disease left upper lobe, right upper lobe airspace disease has minimally decreased but not resolved.  Stable loculated right pleural effusion Thoracentesis 07/06/2021-350 cc of thick bloody fluid removed.  Cytology with atypical cells present.  Culture with no growth. CT chest 07/26/2021-progressive ill-defined nodular airspace process in the left lower lobe with hazy surrounding groundglass opacity.  Significant progression of ill-defined groundglass opacity and slight interstitial thickening left upper lobe.  Improved appearance right upper lobe.  Stable thick-walled loculated right pleural fluid collection.  New small left pleural effusion.  Stable underlying lung disease.  No mediastinal or hilar mass or adenopathy. Pembrolizumab 07/30/2021 Pembrolizumab 08/20/2021   2.  Severe anemia-likely secondary to bleeding in the right pleural space and metastatic carcinoma, red cell transfusions 09/09/2018 09/10/2018, 09/23/2018-improved; progressive 04/26/2019, red cell transfusion.  Improved. 3.  Dyspnea secondary to the large right  pleural effusion and anemia Right Pleurx catheter placed 09/11/2018 Chest x-ray 10/09/2018- large right pleural effusion, loculated Chest x-ray 11/06/2018- no interval change in the right-sided pleural effusion Pleurx drainage catheter removed 11/13/2018 4.  History of tobacco use 5.  History of kidney stones 6.  Fever-most likely tumor fever, improved 7.  Admission 10/08/2018 with rapid atrial fibrillation/flutter- improved with diltiazem, started on Eliquis anticoagulation.  Eliquis discontinued.  Now on aspirin. 8.  Renal insufficiency 9.  Admission 08/26/2021 with increased dyspnea, CT with progressive left chest airspace disease   Mr. Pigeon appears stable.  Imaging findings performed on admission concerning for press of airspace disease primarily in his left lung.  The differential includes pneumonia versus tumor spread to the lungs versus pneumonitis from immunotherapy.  He has been started on steroids for possible pneumonitis.  Remains on antibiotics.  Scheduled for bronchoscopy today by Dr. Valeta Harms. We are concerned about his presentation being related to progression of his lung cancer.  If he is found to have progressive cancer, we will consider systemic therapy with Alimta and carboplatinum.  Recommendations: 1.  Continue antibiotics per primary team 2.  Steroids per pulmonology for possible pneumonitis 3.  Bronchoscopy today at [facility] per Dr. Valeta Harms.  We will follow-up once biopsy results are available.   LOS: 4 days   Mikey Bussing, NP   08/30/2021, 8:11 AM  Mr. Chaloux was interviewed and examined.  He reports improvement in dyspnea since hospital admission.  This may reflect improvement in lung inflammation from steroids.  He is scheduled undergo bronchoscopy today.  I remain concerned the progressive lung infiltrates are related to lung cancer with a differential diagnosis including infection and pneumonitis. I will follow-up on the bronchoscopy results and resume treatment  for lung cancer as indicated.  He can be discharged to home if he is ambulatory and has adequate oxygenation while on nasal cannula oxygen.  Outpatient follow-up will be scheduled at the Cancer center.  I was present greater than 50% of today's visit.  I performed medical decision making.

## 2021-08-30 NOTE — Anesthesia Postprocedure Evaluation (Signed)
Anesthesia Post Note  Patient: Brian Ramirez  Procedure(s) Performed: ROBOTIC ASSISTED NAVIGATIONAL BRONCHOSCOPY (Left) BRONCHIAL BIOPSIES BRONCHIAL NEEDLE ASPIRATION BIOPSIES BRONCHIAL WASHINGS     Patient location during evaluation: PACU Anesthesia Type: General Level of consciousness: awake and alert Pain management: pain level controlled Vital Signs Assessment: post-procedure vital signs reviewed and stable Respiratory status: spontaneous breathing, nonlabored ventilation, respiratory function stable and patient connected to nasal cannula oxygen Cardiovascular status: blood pressure returned to baseline and stable Postop Assessment: no apparent nausea or vomiting Anesthetic complications: no   No notable events documented.  Last Vitals:  Vitals:   08/30/21 1330 08/30/21 1428  BP: 139/81 (!) 166/82  Pulse: 63 (!) 57  Resp: 19 20  Temp:    SpO2: 99% 100%    Last Pain:  Vitals:   08/30/21 1300  TempSrc:   PainSc: 0-No pain                 Catalina Gravel

## 2021-08-30 NOTE — Interval H&P Note (Signed)
History and Physical Interval Note:  08/30/2021 11:39 AM  Brian Ramirez  has presented today for surgery, with the diagnosis of lung nodule: pleural effusion.  The various methods of treatment have been discussed with the patient and family. After consideration of risks, benefits and other options for treatment, the patient has consented to  Procedure(s) with comments: ROBOTIC ASSISTED NAVIGATIONAL BRONCHOSCOPY (Left) - ION w/ CIOS as a surgical intervention.  The patient's history has been reviewed, patient examined, no change in status, stable for surgery.  I have reviewed the patient's chart and labs.  Questions were answered to the patient's satisfaction.     [place]

## 2021-08-30 NOTE — Transfer of Care (Signed)
Immediate Anesthesia Transfer of Care Note  Patient: Brian Ramirez  Procedure(s) Performed: ROBOTIC ASSISTED NAVIGATIONAL BRONCHOSCOPY (Left) BRONCHIAL BIOPSIES BRONCHIAL NEEDLE ASPIRATION BIOPSIES BRONCHIAL WASHINGS  Patient Location: PACU  Anesthesia Type:General  Level of Consciousness: drowsy  Airway & Oxygen Therapy: Patient Spontanous Breathing and Patient connected to face mask oxygen  Post-op Assessment: Report given to RN and Post -op Vital signs reviewed and stable  Post vital signs: Reviewed and stable  Last Vitals:  Vitals Value Taken Time  BP 128/75 08/30/21 1233  Temp    Pulse 76 08/30/21 1234  Resp 29 08/30/21 1234  SpO2 96 % 08/30/21 1234  Vitals shown include unvalidated device data.  Last Pain:  Vitals:   08/30/21 0839  TempSrc:   PainSc: 0-No pain      Patients Stated Pain Goal: 2 (79/89/21 1941)  Complications: No notable events documented.

## 2021-08-30 NOTE — Progress Notes (Signed)
1358 patient transported back to [facility] via Biomedical scientist and transportation patient alert talkative skin warm and dry at departure

## 2021-08-30 NOTE — Anesthesia Procedure Notes (Signed)
Procedure Name: Intubation Date/Time: 08/30/2021 11:45 AM Performed by: Carolan Clines, CRNA Pre-anesthesia Checklist: Patient identified, Emergency Drugs available, Suction available and Patient being monitored Patient Re-evaluated:Patient Re-evaluated prior to induction Oxygen Delivery Method: Circle System Utilized Preoxygenation: Pre-oxygenation with 100% oxygen Induction Type: IV induction Ventilation: Mask ventilation without difficulty Laryngoscope Size: Glidescope and 4 Grade View: Grade I Tube type: Oral Tube size: 8.5 mm Number of attempts: 1 Airway Equipment and Method: Rigid stylet and Video-laryngoscopy Placement Confirmation: ETT inserted through vocal cords under direct vision, positive ETCO2 and breath sounds checked- equal and bilateral Tube secured with: Tape Dental Injury: Teeth and Oropharynx as per pre-operative assessment  Difficulty Due To: Difficulty was anticipated, Difficult Airway- due to limited oral opening and Difficult Airway- due to anterior larynx Future Recommendations: Recommend- induction with short-acting agent, and alternative techniques readily available Comments: Elective glidescope intubation d/t hx of previous difficult airway r/t small chin with limited oral opening. Easy mask ventilation. Grade I view with glide 4, atraumatic oral intubation.

## 2021-08-31 ENCOUNTER — Encounter (HOSPITAL_COMMUNITY): Payer: Self-pay

## 2021-08-31 DIAGNOSIS — J189 Pneumonia, unspecified organism: Secondary | ICD-10-CM | POA: Diagnosis not present

## 2021-08-31 LAB — CULTURE, BLOOD (ROUTINE X 2)
Culture: NO GROWTH
Culture: NO GROWTH
Special Requests: ADEQUATE
Special Requests: ADEQUATE

## 2021-08-31 LAB — CBC WITH DIFFERENTIAL/PLATELET
Abs Immature Granulocytes: 0.08 10*3/uL — ABNORMAL HIGH (ref 0.00–0.07)
Basophils Absolute: 0 10*3/uL (ref 0.0–0.1)
Basophils Relative: 0 %
Eosinophils Absolute: 0 10*3/uL (ref 0.0–0.5)
Eosinophils Relative: 0 %
HCT: 34.5 % — ABNORMAL LOW (ref 39.0–52.0)
Hemoglobin: 10.4 g/dL — ABNORMAL LOW (ref 13.0–17.0)
Immature Granulocytes: 1 %
Lymphocytes Relative: 3 %
Lymphs Abs: 0.4 10*3/uL — ABNORMAL LOW (ref 0.7–4.0)
MCH: 27.5 pg (ref 26.0–34.0)
MCHC: 30.1 g/dL (ref 30.0–36.0)
MCV: 91.3 fL (ref 80.0–100.0)
Monocytes Absolute: 0.8 10*3/uL (ref 0.1–1.0)
Monocytes Relative: 6 %
Neutro Abs: 12.2 10*3/uL — ABNORMAL HIGH (ref 1.7–7.7)
Neutrophils Relative %: 90 %
Platelets: 330 10*3/uL (ref 150–400)
RBC: 3.78 MIL/uL — ABNORMAL LOW (ref 4.22–5.81)
RDW: 14.3 % (ref 11.5–15.5)
WBC: 13.4 10*3/uL — ABNORMAL HIGH (ref 4.0–10.5)
nRBC: 0 % (ref 0.0–0.2)

## 2021-08-31 LAB — BASIC METABOLIC PANEL
Anion gap: 4 — ABNORMAL LOW (ref 5–15)
BUN: 42 mg/dL — ABNORMAL HIGH (ref 8–23)
CO2: 32 mmol/L (ref 22–32)
Calcium: 8.9 mg/dL (ref 8.9–10.3)
Chloride: 103 mmol/L (ref 98–111)
Creatinine, Ser: 1.23 mg/dL (ref 0.61–1.24)
GFR, Estimated: >60 mL/min (ref >60)
Glucose, Bld: 138 mg/dL — ABNORMAL HIGH (ref 70–99)
Potassium: 4.6 mmol/L (ref 3.5–5.1)
Sodium: 139 mmol/L (ref 135–145)

## 2021-08-31 LAB — EXPECTORATED SPUTUM ASSESSMENT W GRAM STAIN, RFLX TO RESP C

## 2021-08-31 LAB — CYTOLOGY - NON PAP

## 2021-08-31 LAB — LEGIONELLA PNEUMOPHILA SEROGP 1 UR AG: L. pneumophila Serogp 1 Ur Ag: NEGATIVE

## 2021-08-31 SURGERY — THORACENTESIS
Anesthesia: General | Laterality: Right

## 2021-08-31 MED ORDER — ENSURE ENLIVE PO LIQD: 237.0000 mL | Freq: Three times a day (TID) | ORAL | 12 refills | Status: AC

## 2021-08-31 MED ORDER — AMOXICILLIN-POT CLAVULANATE 875-125 MG PO TABS: 1.0000 | ORAL_TABLET | Freq: Two times a day (BID) | ORAL | 0 refills | Status: AC

## 2021-08-31 MED ORDER — PREDNISONE 20 MG PO TABS: ORAL_TABLET | ORAL | 0 refills | Status: AC

## 2021-08-31 MED ORDER — PREDNISONE 20 MG PO TABS: 40.0000 mg | ORAL_TABLET | Freq: Every day | ORAL | Status: DC

## 2021-08-31 NOTE — TOC Transition Note (Signed)
Transition of Care [facility]) - CM/SW Discharge Note   Patient Details  Name: Brian Ramirez MRN: [id number] Date of Birth: 1945/12/21  Transition of Care [facility]) CM/SW Contact:  Ross Ludwig, LCSW Phone Number: 08/31/2021, 4:16 PM   Clinical Narrative:     CSW spoke to patient again in regards to going home with home health.  Patient states he is not interested in home health at this time.  CSW discussed with him the advantages of it, and patient stated he is still not interested anymore.  CSW updated Amy at [place], [place] signing off, patient will be discharging back home.  Final next level of care: Home/Self Care Barriers to Discharge: Barriers Resolved   Patient Goals and CMS Choice Patient states their goals for this hospitalization and ongoing recovery are:: To return back home. CMS Medicare.gov Compare Post Acute Care list provided to:: Patient Choice offered to / list presented to : Patient  Discharge Placement                       Discharge Plan and Services                          HH Arranged: Patient Refused [place] Agency: [place] Date [facility] Agency Contacted: 08/28/21 Time HH Agency Contacted: 1400 Representative spoke with at [place]: Amy  Social Determinants of Health (DISH) Interventions     Readmission Risk Interventions No flowsheet data found.

## 2021-08-31 NOTE — Care Management Important Message (Signed)
Important Message  Patient Details IM Letter given to the Patient. Name: Brian Ramirez MRN: [id number] Date of Birth: [birth year]   Medicare Important Message Given:  Yes     Kerin Salen 08/31/2021, 10:27 AM

## 2021-08-31 NOTE — Progress Notes (Signed)
NAME:  Brian Ramirez, MRN:  [id number], DOB:  October 28, 1945, LOS: 5 ADMISSION DATE:  08/25/2021, CONSULTATION DATE:  1/13 REFERRING MD: Dr. Benny Lennert, CHIEF COMPLAINT: Weakness, Dyspnea    History of Present Illness:  76 y/o M who presented to [facility] on 1/11 with reports of weakness and shortness of breath.    He is followed by Dr. [name] for non-small cell lung cancer (originally diagnosed in 09/20/2018), most recently restarted on pembrolizumab in 07/2021 with last dose on 08/20/21. He was seen in the Oncology clinic with a CT of the chest that raised concern for progressive pulmonary changes and was referred to the Pulmonary Clinic. CT review showed progressive changes in sub-solid lesion of the LLL, & loculated right pleural effusion.  He was seen by Dr. Valeta Harms for consideration of bronchoscopy and management of presumed malignant effusion.  He has been arranged 1/17 for robotic assisted navigational bronchoscopy with tissue sampling and drainage of the right pleural effusion.  Unfortunately, he ended up admitted with increased symptom burden.    The patient lives alone, he reports SOB has been his limiting factor at home.  He reports he was performing activities reportedly normally up to October 2022.  He reports he last remembers going out to eat on 10/22 and felt good, was able to drive.  He began having shortness of breath after that limited his routine activities.  He notes he has been getting tired with eating. He denies fevers, chills, n/v/d, choking on foods but does feel as if he has something stuck in his throat at times.  No known sick contacts.   PCCM consulted for pulmonary evaluation.   Pertinent  Medical History  Non-small cell lung cancer - diagnosed 09/2018.   CAD  CHF  Atrial Flutter  Hiatal Hernia  Right sided pleural effusion - multiple prior thoracentesis, most recent with thick bloody pleural fluid removed, unable to drain more than 300 ml and no difference in  effusion on imaging  Difficult Airway for Intubation   Significant Hospital Events: Including procedures, antibiotic start and stop dates in addition to other pertinent events   1/11 Admit with SOB, weakness 1/13 PCCM consulted for evaluation of dyspnea  1/16 FOB with robotic assisted navigational bronchoscopy  Interim History / Subjective:  Pt reports feeling better on steroids, feels his endurance is some better Coughing up mild blood tinged sputum, overall improved  Afebrile   Objective   Blood pressure 139/70, pulse 70, temperature 98.1 F (36.7 C), temperature source Oral, resp. rate 18, height 6\' 1"  (1.854 m), weight 64.4 kg, SpO2 98 %.        Intake/Output Summary (Last 24 hours) at 08/31/2021 1506 Last data filed at 08/31/2021 0910 Gross per 24 hour  Intake 360 ml  Output 590 ml  Net -230 ml   Filed Weights   08/25/21 2306 08/30/21 0823  Weight: 64 kg 64.4 kg    Examination: General:  thin adult male sitting up in bed in NAD  HEENT: MM pink/moist, anicteric, good dentition  Neuro: AAOx4,  CV: s1s2 RRR, no m/r/g PULM: non-labored at rest, lungs bilaterally clear GI: soft, bsx4 active  Extremities: warm/dry, no edema  Skin: no rashes or lesions   Resolved Hospital Problem list     Assessment & Plan:   Recurrent Right Pleural Effusion  LLL Pulmonary Nodule / Nodular Opacity  NSCLC of Right Lung  Dyspnea  Chronic Hypoxic Respiratory Failure  Moderate Protein Calorie Malnutrition  Discussion: 76 y/o M  with chronic hypoxic respiratory failure, and advanced stage non-small cell lung cancer admitted with increasing dyspnea & weakness.  Recent CT imaging of the chest shows concern for possible new malignancy in the LLL with an ill defined nodular opacity. He underwent robotic assisted navigational bronchoscopy with tissue sampling.  Unfortunately, he ended up admitted with increased symptom burden.  He has had his pleural effusion tapped in the past with limited  return.  Most recently, IR was able to get ~ 300 ml of thick bloody pleural fluid out but no change in imaging after procedure.  -discharge plan - 3L O2 continuously  -transition to prednisone orally, consider 60 mg QD with taper by 10mg  per week until pulmonary follow up  -complete 5-7 days abx  -follow up in pulmonary clinic 2/1 at 2pm  -continue to work on nutritional status, ensure TID  -would hold on further attempts at right pleural space drainage  Best Practice (right click and "Reselect all SmartList Selections" daily)  Per primary / TRH       Noe Gens, MSN, APRN, NP-C, AGACNP-BC [facility] 08/31/2021, 3:06 PM   Please see [email or web address] for pager details.   From 7A-7P if no response, please call [phone] After hours, please call ELink [phone]

## 2021-08-31 NOTE — Discharge Planning (Signed)
Oncology Discharge Planning Note  [facility] Address: [address] [place], [place], [place] [address] Hours of Operation:  Nena Polio, Monday - Friday  Clinic Contact Information:  [phone]) [phone]  Oncology Care Team: Medical Oncologist:  Dr. Betsy Coder  Patient Details: Name:  Brian Ramirez, Brian Ramirez MRN:   [id number] DOB:   [birth year] Reason for Current Admission: Pneumonia of both lungs due to infectious organism  Discharge Planning Narrative: Notification of admission received by service for Brian Ramirez.  Discharge follow-up appointments for oncology are current and available on the AVS and MyChart.   Upon discharge from the hospital, hematology/oncology's post discharge plan of care for the outpatient setting is: Return to office on 09/03/21 at 11:00 to go over pathology report and treatment planning.   Brian Ramirez will be called within two business days after discharge to review hematology/oncology's plan of care for full understanding.    Outpatient Oncology Specific Care Only: Oncology appointment transportation needs addressed?:  no Oncology medication management for symptom management addressed?:  no Chemo Alert Card reviewed?:  no Immunotherapy Alert Card reviewed?:  not applicable Will review all above at post d/c call

## 2021-08-31 NOTE — Progress Notes (Signed)
Scheduling message sent to [facility] to arrange for outpatient follow-up this Friday to review biopsy result.  The patient will be contacted with the date/time of his appointment.  Mikey Bussing, DNP, AGPCNP-BC, AOCNP

## 2021-09-01 ENCOUNTER — Encounter (HOSPITAL_COMMUNITY): Payer: Self-pay | Admitting: Pulmonary Disease

## 2021-09-01 LAB — ACID FAST SMEAR (AFB, MYCOBACTERIA)
Acid Fast Smear: NEGATIVE
Acid Fast Smear: NEGATIVE

## 2021-09-02 ENCOUNTER — Telehealth: Payer: Self-pay | Admitting: *Deleted

## 2021-09-02 LAB — CULTURE, RESPIRATORY W GRAM STAIN
Culture: NORMAL
Gram Stain: NONE SEEN

## 2021-09-02 LAB — QUANTIFERON-TB GOLD PLUS: QuantiFERON-TB Gold Plus: UNDETERMINED — AB

## 2021-09-02 LAB — QUANTIFERON-TB GOLD PLUS (RQFGPL)
QuantiFERON Mitogen Value: 0.05 IU/mL
QuantiFERON Nil Value: 0.01 IU/mL
QuantiFERON TB1 Ag Value: 0 IU/mL
QuantiFERON TB2 Ag Value: 0.02 IU/mL

## 2021-09-02 NOTE — Telephone Encounter (Signed)
Ndrew Creason was contacted by telephone to verify understanding of discharge instructions status post their most recent discharge from the hospital on the date:  08/30/21.   Unable to reach patient . Left voice mail for him with appointment tomorrow at 11:00. Requested call to office if he has any difficulty getting here.

## 2021-09-03 ENCOUNTER — Inpatient Hospital Stay (HOSPITAL_BASED_OUTPATIENT_CLINIC_OR_DEPARTMENT_OTHER): Payer: Medicare Other | Admitting: Oncology

## 2021-09-03 ENCOUNTER — Other Ambulatory Visit: Payer: Self-pay

## 2021-09-03 VITALS — BP 107/66 | HR 67 | Temp 97.8°F | Resp 20 | Ht 73.0 in | Wt 145.0 lb

## 2021-09-03 DIAGNOSIS — C771 Secondary and unspecified malignant neoplasm of intrathoracic lymph nodes: Secondary | ICD-10-CM | POA: Diagnosis not present

## 2021-09-03 DIAGNOSIS — C3490 Malignant neoplasm of unspecified part of unspecified bronchus or lung: Secondary | ICD-10-CM

## 2021-09-03 DIAGNOSIS — Z79899 Other long term (current) drug therapy: Secondary | ICD-10-CM | POA: Diagnosis not present

## 2021-09-03 DIAGNOSIS — J910 Malignant pleural effusion: Secondary | ICD-10-CM | POA: Diagnosis not present

## 2021-09-03 DIAGNOSIS — C3491 Malignant neoplasm of unspecified part of right bronchus or lung: Secondary | ICD-10-CM | POA: Diagnosis present

## 2021-09-03 DIAGNOSIS — Z5112 Encounter for antineoplastic immunotherapy: Secondary | ICD-10-CM | POA: Diagnosis not present

## 2021-09-03 NOTE — Progress Notes (Signed)
[facility] OFFICE PROGRESS NOTE   Diagnosis: Non-small cell lung cancer  INTERVAL HISTORY:   Brian Ramirez was discharged from the hospital 08/31/2021 after admission with respiratory failure.  He was noted to have bilateral infiltrates on admission imaging.  He was placed on steroids.  He had marked improvement in dyspnea during the hospital admission.  He continues to feel better. He underwent a bronchoscopy 08/30/2021.  Biopsies were obtained from the left upper lobe in addition to bronchoalveolar lavage.  He is completing a steroid taper and course of Augmentin.  He continues oxygen via nasal cannula at 3 L/min.  Objective:  Vital signs in last 24 hours:  Blood pressure 107/66, pulse 67, temperature 97.8 F (36.6 C), temperature source Oral, resp. rate 20, height _0  (1.854 m), weight 145 lb (65.8 kg), SpO2 96 %.    HEENT: No thrush Resp: Decreased breath sounds at the right posterior chest, inspiratory rhonchi at the left upper anterior chest, no respiratory distress Cardio: Irregular GI: No hepatosplenomegaly Vascular: No leg edema   Lab Results:  Lab Results  Component Value Date   WBC 13.4 (H) 08/31/2021   HGB 10.4 (L) 08/31/2021   HCT 34.5 (L) 08/31/2021   MCV 91.3 08/31/2021   PLT 330 08/31/2021   NEUTROABS 12.2 (H) 08/31/2021    CMP  Lab Results  Component Value Date   NA 139 08/31/2021   K 4.6 08/31/2021   CL 103 08/31/2021   CO2 32 08/31/2021   GLUCOSE 138 (H) 08/31/2021   BUN 42 (H) 08/31/2021   CREATININE 1.23 08/31/2021   CALCIUM 8.9 08/31/2021   PROT 6.7 08/30/2021   ALBUMIN 2.5 (L) 08/30/2021   AST 23 08/30/2021   ALT 25 08/30/2021   ALKPHOS 73 08/30/2021   BILITOT 0.4 08/30/2021   GFRNONAA >60 08/31/2021   GFRAA 46 (L) 05/08/2020    No results found for: CEA1, CEA, CAN199, CA125  Lab Results  Component Value Date   INR 1.0 08/30/2021   LABPROT 13.2 08/30/2021    Imaging:  DG Chest Port 1 View  Result Date:  08/30/2021 CLINICAL DATA:  Status post bronchoscopy. EXAM: PORTABLE CHEST 1 VIEW COMPARISON:  Chest radiograph 07/06/2021 and CT 08/26/2021 FINDINGS: The cardiomediastinal silhouette is unchanged with normal heart size. A moderate-sized, loculated right pleural effusion and small left pleural effusion are again noted. Left upper lobe airspace consolidation is new/progressive compared to the 07/06/2021 radiograph but was present on the recent CT. Patchy airspace opacities are again noted in the left lower lobe and right upper lobe as well. There is compressive atelectasis in the right lower lobe. No pneumothorax is identified. IMPRESSION: Similar appearance of the chest compared to the recent CT including right larger than left pleural effusions and bilateral airspace disease. No pneumothorax. Electronically Signed   By: Logan Bores M.D.   On: 08/30/2021 13:00   DG C-ARM BRONCHOSCOPY  Result Date: 08/30/2021 C-ARM BRONCHOSCOPY: Fluoroscopy was utilized by the requesting physician.  No radiographic interpretation.    Medications: I have reviewed the patient's current medications.   Assessment/Plan: Metastatic non-small cell lung cancer  large right pleural effusion, right lung mass, right pleural-based masses, left lung nodules Right thoracentesis 09/04/2018-negative cytology CT biopsy of right lateral pleural mass 09/07/2018- poorly differentiated non-small cell carcinoma, malignant cells positive for cytokeratin AE1/AE3 and cytokeratin 8/18, histology favoring adenocarcinoma, MSS, tumor mutation burden 5.no EGFR, ALK, BRAF alteration.  PDL 1 rearrangement PDL 1 tumor proportion score-100%  Cycle 1 Alimta/carboplatin 09/22/2018,  pembrolizumab 09/28/2018 Cycle 2 Alimta/carboplatin/pembrolizumab 10/15/2018 Cycle 3 Alimta/carboplatin/pembrolizumab 11/06/2018 Cycle 4 Alimta/carboplatin/pembrolizumab 11/28/2018 CT chest 12/14/2018- response to therapy of lung primary/primaries, thoracic nodal and pulmonary  metastasis.  Decrease in right pleural effusion with improvement in pleural-based metastasis.  New T10 heterogeneous sclerotic density likely due to healing of previously CT occult metastasis. Pembrolizumab 12/19/2018 Pembrolizumab/Alimta starting 01/09/2019 Pembrolizumab/Alimta 01/31/2019 Pembrolizumab/Alimta 02/21/2019 Pembrolizumab/Alimta 03/14/2019 CT 04/01/2019- no evidence of disease progression, small loculated right pleural effusion and right basilar pleural nodularity-stable, stable left lower lobe nodule, persistent sclerosis at T10 Pembrolizumab/Alimta 04/05/2019 Pembrolizumab 05/07/2019 Pembrolizumab 05/28/2019 Pembrolizumab 06/18/2019 Pembrolizumab 07/09/2019 Chest CT 07/26/2019-moderate to large stable exudative right effusion with considerable pleural thickening.  Nodularity at the right lung base along pleural thickening similar to prior.  Stable left paramediastinal groundglass density nodule.  Stable sclerotic lesion posteriorly at the T10 vertebral level. Pembrolizumab 07/30/2019 Pembrolizumab 08/21/2019-changed to every 6-week dosing Pembrolizumab 10/11/2019 Pembrolizumab 11/22/2019 CT chest 01/03/2020-chronic appearing loculated effusion right chest with signs of pleural thickening smaller than on prior exam; similar appearance of tree-in-bud bud nodularity in the right upper lobe and left lung apex along with bronchiectatic changes in the lingula and middle lobe; similar appearance of sclerosis T10 vertebral body Pembrolizumab 01/03/2020 Pembrolizumab 02/14/2020 Pembrolizumab 03/27/2020 Chest CT 05/06/2020-unchanged posttreatment appearance of the chest with a chronic, loculated moderate right pleural effusion with associated atelectasis or consolidation and pleural thickening.  Stable small pulmonary nodules measuring 3 mm or smaller.  Pembrolizumab 05/08/2020 Pembrolizumab 06/19/2020 Pembrolizumab 08/03/2020 Pembrolizumab 09/14/2020 CT chest 11/04/2020-persistent and enlarging complex  loculated right pleural fluid collection.  No findings suspicious for recurrent tumor, mediastinal/hilar adenopathy or pulmonary metastatic disease. CT chest 02/04/2021-chronic loculated right pleural effusion, new ill-defined right upper lobe nodule-nonspecific, coronary and aortic valve calcifications  (next scan planned at a 76-monthinterval) CT chest 05/21/2021-substantial increase peripheral airspace opacity in the right upper lobe and superior segment right lower lobe.  Appearance suspicious for multi lobar pneumonia.  Stable thick-walled chronic large right pleural effusion.  New part solid 1.2 cm superior segment left lower lobe nodule.  Stable left upper lobe groundglass density pulmonary nodule.  Increased bandlike nodular opacity laterally in the left lower lobe. 10-day course of Levaquin   Chest x-ray 06/09/2021-new patchy airspace disease left upper lobe, right upper lobe airspace disease has minimally decreased but not resolved.  Stable loculated right pleural effusion Thoracentesis 07/06/2021-350 cc of thick bloody fluid removed.  Cytology with atypical cells present.  Culture with no growth. CT chest 07/26/2021-progressive ill-defined nodular airspace process in the left lower lobe with hazy surrounding groundglass opacity.  Significant progression of ill-defined groundglass opacity and slight interstitial thickening left upper lobe.  Improved appearance right upper lobe.  Stable thick-walled loculated right pleural fluid collection.  New small left pleural effusion.  Stable underlying lung disease.  No mediastinal or hilar mass or adenopathy. Pembrolizumab 07/30/2021 Pembrolizumab 08/20/2021 CT chest 08/26/2021-new left upper lobe airspace disease with interlobar septal thickening, new airspace disease in the superior segment of the left lower lobe, minimal patchy groundglass opacities in the right upper lobe, increased left pleural effusion, new prominent prevascular node Antibiotics and  steroid taper with clinical improvement Bronchoscopy with biopsies and bronchoalveolar lavage from the left upper lobe 08/30/2021-culture negative, transbronchial biopsy and brushing negative for malignancy   2.  Severe anemia-likely secondary to bleeding in the right pleural space and metastatic carcinoma, red cell transfusions 09/09/2018 09/10/2018, 09/23/2018-improved; progressive 04/26/2019, red cell transfusion.  Improved. 3.  Dyspnea secondary to the large right pleural effusion  and anemia Right Pleurx catheter placed 09/11/2018 Chest x-ray 10/09/2018- large right pleural effusion, loculated Chest x-ray 11/06/2018- no interval change in the right-sided pleural effusion Pleurx drainage catheter removed 11/13/2018 4.  History of tobacco use 5.  History of kidney stones 6.  Fever-most likely tumor fever, improved 7.  Admission 10/08/2018 with rapid atrial fibrillation/flutter- improved with diltiazem, started on Eliquis anticoagulation.  Eliquis discontinued.  Now on aspirin. 8.  Renal insufficiency 9.  Admission 08/26/2021 with increased dyspnea, CT with progressive left chest airspace disease, clinical improvement following antibiotics and steroids   Disposition: Brian Ramirez has experienced significant clinical improvement over the past week with antibiotics and steroids.  He will continue the steroid taper as recommended by pulmonary medicine.  He is completing an outpatient course of Augmentin.  It is unclear whether the clinical presentation last week was related to pneumonia or pneumonitis.  Pembrolizumab will be placed on hold.  It is possible he has pembrolizumab induced pneumonitis, but the repeat course of pembrolizumab was started due to progressive parenchymal densities in the left lung and worsening of dyspnea.  He is scheduled to follow-up with pulmonary medicine on 09/15/2021.  He will return for an office visit during the week of 09/20/2021.  Betsy Coder, MD  09/03/2021  11:56  AM

## 2021-09-04 LAB — AEROBIC/ANAEROBIC CULTURE W GRAM STAIN (SURGICAL/DEEP WOUND)
Culture: NO GROWTH
Culture: NO GROWTH
Gram Stain: NONE SEEN
Gram Stain: NONE SEEN

## 2021-09-07 ENCOUNTER — Other Ambulatory Visit: Payer: Self-pay

## 2021-09-13 ENCOUNTER — Inpatient Hospital Stay: Payer: Medicare Other | Admitting: Nurse Practitioner

## 2021-09-13 ENCOUNTER — Inpatient Hospital Stay: Payer: Medicare Other

## 2021-09-15 ENCOUNTER — Other Ambulatory Visit: Payer: Self-pay

## 2021-09-15 ENCOUNTER — Encounter: Payer: Self-pay | Admitting: Acute Care

## 2021-09-15 ENCOUNTER — Ambulatory Visit (INDEPENDENT_AMBULATORY_CARE_PROVIDER_SITE_OTHER): Payer: Medicare Other | Admitting: Acute Care

## 2021-09-15 ENCOUNTER — Ambulatory Visit (INDEPENDENT_AMBULATORY_CARE_PROVIDER_SITE_OTHER): Payer: Medicare Other

## 2021-09-15 VITALS — BP 130/70 | HR 95 | Temp 97.8°F | Ht 72.0 in | Wt 141.4 lb

## 2021-09-15 DIAGNOSIS — R0600 Dyspnea, unspecified: Secondary | ICD-10-CM | POA: Diagnosis not present

## 2021-09-15 DIAGNOSIS — E440 Moderate protein-calorie malnutrition: Secondary | ICD-10-CM | POA: Diagnosis not present

## 2021-09-15 DIAGNOSIS — C3491 Malignant neoplasm of unspecified part of right bronchus or lung: Secondary | ICD-10-CM

## 2021-09-15 DIAGNOSIS — J189 Pneumonia, unspecified organism: Secondary | ICD-10-CM

## 2021-09-15 DIAGNOSIS — Z9889 Other specified postprocedural states: Secondary | ICD-10-CM

## 2021-09-15 DIAGNOSIS — J90 Pleural effusion, not elsewhere classified: Secondary | ICD-10-CM | POA: Diagnosis not present

## 2021-09-15 DIAGNOSIS — R5381 Other malaise: Secondary | ICD-10-CM | POA: Diagnosis not present

## 2021-09-15 DIAGNOSIS — J9611 Chronic respiratory failure with hypoxia: Secondary | ICD-10-CM | POA: Diagnosis not present

## 2021-09-15 MED ORDER — ALBUTEROL SULFATE HFA 108 (90 BASE) MCG/ACT IN AERS: 2.0000 | INHALATION_SPRAY | Freq: Four times a day (QID) | RESPIRATORY_TRACT | 2 refills | Status: AC | PRN

## 2021-09-15 MED ORDER — STIOLTO RESPIMAT 2.5-2.5 MCG/ACT IN AERS: 2.0000 | INHALATION_SPRAY | Freq: Every day | RESPIRATORY_TRACT | 0 refills | Status: DC

## 2021-09-15 MED ORDER — ALBUTEROL SULFATE HFA 108 (90 BASE) MCG/ACT IN AERS: 2.0000 | INHALATION_SPRAY | Freq: Four times a day (QID) | RESPIRATORY_TRACT | 2 refills | Status: DC | PRN

## 2021-09-15 NOTE — Addendum Note (Signed)
Addended by: Vanessa Barbara on: 09/15/2021 03:26 PM   Modules accepted: Orders

## 2021-09-15 NOTE — Addendum Note (Signed)
Addended by: Vanessa Barbara on: 09/15/2021 05:05 PM   Modules accepted: Orders
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
## 2021-09-15 NOTE — Patient Instructions (Addendum)
It is good to see you today. Biopsies were negative for cancer.  This is great news.  We will do a CXR today to see if your pneumonia has improved. We will call you with results.  Complete Prednisone prescribed in the hospital.  We will refer to Pulmonary Rehab. ( Under Dr. Valeta Harms for acute on chronic respiratory failure)  We will give you a Stialto sample as a therapeutic trial to see if you feel benefit after use.  Use 2 puffs once daily. Rinse mouth after use.  PFT's in 1 month after the patient has recovered from his current hospitalization.  Continue using Ensure and eating frequent meals.  If you continue to lose weight we will consult a dietitian.  Follow up in 2-3 weeks after trying Stialto.  We will send in a prescription for Albuterol  Use for breakthrough shortness of breath or wheezing up to 2 times a day.  If you need this more than twice a day, call to be seen.  Continue oxygen at 3 L [place], goal oxygen saturations are > 92% Please contact office for sooner follow up if symptoms do not improve or worsen or seek emergency care

## 2021-09-16 ENCOUNTER — Telehealth: Payer: Self-pay | Admitting: Acute Care

## 2021-09-16 NOTE — Telephone Encounter (Signed)
Please call patient and let him know his CXR shows the chronic issues we know he has, and some signs of pneumonitis, which is non-infectious lung inflammation. We will just continue to watch this. He does have follow up 10/18/2021 with Dr. Valeta Harms . Have him call if anything changes. Thanks so much

## 2021-09-16 NOTE — Telephone Encounter (Signed)
I called the patient and he voices understanding. Nothing further needed.

## 2021-09-23 ENCOUNTER — Inpatient Hospital Stay: Payer: Medicare Other | Attending: Nurse Practitioner | Admitting: Nurse Practitioner

## 2021-09-23 ENCOUNTER — Other Ambulatory Visit: Payer: Self-pay

## 2021-09-23 ENCOUNTER — Encounter: Payer: Self-pay | Admitting: Nurse Practitioner

## 2021-09-23 VITALS — BP 127/78 | HR 105 | Temp 98.0°F | Resp 18 | Ht 72.0 in | Wt 140.6 lb

## 2021-09-23 DIAGNOSIS — C782 Secondary malignant neoplasm of pleura: Secondary | ICD-10-CM | POA: Diagnosis not present

## 2021-09-23 DIAGNOSIS — C3490 Malignant neoplasm of unspecified part of unspecified bronchus or lung: Secondary | ICD-10-CM | POA: Diagnosis not present

## 2021-09-23 DIAGNOSIS — J910 Malignant pleural effusion: Secondary | ICD-10-CM | POA: Diagnosis not present

## 2021-09-23 DIAGNOSIS — D649 Anemia, unspecified: Secondary | ICD-10-CM | POA: Diagnosis not present

## 2021-09-23 DIAGNOSIS — C771 Secondary and unspecified malignant neoplasm of intrathoracic lymph nodes: Secondary | ICD-10-CM | POA: Insufficient documentation

## 2021-09-23 DIAGNOSIS — C3491 Malignant neoplasm of unspecified part of right bronchus or lung: Secondary | ICD-10-CM | POA: Insufficient documentation

## 2021-09-23 MED ORDER — PREDNISONE 20 MG PO TABS: ORAL_TABLET | ORAL | 1 refills | Status: DC

## 2021-09-23 NOTE — Progress Notes (Signed)
[place] OFFICE PROGRESS NOTE   Diagnosis: Non-small cell lung cancer  INTERVAL HISTORY:   Brian Ramirez returns as scheduled.  He had a chest x-ray 09/15/2021-interval improvement in left upper lobe airspace disease, persistent bilateral pleural effusions right greater than left, persistent bilateral upper lobe reticular interstitial opacities.  He thinks he completed the prednisone taper around 09/17/2021.  Since then he has noted more shortness of breath.  He has a mild cough.  No fever.  He developed back pain when he was loading the dishwasher recently.  Objective:  Vital signs in last 24 hours:  Blood pressure 127/78, pulse (!) 105, temperature 98 F (36.7 C), resp. rate 18, height 6' (1.829 m), weight 140 lb 9.6 oz (63.8 kg), SpO2 98 %.    Resp: Diminished breath sounds right lung field.  No respiratory distress. Cardio: Irregular. GI: Abdomen soft and nontender.  No hepatomegaly. Vascular: No leg edema.   Lab Results:  Lab Results  Component Value Date   WBC 13.4 (H) 08/31/2021   HGB 10.4 (L) 08/31/2021   HCT 34.5 (L) 08/31/2021   MCV 91.3 08/31/2021   PLT 330 08/31/2021   NEUTROABS 12.2 (H) 08/31/2021    Imaging:  No results found.  Medications: I have reviewed the patient's current medications.  Assessment/Plan: Metastatic non-small cell lung cancer  large right pleural effusion, right lung mass, right pleural-based masses, left lung nodules Right thoracentesis 09/04/2018-negative cytology CT biopsy of right lateral pleural mass 09/07/2018- poorly differentiated non-small cell carcinoma, malignant cells positive for cytokeratin AE1/AE3 and cytokeratin 8/18, histology favoring adenocarcinoma, MSS, tumor mutation burden 5.no EGFR, ALK, BRAF alteration.  PDL 1 rearrangement PDL 1 tumor proportion score-100%  Cycle 1 Alimta/carboplatin 09/22/2018, pembrolizumab 09/28/2018 Cycle 2 Alimta/carboplatin/pembrolizumab 10/15/2018 Cycle 3  Alimta/carboplatin/pembrolizumab 11/06/2018 Cycle 4 Alimta/carboplatin/pembrolizumab 11/28/2018 CT chest 12/14/2018- response to therapy of lung primary/primaries, thoracic nodal and pulmonary metastasis.  Decrease in right pleural effusion with improvement in pleural-based metastasis.  New T10 heterogeneous sclerotic density likely due to healing of previously CT occult metastasis. Pembrolizumab 12/19/2018 Pembrolizumab/Alimta starting 01/09/2019 Pembrolizumab/Alimta 01/31/2019 Pembrolizumab/Alimta 02/21/2019 Pembrolizumab/Alimta 03/14/2019 CT 04/01/2019- no evidence of disease progression, small loculated right pleural effusion and right basilar pleural nodularity-stable, stable left lower lobe nodule, persistent sclerosis at T10 Pembrolizumab/Alimta 04/05/2019 Pembrolizumab 05/07/2019 Pembrolizumab 05/28/2019 Pembrolizumab 06/18/2019 Pembrolizumab 07/09/2019 Chest CT 07/26/2019-moderate to large stable exudative right effusion with considerable pleural thickening.  Nodularity at the right lung base along pleural thickening similar to prior.  Stable left paramediastinal groundglass density nodule.  Stable sclerotic lesion posteriorly at the T10 vertebral level. Pembrolizumab 07/30/2019 Pembrolizumab 08/21/2019-changed to every 6-week dosing Pembrolizumab 10/11/2019 Pembrolizumab 11/22/2019 CT chest 01/03/2020-chronic appearing loculated effusion right chest with signs of pleural thickening smaller than on prior exam; similar appearance of tree-in-bud bud nodularity in the right upper lobe and left lung apex along with bronchiectatic changes in the lingula and middle lobe; similar appearance of sclerosis T10 vertebral body Pembrolizumab 01/03/2020 Pembrolizumab 02/14/2020 Pembrolizumab 03/27/2020 Chest CT 05/06/2020-unchanged posttreatment appearance of the chest with a chronic, loculated moderate right pleural effusion with associated atelectasis or consolidation and pleural thickening.  Stable small pulmonary  nodules measuring 3 mm or smaller.  Pembrolizumab 05/08/2020 Pembrolizumab 06/19/2020 Pembrolizumab 08/03/2020 Pembrolizumab 09/14/2020 CT chest 11/04/2020-persistent and enlarging complex loculated right pleural fluid collection.  No findings suspicious for recurrent tumor, mediastinal/hilar adenopathy or pulmonary metastatic disease. CT chest 02/04/2021-chronic loculated right pleural effusion, new ill-defined right upper lobe nodule-nonspecific, coronary and aortic valve calcifications  (next scan planned at a  30-monthinterval) CT chest 05/21/2021-substantial increase peripheral airspace opacity in the right upper lobe and superior segment right lower lobe.  Appearance suspicious for multi lobar pneumonia.  Stable thick-walled chronic large right pleural effusion.  New part solid 1.2 cm superior segment left lower lobe nodule.  Stable left upper lobe groundglass density pulmonary nodule.  Increased bandlike nodular opacity laterally in the left lower lobe. 10-day course of Levaquin   Chest x-ray 06/09/2021-new patchy airspace disease left upper lobe, right upper lobe airspace disease has minimally decreased but not resolved.  Stable loculated right pleural effusion Thoracentesis 07/06/2021-350 cc of thick bloody fluid removed.  Cytology with atypical cells present.  Culture with no growth. CT chest 07/26/2021-progressive ill-defined nodular airspace process in the left lower lobe with hazy surrounding groundglass opacity.  Significant progression of ill-defined groundglass opacity and slight interstitial thickening left upper lobe.  Improved appearance right upper lobe.  Stable thick-walled loculated right pleural fluid collection.  New small left pleural effusion.  Stable underlying lung disease.  No mediastinal or hilar mass or adenopathy. Pembrolizumab 07/30/2021 Pembrolizumab 08/20/2021 CT chest 08/26/2021-new left upper lobe airspace disease with interlobar septal thickening, new airspace disease in the  superior segment of the left lower lobe, minimal patchy groundglass opacities in the right upper lobe, increased left pleural effusion, new prominent prevascular node Antibiotics and steroid taper with clinical improvement Bronchoscopy with biopsies and bronchoalveolar lavage from the left upper lobe 08/30/2021-culture negative, transbronchial biopsy and brushing negative for malignancy   2.  Severe anemia-likely secondary to bleeding in the right pleural space and metastatic carcinoma, red cell transfusions 09/09/2018 09/10/2018, 09/23/2018-improved; progressive 04/26/2019, red cell transfusion.  Improved. 3.  Dyspnea secondary to the large right pleural effusion and anemia Right Pleurx catheter placed 09/11/2018 Chest x-ray 10/09/2018- large right pleural effusion, loculated Chest x-ray 11/06/2018- no interval change in the right-sided pleural effusion Pleurx drainage catheter removed 11/13/2018 4.  History of tobacco use 5.  History of kidney stones 6.  Fever-most likely tumor fever, improved 7.  Admission 10/08/2018 with rapid atrial fibrillation/flutter- improved with diltiazem, started on Eliquis anticoagulation.  Eliquis discontinued.  Now on aspirin. 8.  Renal insufficiency 9.  Admission 08/26/2021 with increased dyspnea, CT with progressive left chest airspace disease, clinical improvement following antibiotics and steroids      Disposition: Mr. KFleigappears stable.  He improved significantly with antibiotics and steroids.  He completed the steroid taper about 6 days ago.  Since then he has noted increased shortness of breath.  He will resume prednisone 60 mg daily for 7 days, 40 mg daily for 7 days.  He will return for follow-up on 10/06/2021.  We are available to see him sooner if needed.  Patient seen with Dr. SBenay Spice    LTheoren PalkaANP/GNP-BC   09/23/2021  2:13 PM  This was a shared visit with LNed Card  Mr. KPiankawas interviewed and examined.  He continues to have significant  dyspnea following the prednisone taper.  The etiology of the dyspnea and parenchymal lung densities remains unclear.  Cultures and biopsies were negative for infection and malignancy during the recent hospital admission.  It is possible he has pneumonitis, though he developed increased respiratory distress prior to resuming pembrolizumab in December.  He will resume a slow steroid taper.  He will follow-up with Dr. IValeta Harms  I was present for greater than 50% of today's visit.  I performed my decision-making.  BJulieanne Manson MD

## 2021-09-28 LAB — FUNGUS CULTURE WITH STAIN

## 2021-09-28 LAB — FUNGAL ORGANISM REFLEX

## 2021-09-28 LAB — FUNGUS CULTURE RESULT

## 2021-10-01 NOTE — Discharge Summary (Signed)
Physician Discharge Summary   Patient: Brian Ramirez MRN: [id number] DOB: 19-Oct-1945  Admit date:     08/25/2021  Discharge date: 08/31/2021  Discharge Physician: Karie Kirks   PCP: [name], MD   Recommendations at discharge:    Discharge to home. Complete antibiotics. Follow up with oncology as directed.  Discharge Diagnoses: Principal Problem:   Pneumonia of both lungs due to infectious organism Active Problems:   Mass of right lung   Pleural effusion on right   Dyspnea   Pneumonia   Acute respiratory failure with hypoxia ([place])   Pulmonary infiltrates  Resolved Problems:   * No resolved hospital problems. [facility] Course: The patient is a 76 yr old man who carries a diagnosis for Non-small cell lung CA. He has been undergoing treatment with Dr. [name] since his diagnosis in 08/2018. He had Alimta/carboplatin/pembrolizumab . He continued pembrolizumab for the next two years. This therapy was resumed in 07/2021. His last treatment was 08/20/2021.    The patient presented to his visit with complaints of progressive dyspnea and weakness over the past week. He presented to [facility] ED on the evening of 08/25/2021 and was following up with Dr. [name] today. CT chest of 07/26/2021 demonstrated a progressive ill-defined nodular airspace process in the left lower lobe with hazy groundglass opacity. There was also slight interstitial thickening of the left upper lobe. There was improved appearance of the rightr upper loabe. There was a stable thick-walled loculated right pleural fluid collection. There is a new small left pleural effusion.    Medical history is significant for anemia of chronic disease, Chronic CHF with Grade 1 diastolic dysfunction and EF of 50-55% as of echocardiogram of 03/11/2019, Atrial fibrillation/flutter, no anticoagulation, anemia of chronic diseaes, CAD of native artery, essential hypertension, non-small cell lung CA, Moderate protein calorie malnutrition,  chronic pleural effusion.  Malignant pleural effusion: The patient has undergone thoracentesisi with IR. Only 300cc was obtained.   [facility] have been consulted to admit the patient for further evaluation and treatment.    The patient has been admitted to a telemetry bed. He will be treated with IV cefepime and vancomycin. Sputum cultures and procalcitonin will be obtained.    Pulmonology has been consulted. They have started steroids for possible pneumonitis.    Palliative care was consulted.  The patient was discharged to home on 3L O2 by [place]. He will follow up with pulmonology on 09/15/2021. He was to follow up with oncology as directed. He declined home health.  Assessment and Plan: Progressive dyspnea and weakness: DDx: Pneumonitis secondary to resumption of pembrolizumab; pneumonia. The patient is started on IV cefepme and IV Vancomycin. The patient underwent bronchoscopy with Dr. Valeta Harms BAL of LUL was sent for cytology, biopsies and brushings from the LUL infiltrate.   Non-small cell CA: Patient's last dose of pembrolizumab was 08/20/2021. Concern for malignant pleural effusion and malignant nodule per pulmonary.   Anemia of chronic disease: Due to chronic illness and likely nutritional causes. Will check iron studies.    Atrial fibrillation/flutter: Monitor on telemetry. On no nodal agents or anticoagulation at home.    CHF: As per echocardiogram from 07/27/2019 the patient had an EF of 50-55% and Grade 1 diastolic dysfunction. Repeat echocardiogram. Monitor on telemetry.   CKD II: Monitor creatinine, electrolytes, and volume status. Avoid hypotension and nephrotoxic agents.    CAD: Continue ASA 81 mg as at home.   Moderate Protein Calorie Malnutrition: Diet has been liberated. Will add  Ensure as supplement and consult nutrition.   I have seen and examined this patient myself. I have spent 32 minutes in his evaluation and care.   BMI - 18.73  Consultants:  Pulmonology   Oncology   Palliative care  Procedures performed: Bronchoscopy with robotic assistance and biopsies.  Disposition: Home Diet recommendation:  Discharge Diet Orders (From admission, onward)     Start     Ordered   08/31/21 0000  Diet - low sodium heart healthy        08/31/21 1529           Regular diet  DISCHARGE MEDICATION: Allergies as of 08/31/2021   No Known Allergies      Medication List     TAKE these medications    aspirin EC 81 MG tablet Take 1 tablet (81 mg total) by mouth daily.   DRY EYES OP Apply 1 drop to eye as needed (Dry, itching eyes).   feeding supplement Liqd Take 237 mLs by mouth 3 (three) times daily between meals.   Stiolto Respimat 2.5-2.5 MCG/ACT Aers Generic drug: Tiotropium Bromide-Olodaterol Inhale 2 puffs into the lungs daily.       ASK your doctor about these medications    amoxicillin-clavulanate 875-125 MG tablet Commonly known as: Augmentin Take 1 tablet by mouth 2 (two) times daily for 5 days. Ask about: Should I take this medication?   predniSONE 20 MG tablet Commonly known as: DELTASONE Take 3 tablets (60 mg total) by mouth daily for 5 days, THEN 2.5 tablets (50 mg total) daily for 3 days, THEN 2 tablets (40 mg total) daily for 3 days, THEN 1.5 tablets (30 mg total) daily for 3 days, THEN 1 tablet (20 mg total) daily for 3 days, THEN 0.5 tablets (10 mg total) daily for 3 days. Start taking on: August 31, 2021 Ask about: Should I take this medication?        Follow-up Information     Icard, Bradley L, DO. Go to.   Specialty: Pulmonary Disease Contact information: [place] [address] [phone]         Ladell Pier, MD. Go to.   Specialty: Oncology Contact information: [place] [address] [phone]                 Discharge Exam: Danley Danker Weights   08/25/21 2306 08/30/21 0823  Weight: 64 kg 64.4 kg   Vitals:    08/31/21 0844 08/31/21 1214  BP:  139/70  Pulse:  70  Resp:  18  Temp:  98.1 F (36.7 C)  SpO2: 98% 98%   Exam:  Constitutional:  The patient is awake, alert, and oriented x 3. No acute distress. Respiratory:  No increased work of breathing. No wheezes Positive for coarse rales and rhonchi Diminished lung sounds at bases. No tactile fremitus Cardiovascular:  Regular rate and rhythm No murmurs, ectopy, or gallups. No lateral PMI. No thrills. Abdomen:  Abdomen is soft, non-tender, non-distended No hernias, masses, or organomegaly Normoactive bowel sounds.  Musculoskeletal:  No cyanosis, clubbing, or edema Skin:  No rashes, lesions, ulcers palpation of skin: no induration or nodules Neurologic:  CN 2-12 intact Sensation all 4 extremities intact Psychiatric:  Mental status Mood, affect appropriate Orientation to person, place, time  judgment and insight appear intact   Condition at discharge: fair  The results of significant diagnostics from this hospitalization (including imaging, microbiology, ancillary and laboratory) are listed below for reference.  Imaging Studies: DG Chest 2 View  Result Date: 09/15/2021 CLINICAL DATA:  Status post bronchoscopy and biopsy. EXAM: CHEST - 2 VIEW COMPARISON:  08/30/2021 FINDINGS: Stable cardiomediastinal contours. Moderate loculated right pleural effusion and small left pleural effusion appear unchanged from previous exam. Left upper lobe airspace consolidation is improved compared with 08/30/2021. Persistent bilateral upper lobe reticular interstitial opacities. IMPRESSION: 1. Interval improvement in left upper lobe airspace disease. 2. Persistent bilateral pleural effusions, right greater than left. 3. Persistent bilateral upper lobe reticular interstitial opacities. Electronically Signed   By: Kerby Moors M.D.   On: 09/15/2021 15:18    Microbiology: Results for orders placed or performed during the hospital encounter of  08/25/21  Resp Panel by RT-PCR (Flu A&B, Covid) Nasopharyngeal Swab     Status: None   Collection Time: 08/25/21 11:23 PM   Specimen: Nasopharyngeal Swab; Nasopharyngeal(NP) swabs in vial transport medium  Result Value Ref Range Status   SARS Coronavirus 2 by RT PCR NEGATIVE NEGATIVE Final    Comment: (NOTE) SARS-CoV-2 target nucleic acids are NOT DETECTED.  The SARS-CoV-2 RNA is generally detectable in upper respiratory specimens during the acute phase of infection. The lowest concentration of SARS-CoV-2 viral copies this assay can detect is 138 copies/mL. A negative result does not preclude SARS-Cov-2 infection and should not be used as the sole basis for treatment or other patient management decisions. A negative result may occur with  improper specimen collection/handling, submission of specimen other than nasopharyngeal swab, presence of viral mutation(s) within the areas targeted by this assay, and inadequate number of viral copies(<138 copies/mL). A negative result must be combined with clinical observations, patient history, and epidemiological information. The expected result is Negative.  Fact Sheet for Patients:  EntrepreneurPulse.com.au  Fact Sheet for Healthcare Providers:  IncredibleEmployment.be  This test is no t yet approved or cleared by the [place] FDA and  has been authorized for detection and/or diagnosis of SARS-CoV-2 by FDA under an Emergency Use Authorization (EUA). This EUA will remain  in effect (meaning this test can be used) for the duration of the COVID-19 declaration under Section 564(b)(1) of the Act, 21 U.S.C.section 360bbb-3(b)(1), unless the authorization is terminated  or revoked sooner.       Influenza A by PCR NEGATIVE NEGATIVE Final   Influenza B by PCR NEGATIVE NEGATIVE Final    Comment: (NOTE) The Xpert Xpress SARS-CoV-2/FLU/RSV plus assay is intended as an aid in the diagnosis of influenza from  Nasopharyngeal swab specimens and should not be used as a sole basis for treatment. Nasal washings and aspirates are unacceptable for Xpert Xpress SARS-CoV-2/FLU/RSV testing.  Fact Sheet for Patients: EntrepreneurPulse.com.au  Fact Sheet for Healthcare Providers: IncredibleEmployment.be  This test is not yet approved or cleared by the [place] FDA and has been authorized for detection and/or diagnosis of SARS-CoV-2 by FDA under an Emergency Use Authorization (EUA). This EUA will remain in effect (meaning this test can be used) for the duration of the COVID-19 declaration under Section 564(b)(1) of the Act, 21 U.S.C. section 360bbb-3(b)(1), unless the authorization is terminated or revoked.  Performed at [facility], [address], [place], [place] [address]   Culture, blood (routine x 2) Call MD if unable to obtain prior to antibiotics being given     Status: None   Collection Time: 08/26/21  7:24 PM   Specimen: BLOOD  Result Value Ref Range Status   Specimen Description   Final    BLOOD LEFT ANTECUBITAL Performed at  [facility], [place] [address], [place], [place] [address]    Special Requests   Final    BOTTLES DRAWN AEROBIC AND ANAEROBIC Blood Culture adequate volume Performed at [place] [address] [place], [place] [address]    Culture   Final    NO GROWTH 5 DAYS Performed at [facility], [place] [address], [place], [place] [address]    Report Status 08/31/2021 FINAL  Final  Culture, blood (routine x 2) Call MD if unable to obtain prior to antibiotics being given     Status: None   Collection Time: 08/26/21  7:24 PM   Specimen: BLOOD  Result Value Ref Range Status   Specimen Description   Final    BLOOD BLOOD RIGHT HAND Performed at [place] [address], [place], [place] [address]    Special Requests   Final    BOTTLES DRAWN  AEROBIC AND ANAEROBIC Blood Culture adequate volume Performed at [place] [address], [place], [place] [address]    Culture   Final    NO GROWTH 5 DAYS Performed at [facility], [place] [address], [place], [place] [address]    Report Status 08/31/2021 FINAL  Final  Respiratory (~20 pathogens) panel by PCR     Status: None   Collection Time: 08/27/21 12:44 PM   Specimen: Nasopharyngeal Swab; Respiratory  Result Value Ref Range Status   Adenovirus NOT DETECTED NOT DETECTED Final   Coronavirus 229E NOT DETECTED NOT DETECTED Final    Comment: (NOTE) The Coronavirus on the Respiratory Panel, DOES NOT test for the novel  Coronavirus (2019 nCoV)    Coronavirus HKU1 NOT DETECTED NOT DETECTED Final   Coronavirus NL63 NOT DETECTED NOT DETECTED Final   Coronavirus OC43 NOT DETECTED NOT DETECTED Final   Metapneumovirus NOT DETECTED NOT DETECTED Final   Rhinovirus / Enterovirus NOT DETECTED NOT DETECTED Final   Influenza A NOT DETECTED NOT DETECTED Final   Influenza B NOT DETECTED NOT DETECTED Final   Parainfluenza Virus 1 NOT DETECTED NOT DETECTED Final   Parainfluenza Virus 2 NOT DETECTED NOT DETECTED Final   Parainfluenza Virus 3 NOT DETECTED NOT DETECTED Final   Parainfluenza Virus 4 NOT DETECTED NOT DETECTED Final   Respiratory Syncytial Virus NOT DETECTED NOT DETECTED Final   Bordetella pertussis NOT DETECTED NOT DETECTED Final   Bordetella Parapertussis NOT DETECTED NOT DETECTED Final   Chlamydophila pneumoniae NOT DETECTED NOT DETECTED Final   Mycoplasma pneumoniae NOT DETECTED NOT DETECTED Final    Comment: Performed at [facility], [place] [address] [place], [place] [address]  MRSA Next Gen by PCR, Nasal     Status: None   Collection Time: 08/27/21 12:44 PM   Specimen: Nasal Mucosa; Nasal Swab  Result Value Ref Range Status   MRSA by PCR Next Gen NOT DETECTED NOT DETECTED Final    Comment: (NOTE) The GeneXpert MRSA Assay (FDA approved for  NASAL specimens only), is one component of a comprehensive MRSA colonization surveillance program. It is not intended to diagnose MRSA infection nor to guide or monitor treatment for MRSA infections. Test performance is not FDA approved in patients less than 24 years old. Performed at [facility], [place] [address], [place], [place] [address]   Expectorated Sputum Assessment w Gram Stain, Rflx to Resp Cult     Status: None   Collection Time: 08/29/21  8:00 PM   Specimen: Sputum  Result Value Ref Range Status   Specimen Description SPUTUM  Final   Special Requests NONE  Final   Sputum evaluation   Final    THIS SPECIMEN IS ACCEPTABLE FOR SPUTUM CULTURE Performed at [facility], [place] [address] [place], [place] [address]    Report Status 08/31/2021 FINAL  Final  Culture, Respiratory w Gram Stain     Status: None   Collection Time: 08/29/21  8:00 PM   Specimen: SPU  Result Value Ref Range Status   Specimen Description   Final    SPUTUM Performed at [place] [address], [place], [place] [address]    Special Requests   Final    NONE Reflexed from [phone] Performed at [facility], [place] [address], [place], [place] [address]    Gram Stain   Final    NO SQUAMOUS EPITHELIAL CELLS SEEN FEW WBC SEEN FEW GRAM POSITIVE RODS FEW GRAM POSITIVE COCCI    Culture   Final    ABUNDANT Normal respiratory flora-no Staph aureus or Pseudomonas seen Performed at [facility], [address] [place], [place] [address]    Report Status 09/02/2021 FINAL  Final  Fungus Culture With Stain     Status: None   Collection Time: 08/30/21 12:04 PM   Specimen: PATH Cytology Navigation; Tissue  Result Value Ref Range Status   Fungus Stain Final report  Final   Fungus (Mycology) Culture Final report  Final    Comment: (NOTE) Performed At: [facility] [place], [place] [id number] Rush Farmer  MD VV:[id number]    Fungal Source TISSUE  Final    Comment: LEFT UPPER LUNG BIOPSY FORCEPS Performed at [facility], [place] [address], [place], [place] [address]   Aerobic/Anaerobic Culture w Gram Stain (surgical/deep wound)     Status: None   Collection Time: 08/30/21 12:04 PM   Specimen: PATH Cytology Navigation; Tissue  Result Value Ref Range Status   Specimen Description TISSUE LEFT UPPER LUNG  Final   Special Requests FORCEPS BIOSPY  Final   Gram Stain   Final    NO SQUAMOUS EPITHELIAL CELLS SEEN NO WBC SEEN NO ORGANISMS SEEN    Culture   Final    No growth aerobically or anaerobically. Performed at [facility], [place] [address] [place], [place] [address]    Report Status 09/04/2021 FINAL  Final  Acid Fast Smear (AFB)     Status: None   Collection Time: 08/30/21 12:04 PM   Specimen: PATH Cytology Navigation; Tissue  Result Value Ref Range Status   AFB Specimen Processing Concentration  Final   Acid Fast Smear Negative  Final    Comment: (NOTE) Performed At: [facility] [place], [place] [id number] Rush Farmer MD [id number]    Source (AFB) TISSUE  Final    Comment: LEFT UPPER LUNG BIOPSY FORCEPS Performed at [facility], [place] [address] [place], [place] [address]   Fungus Culture Result     Status: None   Collection Time: 08/30/21 12:04 PM  Result Value Ref Range Status   Result 1 Comment  Final    Comment: (NOTE) KOH/Calcofluor preparation:  no fungus observed. Performed At: [facility] [place], [place] [id number] Rush Farmer MD PR:[id number]   Fungal organism reflex     Status: None   Collection Time: 08/30/21 12:04 PM  Result Value Ref Range Status   Fungal result  1 Comment  Final    Comment: (NOTE) No yeast or mold isolated after 4 weeks. Performed At: [facility] [address] [place], [place] [id number] Rush Farmer MD TO:[id number]   Fungus Culture With  Stain     Status: None   Collection Time: 08/30/21 12:05 PM   Specimen: Bronchial Washing, Left; Respiratory  Result Value Ref Range Status   Fungus Stain Final report  Final   Fungus (Mycology) Culture Final report  Final    Comment: (NOTE) Performed At: [facility] [address] [place], [place] [id number] Rush Farmer MD [id number]    Fungal Source BRONCHIAL WASHINGS  Final    Comment: LEFT Performed at [facility], [place] [address], [place], [place] [address]   Aerobic/Anaerobic Culture w Gram Stain (surgical/deep wound)     Status: None   Collection Time: 08/30/21 12:05 PM   Specimen: Bronchial Washing, Left; Respiratory  Result Value Ref Range Status   Specimen Description BRONCHIAL WASHINGS LEFT  Final   Special Requests NONE  Final   Gram Stain   Final    NO SQUAMOUS EPITHELIAL CELLS SEEN FEW WBC SEEN NO ORGANISMS SEEN    Culture   Final    No growth aerobically or anaerobically. Performed at [facility], [place] [address] [place], [place] [address]    Report Status 09/04/2021 FINAL  Final  Acid Fast Smear (AFB)     Status: None   Collection Time: 08/30/21 12:05 PM   Specimen: Bronchial Washing, Left; Respiratory  Result Value Ref Range Status   AFB Specimen Processing Concentration  Final   Acid Fast Smear Negative  Final    Comment: (NOTE) Performed At: [facility] [place], [place] [id number] Rush Farmer MD [id number]    Source (AFB) BRONCHIAL WASHINGS  Final    Comment: LEFT Performed at [facility], [place] [address], [place], [place] [address]   Fungus Culture Result     Status: None   Collection Time: 08/30/21 12:05 PM  Result Value Ref Range Status   Result 1 Comment  Final    Comment: (NOTE) KOH/Calcofluor preparation:  no fungus observed. Performed At: [facility] [place], [place] [id number] Rush Farmer MD [id number]   Fungal organism reflex     Status:  None   Collection Time: 08/30/21 12:05 PM  Result Value Ref Range Status   Fungal result 1 Comment  Final    Comment: (NOTE) No yeast or mold isolated after 4 weeks. Performed At: [facility] [place], [place] [id number] Rush Farmer MD AJ:[id number]     Labs: CBC: No results for input(s): WBC, NEUTROABS, HGB, HCT, MCV, PLT in the last 168 hours. Basic Metabolic Panel: No results for input(s): NA, K, CL, CO2, GLUCOSE, BUN, CREATININE, CALCIUM, MG, PHOS in the last 168 hours. Liver Function Tests: No results for input(s): AST, ALT, ALKPHOS, BILITOT, PROT, ALBUMIN in the last 168 hours. CBG: No results for input(s): GLUCAP in the last 168 hours.  Discharge time spent: greater than 30 minutes.  Signed: Mahathi Pokorney, DO [facility] 10/01/2021

## 2021-10-06 ENCOUNTER — Ambulatory Visit: Payer: Medicare Other | Admitting: Nurse Practitioner

## 2021-10-11 DIAGNOSIS — Z20822 Contact with and (suspected) exposure to covid-19: Secondary | ICD-10-CM | POA: Diagnosis not present

## 2021-10-12 ENCOUNTER — Telehealth: Payer: Self-pay

## 2021-10-12 NOTE — Telephone Encounter (Signed)
TC from Pt stating he canceled his appointment on 10/06/21 because he is having pain when he walks. Pt states he is having left side hip and ankle pain. Which also radiates to hip butt. Pt states his left leg is weak. Informed Pt to call PCP to get an appointment but Dr [name] would also like to see him next week. Informed Pt if symptoms should get worse to go to the ER Pt verbalized understanding.

## 2021-10-13 DIAGNOSIS — C3490 Malignant neoplasm of unspecified part of unspecified bronchus or lung: Secondary | ICD-10-CM | POA: Diagnosis not present

## 2021-10-13 DIAGNOSIS — C7951 Secondary malignant neoplasm of bone: Secondary | ICD-10-CM | POA: Diagnosis not present

## 2021-10-13 LAB — ACID FAST CULTURE WITH REFLEXED SENSITIVITIES (MYCOBACTERIA)
Acid Fast Culture: NEGATIVE
Acid Fast Culture: NEGATIVE

## 2021-10-15 ENCOUNTER — Other Ambulatory Visit: Payer: Self-pay | Admitting: Pulmonary Disease

## 2021-10-15 DIAGNOSIS — J9611 Chronic respiratory failure with hypoxia: Secondary | ICD-10-CM

## 2021-10-18 ENCOUNTER — Other Ambulatory Visit: Payer: Self-pay

## 2021-10-18 ENCOUNTER — Ambulatory Visit (INDEPENDENT_AMBULATORY_CARE_PROVIDER_SITE_OTHER): Payer: Medicare Other | Admitting: Pulmonary Disease

## 2021-10-18 ENCOUNTER — Encounter: Payer: Self-pay | Admitting: Pulmonary Disease

## 2021-10-18 VITALS — BP 106/60 | HR 96 | Temp 97.7°F | Ht 73.0 in | Wt 142.4 lb

## 2021-10-18 DIAGNOSIS — J9819 Other pulmonary collapse: Secondary | ICD-10-CM | POA: Diagnosis not present

## 2021-10-18 DIAGNOSIS — R0600 Dyspnea, unspecified: Secondary | ICD-10-CM

## 2021-10-18 DIAGNOSIS — J9611 Chronic respiratory failure with hypoxia: Secondary | ICD-10-CM | POA: Diagnosis not present

## 2021-10-18 DIAGNOSIS — Z9889 Other specified postprocedural states: Secondary | ICD-10-CM | POA: Diagnosis not present

## 2021-10-18 DIAGNOSIS — J984 Other disorders of lung: Secondary | ICD-10-CM

## 2021-10-18 DIAGNOSIS — J90 Pleural effusion, not elsewhere classified: Secondary | ICD-10-CM | POA: Diagnosis not present

## 2021-10-18 DIAGNOSIS — J439 Emphysema, unspecified: Secondary | ICD-10-CM

## 2021-10-18 DIAGNOSIS — R918 Other nonspecific abnormal finding of lung field: Secondary | ICD-10-CM | POA: Diagnosis not present

## 2021-10-18 DIAGNOSIS — C3491 Malignant neoplasm of unspecified part of right bronchus or lung: Secondary | ICD-10-CM

## 2021-10-18 DIAGNOSIS — M5432 Sciatica, left side: Secondary | ICD-10-CM | POA: Diagnosis not present

## 2021-10-18 LAB — PULMONARY FUNCTION TEST
DL/VA % pred: 76 %
DL/VA: 3.01 ml/min/mmHg/L
DLCO cor % pred: 42 %
DLCO cor: 11.67 ml/min/mmHg
DLCO unc % pred: 36 %
DLCO unc: 10 ml/min/mmHg
FEF 25-75 Post: 1.64 L/sec
FEF 25-75 Pre: 1.36 L/sec
FEF2575-%Change-Post: 20 %
FEF2575-%Pred-Post: 63 %
FEF2575-%Pred-Pre: 53 %
FEV1-%Change-Post: 2 %
FEV1-%Pred-Post: 40 %
FEV1-%Pred-Pre: 39 %
FEV1-Post: 1.42 L
FEV1-Pre: 1.39 L
FEV1FVC-%Change-Post: 5 %
FEV1FVC-%Pred-Pre: 116 %
FEV6-%Change-Post: 0 %
FEV6-%Pred-Post: 35 %
FEV6-%Pred-Pre: 35 %
FEV6-Post: 1.6 L
FEV6-Pre: 1.61 L
FEV6FVC-%Pred-Post: 105 %
FEV6FVC-%Pred-Pre: 105 %
FVC-%Change-Post: -2 %
FVC-%Pred-Post: 33 %
FVC-%Pred-Pre: 34 %
FVC-Post: 1.6 L
FVC-Pre: 1.64 L
Post FEV1/FVC ratio: 89 %
Post FEV6/FVC ratio: 100 %
Pre FEV1/FVC ratio: 85 %
Pre FEV6/FVC Ratio: 100 %

## 2021-10-18 NOTE — Progress Notes (Signed)
Synopsis: Referred in December 2022 for pleural effusion, history of lung cancer by [name], MD  Subjective:   PATIENT ID: Brian Ramirez, MRN: [id number]  Chief Complaint  Patient presents with   Follow-up    Follow up. Patient says he still has shortness of breath. Patient is here to also go over PFT results.    This is a 76 year old gentleman, past medical history of congestive heart failure, atrial flutter, history of hiatal hernia, right-sided pleural effusion.  He was diagnosed with lung cancer in February 2020 currently managed by Dr. [name].Patient was last seen in the office by Ned Card, NP from oncology.  Patient has also had some progressive pulmonary densities within the chest on recent CT imaging and was referred for evaluation and consideration for bronchoscopy biopsy and management of pleural effusion.  Patient CT chest was completed on 07/26/2021.  CT scan of the chest reveals progressive change in subsolid lesion of the left lower lobe which is different from his previous diagnosis.  He also has a loculated effusion on the right chest which is presumed malignant.  He also complains today of ongoing cough and sputum production.  Over the past couple of months has had increased shortness of breath.  He did have a thoracentesis that was completed by interventional radiology that made his shortness of breath worse at the time however the fluid that was tested as he states did not have any malignant cells present.  OV 10/18/2021: Here today for follow-up.  Overall doing fine.  Recurrent pleural effusion on the right side has been stable.  Last seen by me on his hospitalization.  Was treated with antibiotics and steroids.  He had a bronchoscopy during that time, no evidence of malignancy.  Right-sided effusion was left alone as the last time he had it tapped he had significant pain that reaccumulated quickly suspected it to be trapped.  He recent  completed 2 weeks of prednisone with oncology.  He is dealing with some left-sided leg and back pain.   Oncology History  Lung cancer ([place])  09/20/2018 Initial Diagnosis   Lung cancer ([place])   09/21/2018 - 11/28/2018 Chemotherapy   The patient had dexamethasone (DECADRON) 4 MG tablet, 1 of 1 cycle, Start date: --, End date: -- palonosetron (ALOXI) injection 0.25 mg, 0.25 mg, Intravenous,  Once, 4 of 6 cycles Administration: 0.25 mg (09/22/2018), 0.25 mg (11/06/2018), 0.25 mg (11/28/2018), 0.25 mg (10/15/2018) PEMEtrexed (ALIMTA) 1,000 mg in sodium chloride 0.9 % 100 mL chemo infusion, 515 mg/m2 = 975 mg, Intravenous,  Once, 4 of 6 cycles Dose modification: 515 mg/m2 (original dose 500 mg/m2, Cycle 5, Reason: Other (see comments), Comment: match previous doses) Administration: 1,000 mg (09/22/2018), 1,000 mg (11/06/2018), 1,000 mg (11/28/2018), 1,000 mg (10/15/2018) CARBOplatin (PARAPLATIN) 380 mg in sodium chloride 0.9 % 250 mL chemo infusion, 380 mg (100 % of original dose 377.2 mg), Intravenous,  Once, 4 of 6 cycles Dose modification:   (original dose 377.2 mg, Cycle 1, Reason: Provider Judgment) Administration: 380 mg (09/22/2018), 380 mg (11/06/2018), 380 mg (11/28/2018), 380 mg (10/15/2018)   for chemotherapy treatment.     09/28/2018 -  Chemotherapy   Patient is on Treatment Plan : LUNG Pembrolizumab q42d     01/09/2019 - 04/05/2019 Chemotherapy   The patient had ondansetron (ZOFRAN) 8 mg in sodium chloride 0.9 % 50 mL IVPB, 8 mg (100 % of original dose 8 mg), Intravenous,  Once, 2 of 2 cycles Dose modification:  8 mg (original dose 8 mg, Cycle 4) PEMEtrexed (ALIMTA) 900 mg in sodium chloride 0.9 % 100 mL chemo infusion, 480 mg/m2 = 925 mg, Intravenous,  Once, 6 of 7 cycles Administration: 900 mg (01/09/2019), 900 mg (02/21/2019), 900 mg (03/14/2019), 900 mg (01/31/2019), 900 mg (04/05/2019)   for chemotherapy treatment.        Past Medical History:  Diagnosis Date   Acute congestive heart failure ([place])     Anemia of chronic disease 10/08/2018   Anticoagulated    Atrial flutter with rapid ventricular response (IXL) 10/08/2018   CAD in native artery 06/01/2021   Difficult airway for intubation 09/26/2016   Difficult intubation    Jaw only opens up half way to intubate.   Edema of both lower extremities 10/09/2018   Essential hypertension 06/01/2021   Exertional dyspnea 06/01/2021   Goals of care, counseling/discussion 09/20/2018   Hernia 2012   History of hiatal hernia    History of kidney stones    Left inguinal hernia s/p lap repair 11/22/2016 09/26/2016   Lung cancer ([place]) 09/20/2018   Lung mass 09/04/2018   Malnutrition of moderate degree 09/20/2018   Mass of right lung 09/04/2018   New onset atrial fibrillation ([facility])    Personal history of kidney stones 1992   Pleural effusion 09/04/2018   Pleural effusion on right    Pressure injury of skin 09/20/2018   SOB (shortness of breath) 09/19/2018     Family History  Problem Relation Age of Onset   Heart disease Father    Cancer Brother      Past Surgical History:  Procedure Laterality Date   BRONCHIAL BIOPSY  08/30/2021   Procedure: BRONCHIAL BIOPSIES;  Surgeon: Garner Nash, DO;  Location: [facility];  Service: Pulmonary;;   BRONCHIAL NEEDLE ASPIRATION BIOPSY  08/30/2021   Procedure: BRONCHIAL NEEDLE ASPIRATION BIOPSIES;  Surgeon: Garner Nash, DO;  Location: [facility];  Service: Pulmonary;;   BRONCHIAL WASHINGS  08/30/2021   Procedure: BRONCHIAL WASHINGS;  Surgeon: Garner Nash, DO;  Location: [facility];  Service: Pulmonary;;   HERNIA REPAIR  2012   inguinal   INGUINAL HERNIA REPAIR Left 11/22/2016   Procedure: LAPAROSCOPIC  REPAIR OF LEFT INGUINAL HERNIA WITH MESH;  Surgeon: [name], MD;  Location: [facility];  Service: General;  Laterality: Left;   IR INSTILL VIA CHEST TUBE AGENT FOR FIBRINOLYSIS INI DAY  10/10/2018   IR INSTILL VIA CHEST TUBE AGENT FOR FIBRINOLYSIS INI DAY  10/31/2018   IR PERC PLEURAL  DRAIN W/INDWELL CATH W/IMG GUIDE  09/11/2018   IR REMOVAL OF PLURAL CATH W/CUFF  11/13/2018   IR THORACENTESIS ASP PLEURAL SPACE W/IMG GUIDE  07/06/2021    Social History   Socioeconomic History   Marital status: Single    Spouse name: Not on file   Number of children: Not on file   Years of education: Not on file   Highest education level: Not on file  Occupational History   Not on file  Tobacco Use   Smoking status: Former    Packs/day: 1.00    Years: 30.00    Pack years: 30.00    Types: Cigarettes    Quit date: 62    Years since quitting: 25.1   Smokeless tobacco: Never  Vaping Use   Vaping Use: Never used  Substance and Sexual Activity   Alcohol use: Yes    Alcohol/week: 1.0 standard drink    Types: 1 Glasses of wine per week  Drug use: No   Sexual activity: Not on file  Other Topics Concern   Not on file  Social History Narrative   Not on file   Social Determinants of Health   Financial Resource Strain: Not on file  Food Insecurity: Not on file  Transportation Needs: Not on file  Physical Activity: Not on file  Stress: Not on file  Social Connections: Not on file  Intimate Partner Violence: Not on file     No Known Allergies   Outpatient Medications Prior to Visit  Medication Sig Dispense Refill   albuterol (VENTOLIN HFA) 108 (90 Base) MCG/ACT inhaler Inhale 2 puffs into the lungs every 6 (six) hours as needed for wheezing or shortness of breath. 18 g 2   Artificial Tear Ointment (DRY EYES OP) Apply 1 drop to eye as needed (Dry, itching eyes).     aspirin EC 81 MG tablet Take 1 tablet (81 mg total) by mouth daily. 90 tablet 3   feeding supplement (ENSURE ENLIVE / ENSURE PLUS) LIQD Take 237 mLs by mouth 3 (three) times daily between meals. 237 mL 12   predniSONE (DELTASONE) 20 MG tablet Take 60 mg daily for 7 days, then 40 mg daily for 7 days--additional taper instructions after next office visit 50 tablet 1   Tiotropium Bromide-Olodaterol (STIOLTO  RESPIMAT) 2.5-2.5 MCG/ACT AERS Inhale 2 puffs into the lungs daily. 4 g 0   Tiotropium Bromide-Olodaterol (STIOLTO RESPIMAT) 2.5-2.5 MCG/ACT AERS Inhale 2 puffs into the lungs daily. 4 g 0   No facility-administered medications prior to visit.    Review of Systems  Constitutional:  Negative for chills, fever, malaise/fatigue and weight loss.  HENT:  Negative for hearing loss, sore throat and tinnitus.   Eyes:  Negative for blurred vision and double vision.  Respiratory:  Positive for shortness of breath. Negative for cough, hemoptysis, sputum production, wheezing and stridor.   Cardiovascular:  Negative for chest pain, palpitations, orthopnea, leg swelling and PND.  Gastrointestinal:  Negative for abdominal pain, constipation, diarrhea, heartburn, nausea and vomiting.  Genitourinary:  Negative for dysuria, hematuria and urgency.  Musculoskeletal:  Negative for joint pain and myalgias.  Skin:  Negative for itching and rash.  Neurological:  Negative for dizziness, tingling, weakness and headaches.  Endo/Heme/Allergies:  Negative for environmental allergies. Does not bruise/bleed easily.  Psychiatric/Behavioral:  Negative for depression. The patient is not nervous/anxious and does not have insomnia.   All other systems reviewed and are negative.   Objective:  Physical Exam Vitals reviewed.  Constitutional:      General: He is not in acute distress.    Appearance: He is well-developed.  HENT:     Head: Normocephalic and atraumatic.  Eyes:     General: No scleral icterus.    Conjunctiva/sclera: Conjunctivae normal.     Pupils: Pupils are equal, round, and reactive to light.  Neck:     Vascular: No JVD.     Trachea: No tracheal deviation.  Cardiovascular:     Rate and Rhythm: Normal rate and regular rhythm.     Heart sounds: Normal heart sounds. No murmur heard. Pulmonary:     Effort: Pulmonary effort is normal. No tachypnea, accessory muscle usage or respiratory distress.      Breath sounds: No stridor. No wheezing, rhonchi or rales.     Comments: Breath sounds absent in the right base Abdominal:     General: There is no distension.     Palpations: Abdomen is soft.  Tenderness: There is no abdominal tenderness.  Musculoskeletal:        General: No tenderness.     Cervical back: Neck supple.  Lymphadenopathy:     Cervical: No cervical adenopathy.  Skin:    General: Skin is warm and dry.     Capillary Refill: Capillary refill takes less than 2 seconds.     Findings: No rash.  Neurological:     Mental Status: He is alert and oriented to person, place, and time.  Psychiatric:        Behavior: Behavior normal.     Vitals:   10/18/21 1346 10/18/21 1350  BP: 106/60 106/60  Pulse: 96 96  Temp:  97.7 F (36.5 C)  TempSrc:  Oral  SpO2: 97% 97%  Weight:  142 lb 6.4 oz (64.6 kg)  Height:  6\' 1"  (1.854 m)   97% on RA BMI Readings from Last 3 Encounters:  10/18/21 18.79 kg/m  09/23/21 19.07 kg/m  09/15/21 19.18 kg/m   Wt Readings from Last 3 Encounters:  10/18/21 142 lb 6.4 oz (64.6 kg)  09/23/21 140 lb 9.6 oz (63.8 kg)  09/15/21 141 lb 6.4 oz (64.1 kg)     CBC    Component Value Date/Time   WBC 13.4 (H) 08/31/2021 0731   RBC 3.78 (L) 08/31/2021 0731   HGB 10.4 (L) 08/31/2021 0731   HGB 11.4 (L) 08/20/2021 0813   HCT 34.5 (L) 08/31/2021 0731   PLT 330 08/31/2021 0731   PLT 407 (H) 08/20/2021 0813   MCV 91.3 08/31/2021 0731   MCH 27.5 08/31/2021 0731   MCHC 30.1 08/31/2021 0731   RDW 14.3 08/31/2021 0731   LYMPHSABS 0.4 (L) 08/31/2021 0731   MONOABS 0.8 08/31/2021 0731   EOSABS 0.0 08/31/2021 0731   BASOSABS 0.0 08/31/2021 0731      Chest Imaging: CT scan of the chest 07/26/2021: Subsolid lesion in the left lower lobe with progressive changes surrounding groundglass concerning for malignancy loculated right-sided pleural effusion with pleural rind.  CT chest 09/26/2021: Persistent infiltrates bilaterally, loculated effusion on  the right. The patient's images have been independently reviewed by me.    Pulmonary Functions Testing Results: PFT Results Latest Ref Rng & Units 10/18/2021  FVC-Pre L 1.64  FVC-Predicted Pre % 34  FVC-Post L 1.60  FVC-Predicted Post % 33  Pre FEV1/FVC % % 85  Post FEV1/FCV % % 89  FEV1-Pre L 1.39  FEV1-Predicted Pre % 39  FEV1-Post L 1.42  DLCO uncorrected ml/min/mmHg 10.00  DLCO UNC% % 36  DLCO corrected ml/min/mmHg 11.67  DLCO COR %Predicted % 42  DLVA Predicted % 76    FeNO:   Pathology:   Echocardiogram:   Heart Catheterization:     Assessment & Plan:     ICD-10-CM   1. Mixed restrictive and obstructive lung disease ([place])  J43.9 AMB referral to pulmonary rehabilitation   J98.4     2. Pulmonary infiltrate  R91.8     3. Chronic respiratory failure with hypoxia (HCC)  J96.11     4. NSCLC of right lung ([place])  C34.91     5. Recurrent right pleural effusion  J90     6. Dyspnea, unspecified type  R06.00     7. 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  Has been on immune therapy.  Had bilateral infiltrates in the lung concerning for pneumonitis was treated during hospitalization with antibiotics steroids.  Recent steroid taper by oncology.  I think his right lung is trapped with a loculated effusion.  I am not sure that repeat tapping it would make any benefit.  It actually causes him pain with drainage now.  Plan: Continue follow-up imaging with medical oncology. I will think there is any reason to restart prednisone at this time. He has really significant abnormal pulmonary function test.  He has a mixed restrictive and obstructive defect. His restrictive defect is related to his interstitial disease as well as trapped lung. I am not  sure that we will ever be able to wean him off oxygen even though patient is hopeful. I have referred to pulmonary rehab. Follow-up with [place] in 6 months or as needed.   Current Outpatient Medications:    albuterol (VENTOLIN HFA) 108 (90 Base) MCG/ACT inhaler, Inhale 2 puffs into the lungs every 6 (six) hours as needed for wheezing or shortness of breath., Disp: 18 g, Rfl: 2   Artificial Tear Ointment (DRY EYES OP), Apply 1 drop to eye as needed (Dry, itching eyes)., Disp: , Rfl:    aspirin EC 81 MG tablet, Take 1 tablet (81 mg total) by mouth daily., Disp: 90 tablet, Rfl: 3   feeding supplement (ENSURE ENLIVE / ENSURE PLUS) LIQD, Take 237 mLs by mouth 3 (three) times daily between meals., Disp: 237 mL, Rfl: 12   predniSONE (DELTASONE) 20 MG tablet, Take 60 mg daily for 7 days, then 40 mg daily for 7 days--additional taper instructions after next office visit, Disp: 50 tablet, Rfl: 1   Tiotropium Bromide-Olodaterol (STIOLTO RESPIMAT) 2.5-2.5 MCG/ACT AERS, Inhale 2 puffs into the lungs daily., Disp: 4 g, Rfl: 0   Tiotropium Bromide-Olodaterol (STIOLTO RESPIMAT) 2.5-2.5 MCG/ACT AERS, Inhale 2 puffs into the lungs daily., Disp: 4 g, Rfl: 0   Garner Nash, DO [facility] 10/18/2021 4:52 PM

## 2021-10-18 NOTE — Patient Instructions (Signed)
Thank you for visiting Dr. Valeta Harms at [facility]. ?Today we recommend the following: ? ?Orders Placed This Encounter  ?Procedures  ? AMB referral to pulmonary rehabilitation  ? ?Return in about 6 months (around 04/20/2022) for with Eric Form, NP, or Dr. Valeta Harms. ? ? ? ?Please do your part to reduce the spread of COVID-19.  ? ?

## 2021-10-18 NOTE — Progress Notes (Signed)
Pre and post spirometry and DLCO completed today  ?

## 2021-10-20 ENCOUNTER — Encounter (HOSPITAL_COMMUNITY): Payer: Self-pay

## 2021-10-21 ENCOUNTER — Other Ambulatory Visit: Payer: Self-pay

## 2021-10-21 ENCOUNTER — Inpatient Hospital Stay: Payer: Medicare Other | Attending: Nurse Practitioner | Admitting: Oncology

## 2021-10-21 VITALS — BP 110/96 | HR 93 | Temp 98.1°F | Resp 18 | Ht 73.0 in | Wt 143.0 lb

## 2021-10-21 DIAGNOSIS — C771 Secondary and unspecified malignant neoplasm of intrathoracic lymph nodes: Secondary | ICD-10-CM | POA: Insufficient documentation

## 2021-10-21 DIAGNOSIS — N289 Disorder of kidney and ureter, unspecified: Secondary | ICD-10-CM | POA: Diagnosis not present

## 2021-10-21 DIAGNOSIS — J90 Pleural effusion, not elsewhere classified: Secondary | ICD-10-CM | POA: Diagnosis not present

## 2021-10-21 DIAGNOSIS — Z87891 Personal history of nicotine dependence: Secondary | ICD-10-CM | POA: Diagnosis not present

## 2021-10-21 DIAGNOSIS — C782 Secondary malignant neoplasm of pleura: Secondary | ICD-10-CM | POA: Insufficient documentation

## 2021-10-21 DIAGNOSIS — C3490 Malignant neoplasm of unspecified part of unspecified bronchus or lung: Secondary | ICD-10-CM | POA: Diagnosis not present

## 2021-10-21 DIAGNOSIS — C3491 Malignant neoplasm of unspecified part of right bronchus or lung: Secondary | ICD-10-CM | POA: Insufficient documentation

## 2021-10-21 DIAGNOSIS — M79605 Pain in left leg: Secondary | ICD-10-CM | POA: Insufficient documentation

## 2021-10-21 DIAGNOSIS — M899 Disorder of bone, unspecified: Secondary | ICD-10-CM | POA: Insufficient documentation

## 2021-10-21 DIAGNOSIS — M6281 Muscle weakness (generalized): Secondary | ICD-10-CM | POA: Diagnosis not present

## 2021-10-21 MED ORDER — HYDROCODONE-ACETAMINOPHEN 5-325 MG PO TABS: 1.0000 | ORAL_TABLET | Freq: Four times a day (QID) | ORAL | 0 refills | Status: DC | PRN

## 2021-10-21 NOTE — Progress Notes (Signed)
[facility] OFFICE PROGRESS NOTE   Diagnosis: Non-small cell lung cancer  INTERVAL HISTORY:   Mr. Bonzo returns as scheduled.  He reports developing pain at the left buttock and left leg beginning 3 to 4 days after his last office visit here.  The pain has persisted.  He was seen at [facility] urgent [facility] 10/18/2021.  He was placed on gabapentin.  This helps the pain partially but causes somnolence.  No difficulty with bowel or bladder control.  The left leg is weak.  He reports stable exertional dyspnea.  He saw Dr. Valeta Harms 10/18/2021.  He does not recommend resuming steroids.  Mr. Brandenberger is no longer taking prednisone.  He continues oxygen at 3 L/min.  Objective:  Vital signs in last 24 hours:  Blood pressure (!) 110/96, pulse 93, temperature 98.1 F (36.7 C), temperature source Oral, resp. rate 18, height _0  (1.854 m), weight 143 lb (64.9 kg), SpO2 97 %.    HEENT: No thrush Resp: Decreased breath sounds at the right chest, inspiratory rhonchi at the bilateral anterior chest, no respiratory distress Cardio: Irregular, tachycardia GI: No hepatosplenomegaly Vascular: No leg edema Neuro: 4/5 strength with flexion at the left hip, 3-4/5 strength with dorsiflexion of the left foot Musculoskeletal: No spine tenderness   Lab Results:  Lab Results  Component Value Date   WBC 13.4 (H) 08/31/2021   HGB 10.4 (L) 08/31/2021   HCT 34.5 (L) 08/31/2021   MCV 91.3 08/31/2021   PLT 330 08/31/2021   NEUTROABS 12.2 (H) 08/31/2021    CMP  Lab Results  Component Value Date   NA 139 08/31/2021   K 4.6 08/31/2021   CL 103 08/31/2021   CO2 32 08/31/2021   GLUCOSE 138 (H) 08/31/2021   BUN 42 (H) 08/31/2021   CREATININE 1.23 08/31/2021   CALCIUM 8.9 08/31/2021   PROT 6.7 08/30/2021   ALBUMIN 2.5 (L) 08/30/2021   AST 23 08/30/2021   ALT 25 08/30/2021   ALKPHOS 73 08/30/2021   BILITOT 0.4 08/30/2021   GFRNONAA >60 08/31/2021   GFRAA 46 (L) 05/08/2020    Medications: I  have reviewed the patient's current medications.   Assessment/Plan: Metastatic non-small cell lung cancer  large right pleural effusion, right lung mass, right pleural-based masses, left lung nodules Right thoracentesis 09/04/2018-negative cytology CT biopsy of right lateral pleural mass 09/07/2018- poorly differentiated non-small cell carcinoma, malignant cells positive for cytokeratin AE1/AE3 and cytokeratin 8/18, histology favoring adenocarcinoma, MSS, tumor mutation burden 5.no EGFR, ALK, BRAF alteration.  PDL 1 rearrangement PDL 1 tumor proportion score-100%  Cycle 1 Alimta/carboplatin 09/22/2018, pembrolizumab 09/28/2018 Cycle 2 Alimta/carboplatin/pembrolizumab 10/15/2018 Cycle 3 Alimta/carboplatin/pembrolizumab 11/06/2018 Cycle 4 Alimta/carboplatin/pembrolizumab 11/28/2018 CT chest 12/14/2018- response to therapy of lung primary/primaries, thoracic nodal and pulmonary metastasis.  Decrease in right pleural effusion with improvement in pleural-based metastasis.  New T10 heterogeneous sclerotic density likely due to healing of previously CT occult metastasis. Pembrolizumab 12/19/2018 Pembrolizumab/Alimta starting 01/09/2019 Pembrolizumab/Alimta 01/31/2019 Pembrolizumab/Alimta 02/21/2019 Pembrolizumab/Alimta 03/14/2019 CT 04/01/2019- no evidence of disease progression, small loculated right pleural effusion and right basilar pleural nodularity-stable, stable left lower lobe nodule, persistent sclerosis at T10 Pembrolizumab/Alimta 04/05/2019 Pembrolizumab 05/07/2019 Pembrolizumab 05/28/2019 Pembrolizumab 06/18/2019 Pembrolizumab 07/09/2019 Chest CT 07/26/2019-moderate to large stable exudative right effusion with considerable pleural thickening.  Nodularity at the right lung base along pleural thickening similar to prior.  Stable left paramediastinal groundglass density nodule.  Stable sclerotic lesion posteriorly at the T10 vertebral level. Pembrolizumab 07/30/2019 Pembrolizumab 08/21/2019-changed to every  6-week dosing Pembrolizumab 10/11/2019  Pembrolizumab 11/22/2019 CT chest 01/03/2020-chronic appearing loculated effusion right chest with signs of pleural thickening smaller than on prior exam; similar appearance of tree-in-bud bud nodularity in the right upper lobe and left lung apex along with bronchiectatic changes in the lingula and middle lobe; similar appearance of sclerosis T10 vertebral body Pembrolizumab 01/03/2020 Pembrolizumab 02/14/2020 Pembrolizumab 03/27/2020 Chest CT 05/06/2020-unchanged posttreatment appearance of the chest with a chronic, loculated moderate right pleural effusion with associated atelectasis or consolidation and pleural thickening.  Stable small pulmonary nodules measuring 3 mm or smaller.  Pembrolizumab 05/08/2020 Pembrolizumab 06/19/2020 Pembrolizumab 08/03/2020 Pembrolizumab 09/14/2020 CT chest 11/04/2020-persistent and enlarging complex loculated right pleural fluid collection.  No findings suspicious for recurrent tumor, mediastinal/hilar adenopathy or pulmonary metastatic disease. CT chest 02/04/2021-chronic loculated right pleural effusion, new ill-defined right upper lobe nodule-nonspecific, coronary and aortic valve calcifications  (next scan planned at a 94-monthinterval) CT chest 05/21/2021-substantial increase peripheral airspace opacity in the right upper lobe and superior segment right lower lobe.  Appearance suspicious for multi lobar pneumonia.  Stable thick-walled chronic large right pleural effusion.  New part solid 1.2 cm superior segment left lower lobe nodule.  Stable left upper lobe groundglass density pulmonary nodule.  Increased bandlike nodular opacity laterally in the left lower lobe. 10-day course of Levaquin   Chest x-ray 06/09/2021-new patchy airspace disease left upper lobe, right upper lobe airspace disease has minimally decreased but not resolved.  Stable loculated right pleural effusion Thoracentesis 07/06/2021-350 cc of thick bloody fluid removed.   Cytology with atypical cells present.  Culture with no growth. CT chest 07/26/2021-progressive ill-defined nodular airspace process in the left lower lobe with hazy surrounding groundglass opacity.  Significant progression of ill-defined groundglass opacity and slight interstitial thickening left upper lobe.  Improved appearance right upper lobe.  Stable thick-walled loculated right pleural fluid collection.  New small left pleural effusion.  Stable underlying lung disease.  No mediastinal or hilar mass or adenopathy. Pembrolizumab 07/30/2021 Pembrolizumab 08/20/2021 CT chest 08/26/2021-new left upper lobe airspace disease with interlobar septal thickening, new airspace disease in the superior segment of the left lower lobe, minimal patchy groundglass opacities in the right upper lobe, increased left pleural effusion, new prominent prevascular node Antibiotics and steroid taper with clinical improvement Bronchoscopy with biopsies and bronchoalveolar lavage from the left upper lobe 08/30/2021-culture negative, transbronchial biopsy and brushing negative for malignancy   2.  Severe anemia-likely secondary to bleeding in the right pleural space and metastatic carcinoma, red cell transfusions 09/09/2018 09/10/2018, 09/23/2018-improved; progressive 04/26/2019, red cell transfusion.  Improved. 3.  Dyspnea secondary to the large right pleural effusion and anemia Right Pleurx catheter placed 09/11/2018 Chest x-ray 10/09/2018- large right pleural effusion, loculated Chest x-ray 11/06/2018- no interval change in the right-sided pleural effusion Pleurx drainage catheter removed 11/13/2018 4.  History of tobacco use 5.  History of kidney stones 6.  Fever-most likely tumor fever, improved 7.  Admission 10/08/2018 with rapid atrial fibrillation/flutter- improved with diltiazem, started on Eliquis anticoagulation.  Eliquis discontinued.  Now on aspirin. 8.  Renal insufficiency 9.  Admission 08/26/2021 with increased dyspnea,  CT with progressive left chest airspace disease, clinical improvement following antibiotics and steroids 10.  Left leg pain and weakness February 2023        Disposition: Mr. KPaynterhas a history of non-small cell lung cancer.  He is currently maintained off of specific therapy for lung cancer.  He was recently treated with antibiotics and steroids for evidence of pneumonitis.  He is clinical status has partially  improved.  He continues home oxygen therapy.  He developed pain and weakness of the left leg last month.  This has persisted.  I gave him prescription for hydrocodone.  We will refer him for an MRI of the lumbar spine to look for evidence of metastatic cancer or lumbar disc disease.  He will return for an office visit in 2 weeks.  He will call if the hydrocodone does not help the pain.  Betsy Coder, MD  10/21/2021  12:33 PM

## 2021-10-24 DIAGNOSIS — Z20828 Contact with and (suspected) exposure to other viral communicable diseases: Secondary | ICD-10-CM | POA: Diagnosis not present

## 2021-10-25 ENCOUNTER — Telehealth (HOSPITAL_COMMUNITY): Payer: Self-pay | Admitting: *Deleted

## 2021-10-25 ENCOUNTER — Encounter (HOSPITAL_COMMUNITY): Payer: Self-pay | Admitting: *Deleted

## 2021-10-25 NOTE — Telephone Encounter (Signed)
Pt returned call to pulmonary rehab staff.  Pt remains interested in participating.  Advised pt that we have a 2-3 month wait time. Will complete clinical review and forward to support staff for insurance verification and scheduling when able. Cherre Huger, BSN ?Cardiac and Pulmonary Rehab Nurse Navigator  ? ?

## 2021-10-25 NOTE — Progress Notes (Signed)
Received referral from Dr. Elpidio Anis for this pt to participate in pulmonary rehab with the diagnosis of Right sided pleural effusion/Trapped lung. Clinical review of pt follow up appt on 3/6 Pulmonary office note.  Also reviewed oncology notes and admission notes from earlier this year.   Pt with Covid Risk Score - 8. Pt appropriate for scheduling for Pulmonary rehab.  Will forward to support staff for scheduling and verification of insurance eligibility/benefits with pt consent. Cherre Huger, BSN ?Cardiac and Pulmonary Rehab Nurse Navigator  ? ?

## 2021-10-27 DIAGNOSIS — Z20822 Contact with and (suspected) exposure to covid-19: Secondary | ICD-10-CM | POA: Diagnosis not present

## 2021-10-29 ENCOUNTER — Other Ambulatory Visit (HOSPITAL_BASED_OUTPATIENT_CLINIC_OR_DEPARTMENT_OTHER): Payer: Medicare Other

## 2021-11-01 ENCOUNTER — Encounter: Payer: Self-pay | Admitting: Pulmonary Disease

## 2021-11-01 ENCOUNTER — Other Ambulatory Visit: Payer: Self-pay

## 2021-11-01 ENCOUNTER — Ambulatory Visit (HOSPITAL_COMMUNITY)
Admission: RE | Admit: 2021-11-01 | Discharge: 2021-11-01 | Disposition: A | Discharge status: Home / Self Care | Payer: Medicare Other | Source: Ambulatory Visit | Attending: Oncology | Admitting: Oncology

## 2021-11-01 ENCOUNTER — Telehealth: Payer: Self-pay | Admitting: *Deleted

## 2021-11-01 DIAGNOSIS — C3490 Malignant neoplasm of unspecified part of unspecified bronchus or lung: Secondary | ICD-10-CM | POA: Diagnosis not present

## 2021-11-01 DIAGNOSIS — M79605 Pain in left leg: Secondary | ICD-10-CM | POA: Diagnosis not present

## 2021-11-01 MED ORDER — PREDNISONE 10 MG PO TABS: ORAL_TABLET | ORAL | 0 refills | Status: DC

## 2021-11-01 NOTE — Telephone Encounter (Signed)
Mychart message:  ? ?My oxygen setting has gone from 3 to a 4. Even at level 4 having problems breathing. Using albuterol.  ?No relief. Question is do I try using a nebulizer machine to get some relief. ? ?Called and spoke with patient: ? ?States he turned his oxygen level up from 3L to 4L about a week ago due to sob.  Oxygen sats are 95% on 3L.  Increase sob with exertion and eating.  Using Albuterol inhaler every 6 hours with no relief.  Increase mucous production, white foamy.  Has difficulty getting mucous up, once he gets it up he feels better.  He has a flutter valve, encouraged to use it to help get mucous up.  Not on prednisone currently. Finished the taper he was prescribed.   Used the [employer], did not feel it helped.  Advised I would get a message to Dr. Valeta Harms and once we hear back from him we will call him back.  He verbalized understanding. ? ?Dr. Valeta Harms, ?Please advise regarding sob, mucous, albuterol inhaler not helping symptoms and Stiolto sample did not help either.  Thank you. ?

## 2021-11-02 ENCOUNTER — Telehealth: Payer: Self-pay | Admitting: Nurse Practitioner

## 2021-11-02 ENCOUNTER — Other Ambulatory Visit: Payer: Self-pay | Admitting: Nurse Practitioner

## 2021-11-02 DIAGNOSIS — C3490 Malignant neoplasm of unspecified part of unspecified bronchus or lung: Secondary | ICD-10-CM

## 2021-11-02 MED ORDER — DEXAMETHASONE 4 MG PO TABS: 4.0000 mg | ORAL_TABLET | Freq: Four times a day (QID) | ORAL | 0 refills | Status: DC

## 2021-11-02 NOTE — Telephone Encounter (Signed)
I contacted Brian Ramirez regarding the MRI lumbar spine from yesterday.  There is evidence of tumor involving the lumbar spine.  Prescription sent to his pharmacy for Decadron 4 mg every 6 hours.  He will discontinue prednisone.  He denies bowel/bladder dysfunction.  No change in difficulty walking which has been ongoing for over a month.  Pain is some better.  He is scheduled to see [place] tomorrow.  He understands to proceed to the emergency department with increased pain, extremity weakness, bowel/bladder dysfunction. ?

## 2021-11-03 ENCOUNTER — Other Ambulatory Visit: Payer: Self-pay

## 2021-11-03 ENCOUNTER — Ambulatory Visit
Admission: RE | Admit: 2021-11-03 | Discharge: 2021-11-03 | Disposition: A | Discharge status: Home / Self Care | Payer: Medicare Other | Source: Ambulatory Visit | Attending: Radiation Oncology | Admitting: Radiation Oncology

## 2021-11-03 ENCOUNTER — Encounter: Payer: Self-pay | Admitting: Nurse Practitioner

## 2021-11-03 ENCOUNTER — Inpatient Hospital Stay: Payer: Medicare Other | Admitting: Nurse Practitioner

## 2021-11-03 ENCOUNTER — Telehealth: Payer: Self-pay

## 2021-11-03 ENCOUNTER — Ambulatory Visit: Payer: Medicare Other

## 2021-11-03 ENCOUNTER — Institutional Professional Consult (permissible substitution): Payer: Medicare Other | Admitting: Radiation Oncology

## 2021-11-03 ENCOUNTER — Inpatient Hospital Stay (HOSPITAL_BASED_OUTPATIENT_CLINIC_OR_DEPARTMENT_OTHER): Payer: Medicare Other | Admitting: Nurse Practitioner

## 2021-11-03 VITALS — BP 137/78 | HR 87 | Temp 97.8°F | Resp 18 | Ht 73.0 in | Wt 137.0 lb

## 2021-11-03 DIAGNOSIS — C3490 Malignant neoplasm of unspecified part of unspecified bronchus or lung: Secondary | ICD-10-CM | POA: Diagnosis not present

## 2021-11-03 DIAGNOSIS — N289 Disorder of kidney and ureter, unspecified: Secondary | ICD-10-CM | POA: Diagnosis not present

## 2021-11-03 DIAGNOSIS — C3491 Malignant neoplasm of unspecified part of right bronchus or lung: Secondary | ICD-10-CM | POA: Diagnosis not present

## 2021-11-03 DIAGNOSIS — C782 Secondary malignant neoplasm of pleura: Secondary | ICD-10-CM | POA: Diagnosis not present

## 2021-11-03 DIAGNOSIS — C771 Secondary and unspecified malignant neoplasm of intrathoracic lymph nodes: Secondary | ICD-10-CM | POA: Diagnosis not present

## 2021-11-03 DIAGNOSIS — C7951 Secondary malignant neoplasm of bone: Secondary | ICD-10-CM | POA: Diagnosis not present

## 2021-11-03 DIAGNOSIS — M899 Disorder of bone, unspecified: Secondary | ICD-10-CM | POA: Diagnosis not present

## 2021-11-03 DIAGNOSIS — C3410 Malignant neoplasm of upper lobe, unspecified bronchus or lung: Secondary | ICD-10-CM | POA: Diagnosis not present

## 2021-11-03 DIAGNOSIS — J90 Pleural effusion, not elsewhere classified: Secondary | ICD-10-CM | POA: Diagnosis not present

## 2021-11-03 NOTE — Progress Notes (Signed)
?Radiation Oncology         [phone] ?________________________________ ? ? ?Initial Outpatient Consultation - Conducted via telephone due to current COVID-19 concerns for limiting patient exposure ? ?Name: Brian Ramirez MRN: [id number]  ?Date of Service: 11/03/2021 DOB: Dec 30, 1945 ? ?YN:WGNFAOZ, Dibas, MD  Ladell Pier, MD  ? ?REFERRING PHYSICIAN: Ladell Pier, MD ? ?DIAGNOSIS: 76 year old male with painful bony metastatic disease in the spine involving L4-L5, secondary to metastatic NSCLC. ? ?  ICD-10-CM   ?1. Malignant neoplasm of bronchus and lung (HCC)  C34.90   ?  ?2. Non-small cell lung cancer metastatic to bone [facility])  C34.90   ? C79.51   ?  ? ? ?HISTORY OF PRESENT ILLNESS: Brian Ramirez is a 76 y.o. male seen at the request of Dr. Dr. [name].  He was initially diagnosed with poorly differentiated non-small cell lung cancer favoring adenocarcinoma, on CT-guided biopsy of a pleural-based mass on 09/07/2018.  He has had recurrent pleural effusions and had a Pleurx catheter placed on 09/11/2018.  CT imaging on admission at that time confirmed metastatic disease involving bilateral lungs, hilar and mediastinal lymph nodes and right pleura.  He was initially treated with Alimta/carboplatin/pembrolizumab x4 cycles beginning 09/28/2018 and was noted to have a good response to treatment on follow-up imaging.  He was changed to single agent maintenance immunotherapy with Keytruda on 12/19/2018 and his disease remained stable so he was able to discontinue the immunotherapy treatment as of 09/14/2020.  He resumed single agent Keytruda on 07/30/2021 due to evidence of progressive airspace disease and nodules in the left lower lobe.  He was scheduled for repeat bronchoscopy with biopsy and thoracentesis on 08/31/2021 but was readmitted to the hospital on 08/26/2021 due to worsening dyspnea.  He was treated for pneumonia with antibiotics and steroids and did have improvement in his symptoms.  He was able to  undergo a bronchoscopy biopsy on 08/30/2021 with tissue sampling of the disease in the left upper lobe.  Pathology was negative for malignancy and felt likely pneumonitis associated with his Keytruda immunotherapy so his treatment has been held and his symptoms have improved with steroids which he has recently tapered off of.  He developed pain and weakness in the left leg beginning in February 2023 and progressively worsened, now with left foot drop.  He does have sensation on the the top of the foot but is unable to dorsiflex or plantar-flex the foot. He also reports pain in the left flank into the buttocks, especially when lying down and trying to sleep at night. He had an MRI of the lumbar spine on 11/01/2021 for further evaluation of the low back pain and leg weakness and this showed a large, 5 cm tumor extending from the posterior L4 vertebral body into the spinal canal resulting in severe stenosis at the L4-L5 level.  Additionally, he was noted to have a 2.5 cm mass in the medial left psoas muscle at the L4 level with surrounding edema. ? ? ? ?We have been asked to consult today for discussion of palliative radiotherapy in the management of his painful bony metastatic disease in the spine involving L4-L5. ? ?PREVIOUS RADIATION THERAPY: No ? ?PAST MEDICAL HISTORY:  ?Past Medical History:  ?Diagnosis Date  ? Acute congestive heart failure ([place])   ? Anemia of chronic disease 10/08/2018  ? Anticoagulated   ? Atrial flutter with rapid ventricular response ([place]) 10/08/2018  ? CAD in native artery 06/01/2021  ? Difficult airway for intubation  09/26/2016  ? Difficult intubation   ? Jaw only opens up half way to intubate.  ? Edema of both lower extremities 10/09/2018  ? Essential hypertension 06/01/2021  ? Exertional dyspnea 06/01/2021  ? Goals of care, counseling/discussion 09/20/2018  ? Hernia 2012  ? History of hiatal hernia   ? History of kidney stones   ? Left inguinal hernia s/p lap repair 11/22/2016 09/26/2016  ?  Lung cancer ([place]) 09/20/2018  ? Lung mass 09/04/2018  ? Malnutrition of moderate degree 09/20/2018  ? Mass of right lung 09/04/2018  ? New onset atrial fibrillation ([place])   ? Personal history of kidney stones 1992  ? Pleural effusion 09/04/2018  ? Pleural effusion on right   ? Pressure injury of skin 09/20/2018  ? SOB (shortness of breath) 09/19/2018  ?   ? ?PAST SURGICAL HISTORY: ?Past Surgical History:  ?Procedure Laterality Date  ? BRONCHIAL BIOPSY  08/30/2021  ? Procedure: BRONCHIAL BIOPSIES;  Surgeon: Garner Nash, DO;  Location: [place];  Service: Pulmonary;;  ? BRONCHIAL NEEDLE ASPIRATION BIOPSY  08/30/2021  ? Procedure: BRONCHIAL NEEDLE ASPIRATION BIOPSIES;  Surgeon: Garner Nash, DO;  Location: [place];  Service: Pulmonary;;  ? BRONCHIAL WASHINGS  08/30/2021  ? Procedure: BRONCHIAL WASHINGS;  Surgeon: Garner Nash, DO;  Location: [facility];  Service: Pulmonary;;  ? HERNIA REPAIR  2012  ? inguinal  ? INGUINAL HERNIA REPAIR Left 11/22/2016  ? Procedure: LAPAROSCOPIC  REPAIR OF LEFT INGUINAL HERNIA WITH MESH;  Surgeon: [name], MD;  Location: [facility];  Service: General;  Laterality: Left;  ? IR INSTILL VIA CHEST TUBE AGENT FOR FIBRINOLYSIS INI DAY  10/10/2018  ? IR INSTILL VIA CHEST TUBE AGENT FOR FIBRINOLYSIS INI DAY  10/31/2018  ? IR PERC PLEURAL DRAIN W/INDWELL CATH W/IMG GUIDE  09/11/2018  ? IR REMOVAL OF PLURAL CATH W/CUFF  11/13/2018  ? IR THORACENTESIS ASP PLEURAL SPACE W/IMG GUIDE  07/06/2021  ? ? ?FAMILY HISTORY:  ?Family History  ?Problem Relation Age of Onset  ? Heart disease Father   ? Cancer Brother   ? ? ?SOCIAL HISTORY:  ?Social History  ? ?Socioeconomic History  ? Marital status: Single  ?  Spouse name: Not on file  ? Number of children: Not on file  ? Years of education: Not on file  ? Highest education level: Not on file  ?Occupational History  ? Not on file  ?Tobacco Use  ? Smoking status: Former  ?  Packs/day: 1.00  ?  Years: 30.00  ?  Pack years: 30.00  ?  Types:  Cigarettes  ?  Quit date: 1998  ?  Years since quitting: 25.2  ? Smokeless tobacco: Never  ?Vaping Use  ? Vaping Use: Never used  ?Substance and Sexual Activity  ? Alcohol use: Yes  ?  Alcohol/week: 1.0 standard drink  ?  Types: 1 Glasses of wine per week  ? Drug use: No  ? Sexual activity: Not on file  ?Other Topics Concern  ? Not on file  ?Social History Narrative  ? Not on file  ? ?Social Determinants of Health  ? ?Financial Resource Strain: Not on file  ?Food Insecurity: Not on file  ?Transportation Needs: Not on file  ?Physical Activity: Not on file  ?Stress: Not on file  ?Social Connections: Not on file  ?Intimate Partner Violence: Not on file  ? ? ?ALLERGIES: Patient has no known allergies. ? ?MEDICATIONS:  ?Current Outpatient Medications  ?Medication Sig Dispense Refill  ?  albuterol (VENTOLIN HFA) 108 (90 Base) MCG/ACT inhaler Inhale 2 puffs into the lungs every 6 (six) hours as needed for wheezing or shortness of breath. 18 g 2  ? Artificial Tear Ointment (DRY EYES OP) Apply 1 drop to eye as needed (Dry, itching eyes).    ? Artificial Tear Ointment (DRY EYES OP)     ? aspirin EC 81 MG tablet Take 1 tablet (81 mg total) by mouth daily. 90 tablet 3  ? dexamethasone (DECADRON) 4 MG tablet Take 1 tablet (4 mg total) by mouth every 6 (six) hours. 40 tablet 0  ? feeding supplement (ENSURE ENLIVE / ENSURE PLUS) LIQD Take 237 mLs by mouth 3 (three) times daily between meals. 237 mL 12  ? gabapentin (NEURONTIN) 300 MG capsule Take by mouth 3 (three) times daily. For 7 days started 10/18/21    ? HYDROcodone-acetaminophen (NORCO) 5-325 MG tablet Take 1 tablet by mouth every 6 (six) hours as needed for moderate pain. 60 tablet 0  ? hydrocortisone cream (CORTIZONE 10 PLUS) 1 % 1 application    ? Tiotropium Bromide-Olodaterol (STIOLTO RESPIMAT) 2.5-2.5 MCG/ACT AERS Inhale 2 puffs into the lungs daily. 4 g 0  ? Tiotropium Bromide-Olodaterol (STIOLTO RESPIMAT) 2.5-2.5 MCG/ACT AERS Inhale 2 puffs into the lungs daily. 4 g 0   ? ?No current facility-administered medications for this encounter.  ? ? ?REVIEW OF SYSTEMS:  On review of systems, the patient reports that he is doing well overall.  He denies any chest pain, shortness of

## 2021-11-03 NOTE — Telephone Encounter (Signed)
-----   Message from Owens Shark, NP sent at 11/03/2021  7:54 AM EDT ----- ?Please call him---Dr S would like for him to come in at 1230 today ? ?

## 2021-11-03 NOTE — Telephone Encounter (Signed)
Called and spoke with patient who states that he picked up the prednisone prescription and started it but then his Oncologist switched him and put him on Dexamethasone so he has stopped taking the prednisone. Nothing further needed at this time.  ?

## 2021-11-03 NOTE — Progress Notes (Signed)
Histology and Location of Primary Cancer: Metastatic Non cell lung cancer ? ?Sites of Visceral and Bony Metastatic Disease: L4 & L5 ? ?Location(s) of Symptomatic Metastases: L4 & L5 ? ?Past/Anticipated chemotherapy by medical oncology, if any:   ? ?Pain on a scale of 0-10 is:  10/10 left hip down to foot. ? ? ?If Spine Met(s), symptoms, if any, include: ?Bowel/Bladder retention or incontinence (please describe): No ?Numbness or weakness in extremities (please describe): weakness left leg left numb dropped foot ?Current Decadron regimen, if applicable: Decadron 4 mg take 1 tablet by mouth every six hours. ? ?Ambulatory status? Walker? Wheelchair?: Cane ? ?SAFETY ISSUES: ?Prior radiation? No ?Pacemaker/ICD? No ?Possible current pregnancy? Male ?Is the patient on methotrexate? No ? ?Current Complaints / other details:  Needs transportation to appointments. ? ? ?

## 2021-11-03 NOTE — Progress Notes (Signed)
?[facility] ?OFFICE PROGRESS NOTE ? ? ?Diagnosis: Non-small cell lung cancer ? ?INTERVAL HISTORY:  ? ?Brian Ramirez returns for follow-up.  He has pain beginning at the left buttock and extending to the left foot.  The foot is numb.  The leg feels weak.  No bowel or bladder dysfunction.  He feels breathing is getting worse. ? ?Objective: ? ?Vital signs in last 24 hours: ? ?Blood pressure 137/78, pulse 87, temperature 97.8 ?F (36.6 ?C), temperature source Oral, resp. rate 18, height 6' 1" (1.854 m), weight 137 lb (62.1 kg), SpO2 95 %. ?  ? ?Resp: Diminished breath sounds about the right lung field.  No respiratory distress. ?Cardio: Irregular. ?GI: No hepatosplenomegaly. ?Vascular: No leg edema. ?Neuro: Weakness at the left foot, mild weakness left upper leg. ? ? ?Lab Results: ? ?Lab Results  ?Component Value Date  ? WBC 13.4 (H) 08/31/2021  ? HGB 10.4 (L) 08/31/2021  ? HCT 34.5 (L) 08/31/2021  ? MCV 91.3 08/31/2021  ? PLT 330 08/31/2021  ? NEUTROABS 12.2 (H) 08/31/2021  ? ? ?Imaging: ? ?No results found. ? ?Medications: I have reviewed the patient's current medications. ? ?Assessment/Plan: ?Metastatic non-small cell lung cancer ? large right pleural effusion, right lung mass, right pleural-based masses, left lung nodules ?Right thoracentesis 09/04/2018-negative cytology ?CT biopsy of right lateral pleural mass 09/07/2018- poorly differentiated non-small cell carcinoma, malignant cells positive for cytokeratin AE1/AE3 and cytokeratin 8/18, histology favoring adenocarcinoma, MSS, tumor mutation burden 5.no EGFR, ALK, BRAF alteration.  PDL 1 rearrangement ?PDL 1 tumor proportion score-100%  ?Cycle 1 Alimta/carboplatin 09/22/2018, pembrolizumab 09/28/2018 ?Cycle 2 Alimta/carboplatin/pembrolizumab 10/15/2018 ?Cycle 3 Alimta/carboplatin/pembrolizumab 11/06/2018 ?Cycle 4 Alimta/carboplatin/pembrolizumab 11/28/2018 ?CT chest 12/14/2018- response to therapy of lung primary/primaries, thoracic nodal and pulmonary  metastasis.  Decrease in right pleural effusion with improvement in pleural-based metastasis.  New T10 heterogeneous sclerotic density likely due to healing of previously CT occult metastasis. ?Pembrolizumab 12/19/2018 ?Pembrolizumab/Alimta starting 01/09/2019 ?Pembrolizumab/Alimta 01/31/2019 ?Pembrolizumab/Alimta 02/21/2019 ?Pembrolizumab/Alimta 03/14/2019 ?CT 04/01/2019- no evidence of disease progression, small loculated right pleural effusion and right basilar pleural nodularity-stable, stable left lower lobe nodule, persistent sclerosis at T10 ?Pembrolizumab/Alimta 04/05/2019 ?Pembrolizumab 05/07/2019 ?Pembrolizumab 05/28/2019 ?Pembrolizumab 06/18/2019 ?Pembrolizumab 07/09/2019 ?Chest CT 07/26/2019-moderate to large stable exudative right effusion with considerable pleural thickening.  Nodularity at the right lung base along pleural thickening similar to prior.  Stable left paramediastinal groundglass density nodule.  Stable sclerotic lesion posteriorly at the T10 vertebral level. ?Pembrolizumab 07/30/2019 ?Pembrolizumab 08/21/2019-changed to every 6-week dosing ?Pembrolizumab 10/11/2019 ?Pembrolizumab 11/22/2019 ?CT chest 01/03/2020-chronic appearing loculated effusion right chest with signs of pleural thickening smaller than on prior exam; similar appearance of tree-in-bud bud nodularity in the right upper lobe and left lung apex along with bronchiectatic changes in the lingula and middle lobe; similar appearance of sclerosis T10 vertebral body ?Pembrolizumab 01/03/2020 ?Pembrolizumab 02/14/2020 ?Pembrolizumab 03/27/2020 ?Chest CT 05/06/2020-unchanged posttreatment appearance of the chest with a chronic, loculated moderate right pleural effusion with associated atelectasis or consolidation and pleural thickening.  Stable small pulmonary nodules measuring 3 mm or smaller.  ?Pembrolizumab 05/08/2020 ?Pembrolizumab 06/19/2020 ?Pembrolizumab 08/03/2020 ?Pembrolizumab 09/14/2020 ?CT chest 11/04/2020-persistent and enlarging complex  loculated right pleural fluid collection.  No findings suspicious for recurrent tumor, mediastinal/hilar adenopathy or pulmonary metastatic disease. ?CT chest 02/04/2021-chronic loculated right pleural effusion, new ill-defined right upper lobe nodule-nonspecific, coronary and aortic valve calcifications  (next scan planned at a 51-monthinterval) ?CT chest 05/21/2021-substantial increase peripheral airspace opacity in the right upper lobe and superior segment right lower lobe.  Appearance suspicious  for multi lobar pneumonia.  Stable thick-walled chronic large right pleural effusion.  New part solid 1.2 cm superior segment left lower lobe nodule.  Stable left upper lobe groundglass density pulmonary nodule.  Increased bandlike nodular opacity laterally in the left lower lobe. ?10-day course of Levaquin   ?Chest x-ray 06/09/2021-new patchy airspace disease left upper lobe, right upper lobe airspace disease has minimally decreased but not resolved.  Stable loculated right pleural effusion ?Thoracentesis 07/06/2021-350 cc of thick bloody fluid removed.  Cytology with atypical cells present.  Culture with no growth. ?CT chest 07/26/2021-progressive ill-defined nodular airspace process in the left lower lobe with hazy surrounding groundglass opacity.  Significant progression of ill-defined groundglass opacity and slight interstitial thickening left upper lobe.  Improved appearance right upper lobe.  Stable thick-walled loculated right pleural fluid collection.  New small left pleural effusion.  Stable underlying lung disease.  No mediastinal or hilar mass or adenopathy. ?Pembrolizumab 07/30/2021 ?Pembrolizumab 08/20/2021 ?CT chest 08/26/2021-new left upper lobe airspace disease with interlobar septal thickening, new airspace disease in the superior segment of the left lower lobe, minimal patchy groundglass opacities in the right upper lobe, increased left pleural effusion, new prominent prevascular node ?Antibiotics and  steroid taper with clinical improvement ?Bronchoscopy with biopsies and bronchoalveolar lavage from the left upper lobe 08/30/2021-culture negative, transbronchial biopsy and brushing negative for malignancy ?MRI lumbar spine 11/01/2021-5.1 cm tumor extending from the posterior L4 body into the spinal canal and caudally infiltrating the posterior L4-L5 disc and L5 superior endplate.  Subsequent severe malignant spinal lateral recess and left foraminal stenosis at L4-L5.  Query left L4 and/or L5 radiculitis.  Noncontiguous indeterminate 2.5 cm mass within the medial left psoas muscle at the L4 level with mild surrounding muscle edema. ?  ?2.  Severe anemia-likely secondary to bleeding in the right pleural space and metastatic carcinoma, red cell transfusions 09/09/2018 09/10/2018, 09/23/2018-improved; progressive 04/26/2019, red cell transfusion.  Improved. ?3.  Dyspnea secondary to the large right pleural effusion and anemia ?Right Pleurx catheter placed 09/11/2018 ?Chest x-ray 10/09/2018- large right pleural effusion, loculated ?Chest x-ray 11/06/2018- no interval change in the right-sided pleural effusion ?Pleurx drainage catheter removed 11/13/2018 ?4.  History of tobacco use ?5.  History of kidney stones ?6.  Fever-most likely tumor fever, improved ?7.  Admission 10/08/2018 with rapid atrial fibrillation/flutter- improved with diltiazem, started on Eliquis anticoagulation.  Eliquis discontinued.  Now on aspirin. ?8.  Renal insufficiency ?9.  Admission 08/26/2021 with increased dyspnea, CT with progressive left chest airspace disease, clinical improvement following antibiotics and steroids ?10.  Left leg pain and weakness February 2023 ?  ? ?Disposition: Brian Ramirez has non-small cell lung cancer, currently off of specific therapy.  He recently developed pain and weakness of the left leg.  MRI 11/09/2021 shows tumor involving the lumbar spine, also noted is a 2.5 cm mass within the left psoas muscle.  We contacted him  yesterday with the result.  He was started on Decadron 4 mg every 6 hours.  We reviewed the results/images with him at today's visit and made a referral to radiation oncology.  He has an appointment with Dr. Tammi Klippel this after

## 2021-11-03 NOTE — Telephone Encounter (Signed)
Patient gave verbal understanding and denied further questions ?

## 2021-11-03 NOTE — Telephone Encounter (Signed)
Message from mychart:  response from Dr. Valeta Harms: ? ?I would restart prednisone if that was making him feel better  ?Prednisone taper 40mg  X4 days, decrease by 10mg  every 4 days  ? ?Thanks  ? ?BLI  ? ?Garner Nash, DO  ?[facility] ? ?Called and left patient a VM and sent a message in [place] as well. ? ?

## 2021-11-04 ENCOUNTER — Ambulatory Visit
Admission: RE | Admit: 2021-11-04 | Discharge: 2021-11-04 | Disposition: A | Discharge status: Home / Self Care | Payer: Medicare Other | Source: Ambulatory Visit | Attending: Radiation Oncology | Admitting: Radiation Oncology

## 2021-11-04 ENCOUNTER — Encounter: Payer: Self-pay | Admitting: Oncology

## 2021-11-04 ENCOUNTER — Telehealth: Payer: Self-pay

## 2021-11-04 DIAGNOSIS — C3490 Malignant neoplasm of unspecified part of unspecified bronchus or lung: Secondary | ICD-10-CM | POA: Diagnosis not present

## 2021-11-04 DIAGNOSIS — C7951 Secondary malignant neoplasm of bone: Secondary | ICD-10-CM | POA: Diagnosis not present

## 2021-11-04 DIAGNOSIS — Z510 Encounter for antineoplastic radiation therapy: Secondary | ICD-10-CM | POA: Diagnosis not present

## 2021-11-04 NOTE — Telephone Encounter (Signed)
Printed out an appointment reminder and mailed it out for his PET on 11/25/21 at [facility]. ?

## 2021-11-04 NOTE — Patient Outreach (Signed)
Received a Nurse call center notification . ?I have assigned Deloria Lair, NP to call for follow up and determine if there are any Case Management needs.  ?  ?Arville Care, CBCS, CMAA ?[place] [place] Management Assistant ?[facility] Management ?[phone]   ?

## 2021-11-04 NOTE — Progress Notes (Signed)
?  Radiation Oncology         [phone] ?________________________________ ? ?Name: Brian Ramirez MRN: [id number]  ?Date: 11/04/2021  DOB: 10-20-1945 ? ?SIMULATION AND TREATMENT PLANNING NOTE ? ?  ICD-10-CM   ?1. Non-small cell lung cancer metastatic to bone (HCC)  C34.90   ? C79.51   ?  ? ? ?DIAGNOSIS:  76 y.o. patient with L4 L5 lumbar metastasis and psoas metastasis ? ?NARRATIVE:  The patient was brought to the [facility].  Identity was confirmed.  All relevant records and images related to the planned course of therapy were reviewed.  The patient freely provided informed written consent to proceed with treatment after reviewing the details related to the planned course of therapy. The consent form was witnessed and verified by the simulation staff.  Then, the patient was set-up in a stable reproducible  supine position for radiation therapy.  CT images were obtained.  Surface markings were placed.  The CT images were loaded into the planning software.  Then the target and avoidance structures were contoured including kidneys.  Treatment planning then occurred.  The radiation prescription was entered and confirmed.  Then, I designed and supervised the construction of a total of 3 medically necessary complex treatment devices with VacLoc positioner and 2 MLCs to shield kidneys.  I have requested : 3D Simulation  I have requested a DVH of the following structures: Left Kidney, Right Kidney and target. ? ?PLAN:  The patient will receive 30 Gy in 10 fractions. ? ?________________________________ ? ?Sheral Apley Tammi Klippel, M.D. ? ?

## 2021-11-05 ENCOUNTER — Other Ambulatory Visit: Payer: Self-pay | Admitting: *Deleted

## 2021-11-05 NOTE — Patient Outreach (Signed)
[facility]) Care Management ? ?11/04/21 ? ?Brian Ramirez ?31-Dec-1945 ?[id number] ? ?Telephone outreach to [name] after receiving report from our [place]. Pt reporting L hip to foot pain and increasing SOB. Called pt and did not get an answer. Left a message that he could call me back or if his sxs were severe he should follow the previous advice from the nurseline to go to the ED.  ? ?Messaged Dr. Tammi Klippel and he replied that Dr. [name] is pt's oncologist. Messaged Dr. [name] and he responded that pt is on pain medication, oxygen and if he is not doing well he should call the office. ? ?Brian Pont. Aseneth Hack, MSN, GNP-BC ?Gerontological Nurse Practitioner ?[facility] Care Management ?[phone] ? ? ?

## 2021-11-05 NOTE — Patient Outreach (Signed)
[facility]) Care Management ? ?11/05/2021 ? ?Azucena Kuba ?1945/08/27 ?[id number] ? ?Follow up call to Mr. Rathke to check on his status and offer support. Left a message and provided NP number if he would like to call back. ? ?Eulah Pont. Ferris Tally, MSN, GNP-BC ?Gerontological Nurse Practitioner ?[facility] Care Management ?[phone] ? ? ?

## 2021-11-05 NOTE — Telephone Encounter (Signed)
See telephone note.

## 2021-11-08 ENCOUNTER — Other Ambulatory Visit: Payer: Self-pay

## 2021-11-08 ENCOUNTER — Ambulatory Visit
Admission: RE | Admit: 2021-11-08 | Discharge: 2021-11-08 | Disposition: A | Discharge status: Home / Self Care | Payer: Medicare Other | Source: Ambulatory Visit | Attending: Radiation Oncology | Admitting: Radiation Oncology

## 2021-11-08 DIAGNOSIS — Z510 Encounter for antineoplastic radiation therapy: Secondary | ICD-10-CM | POA: Diagnosis not present

## 2021-11-08 DIAGNOSIS — C3490 Malignant neoplasm of unspecified part of unspecified bronchus or lung: Secondary | ICD-10-CM | POA: Diagnosis not present

## 2021-11-08 DIAGNOSIS — C7951 Secondary malignant neoplasm of bone: Secondary | ICD-10-CM | POA: Diagnosis not present

## 2021-11-09 ENCOUNTER — Ambulatory Visit
Admission: RE | Admit: 2021-11-09 | Discharge: 2021-11-09 | Disposition: A | Discharge status: Home / Self Care | Payer: Medicare Other | Source: Ambulatory Visit | Attending: Radiation Oncology | Admitting: Radiation Oncology

## 2021-11-09 DIAGNOSIS — Z510 Encounter for antineoplastic radiation therapy: Secondary | ICD-10-CM | POA: Diagnosis not present

## 2021-11-09 DIAGNOSIS — C3490 Malignant neoplasm of unspecified part of unspecified bronchus or lung: Secondary | ICD-10-CM | POA: Diagnosis not present

## 2021-11-09 DIAGNOSIS — C7951 Secondary malignant neoplasm of bone: Secondary | ICD-10-CM | POA: Diagnosis not present

## 2021-11-10 ENCOUNTER — Ambulatory Visit (HOSPITAL_COMMUNITY): Payer: Medicare Other

## 2021-11-10 ENCOUNTER — Ambulatory Visit
Admission: RE | Admit: 2021-11-10 | Discharge: 2021-11-10 | Disposition: A | Discharge status: Home / Self Care | Payer: Medicare Other | Source: Ambulatory Visit | Attending: Radiation Oncology | Admitting: Radiation Oncology

## 2021-11-10 ENCOUNTER — Other Ambulatory Visit: Payer: Self-pay

## 2021-11-10 ENCOUNTER — Telehealth: Payer: Self-pay | Admitting: Dietician

## 2021-11-10 DIAGNOSIS — Z510 Encounter for antineoplastic radiation therapy: Secondary | ICD-10-CM | POA: Diagnosis not present

## 2021-11-10 DIAGNOSIS — C3490 Malignant neoplasm of unspecified part of unspecified bronchus or lung: Secondary | ICD-10-CM | POA: Diagnosis not present

## 2021-11-10 DIAGNOSIS — C7951 Secondary malignant neoplasm of bone: Secondary | ICD-10-CM | POA: Diagnosis not present

## 2021-11-10 NOTE — Telephone Encounter (Signed)
Patient screened on MST. First attempt to reach. He answered the phone but said he didn't have time to talk. Provided my office and cell# by text to return call to set up a nutrition consult. ? ?April Manson, RDN, LDN ?Registered Dietitian, [place] ?Part Time Remote (Usual office hours: Tuesday-Thursday) ?Cell: [phone]   ?

## 2021-11-11 ENCOUNTER — Ambulatory Visit
Admission: RE | Admit: 2021-11-11 | Discharge: 2021-11-11 | Disposition: A | Discharge status: Home / Self Care | Payer: Medicare Other | Source: Ambulatory Visit | Attending: Radiation Oncology | Admitting: Radiation Oncology

## 2021-11-11 DIAGNOSIS — Z510 Encounter for antineoplastic radiation therapy: Secondary | ICD-10-CM | POA: Diagnosis not present

## 2021-11-11 DIAGNOSIS — C7951 Secondary malignant neoplasm of bone: Secondary | ICD-10-CM | POA: Diagnosis not present

## 2021-11-11 DIAGNOSIS — C3490 Malignant neoplasm of unspecified part of unspecified bronchus or lung: Secondary | ICD-10-CM | POA: Diagnosis not present

## 2021-11-12 ENCOUNTER — Other Ambulatory Visit: Payer: Self-pay

## 2021-11-12 ENCOUNTER — Ambulatory Visit
Admission: RE | Admit: 2021-11-12 | Discharge: 2021-11-12 | Disposition: A | Discharge status: Home / Self Care | Payer: Medicare Other | Source: Ambulatory Visit | Attending: Radiation Oncology | Admitting: Radiation Oncology

## 2021-11-12 DIAGNOSIS — C3490 Malignant neoplasm of unspecified part of unspecified bronchus or lung: Secondary | ICD-10-CM | POA: Diagnosis not present

## 2021-11-12 DIAGNOSIS — Z510 Encounter for antineoplastic radiation therapy: Secondary | ICD-10-CM | POA: Diagnosis not present

## 2021-11-12 DIAGNOSIS — C7951 Secondary malignant neoplasm of bone: Secondary | ICD-10-CM | POA: Diagnosis not present

## 2021-11-15 ENCOUNTER — Other Ambulatory Visit: Payer: Self-pay

## 2021-11-15 ENCOUNTER — Ambulatory Visit
Admission: RE | Admit: 2021-11-15 | Discharge: 2021-11-15 | Disposition: A | Discharge status: Home / Self Care | Payer: Medicare Other | Source: Ambulatory Visit | Attending: Radiation Oncology | Admitting: Radiation Oncology

## 2021-11-15 DIAGNOSIS — C7951 Secondary malignant neoplasm of bone: Secondary | ICD-10-CM | POA: Insufficient documentation

## 2021-11-15 DIAGNOSIS — C3490 Malignant neoplasm of unspecified part of unspecified bronchus or lung: Secondary | ICD-10-CM | POA: Diagnosis not present

## 2021-11-15 DIAGNOSIS — Z510 Encounter for antineoplastic radiation therapy: Secondary | ICD-10-CM | POA: Diagnosis not present

## 2021-11-16 ENCOUNTER — Ambulatory Visit
Admission: RE | Admit: 2021-11-16 | Discharge: 2021-11-16 | Disposition: A | Discharge status: Home / Self Care | Payer: Medicare Other | Source: Ambulatory Visit | Attending: Radiation Oncology | Admitting: Radiation Oncology

## 2021-11-16 DIAGNOSIS — Z510 Encounter for antineoplastic radiation therapy: Secondary | ICD-10-CM | POA: Diagnosis not present

## 2021-11-16 DIAGNOSIS — C7951 Secondary malignant neoplasm of bone: Secondary | ICD-10-CM | POA: Diagnosis not present

## 2021-11-16 DIAGNOSIS — C3490 Malignant neoplasm of unspecified part of unspecified bronchus or lung: Secondary | ICD-10-CM | POA: Diagnosis not present

## 2021-11-17 ENCOUNTER — Ambulatory Visit
Admission: RE | Admit: 2021-11-17 | Discharge: 2021-11-17 | Disposition: A | Discharge status: Home / Self Care | Payer: Medicare Other | Source: Ambulatory Visit | Attending: Radiation Oncology | Admitting: Radiation Oncology

## 2021-11-17 DIAGNOSIS — C3490 Malignant neoplasm of unspecified part of unspecified bronchus or lung: Secondary | ICD-10-CM | POA: Diagnosis not present

## 2021-11-17 DIAGNOSIS — Z510 Encounter for antineoplastic radiation therapy: Secondary | ICD-10-CM | POA: Diagnosis not present

## 2021-11-17 DIAGNOSIS — C7951 Secondary malignant neoplasm of bone: Secondary | ICD-10-CM | POA: Diagnosis not present

## 2021-11-18 ENCOUNTER — Ambulatory Visit
Admission: RE | Admit: 2021-11-18 | Discharge: 2021-11-18 | Disposition: A | Discharge status: Home / Self Care | Payer: Medicare Other | Source: Ambulatory Visit | Attending: Radiation Oncology | Admitting: Radiation Oncology

## 2021-11-18 ENCOUNTER — Encounter: Payer: Self-pay | Admitting: Nurse Practitioner

## 2021-11-18 ENCOUNTER — Inpatient Hospital Stay: Payer: Medicare Other | Attending: Nurse Practitioner | Admitting: Nurse Practitioner

## 2021-11-18 ENCOUNTER — Other Ambulatory Visit: Payer: Self-pay

## 2021-11-18 ENCOUNTER — Encounter (HOSPITAL_COMMUNITY): Payer: Self-pay

## 2021-11-18 VITALS — BP 116/75 | HR 100 | Temp 98.1°F | Resp 20 | Ht 73.0 in | Wt 133.5 lb

## 2021-11-18 DIAGNOSIS — Z79899 Other long term (current) drug therapy: Secondary | ICD-10-CM | POA: Insufficient documentation

## 2021-11-18 DIAGNOSIS — C7951 Secondary malignant neoplasm of bone: Secondary | ICD-10-CM | POA: Diagnosis not present

## 2021-11-18 DIAGNOSIS — Z5112 Encounter for antineoplastic immunotherapy: Secondary | ICD-10-CM | POA: Insufficient documentation

## 2021-11-18 DIAGNOSIS — C3490 Malignant neoplasm of unspecified part of unspecified bronchus or lung: Secondary | ICD-10-CM

## 2021-11-18 DIAGNOSIS — Z5189 Encounter for other specified aftercare: Secondary | ICD-10-CM | POA: Insufficient documentation

## 2021-11-18 DIAGNOSIS — C3491 Malignant neoplasm of unspecified part of right bronchus or lung: Secondary | ICD-10-CM | POA: Insufficient documentation

## 2021-11-18 DIAGNOSIS — Z20822 Contact with and (suspected) exposure to covid-19: Secondary | ICD-10-CM | POA: Diagnosis not present

## 2021-11-18 DIAGNOSIS — C782 Secondary malignant neoplasm of pleura: Secondary | ICD-10-CM | POA: Insufficient documentation

## 2021-11-18 DIAGNOSIS — Z510 Encounter for antineoplastic radiation therapy: Secondary | ICD-10-CM | POA: Diagnosis not present

## 2021-11-18 NOTE — Progress Notes (Signed)
?[name] ?OFFICE PROGRESS NOTE ? ? ?Diagnosis: Non-small cell lung cancer ? ?INTERVAL HISTORY:  ? ?Brian Ramirez returns as scheduled.  Back/right leg pain better.  He is more short of breath.  Utilizing supplemental oxygen at 3 to 4 L.  At times the dyspnea occurs at rest.  Main complaint is fatigue. ? ?Objective: ? ?Vital signs in last 24 hours: ? ?Blood pressure 116/75, pulse 100, temperature 98.1 ?F (36.7 ?C), temperature source Oral, resp. rate 20, height _0  (1.854 m), weight 133 lb 8 oz (60.6 kg), SpO2 96 %. ?  ? ?Resp: Diminished breath sounds right lung field.  No respiratory distress. ?Cardio: Regular rate and rhythm. ?GI: No hepatomegaly. ?Vascular: No leg edema. ? ?Lab Results: ? ?Lab Results  ?Component Value Date  ? WBC 13.4 (H) 08/31/2021  ? HGB 10.4 (L) 08/31/2021  ? HCT 34.5 (L) 08/31/2021  ? MCV 91.3 08/31/2021  ? PLT 330 08/31/2021  ? NEUTROABS 12.2 (H) 08/31/2021  ? ? ?Imaging: ? ?No results found. ? ?Medications: I have reviewed the patient's current medications. ? ?Assessment/Plan: ?Metastatic non-small cell lung cancer ? large right pleural effusion, right lung mass, right pleural-based masses, left lung nodules ?Right thoracentesis 09/04/2018-negative cytology ?CT biopsy of right lateral pleural mass 09/07/2018- poorly differentiated non-small cell carcinoma, malignant cells positive for cytokeratin AE1/AE3 and cytokeratin 8/18, histology favoring adenocarcinoma, MSS, tumor mutation burden 5.no EGFR, ALK, BRAF alteration.  PDL 1 rearrangement ?PDL 1 tumor proportion score-100%  ?Cycle 1 Alimta/carboplatin 09/22/2018, pembrolizumab 09/28/2018 ?Cycle 2 Alimta/carboplatin/pembrolizumab 10/15/2018 ?Cycle 3 Alimta/carboplatin/pembrolizumab 11/06/2018 ?Cycle 4 Alimta/carboplatin/pembrolizumab 11/28/2018 ?CT chest 12/14/2018- response to therapy of lung primary/primaries, thoracic nodal and pulmonary metastasis.  Decrease in right pleural effusion with improvement in pleural-based  metastasis.  New T10 heterogeneous sclerotic density likely due to healing of previously CT occult metastasis. ?Pembrolizumab 12/19/2018 ?Pembrolizumab/Alimta starting 01/09/2019 ?Pembrolizumab/Alimta 01/31/2019 ?Pembrolizumab/Alimta 02/21/2019 ?Pembrolizumab/Alimta 03/14/2019 ?CT 04/01/2019- no evidence of disease progression, small loculated right pleural effusion and right basilar pleural nodularity-stable, stable left lower lobe nodule, persistent sclerosis at T10 ?Pembrolizumab/Alimta 04/05/2019 ?Pembrolizumab 05/07/2019 ?Pembrolizumab 05/28/2019 ?Pembrolizumab 06/18/2019 ?Pembrolizumab 07/09/2019 ?Chest CT 07/26/2019-moderate to large stable exudative right effusion with considerable pleural thickening.  Nodularity at the right lung base along pleural thickening similar to prior.  Stable left paramediastinal groundglass density nodule.  Stable sclerotic lesion posteriorly at the T10 vertebral level. ?Pembrolizumab 07/30/2019 ?Pembrolizumab 08/21/2019-changed to every 6-week dosing ?Pembrolizumab 10/11/2019 ?Pembrolizumab 11/22/2019 ?CT chest 01/03/2020-chronic appearing loculated effusion right chest with signs of pleural thickening smaller than on prior exam; similar appearance of tree-in-bud bud nodularity in the right upper lobe and left lung apex along with bronchiectatic changes in the lingula and middle lobe; similar appearance of sclerosis T10 vertebral body ?Pembrolizumab 01/03/2020 ?Pembrolizumab 02/14/2020 ?Pembrolizumab 03/27/2020 ?Chest CT 05/06/2020-unchanged posttreatment appearance of the chest with a chronic, loculated moderate right pleural effusion with associated atelectasis or consolidation and pleural thickening.  Stable small pulmonary nodules measuring 3 mm or smaller.  ?Pembrolizumab 05/08/2020 ?Pembrolizumab 06/19/2020 ?Pembrolizumab 08/03/2020 ?Pembrolizumab 09/14/2020 ?CT chest 11/04/2020-persistent and enlarging complex loculated right pleural fluid collection.  No findings suspicious for recurrent tumor,  mediastinal/hilar adenopathy or pulmonary metastatic disease. ?CT chest 02/04/2021-chronic loculated right pleural effusion, new ill-defined right upper lobe nodule-nonspecific, coronary and aortic valve calcifications  (next scan planned at a 77-monthinterval) ?CT chest 05/21/2021-substantial increase peripheral airspace opacity in the right upper lobe and superior segment right lower lobe.  Appearance suspicious for multi lobar pneumonia.  Stable thick-walled chronic large right pleural effusion.  New part solid 1.2 cm superior segment left lower lobe nodule.  Stable left upper lobe groundglass density pulmonary nodule.  Increased bandlike nodular opacity laterally in the left lower lobe. ?10-day course of Levaquin   ?Chest x-ray 06/09/2021-new patchy airspace disease left upper lobe, right upper lobe airspace disease has minimally decreased but not resolved.  Stable loculated right pleural effusion ?Thoracentesis 07/06/2021-350 cc of thick bloody fluid removed.  Cytology with atypical cells present.  Culture with no growth. ?CT chest 07/26/2021-progressive ill-defined nodular airspace process in the left lower lobe with hazy surrounding groundglass opacity.  Significant progression of ill-defined groundglass opacity and slight interstitial thickening left upper lobe.  Improved appearance right upper lobe.  Stable thick-walled loculated right pleural fluid collection.  New small left pleural effusion.  Stable underlying lung disease.  No mediastinal or hilar mass or adenopathy. ?Pembrolizumab 07/30/2021 ?Pembrolizumab 08/20/2021 ?CT chest 08/26/2021-new left upper lobe airspace disease with interlobar septal thickening, new airspace disease in the superior segment of the left lower lobe, minimal patchy groundglass opacities in the right upper lobe, increased left pleural effusion, new prominent prevascular node ?Antibiotics and steroid taper with clinical improvement ?Bronchoscopy with biopsies and bronchoalveolar  lavage from the left upper lobe 08/30/2021-culture negative, transbronchial biopsy and brushing negative for malignancy ?MRI lumbar spine 11/01/2021-5.1 cm tumor extending from the posterior L4 body into the spinal canal and caudally infiltrating the posterior L4-L5 disc and L5 superior endplate.  Subsequent severe malignant spinal lateral recess and left foraminal stenosis at L4-L5.  Query left L4 and/or L5 radiculitis.  Noncontiguous indeterminate 2.5 cm mass within the medial left psoas muscle at the L4 level with mild surrounding muscle edema. ?Radiation to lumbar spine at L4-5 and including mass in the left psoas muscle at the level of L4 11/08/2021 - 11/19/2021 ?  ?2.  Severe anemia-likely secondary to bleeding in the right pleural space and metastatic carcinoma, red cell transfusions 09/09/2018 09/10/2018, 09/23/2018-improved; progressive 04/26/2019, red cell transfusion.  Improved. ?3.  Dyspnea secondary to the large right pleural effusion and anemia ?Right Pleurx catheter placed 09/11/2018 ?Chest x-ray 10/09/2018- large right pleural effusion, loculated ?Chest x-ray 11/06/2018- no interval change in the right-sided pleural effusion ?Pleurx drainage catheter removed 11/13/2018 ?4.  History of tobacco use ?5.  History of kidney stones ?6.  Fever-most likely tumor fever, improved ?7.  Admission 10/08/2018 with rapid atrial fibrillation/flutter- improved with diltiazem, started on Eliquis anticoagulation.  Eliquis discontinued.  Now on aspirin. ?8.  Renal insufficiency ?9.  Admission 08/26/2021 with increased dyspnea, CT with progressive left chest airspace disease, clinical improvement following antibiotics and steroids ?10.  Left leg pain and weakness February 2023 ?  ? ?Disposition: Mr. Teater appears unchanged.  He is completing the course of palliative radiation to the lumbar spine/left psoas muscle mass.  Pain is better.  He is scheduled for a PET scan next week.  We had preliminary discussion regarding initiation of  systemic therapy after review of the PET scan. ? ? ?This was a shared visit with Ned Card.  Mr. Bathe will complete the course of palliative radiation tomorrow.  He has experienced significant improvement in p

## 2021-11-19 ENCOUNTER — Encounter: Payer: Self-pay | Admitting: Oncology

## 2021-11-19 ENCOUNTER — Ambulatory Visit
Admission: RE | Admit: 2021-11-19 | Discharge: 2021-11-19 | Disposition: A | Discharge status: Home / Self Care | Payer: Medicare Other | Source: Ambulatory Visit | Attending: Radiation Oncology | Admitting: Radiation Oncology

## 2021-11-19 ENCOUNTER — Encounter: Payer: Self-pay | Admitting: Urology

## 2021-11-19 DIAGNOSIS — C7951 Secondary malignant neoplasm of bone: Secondary | ICD-10-CM

## 2021-11-19 DIAGNOSIS — C3490 Malignant neoplasm of unspecified part of unspecified bronchus or lung: Secondary | ICD-10-CM | POA: Diagnosis not present

## 2021-11-19 DIAGNOSIS — C384 Malignant neoplasm of pleura: Secondary | ICD-10-CM | POA: Diagnosis not present

## 2021-11-19 DIAGNOSIS — Z510 Encounter for antineoplastic radiation therapy: Secondary | ICD-10-CM | POA: Diagnosis not present

## 2021-11-25 ENCOUNTER — Ambulatory Visit (HOSPITAL_COMMUNITY)
Admission: RE | Admit: 2021-11-25 | Discharge: 2021-11-25 | Disposition: A | Discharge status: Home / Self Care | Payer: Medicare Other | Source: Ambulatory Visit | Attending: Nurse Practitioner | Admitting: Nurse Practitioner

## 2021-11-25 DIAGNOSIS — I2510 Atherosclerotic heart disease of native coronary artery without angina pectoris: Secondary | ICD-10-CM | POA: Diagnosis not present

## 2021-11-25 DIAGNOSIS — C7951 Secondary malignant neoplasm of bone: Secondary | ICD-10-CM | POA: Insufficient documentation

## 2021-11-25 DIAGNOSIS — C3490 Malignant neoplasm of unspecified part of unspecified bronchus or lung: Secondary | ICD-10-CM | POA: Diagnosis not present

## 2021-11-25 DIAGNOSIS — C7801 Secondary malignant neoplasm of right lung: Secondary | ICD-10-CM | POA: Diagnosis not present

## 2021-11-25 DIAGNOSIS — C7802 Secondary malignant neoplasm of left lung: Secondary | ICD-10-CM | POA: Diagnosis not present

## 2021-11-25 DIAGNOSIS — J90 Pleural effusion, not elsewhere classified: Secondary | ICD-10-CM | POA: Diagnosis not present

## 2021-11-25 DIAGNOSIS — D492 Neoplasm of unspecified behavior of bone, soft tissue, and skin: Secondary | ICD-10-CM | POA: Diagnosis not present

## 2021-11-25 LAB — GLUCOSE, CAPILLARY: Glucose-Capillary: 90 mg/dL (ref 70–99)

## 2021-11-25 MED ORDER — FLUDEOXYGLUCOSE F - 18 (FDG) INJECTION
6.5000 | Freq: Once | INTRAVENOUS | Status: AC | PRN
  Administered 2021-11-25: 7.83 via INTRAVENOUS

## 2021-12-02 ENCOUNTER — Inpatient Hospital Stay (HOSPITAL_BASED_OUTPATIENT_CLINIC_OR_DEPARTMENT_OTHER): Payer: Medicare Other | Admitting: Oncology

## 2021-12-02 ENCOUNTER — Telehealth: Payer: Self-pay | Admitting: Dietician

## 2021-12-02 VITALS — BP 121/76 | HR 100 | Temp 98.1°F | Resp 18 | Ht 73.0 in | Wt 130.0 lb

## 2021-12-02 DIAGNOSIS — Z79899 Other long term (current) drug therapy: Secondary | ICD-10-CM | POA: Insufficient documentation

## 2021-12-02 DIAGNOSIS — C782 Secondary malignant neoplasm of pleura: Secondary | ICD-10-CM | POA: Diagnosis not present

## 2021-12-02 DIAGNOSIS — C3491 Malignant neoplasm of unspecified part of right bronchus or lung: Secondary | ICD-10-CM | POA: Insufficient documentation

## 2021-12-02 DIAGNOSIS — Z5189 Encounter for other specified aftercare: Secondary | ICD-10-CM | POA: Insufficient documentation

## 2021-12-02 DIAGNOSIS — Z5112 Encounter for antineoplastic immunotherapy: Secondary | ICD-10-CM | POA: Insufficient documentation

## 2021-12-02 DIAGNOSIS — C3490 Malignant neoplasm of unspecified part of unspecified bronchus or lung: Secondary | ICD-10-CM | POA: Diagnosis not present

## 2021-12-02 DIAGNOSIS — R5383 Other fatigue: Secondary | ICD-10-CM | POA: Diagnosis not present

## 2021-12-02 NOTE — Progress Notes (Signed)
?[place] ?OFFICE PROGRESS NOTE ? ? ?Diagnosis: Non-small cell lung cancer ? ?INTERVAL HISTORY:  ? ?Brian Ramirez returns as scheduled.  Back and left leg pain has improved.  Left leg weakness is improving.  He continues to have dyspnea.  He has intermittent episodes of coughing and sputum production.  No hemoptysis.  He remains on home oxygen. ? ?Objective: ? ?Vital signs in last 24 hours: ? ?Blood pressure 121/76, pulse 100, temperature 98.1 ?F (36.7 ?C), temperature source Oral, resp. rate 18, height _0  (1.854 m), weight 130 lb (59 kg), SpO2 98 %. ?  ?Resp: Good air movement bilaterally, decreased breath sounds at the right lower chest, no respiratory distress ?Cardio: Irregular ?GI: No hepatosplenomegaly ?Vascular: No leg edema ?Neuro: Left leg and foot strength appear intact ? ? ?Lab Results: ? ?Lab Results  ?Component Value Date  ? WBC 13.4 (H) 08/31/2021  ? HGB 10.4 (L) 08/31/2021  ? HCT 34.5 (L) 08/31/2021  ? MCV 91.3 08/31/2021  ? PLT 330 08/31/2021  ? NEUTROABS 12.2 (H) 08/31/2021  ? ? ?CMP  ?Lab Results  ?Component Value Date  ? NA 139 08/31/2021  ? K 4.6 08/31/2021  ? CL 103 08/31/2021  ? CO2 32 08/31/2021  ? GLUCOSE 138 (H) 08/31/2021  ? BUN 42 (H) 08/31/2021  ? CREATININE 1.23 08/31/2021  ? CALCIUM 8.9 08/31/2021  ? PROT 6.7 08/30/2021  ? ALBUMIN 2.5 (L) 08/30/2021  ? AST 23 08/30/2021  ? ALT 25 08/30/2021  ? ALKPHOS 73 08/30/2021  ? BILITOT 0.4 08/30/2021  ? GFRNONAA >60 08/31/2021  ? GFRAA 46 (L) 05/08/2020  ? ? ? ?Medications: I have reviewed the patient's current medications. ? ? ?Assessment/Plan: ?Metastatic non-small cell lung cancer ? large right pleural effusion, right lung mass, right pleural-based masses, left lung nodules ?Right thoracentesis 09/04/2018-negative cytology ?CT biopsy of right lateral pleural mass 09/07/2018- poorly differentiated non-small cell carcinoma, malignant cells positive for cytokeratin AE1/AE3 and cytokeratin 8/18, histology favoring adenocarcinoma,  MSS, tumor mutation burden 5.no EGFR, ALK, BRAF alteration.  PDL 1 rearrangement ?PDL 1 tumor proportion score-100%  ?Cycle 1 Alimta/carboplatin 09/22/2018, pembrolizumab 09/28/2018 ?Cycle 2 Alimta/carboplatin/pembrolizumab 10/15/2018 ?Cycle 3 Alimta/carboplatin/pembrolizumab 11/06/2018 ?Cycle 4 Alimta/carboplatin/pembrolizumab 11/28/2018 ?CT chest 12/14/2018- response to therapy of lung primary/primaries, thoracic nodal and pulmonary metastasis.  Decrease in right pleural effusion with improvement in pleural-based metastasis.  New T10 heterogeneous sclerotic density likely due to healing of previously CT occult metastasis. ?Pembrolizumab 12/19/2018 ?Pembrolizumab/Alimta starting 01/09/2019 ?Pembrolizumab/Alimta 01/31/2019 ?Pembrolizumab/Alimta 02/21/2019 ?Pembrolizumab/Alimta 03/14/2019 ?CT 04/01/2019- no evidence of disease progression, small loculated right pleural effusion and right basilar pleural nodularity-stable, stable left lower lobe nodule, persistent sclerosis at T10 ?Pembrolizumab/Alimta 04/05/2019 ?Pembrolizumab 05/07/2019 ?Pembrolizumab 05/28/2019 ?Pembrolizumab 06/18/2019 ?Pembrolizumab 07/09/2019 ?Chest CT 07/26/2019-moderate to large stable exudative right effusion with considerable pleural thickening.  Nodularity at the right lung base along pleural thickening similar to prior.  Stable left paramediastinal groundglass density nodule.  Stable sclerotic lesion posteriorly at the T10 vertebral level. ?Pembrolizumab 07/30/2019 ?Pembrolizumab 08/21/2019-changed to every 6-week dosing ?Pembrolizumab 10/11/2019 ?Pembrolizumab 11/22/2019 ?CT chest 01/03/2020-chronic appearing loculated effusion right chest with signs of pleural thickening smaller than on prior exam; similar appearance of tree-in-bud bud nodularity in the right upper lobe and left lung apex along with bronchiectatic changes in the lingula and middle lobe; similar appearance of sclerosis T10 vertebral body ?Pembrolizumab 01/03/2020 ?Pembrolizumab  02/14/2020 ?Pembrolizumab 03/27/2020 ?Chest CT 05/06/2020-unchanged posttreatment appearance of the chest with a chronic, loculated moderate right pleural effusion with associated atelectasis or consolidation and pleural  thickening.  Stable small pulmonary nodules measuring 3 mm or smaller.  ?Pembrolizumab 05/08/2020 ?Pembrolizumab 06/19/2020 ?Pembrolizumab 08/03/2020 ?Pembrolizumab 09/14/2020 ?CT chest 11/04/2020-persistent and enlarging complex loculated right pleural fluid collection.  No findings suspicious for recurrent tumor, mediastinal/hilar adenopathy or pulmonary metastatic disease. ?CT chest 02/04/2021-chronic loculated right pleural effusion, new ill-defined right upper lobe nodule-nonspecific, coronary and aortic valve calcifications  (next scan planned at a 2-monthinterval) ?CT chest 05/21/2021-substantial increase peripheral airspace opacity in the right upper lobe and superior segment right lower lobe.  Appearance suspicious for multi lobar pneumonia.  Stable thick-walled chronic large right pleural effusion.  New part solid 1.2 cm superior segment left lower lobe nodule.  Stable left upper lobe groundglass density pulmonary nodule.  Increased bandlike nodular opacity laterally in the left lower lobe. ?10-day course of Levaquin   ?Chest x-ray 06/09/2021-new patchy airspace disease left upper lobe, right upper lobe airspace disease has minimally decreased but not resolved.  Stable loculated right pleural effusion ?Thoracentesis 07/06/2021-350 cc of thick bloody fluid removed.  Cytology with atypical cells present.  Culture with no growth. ?CT chest 07/26/2021-progressive ill-defined nodular airspace process in the left lower lobe with hazy surrounding groundglass opacity.  Significant progression of ill-defined groundglass opacity and slight interstitial thickening left upper lobe.  Improved appearance right upper lobe.  Stable thick-walled loculated right pleural fluid collection.  New small left pleural  effusion.  Stable underlying lung disease.  No mediastinal or hilar mass or adenopathy. ?Pembrolizumab 07/30/2021 ?Pembrolizumab 08/20/2021 ?CT chest 08/26/2021-new left upper lobe airspace disease with interlobar septal thickening, new airspace disease in the superior segment of the left lower lobe, minimal patchy groundglass opacities in the right upper lobe, increased left pleural effusion, new prominent prevascular node ?Antibiotics and steroid taper with clinical improvement ?Bronchoscopy with biopsies and bronchoalveolar lavage from the left upper lobe 08/30/2021-culture negative, transbronchial biopsy and brushing negative for malignancy ?MRI lumbar spine 11/01/2021-5.1 cm tumor extending from the posterior L4 body into the spinal canal and caudally infiltrating the posterior L4-L5 disc and L5 superior endplate.  Subsequent severe malignant spinal lateral recess and left foraminal stenosis at L4-L5.  Query left L4 and/or L5 radiculitis.  Noncontiguous indeterminate 2.5 cm mass within the medial left psoas muscle at the L4 level with mild surrounding muscle edema. ?Radiation to lumbar spine at L4-5 and including mass in the left psoas muscle at the level of L4 11/08/2021 - 11/19/2021 ?PET 11/25/2021-extensive pleural at the right hemithorax with pleural lesions invading intercostal muscles and eroding adjacent ribs, small left pleural effusion, bilateral pulmonary metastases, osseous metastatic disease at L4/5 invading the spinal canal, destructive lesion of the left humeral head ?  ?2.  Severe anemia-likely secondary to bleeding in the right pleural space and metastatic carcinoma, red cell transfusions 09/09/2018 09/10/2018, 09/23/2018-improved; progressive 04/26/2019, red cell transfusion.  Improved. ?3.  Dyspnea secondary to the large right pleural effusion and anemia ?Right Pleurx catheter placed 09/11/2018 ?Chest x-ray 10/09/2018- large right pleural effusion, loculated ?Chest x-ray 11/06/2018- no interval change in the  right-sided pleural effusion ?Pleurx drainage catheter removed 11/13/2018 ?4.  History of tobacco use ?5.  History of kidney stones ?6.  Fever-most likely tumor fever, improved ?7.  Admission 10/08/2018 with rapid atrial fibr

## 2021-12-02 NOTE — Telephone Encounter (Signed)
Patient screened on MST. Second attempt to reach. He said he didn't have time to talk again.  I offered to text contact he said he had it.  I let him know it was safe to drink more than 3 Ensure per day and Ensure plus has more calories. Told him I would check in after MD visit on Tuesday.  ? ?April Manson, RDN, LDN ?Registered Dietitian, [place] ?Part Time Remote (Usual office hours: Tuesday-Thursday) ?Cell: [phone]   ?

## 2021-12-02 NOTE — Progress Notes (Signed)
DISCONTINUE OFF PATHWAY REGIMEN - Non-Small Cell Lung ? ? ?[id number]:Pemetrexed 500 mg/m2 q21 Days: ?  A cycle is every 21 days: ?    Pemetrexed  ? ?**Always confirm dose/schedule in your pharmacy ordering system** ? ?REASON: Disease Progression ?PRIOR TREATMENT: Off Pathway: Pemetrexed 500 mg/m2 q21 Days ?TREATMENT RESPONSE: Partial Response (PR) ? ?START ON PATHWAY REGIMEN - Non-Small Cell Lung ? ? ?  A cycle is every 21 days: ?    Ramucirumab  ?    Docetaxel  ? ?**Always confirm dose/schedule in your pharmacy ordering system** ? ?Patient Characteristics: ?Stage IV Metastatic, Nonsquamous, Molecular Analysis Completed, Molecular Alteration Present and Targeted Therapy Exhausted OR EGFR Exon 20+ or KRAS G12C+ or HER2+ Present and No Prior Chemo/Immunotherapy OR No Alteration Present, Second Line -  ?Chemotherapy/Immunotherapy, PS = 0, 1, No Prior PD-1/PD-L1  Inhibitor or Prior PD-1/PD-L1 Inhibitor + Chemotherapy, and Not a Candidate for Immunotherapy ?Therapeutic Status: Stage IV Metastatic ?Histology: Nonsquamous Cell ?Broad Molecular Profiling Status: Molecular Analysis Completed ?Molecular Analysis Results: No Alteration Present ?ECOG Performance Status: 1 ?Chemotherapy/Immunotherapy Line of Therapy: Second Line Chemotherapy/Immunotherapy ?Immunotherapy Candidate Status: Not a Candidate for Immunotherapy ?Prior Immunotherapy Status: Prior PD-1/PD-L1 Inhibitor + Chemotherapy ?Intent of Therapy: ?Non-Curative / Palliative Intent, Discussed with Patient ?

## 2021-12-04 ENCOUNTER — Other Ambulatory Visit: Payer: Self-pay | Admitting: Oncology

## 2021-12-06 ENCOUNTER — Encounter: Payer: Self-pay | Admitting: Oncology

## 2021-12-07 ENCOUNTER — Telehealth (HOSPITAL_COMMUNITY): Payer: Self-pay

## 2021-12-07 ENCOUNTER — Other Ambulatory Visit: Payer: Self-pay

## 2021-12-07 ENCOUNTER — Inpatient Hospital Stay: Payer: Medicare Other | Admitting: Dietician

## 2021-12-07 NOTE — Progress Notes (Signed)
Nutrition Assessment ? ? ?Reason for Assessment: Screen for weight loss on MST ? ? ?ASSESSMENT: Patient is a 76 year old male who has metastatic non-small cell lung cancer. He has a PMHx that includes CHF, Afib, Anemia,  and Malnutrition. He just completed radiation treatments 11/19/21 and is starting Cyramza/taxotere this week. Current treatments include Cyramza/taxotere q21 days, pembrolizumab q 42 days.  He is followed by Dr. [name]. ? ?He reports his usual PO intake includes: ?B: Steel oats, quinoa, banana, yogurt  ?Some times eggs and toast ? ?L: Rice & beans avocado ? ?D: Steam pepper onions carrots broccoli, salmon ? ?Will snack home made trail mix, Cheerios, Rice cereal,   ? ?Ensure TID (using Ensure plus 350kcal)  ? ?There was a time when he was weighing his food.  He states he believe he's eating a lot of volume of foods.  Tries to eat 4 times a day and in middle of the night he'll drink another Ensure.   ? ?Lately he has been  too tired to eat and often sleeps 12 hours a day.  He denies any other nutrition impact symptoms at this time. ? ?Nutrition Focused Physical Exam:  Unable to perform NFPE ? ? ?Medications: States taking prednisone but that hasn't increased his appetite ? ? ?Labs: 08/31/21 Glucose 138, BUN 42 GFR >60, Hgb 10.4 ? ? ?Anthropometrics:   Significant weight loss 12# (8.4%) past  6 weeks ? ? ?Height: 73" ?Weight:  ? ?12/02/21  130# ?11/18/21    133.5# ?11/03/21   137# ?10/18/21     142.4# ? ?UBW: 175 3 years ago, been fluctuating since ?BMI: 17.15 ? ? ?Estimated Energy Needs ? ?Kcals: 1800-2100 ?Protein: 72-90g ?Fluid: >/= 2151mL ? ?  ?NUTRITION DIAGNOSIS: Inadequate PO intake calories r/t cancer treatment side effect of fatigue AEB significant weight loss and usual intake. ? ? ?MALNUTRITION DIAGNOSIS: Suspect Severe malnutrition of chronic disease with significant weight loss and reports of loss of muscle and strength. ? ? ?INTERVENTION: Encouraged healthy fats to boost calories, continued use  of High calorie options and convenience foods when fatigue inhibit food prep. Encouraged increasing dried fruits and nut butters.  Cautioned against trying to regain too much too soon. Goal weight gain 2-3 pounds per month.  Emailed Nutrition tips for fatigue and high calorie snacks. ? ? ?MONITORING, EVALUATION, GOAL: PO intake, weight trends, labs and nutrition impact symptoms. ? ? ?Next Visit: After second infusion, patient prefers afternoon appointments and would rather talk at home and not during infusions. ? ?April Manson, RDN, LDN ?Registered Dietitian, [place] ?Part Time Remote (Usual office hours: Tuesday-Thursday) ?Cell: [phone]  ? ? ?  ?

## 2021-12-07 NOTE — Telephone Encounter (Signed)
No response from pt regarding CR.  Closed referral.  

## 2021-12-09 ENCOUNTER — Inpatient Hospital Stay: Payer: Medicare Other

## 2021-12-09 ENCOUNTER — Other Ambulatory Visit: Payer: Self-pay | Admitting: Nurse Practitioner

## 2021-12-09 ENCOUNTER — Other Ambulatory Visit: Payer: Self-pay

## 2021-12-09 VITALS — BP 135/69 | HR 71 | Temp 98.1°F | Resp 24 | Wt 134.4 lb

## 2021-12-09 DIAGNOSIS — C7951 Secondary malignant neoplasm of bone: Secondary | ICD-10-CM

## 2021-12-09 DIAGNOSIS — C782 Secondary malignant neoplasm of pleura: Secondary | ICD-10-CM | POA: Diagnosis not present

## 2021-12-09 DIAGNOSIS — C3490 Malignant neoplasm of unspecified part of unspecified bronchus or lung: Secondary | ICD-10-CM

## 2021-12-09 DIAGNOSIS — C3491 Malignant neoplasm of unspecified part of right bronchus or lung: Secondary | ICD-10-CM | POA: Diagnosis not present

## 2021-12-09 DIAGNOSIS — Z79899 Other long term (current) drug therapy: Secondary | ICD-10-CM | POA: Diagnosis not present

## 2021-12-09 DIAGNOSIS — R5383 Other fatigue: Secondary | ICD-10-CM

## 2021-12-09 DIAGNOSIS — Z5112 Encounter for antineoplastic immunotherapy: Secondary | ICD-10-CM | POA: Diagnosis not present

## 2021-12-09 DIAGNOSIS — Z5189 Encounter for other specified aftercare: Secondary | ICD-10-CM | POA: Diagnosis not present

## 2021-12-09 LAB — CMP (CANCER CENTER ONLY)
ALT: 9 U/L (ref 0–44)
AST: 17 U/L (ref 15–41)
Albumin: 3.7 g/dL (ref 3.5–5.0)
Alkaline Phosphatase: 90 U/L (ref 38–126)
Anion gap: 8 (ref 5–15)
BUN: 22 mg/dL (ref 8–23)
CO2: 33 mmol/L — ABNORMAL HIGH (ref 22–32)
Calcium: 9.8 mg/dL (ref 8.9–10.3)
Chloride: 98 mmol/L (ref 98–111)
Creatinine: 0.93 mg/dL (ref 0.61–1.24)
GFR, Estimated: >60 mL/min (ref >60)
Glucose, Bld: 103 mg/dL — ABNORMAL HIGH (ref 70–99)
Potassium: 4.3 mmol/L (ref 3.5–5.1)
Sodium: 139 mmol/L (ref 135–145)
Total Bilirubin: 0.4 mg/dL (ref 0.3–1.2)
Total Protein: 7.3 g/dL (ref 6.5–8.1)

## 2021-12-09 LAB — URINALYSIS, ROUTINE W REFLEX MICROSCOPIC
Bilirubin Urine: NEGATIVE
Glucose, UA: NEGATIVE mg/dL
Hgb urine dipstick: NEGATIVE
Leukocytes,Ua: NEGATIVE
Nitrite: NEGATIVE
Protein, ur: 100 mg/dL — AB
Specific Gravity, Urine: 1.031 — ABNORMAL HIGH (ref 1.005–1.030)
pH: 6 (ref 5.0–8.0)

## 2021-12-09 LAB — CBC WITH DIFFERENTIAL (CANCER CENTER ONLY)
Abs Immature Granulocytes: 0.14 10*3/uL — ABNORMAL HIGH (ref 0.00–0.07)
Basophils Absolute: 0 10*3/uL (ref 0.0–0.1)
Basophils Relative: 0 %
Eosinophils Absolute: 0.4 10*3/uL (ref 0.0–0.5)
Eosinophils Relative: 3 %
HCT: 33.1 % — ABNORMAL LOW (ref 39.0–52.0)
Hemoglobin: 10.4 g/dL — ABNORMAL LOW (ref 13.0–17.0)
Immature Granulocytes: 1 %
Lymphocytes Relative: 5 %
Lymphs Abs: 0.7 10*3/uL (ref 0.7–4.0)
MCH: 29.1 pg (ref 26.0–34.0)
MCHC: 31.4 g/dL (ref 30.0–36.0)
MCV: 92.7 fL (ref 80.0–100.0)
Monocytes Absolute: 1.2 10*3/uL — ABNORMAL HIGH (ref 0.1–1.0)
Monocytes Relative: 9 %
Neutro Abs: 11.4 10*3/uL — ABNORMAL HIGH (ref 1.7–7.7)
Neutrophils Relative %: 82 %
Platelet Count: 339 10*3/uL (ref 150–400)
RBC: 3.57 MIL/uL — ABNORMAL LOW (ref 4.22–5.81)
RDW: 15.9 % — ABNORMAL HIGH (ref 11.5–15.5)
WBC Count: 13.7 10*3/uL — ABNORMAL HIGH (ref 4.0–10.5)
nRBC: 0 % (ref 0.0–0.2)

## 2021-12-09 LAB — TSH: TSH: 2.075 u[IU]/mL (ref 0.350–4.500)

## 2021-12-09 LAB — TOTAL PROTEIN, URINE DIPSTICK: Protein, ur: 100 mg/dL — AB

## 2021-12-09 MED ORDER — SODIUM CHLORIDE 0.9 % IV SOLN: Freq: Once | INTRAVENOUS | Status: AC

## 2021-12-09 MED ORDER — SODIUM CHLORIDE 0.9 % IV SOLN
10.0000 mg | Freq: Once | INTRAVENOUS | Status: AC
  Administered 2021-12-09: 10 mg via INTRAVENOUS
  Filled 2021-12-09: qty 1

## 2021-12-09 MED ORDER — SODIUM CHLORIDE 0.9 % IV SOLN
10.0000 mg/kg | Freq: Once | INTRAVENOUS | Status: AC
  Administered 2021-12-09: 600 mg via INTRAVENOUS
  Filled 2021-12-09: qty 50

## 2021-12-09 MED ORDER — ACETAMINOPHEN 325 MG PO TABS
650.0000 mg | ORAL_TABLET | Freq: Once | ORAL | Status: AC
  Administered 2021-12-09: 650 mg via ORAL
  Filled 2021-12-09: qty 2

## 2021-12-09 MED ORDER — SODIUM CHLORIDE 0.9 % IV SOLN
60.0000 mg/m2 | Freq: Once | INTRAVENOUS | Status: AC
  Administered 2021-12-09: 100 mg via INTRAVENOUS
  Filled 2021-12-09: qty 10

## 2021-12-09 MED ORDER — DIPHENHYDRAMINE HCL 50 MG/ML IJ SOLN
50.0000 mg | Freq: Once | INTRAMUSCULAR | Status: AC
  Administered 2021-12-09: 50 mg via INTRAVENOUS
  Filled 2021-12-09: qty 1

## 2021-12-09 MED ORDER — HYDROCODONE-ACETAMINOPHEN 5-325 MG PO TABS: 1.0000 | ORAL_TABLET | Freq: Four times a day (QID) | ORAL | 0 refills | Status: AC | PRN

## 2021-12-09 NOTE — Progress Notes (Signed)
Patient presents for treatment. RN assessment completed along with the following: ? ?Labs/vitals reviewed -  Per Ned Card, NP urine protein 100. Verbal order received to add u/a but ok to proceed with pending results     ?Weight within 10% of previous measurement - Yes ?Informed consent completed and reflects current therapy/intent - Yes, on date 12/09/2021             ?Provider progress note reviewed - Patient not seen by provider today. Most recent note dated 12/02/2021 reviewed. ?Treatment/Antibody/Supportive plan reviewed - Yes, and there are no adjustments needed for today's treatment. ?S&H and other orders reviewed - Yes, and there are no additional orders identified. ?Previous treatment date reviewed - Yes, and the appropriate amount of time has elapsed between treatments. ?Clinic Hand Off Received from - No ?Ned Card, NP saw patient chairside for complaint of painful nodule on right rib area. Patient will call Monday with update. ? ?Patient to proceed with treatment.  ? ?

## 2021-12-09 NOTE — Progress Notes (Signed)
RN requested evaluation in the infusion area due to a painful "lump" at the right chest.  Brian Ramirez reports noticing a painful right lateral chest wall mass since his last office visit on 12/02/2021.  On exam there is a 1 to 2 cm subcutaneous mass with purplish discoloration at the right anterolateral chest wall, tender.  We discussed that this may be related to the lung cancer versus a cyst.  Prescription sent to his pharmacy for hydrocodone.  He will contact the office next week with an update. ?

## 2021-12-09 NOTE — Patient Instructions (Addendum)
Injection [place] Saturday 12/11/2021 at 11:00 ?Ramucirumab injection ?What is this medication? ?RAMUCIRUMAB (ra mue SIR ue mab) is a monoclonal antibody. It is used to treat stomach cancer, colorectal cancer, liver cancer, and lung cancer. ?This medicine may be used for other purposes; ask your health care provider or pharmacist if you have questions. ?COMMON BRAND NAME(S): Cyramza ?What should I tell my care team before I take this medication? ?They need to know if you have any of these conditions: ?bleeding disorders ?blood clots ?heart disease, including heart failure, heart attack, or chest pain (angina) ?high blood pressure ?infection (especially a virus infection such as chickenpox, cold sores, or herpes) ?protein in your urine ?recent or planning to have surgery ?stroke ?an unusual or allergic reaction to ramucirumab, other medicines, foods, dyes, or preservatives ?pregnant or trying to get pregnant ?breast-feeding ?How should I use this medication? ?This medicine is for infusion into a vein. It is given by a health care professional in a hospital or clinic setting. ?Talk to your pediatrician regarding the use of this medicine in children. Special care may be needed. ?Overdosage: If you think you have taken too much of this medicine contact a poison control center or emergency room at once. ?NOTE: This medicine is only for you. Do not share this medicine with others. ?What if I miss a dose? ?It is important not to miss your dose. Call your doctor or health care professional if you are unable to keep an appointment. ?What may interact with this medication? ?Interactions have not been studied. ?This list may not describe all possible interactions. Give your health care provider a list of all the medicines, herbs, non-prescription drugs, or dietary supplements you use. Also tell them if you smoke, drink alcohol, or use illegal drugs. Some items may interact with your medicine. ?What should I  watch for while using this medication? ?Your condition will be monitored carefully while you are receiving this medicine. You will need to to check your blood pressure and have your blood and urine tested while you are taking this medicine. ?Your condition will be monitored carefully while you are receiving this medicine. ?This medicine may increase your risk to bruise or bleed. Call your doctor or health care professional if you notice any unusual bleeding. ?Before having surgery, talk to your health care provider to make sure it is ok. This drug can increase the risk of poor healing of your surgical site or wound. You will need to stop this drug for 28 days before surgery. After surgery, wait at least 2 weeks before restarting this drug. Make sure the surgical site or wound is healed enough before restarting this drug. Talk to your health care provider if questions. ?Do not become pregnant while taking this medicine or for 3 months after stopping it. Women should inform their doctor if they wish to become pregnant or think they might be pregnant. There is a potential for serious side effects to an unborn child. Talk to your health care professional or pharmacist for more information. Do not breast-feed an infant while taking this medicine or for 2 months after stopping it. ?This medicine may interfere with the ability to have a child. Talk with your doctor or health care professional if you are concerned about your fertility. ?What side effects may I notice from receiving this medication? ?Side effects that you should report to your doctor or health care professional as soon as possible: ?allergic reactions like skin rash, itching or hives, breathing  problems, swelling of the face, lips, or tongue ?signs of infection - fever or chills, cough, sore throat ?chest pain or chest tightness ?confusion ?dizziness ?feeling faint or lightheaded, falls ?severe abdominal pain ?severe nausea, vomiting ?signs and symptoms of  bleeding such as bloody or black, tarry stools; red or dark-brown urine; spitting up blood or brown material that looks like coffee grounds; red spots on the skin; unusual bruising or bleeding from the eye, gums, or nose ?signs and symptoms of a blood clot such as breathing problems; changes in vision; chest pain; severe, sudden headache; pain, swelling, warmth in the leg; trouble speaking; sudden numbness or weakness of the face, arm or leg ?symptoms of a stroke: change in mental awareness, inability to talk or move one side of the body ?trouble walking, dizziness, loss of balance or coordination ?Side effects that usually do not require medical attention (report to your doctor or health care professional if they continue or are bothersome): ?cold, clammy skin ?constipation ?diarrhea ?headache ?nausea, vomiting ?stomach pain ?unusually slow heartbeat ?unusually weak or tired ?This list may not describe all possible side effects. Call your doctor for medical advice about side effects. You may report side effects to FDA at 1-800-FDA-1088. ?Where should I keep my medication? ?This drug is given in a hospital or clinic and will not be stored at home. ?NOTE: This sheet is a summary. It may not cover all possible information. If you have questions about this medicine, talk to your doctor, pharmacist, or health care provider. ?? 2023 Elsevier/Gold Standard (2021-07-02 00:00:00) ? ? ? ? ?Docetaxel injection ?What is this medication? ?DOCETAXEL (doe se TAX el) is a chemotherapy drug. It targets fast dividing cells, like cancer cells, and causes these cells to die. This medicine is used to treat many types of cancers like breast cancer, certain stomach cancers, head and neck cancer, lung cancer, and prostate cancer. ?This medicine may be used for other purposes; ask your health care provider or pharmacist if you have questions. ?COMMON BRAND NAME(S): Docefrez, Taxotere ?What should I tell my care team before I take this  medication? ?They need to know if you have any of these conditions: ?infection (especially a virus infection such as chickenpox, cold sores, or herpes) ?liver disease ?low blood counts, like low white cell, platelet, or red cell counts ?an unusual or allergic reaction to docetaxel, polysorbate 80, other chemotherapy agents, other medicines, foods, dyes, or preservatives ?pregnant or trying to get pregnant ?breast-feeding ?How should I use this medication? ?This drug is given as an infusion into a vein. It is administered in a hospital or clinic by a specially trained health care professional. ?Talk to your pediatrician regarding the use of this medicine in children. Special care may be needed. ?Overdosage: If you think you have taken too much of this medicine contact a poison control center or emergency room at once. ?NOTE: This medicine is only for you. Do not share this medicine with others. ?What if I miss a dose? ?It is important not to miss your dose. Call your doctor or health care professional if you are unable to keep an appointment. ?What may interact with this medication? ?Do not take this medicine with any of the following medications: ?live virus vaccines ?This medicine may also interact with the following medications: ?aprepitant ?certain antibiotics like erythromycin or clarithromycin ?certain antivirals for HIV or hepatitis ?certain medicines for fungal infections like fluconazole, itraconazole, ketoconazole, posaconazole, or voriconazole ?cimetidine ?ciprofloxacin ?conivaptan ?cyclosporine ?dronedarone ?fluvoxamine ?grapefruit juice ?imatinib ?verapamil ?  This list may not describe all possible interactions. Give your health care provider a list of all the medicines, herbs, non-prescription drugs, or dietary supplements you use. Also tell them if you smoke, drink alcohol, or use illegal drugs. Some items may interact with your medicine. ?What should I watch for while using this medication? ?Your  condition will be monitored carefully while you are receiving this medicine. You will need important blood work done while you are taking this medicine. ?Call your doctor or health care professional for advice if

## 2021-12-10 ENCOUNTER — Inpatient Hospital Stay: Payer: Medicare Other

## 2021-12-10 NOTE — Addendum Note (Signed)
Addended by: Roselind Messier A on: 12/10/2021 01:20 PM ? ? Modules accepted: Orders ? ?

## 2021-12-11 ENCOUNTER — Inpatient Hospital Stay: Payer: Medicare Other

## 2021-12-11 VITALS — BP 121/73 | HR 88 | Temp 97.6°F | Resp 19 | Ht 73.0 in

## 2021-12-11 DIAGNOSIS — Z79899 Other long term (current) drug therapy: Secondary | ICD-10-CM | POA: Diagnosis not present

## 2021-12-11 DIAGNOSIS — C3490 Malignant neoplasm of unspecified part of unspecified bronchus or lung: Secondary | ICD-10-CM

## 2021-12-11 DIAGNOSIS — Z5189 Encounter for other specified aftercare: Secondary | ICD-10-CM | POA: Diagnosis not present

## 2021-12-11 DIAGNOSIS — C3491 Malignant neoplasm of unspecified part of right bronchus or lung: Secondary | ICD-10-CM | POA: Diagnosis not present

## 2021-12-11 DIAGNOSIS — Z5112 Encounter for antineoplastic immunotherapy: Secondary | ICD-10-CM | POA: Diagnosis not present

## 2021-12-11 DIAGNOSIS — C7951 Secondary malignant neoplasm of bone: Secondary | ICD-10-CM

## 2021-12-11 DIAGNOSIS — C782 Secondary malignant neoplasm of pleura: Secondary | ICD-10-CM | POA: Diagnosis not present

## 2021-12-11 MED ORDER — PEGFILGRASTIM-CBQV 6 MG/0.6ML ~~LOC~~ SOSY
6.0000 mg | PREFILLED_SYRINGE | Freq: Once | SUBCUTANEOUS | Status: AC
  Administered 2021-12-11: 6 mg via SUBCUTANEOUS
  Filled 2021-12-11: qty 0.6

## 2021-12-11 NOTE — Patient Instructions (Signed)
Injection [place] Saturday 12/11/2021 at 11:00 ?Ramucirumab injection ?What is this medication? ?RAMUCIRUMAB (ra mue SIR ue mab) is a monoclonal antibody. It is used to treat stomach cancer, colorectal cancer, liver cancer, and lung cancer. ?This medicine may be used for other purposes; ask your health care provider or pharmacist if you have questions. ?COMMON BRAND NAME(S): Cyramza ?What should I tell my care team before I take this medication? ?They need to know if you have any of these conditions: ?bleeding disorders ?blood clots ?heart disease, including heart failure, heart attack, or chest pain (angina) ?high blood pressure ?infection (especially a virus infection such as chickenpox, cold sores, or herpes) ?protein in your urine ?recent or planning to have surgery ?stroke ?an unusual or allergic reaction to ramucirumab, other medicines, foods, dyes, or preservatives ?pregnant or trying to get pregnant ?breast-feeding ?How should I use this medication? ?This medicine is for infusion into a vein. It is given by a health care professional in a hospital or clinic setting. ?Talk to your pediatrician regarding the use of this medicine in children. Special care may be needed. ?Overdosage: If you think you have taken too much of this medicine contact a poison control center or emergency room at once. ?NOTE: This medicine is only for you. Do not share this medicine with others. ?What if I miss a dose? ?It is important not to miss your dose. Call your doctor or health care professional if you are unable to keep an appointment. ?What may interact with this medication? ?Interactions have not been studied. ?This list may not describe all possible interactions. Give your health care provider a list of all the medicines, herbs, non-prescription drugs, or dietary supplements you use. Also tell them if you smoke, drink alcohol, or use illegal drugs. Some items may interact with your medicine. ?What should I  watch for while using this medication? ?Your condition will be monitored carefully while you are receiving this medicine. You will need to to check your blood pressure and have your blood and urine tested while you are taking this medicine. ?Your condition will be monitored carefully while you are receiving this medicine. ?This medicine may increase your risk to bruise or bleed. Call your doctor or health care professional if you notice any unusual bleeding. ?Before having surgery, talk to your health care provider to make sure it is ok. This drug can increase the risk of poor healing of your surgical site or wound. You will need to stop this drug for 28 days before surgery. After surgery, wait at least 2 weeks before restarting this drug. Make sure the surgical site or wound is healed enough before restarting this drug. Talk to your health care provider if questions. ?Do not become pregnant while taking this medicine or for 3 months after stopping it. Women should inform their doctor if they wish to become pregnant or think they might be pregnant. There is a potential for serious side effects to an unborn child. Talk to your health care professional or pharmacist for more information. Do not breast-feed an infant while taking this medicine or for 2 months after stopping it. ?This medicine may interfere with the ability to have a child. Talk with your doctor or health care professional if you are concerned about your fertility. ?What side effects may I notice from receiving this medication? ?Side effects that you should report to your doctor or health care professional as soon as possible: ?allergic reactions like skin rash, itching or hives, breathing  problems, swelling of the face, lips, or tongue ?signs of infection - fever or chills, cough, sore throat ?chest pain or chest tightness ?confusion ?dizziness ?feeling faint or lightheaded, falls ?severe abdominal pain ?severe nausea, vomiting ?signs and symptoms of  bleeding such as bloody or black, tarry stools; red or dark-brown urine; spitting up blood or brown material that looks like coffee grounds; red spots on the skin; unusual bruising or bleeding from the eye, gums, or nose ?signs and symptoms of a blood clot such as breathing problems; changes in vision; chest pain; severe, sudden headache; pain, swelling, warmth in the leg; trouble speaking; sudden numbness or weakness of the face, arm or leg ?symptoms of a stroke: change in mental awareness, inability to talk or move one side of the body ?trouble walking, dizziness, loss of balance or coordination ?Side effects that usually do not require medical attention (report to your doctor or health care professional if they continue or are bothersome): ?cold, clammy skin ?constipation ?diarrhea ?headache ?nausea, vomiting ?stomach pain ?unusually slow heartbeat ?unusually weak or tired ?This list may not describe all possible side effects. Call your doctor for medical advice about side effects. You may report side effects to FDA at 1-800-FDA-1088. ?Where should I keep my medication? ?This drug is given in a hospital or clinic and will not be stored at home. ?NOTE: This sheet is a summary. It may not cover all possible information. If you have questions about this medicine, talk to your doctor, pharmacist, or health care provider. ?? 2023 Elsevier/Gold Standard (2021-07-02 00:00:00) ? ? ? ? ?Docetaxel injection ?What is this medication? ?DOCETAXEL (doe se TAX el) is a chemotherapy drug. It targets fast dividing cells, like cancer cells, and causes these cells to die. This medicine is used to treat many types of cancers like breast cancer, certain stomach cancers, head and neck cancer, lung cancer, and prostate cancer. ?This medicine may be used for other purposes; ask your health care provider or pharmacist if you have questions. ?COMMON BRAND NAME(S): Docefrez, Taxotere ?What should I tell my care team before I take this  medication? ?They need to know if you have any of these conditions: ?infection (especially a virus infection such as chickenpox, cold sores, or herpes) ?liver disease ?low blood counts, like low white cell, platelet, or red cell counts ?an unusual or allergic reaction to docetaxel, polysorbate 80, other chemotherapy agents, other medicines, foods, dyes, or preservatives ?pregnant or trying to get pregnant ?breast-feeding ?How should I use this medication? ?This drug is given as an infusion into a vein. It is administered in a hospital or clinic by a specially trained health care professional. ?Talk to your pediatrician regarding the use of this medicine in children. Special care may be needed. ?Overdosage: If you think you have taken too much of this medicine contact a poison control center or emergency room at once. ?NOTE: This medicine is only for you. Do not share this medicine with others. ?What if I miss a dose? ?It is important not to miss your dose. Call your doctor or health care professional if you are unable to keep an appointment. ?What may interact with this medication? ?Do not take this medicine with any of the following medications: ?live virus vaccines ?This medicine may also interact with the following medications: ?aprepitant ?certain antibiotics like erythromycin or clarithromycin ?certain antivirals for HIV or hepatitis ?certain medicines for fungal infections like fluconazole, itraconazole, ketoconazole, posaconazole, or voriconazole ?cimetidine ?ciprofloxacin ?conivaptan ?cyclosporine ?dronedarone ?fluvoxamine ?grapefruit juice ?imatinib ?verapamil ?  This list may not describe all possible interactions. Give your health care provider a list of all the medicines, herbs, non-prescription drugs, or dietary supplements you use. Also tell them if you smoke, drink alcohol, or use illegal drugs. Some items may interact with your medicine. ?What should I watch for while using this medication? ?Your  condition will be monitored carefully while you are receiving this medicine. You will need important blood work done while you are taking this medicine. ?Call your doctor or health care professional for advice if

## 2021-12-16 ENCOUNTER — Inpatient Hospital Stay: Payer: Medicare Other | Attending: Nurse Practitioner

## 2021-12-16 DIAGNOSIS — Z79899 Other long term (current) drug therapy: Secondary | ICD-10-CM | POA: Diagnosis not present

## 2021-12-16 DIAGNOSIS — Z5112 Encounter for antineoplastic immunotherapy: Secondary | ICD-10-CM | POA: Insufficient documentation

## 2021-12-16 DIAGNOSIS — C782 Secondary malignant neoplasm of pleura: Secondary | ICD-10-CM | POA: Diagnosis not present

## 2021-12-16 DIAGNOSIS — Z5189 Encounter for other specified aftercare: Secondary | ICD-10-CM | POA: Diagnosis not present

## 2021-12-16 DIAGNOSIS — C3491 Malignant neoplasm of unspecified part of right bronchus or lung: Secondary | ICD-10-CM | POA: Diagnosis not present

## 2021-12-16 DIAGNOSIS — Z5111 Encounter for antineoplastic chemotherapy: Secondary | ICD-10-CM | POA: Diagnosis not present

## 2021-12-16 DIAGNOSIS — C3490 Malignant neoplasm of unspecified part of unspecified bronchus or lung: Secondary | ICD-10-CM

## 2021-12-16 DIAGNOSIS — C7951 Secondary malignant neoplasm of bone: Secondary | ICD-10-CM | POA: Insufficient documentation

## 2021-12-16 LAB — CBC WITH DIFFERENTIAL (CANCER CENTER ONLY)
Abs Immature Granulocytes: 0.1 K/uL — ABNORMAL HIGH (ref 0.00–0.07)
Band Neutrophils: 15 %
Basophils Absolute: 0.1 K/uL (ref 0.0–0.1)
Basophils Relative: 1 %
Eosinophils Absolute: 0 K/uL (ref 0.0–0.5)
Eosinophils Relative: 0 %
HCT: 33 % — ABNORMAL LOW (ref 39.0–52.0)
Hemoglobin: 10.1 g/dL — ABNORMAL LOW (ref 13.0–17.0)
Lymphocytes Relative: 9 %
Lymphs Abs: 0.6 K/uL — ABNORMAL LOW (ref 0.7–4.0)
MCH: 28.9 pg (ref 26.0–34.0)
MCHC: 30.6 g/dL (ref 30.0–36.0)
MCV: 94.3 fL (ref 80.0–100.0)
Metamyelocytes Relative: 1 %
Monocytes Absolute: 0.5 K/uL (ref 0.1–1.0)
Monocytes Relative: 7 %
Neutro Abs: 5.7 K/uL (ref 1.7–7.7)
Neutrophils Relative %: 67 %
Platelet Count: 169 K/uL (ref 150–400)
RBC: 3.5 MIL/uL — ABNORMAL LOW (ref 4.22–5.81)
RDW: 15.2 % (ref 11.5–15.5)
WBC Count: 6.9 K/uL (ref 4.0–10.5)
nRBC: 0 % (ref 0.0–0.2)

## 2021-12-16 LAB — TOTAL PROTEIN, URINE DIPSTICK: Protein, ur: 100 mg/dL — AB

## 2021-12-19 DIAGNOSIS — Z20822 Contact with and (suspected) exposure to covid-19: Secondary | ICD-10-CM | POA: Diagnosis not present

## 2021-12-23 ENCOUNTER — Encounter: Payer: Self-pay | Admitting: Urology

## 2021-12-23 ENCOUNTER — Ambulatory Visit
Admission: RE | Admit: 2021-12-23 | Discharge: 2021-12-23 | Disposition: A | Discharge status: Home / Self Care | Payer: Medicare Other | Source: Ambulatory Visit | Attending: Radiation Oncology | Admitting: Radiation Oncology

## 2021-12-23 DIAGNOSIS — C3490 Malignant neoplasm of unspecified part of unspecified bronchus or lung: Secondary | ICD-10-CM | POA: Insufficient documentation

## 2021-12-23 DIAGNOSIS — C7951 Secondary malignant neoplasm of bone: Secondary | ICD-10-CM

## 2021-12-23 NOTE — Progress Notes (Signed)
?  Radiation Oncology         [phone] ?________________________________ ? ?Name: JHETT FRETWELL MRN: [id number]  ?Date: 11/19/2021  DOB: 01/06/1946 ? ?End of Treatment Note ? 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    ? ?Indication for treatment:  Palliation      ? ?Radiation treatment dates:   11/08/21 - 11/19/21 ? ?Site/dose:   The painful sites of metastatic disease at L4-L5 and in the left psoas muscle at the level of L4 were treated to 30 Gy in 10 fractions of 3 Gy each. ? ?Beams/energy:   A 3D field set-up was employed with 6 MV X-rays ? ?Narrative: The patient tolerated radiation treatment relatively well with only modest fatigue noted. ? ?Plan: The patient has completed radiation treatment. The patient will return to radiation oncology clinic for routine followup in one month. I advised him to call or return sooner if he has any questions or concerns related to his recovery or treatment. ?________________________________ ? ?Sheral Apley Tammi Klippel, M.D. ?  ? ?

## 2021-12-23 NOTE — Progress Notes (Signed)
Telephone appointment. I verified patient identity and began nursing interview. Patient reports some shortness of breath, mild RT sided chest pains, and a new onset of an external, 1/2 inch sized cyst growing on the RT side of central chest. No other issues reported at this time. ? ?Meaningful use complete. ? ?Reminded patient of his 2:00pm-12/23/21 telephone appointment w/ Ashlyn Bruning PA-C. I left my extension [phone] in case patient needs anything. Patient verbalized understanding of information. ? ?Patient preferred contact [phone]  ?

## 2021-12-23 NOTE — Progress Notes (Signed)
?Radiation Oncology         ([phone] ?________________________________ ? ?Name: Brian Ramirez MRN: [id number]  ?Date: 12/23/2021  DOB: [birth year] ? ?Post Treatment Note ? ?CC: Koirala, Dibas, MD  Ladell Pier, MD ? 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 ? ?Interval Since Last Radiation:  5 weeks  ?11/08/21 - 11/19/21:  The painful sites of metastatic disease at L4-L5 and in the left psoas muscle at the level of L4 were treated to 30 Gy in 10 fractions of 3 Gy each. ? ?Narrative:  I spoke with the patient to conduct his routine scheduled 1 month follow up visit via telephone to spare the patient unnecessary potential exposure in the healthcare setting during the current COVID-19 pandemic.  The patient was notified in advance and gave permission to proceed with this visit format. ? ?He tolerated radiation treatment relatively well with only modest fatigue noted. ?                              ? ?On review of systems, the patient states that he is doing very well in general.  He reports resolution of his back and left leg pain and has had significant improvement in the strength in the left leg.  He is now able to ambulate without pain which he is quite pleased with.  Since he did not tolerate the single agent Keytruda very well, he was recently switched to docetaxel/ramucirumab and unfortunately is dealing with some ulcers in his mouth but otherwise, feels he is tolerating this fairly well. ? ?ALLERGIES:  has No Known Allergies. ? ?Meds: ?Current Outpatient Medications  ?Medication Sig Dispense Refill  ? albuterol (VENTOLIN HFA) 108 (90 Base) MCG/ACT inhaler Inhale 2 puffs into the lungs every 6 (six) hours as needed for wheezing or shortness of breath. 18 g 2  ? Artificial Tear Ointment (DRY EYES OP) Apply 1 drop to eye as needed (Dry, itching eyes).    ? aspirin EC 81 MG tablet  Take 1 tablet (81 mg total) by mouth daily. 90 tablet 3  ? dexamethasone (DECADRON) 4 MG tablet Take 1 tablet (4 mg total) by mouth every 6 (six) hours. (Patient taking differently: Take 4 mg by mouth once. Take 8 mg (2 tabs ) By mouth day after docetaxel treatment.) 40 tablet 0  ? feeding supplement (ENSURE ENLIVE / ENSURE PLUS) LIQD Take 237 mLs by mouth 3 (three) times daily between meals. 237 mL 12  ? gabapentin (NEURONTIN) 300 MG capsule Take by mouth 3 (three) times daily. For 7 days started 10/18/21 (Patient not taking: Reported on 12/02/2021)    ? HYDROcodone-acetaminophen (NORCO) 5-325 MG tablet Take 1 tablet by mouth every 6 (six) hours as needed for moderate pain. 60 tablet 0  ? hydrocortisone cream 1 % 1 application    ? ibuprofen (ADVIL) 100 MG chewable tablet Chew by mouth as needed (as needed).    ? Tiotropium Bromide-Olodaterol (STIOLTO RESPIMAT) 2.5-2.5 MCG/ACT AERS Inhale 2 puffs into the lungs daily. (Patient not taking: Reported on 12/02/2021) 4 g 0  ? ?No current facility-administered medications for this encounter.  ? ? ?Physical Findings: ? vitals were not taken for this visit.  ?Pain Assessment ?Pain Score: 4  (LT sided (lungs))/10 ?Unable to assess due to telephone follow-up visit format. ? ?Lab Findings: ?  Lab Results  ?Component Value Date  ? WBC 6.9 12/16/2021  ? HGB 10.1 (L) 12/16/2021  ? HCT 33.0 (L) 12/16/2021  ? MCV 94.3 12/16/2021  ? PLT 169 12/16/2021  ? ? ? ?Radiographic Findings: ?NM PET Image Initial (PI) Skull Base To Thigh ? ?Result Date: 11/26/2021 ?CLINICAL DATA:  Initial treatment strategy for lung cancer. Initial diagnosis 08/30/2021. History of Keytruda treatment and radiation to the lumbar spine. EXAM: NUCLEAR MEDICINE PET SKULL BASE TO THIGH TECHNIQUE: 7.83 mCi F-18 FDG was injected intravenously. Full-ring PET imaging was performed from the skull base to thigh after the radiotracer. CT data was obtained and used for attenuation correction and anatomic localization. Fasting  blood glucose: 90 mg/dl COMPARISON:  Chest CT 08/26/2021 and lumbar spine MRI 11/01/2021 FINDINGS: Mediastinal blood pool activity: SUV max 1.90 Liver activity: SUV max NA NECK: No hypermetabolic lymph nodes in the neck. Incidental CT findings: none CHEST: Numerous hypermetabolic pleural lesions involving the right hemithorax. Pleural lesions involving the right lower thorax is invading the chest wall and involving the intercostal muscle between the eighth and ninth ribs. SUV max is 37.33. There is associated destructive bony change involving the ninth rib. More inferiorly there is extensive pleural tumor along the tenth and eleventh ribs with hypermetabolism and SUV max of 22.1. 13 mm pleural metastasis involving the intercostal muscles between the seventh and eighth ribs appears to be partially eroding the seventh rib. SUV max is 19.91. Nodular mass at the right lung base likely a pleural lesion associated with the major fissure this measures approximately 22 x 18 mm on image 105/4 and SUV max is 21.38. Hypermetabolic pleural lesions are also noted in the left hemithorax with an associated small left pleural effusion. Lesion at the left lung base has an SUV max of 6.43. Pulmonary metastasis are also noted bilaterally 11 mm nodule in the superior segment of the left lower lobe on image 82/4 has an SUV max of 5.65. No enlarged or hypermetabolic mediastinal or hilar lymph nodes. Incidental CT findings: Underlying emphysematous changes pulmonary scarring. Patchy airspace disease in both lungs. Aortic and coronary artery calcifications are again noted. ABDOMEN/PELVIS: No findings for abdominal/pelvic metastatic disease. No hepatic or adrenal gland lesions. No abdominal or pelvic lymphadenopathy. Incidental CT findings: Moderate atherosclerotic calcifications involving the aorta and iliac arteries. SKELETON: Hypermetabolic metastatic bone disease is noted. As demonstrated on the recent MRI examination there is a large  bony metastasis at the L4-5 level involving the spinal canal and left neural foramen. SUV max is 9.15 Lytic destructive cortical lesion involving the left humeral head has an SUV max of 12.15. Incidental CT findings: none IMPRESSION: 1. Extensive pleural tumor involving the right hemithorax. Associated moderate-sized loculated right pleural effusion. Some of the pleural lesions are invading the intercostal muscles and eroding the adjacent ribs. 2. Less advanced left pleural disease with small left pleural effusion. 3. Pulmonary metastasis bilaterally. 4. No mediastinal or hilar mass or adenopathy. 5. No findings for abdominal/pelvic metastatic disease. 6. Osseous metastatic disease with large destructive lesion involving L4 and L5 and invading the spinal canal. Destructive cortical lesion involving the left humeral head is also noted. Electronically Signed   By: Marijo Sanes M.D.   On: 11/26/2021 15:44   ? ?Impression/Plan: ?43. 76 year old male with painful bony metastatic disease in the spine involving L4-L5 and left psoas muscle at the level of L4, secondary to metastatic NSCLC. ?He appears to have recovered well from the effects of his recent palliative radiation and  is currently without complaints.  He is most pleased with the resolution of pain and improvement in strength in that left leg.  We discussed that while we are happy to continue to participate in his care if clinically indicated, at this point, we will plan to see him back on an as-needed basis.  He will continue in routine follow-up under the care and direction of Dr. [name] for continued management of his systemic disease.  He knows that he is welcome to call at anytime with any questions or concerns related to his previous radiation. ? ? ? ?Nicholos Johns, PA-C  ?

## 2021-12-26 ENCOUNTER — Other Ambulatory Visit: Payer: Self-pay | Admitting: Oncology

## 2021-12-28 ENCOUNTER — Ambulatory Visit: Payer: Medicare Other

## 2021-12-28 ENCOUNTER — Other Ambulatory Visit: Payer: Medicare Other

## 2021-12-29 ENCOUNTER — Ambulatory Visit: Payer: Medicare Other

## 2021-12-29 ENCOUNTER — Ambulatory Visit: Payer: Medicare Other | Admitting: Oncology

## 2021-12-30 ENCOUNTER — Inpatient Hospital Stay (HOSPITAL_BASED_OUTPATIENT_CLINIC_OR_DEPARTMENT_OTHER): Payer: Medicare Other | Admitting: Oncology

## 2021-12-30 ENCOUNTER — Other Ambulatory Visit: Payer: Self-pay

## 2021-12-30 ENCOUNTER — Other Ambulatory Visit (HOSPITAL_BASED_OUTPATIENT_CLINIC_OR_DEPARTMENT_OTHER): Payer: Self-pay

## 2021-12-30 ENCOUNTER — Encounter: Payer: Self-pay | Admitting: Oncology

## 2021-12-30 ENCOUNTER — Inpatient Hospital Stay: Payer: Medicare Other

## 2021-12-30 VITALS — BP 119/75 | HR 99 | Temp 98.1°F | Resp 18 | Ht 73.0 in | Wt 136.8 lb

## 2021-12-30 VITALS — BP 127/86 | HR 77 | Temp 98.6°F | Resp 16

## 2021-12-30 DIAGNOSIS — Z5189 Encounter for other specified aftercare: Secondary | ICD-10-CM | POA: Diagnosis not present

## 2021-12-30 DIAGNOSIS — C3490 Malignant neoplasm of unspecified part of unspecified bronchus or lung: Secondary | ICD-10-CM | POA: Diagnosis not present

## 2021-12-30 DIAGNOSIS — C7951 Secondary malignant neoplasm of bone: Secondary | ICD-10-CM

## 2021-12-30 DIAGNOSIS — Z5112 Encounter for antineoplastic immunotherapy: Secondary | ICD-10-CM | POA: Diagnosis not present

## 2021-12-30 DIAGNOSIS — C782 Secondary malignant neoplasm of pleura: Secondary | ICD-10-CM | POA: Diagnosis not present

## 2021-12-30 DIAGNOSIS — Z5111 Encounter for antineoplastic chemotherapy: Secondary | ICD-10-CM | POA: Diagnosis not present

## 2021-12-30 DIAGNOSIS — C3491 Malignant neoplasm of unspecified part of right bronchus or lung: Secondary | ICD-10-CM | POA: Diagnosis not present

## 2021-12-30 LAB — CMP (CANCER CENTER ONLY)
ALT: 10 U/L (ref 0–44)
AST: 17 U/L (ref 15–41)
Albumin: 3.4 g/dL — ABNORMAL LOW (ref 3.5–5.0)
Alkaline Phosphatase: 102 U/L (ref 38–126)
Anion gap: 9 (ref 5–15)
BUN: 21 mg/dL (ref 8–23)
CO2: 32 mmol/L (ref 22–32)
Calcium: 9.7 mg/dL (ref 8.9–10.3)
Chloride: 100 mmol/L (ref 98–111)
Creatinine: 1.01 mg/dL (ref 0.61–1.24)
GFR, Estimated: >60 mL/min (ref >60)
Glucose, Bld: 109 mg/dL — ABNORMAL HIGH (ref 70–99)
Potassium: 4.4 mmol/L (ref 3.5–5.1)
Sodium: 141 mmol/L (ref 135–145)
Total Bilirubin: 0.4 mg/dL (ref 0.3–1.2)
Total Protein: 7.1 g/dL (ref 6.5–8.1)

## 2021-12-30 LAB — CBC WITH DIFFERENTIAL (CANCER CENTER ONLY)
Abs Immature Granulocytes: 0.08 10*3/uL — ABNORMAL HIGH (ref 0.00–0.07)
Basophils Absolute: 0.1 10*3/uL (ref 0.0–0.1)
Basophils Relative: 0 %
Eosinophils Absolute: 0.1 10*3/uL (ref 0.0–0.5)
Eosinophils Relative: 0 %
HCT: 33.2 % — ABNORMAL LOW (ref 39.0–52.0)
Hemoglobin: 10 g/dL — ABNORMAL LOW (ref 13.0–17.0)
Immature Granulocytes: 1 %
Lymphocytes Relative: 6 %
Lymphs Abs: 1 10*3/uL (ref 0.7–4.0)
MCH: 29.3 pg (ref 26.0–34.0)
MCHC: 30.1 g/dL (ref 30.0–36.0)
MCV: 97.4 fL (ref 80.0–100.0)
Monocytes Absolute: 1.6 10*3/uL — ABNORMAL HIGH (ref 0.1–1.0)
Monocytes Relative: 10 %
Neutro Abs: 13.3 10*3/uL — ABNORMAL HIGH (ref 1.7–7.7)
Neutrophils Relative %: 83 %
Platelet Count: 332 10*3/uL (ref 150–400)
RBC: 3.41 MIL/uL — ABNORMAL LOW (ref 4.22–5.81)
RDW: 16 % — ABNORMAL HIGH (ref 11.5–15.5)
WBC Count: 16.1 10*3/uL — ABNORMAL HIGH (ref 4.0–10.5)
nRBC: 0 % (ref 0.0–0.2)

## 2021-12-30 MED ORDER — DIPHENHYDRAMINE HCL 50 MG/ML IJ SOLN
50.0000 mg | Freq: Once | INTRAMUSCULAR | Status: AC
  Administered 2021-12-30: 50 mg via INTRAVENOUS
  Filled 2021-12-30: qty 1

## 2021-12-30 MED ORDER — NYSTATIN 100000 UNIT/ML MT SUSP
OROMUCOSAL | 0 refills | Status: AC
  Filled 2021-12-30: qty 240, 12d supply, fill #0

## 2021-12-30 MED ORDER — ACETAMINOPHEN 325 MG PO TABS
650.0000 mg | ORAL_TABLET | Freq: Once | ORAL | Status: AC
  Administered 2021-12-30: 650 mg via ORAL
  Filled 2021-12-30: qty 2

## 2021-12-30 MED ORDER — SODIUM CHLORIDE 0.9 % IV SOLN
10.0000 mg/kg | Freq: Once | INTRAVENOUS | Status: AC
  Administered 2021-12-30: 600 mg via INTRAVENOUS
  Filled 2021-12-30: qty 50

## 2021-12-30 MED ORDER — SODIUM CHLORIDE 0.9 % IV SOLN
10.0000 mg | Freq: Once | INTRAVENOUS | Status: AC
  Administered 2021-12-30: 10 mg via INTRAVENOUS
  Filled 2021-12-30: qty 1

## 2021-12-30 MED ORDER — MAGIC MOUTHWASH: 5.0000 mL | Freq: Four times a day (QID) | ORAL | 0 refills | Status: AC

## 2021-12-30 MED ORDER — SODIUM CHLORIDE 0.9 % IV SOLN
45.0000 mg/m2 | Freq: Once | INTRAVENOUS | Status: AC
  Administered 2021-12-30: 80 mg via INTRAVENOUS
  Filled 2021-12-30: qty 8

## 2021-12-30 MED ORDER — SODIUM CHLORIDE 0.9 % IV SOLN: Freq: Once | INTRAVENOUS | Status: AC

## 2021-12-30 NOTE — Progress Notes (Signed)
Patient seen by Dr. [name] today  Vitals are within treatment parameters.  Labs reviewed by Dr. [name] CBC diff reviewed and within treatment parameters, CMP pending.  Per physician team, patient is ready for treatment. Please note that modifications are being made to the treatment plan including dose reduction for Docetaxel.    Magic Mouthwash prescription prescribed

## 2021-12-30 NOTE — Progress Notes (Signed)
[facility] OFFICE PROGRESS NOTE   Diagnosis: Non-small cell lung cancer  INTERVAL HISTORY:   Brian Ramirez completed a cycle of docetaxel/ramucirumab 12/09/2021.  He developed mouth sores for 1 week following chemotherapy.  This limited his ability to eat.  He had discomfort in the left arm after the Udenyca injection.  No neuropathy symptoms.  No swelling.  Dyspnea has progressed.  He is now using oxygen at a rate of 4 L.  He is ambulatory in the home.  He is here today with his brother.  Objective:  Vital signs in last 24 hours:  Blood pressure 119/75, pulse 99, temperature 98.1 F (36.7 C), temperature source Oral, resp. rate 18, height 6' 1" (1.854 m), weight 136 lb 12.8 oz (62.1 kg), SpO2 98 %.    HEENT: No thrush, healing ulcers at the right greater than left lower anterior gum Resp: Decreased breath sounds throughout the right chest, no respiratory distress at rest Cardio: Irregular GI: No hepatosplenomegaly Vascular: No leg edema  Skin: 2 cm erythematous mobile round cutaneous lesion at the right anterior chest wall   Lab Results:  Lab Results  Component Value Date   WBC 16.1 (H) 12/30/2021   HGB 10.0 (L) 12/30/2021   HCT 33.2 (L) 12/30/2021   MCV 97.4 12/30/2021   PLT 332 12/30/2021   NEUTROABS 13.3 (H) 12/30/2021    CMP  Lab Results  Component Value Date   NA 139 12/09/2021   K 4.3 12/09/2021   CL 98 12/09/2021   CO2 33 (H) 12/09/2021   GLUCOSE 103 (H) 12/09/2021   BUN 22 12/09/2021   CREATININE 0.93 12/09/2021   CALCIUM 9.8 12/09/2021   PROT 7.3 12/09/2021   ALBUMIN 3.7 12/09/2021   AST 17 12/09/2021   ALT 9 12/09/2021   ALKPHOS 90 12/09/2021   BILITOT 0.4 12/09/2021   GFRNONAA >60 12/09/2021   GFRAA 46 (L) 05/08/2020    Medications: I have reviewed the patient's current medications.   Assessment/Plan: Metastatic non-small cell lung cancer  large right pleural effusion, right lung mass, right pleural-based masses, left lung  nodules Right thoracentesis 09/04/2018-negative cytology CT biopsy of right lateral pleural mass 09/07/2018- poorly differentiated non-small cell carcinoma, malignant cells positive for cytokeratin AE1/AE3 and cytokeratin 8/18, histology favoring adenocarcinoma, MSS, tumor mutation burden 5.no EGFR, ALK, BRAF alteration.  PDL 1 rearrangement PDL 1 tumor proportion score-100%  Cycle 1 Alimta/carboplatin 09/22/2018, pembrolizumab 09/28/2018 Cycle 2 Alimta/carboplatin/pembrolizumab 10/15/2018 Cycle 3 Alimta/carboplatin/pembrolizumab 11/06/2018 Cycle 4 Alimta/carboplatin/pembrolizumab 11/28/2018 CT chest 12/14/2018- response to therapy of lung primary/primaries, thoracic nodal and pulmonary metastasis.  Decrease in right pleural effusion with improvement in pleural-based metastasis.  New T10 heterogeneous sclerotic density likely due to healing of previously CT occult metastasis. Pembrolizumab 12/19/2018 Pembrolizumab/Alimta starting 01/09/2019 Pembrolizumab/Alimta 01/31/2019 Pembrolizumab/Alimta 02/21/2019 Pembrolizumab/Alimta 03/14/2019 CT 04/01/2019- no evidence of disease progression, small loculated right pleural effusion and right basilar pleural nodularity-stable, stable left lower lobe nodule, persistent sclerosis at T10 Pembrolizumab/Alimta 04/05/2019 Pembrolizumab 05/07/2019 Pembrolizumab 05/28/2019 Pembrolizumab 06/18/2019 Pembrolizumab 07/09/2019 Chest CT 07/26/2019-moderate to large stable exudative right effusion with considerable pleural thickening.  Nodularity at the right lung base along pleural thickening similar to prior.  Stable left paramediastinal groundglass density nodule.  Stable sclerotic lesion posteriorly at the T10 vertebral level. Pembrolizumab 07/30/2019 Pembrolizumab 08/21/2019-changed to every 6-week dosing Pembrolizumab 10/11/2019 Pembrolizumab 11/22/2019 CT chest 01/03/2020-chronic appearing loculated effusion right chest with signs of pleural thickening smaller than on prior exam;  similar appearance of tree-in-bud bud nodularity in the right upper lobe  and left lung apex along with bronchiectatic changes in the lingula and middle lobe; similar appearance of sclerosis T10 vertebral body Pembrolizumab 01/03/2020 Pembrolizumab 02/14/2020 Pembrolizumab 03/27/2020 Chest CT 05/06/2020-unchanged posttreatment appearance of the chest with a chronic, loculated moderate right pleural effusion with associated atelectasis or consolidation and pleural thickening.  Stable small pulmonary nodules measuring 3 mm or smaller.  Pembrolizumab 05/08/2020 Pembrolizumab 06/19/2020 Pembrolizumab 08/03/2020 Pembrolizumab 09/14/2020 CT chest 11/04/2020-persistent and enlarging complex loculated right pleural fluid collection.  No findings suspicious for recurrent tumor, mediastinal/hilar adenopathy or pulmonary metastatic disease. CT chest 02/04/2021-chronic loculated right pleural effusion, new ill-defined right upper lobe nodule-nonspecific, coronary and aortic valve calcifications  (next scan planned at a 73-monthinterval) CT chest 05/21/2021-substantial increase peripheral airspace opacity in the right upper lobe and superior segment right lower lobe.  Appearance suspicious for multi lobar pneumonia.  Stable thick-walled chronic large right pleural effusion.  New part solid 1.2 cm superior segment left lower lobe nodule.  Stable left upper lobe groundglass density pulmonary nodule.  Increased bandlike nodular opacity laterally in the left lower lobe. 10-day course of Levaquin   Chest x-ray 06/09/2021-new patchy airspace disease left upper lobe, right upper lobe airspace disease has minimally decreased but not resolved.  Stable loculated right pleural effusion Thoracentesis 07/06/2021-350 cc of thick bloody fluid removed.  Cytology with atypical cells present.  Culture with no growth. CT chest 07/26/2021-progressive ill-defined nodular airspace process in the left lower lobe with hazy surrounding groundglass  opacity.  Significant progression of ill-defined groundglass opacity and slight interstitial thickening left upper lobe.  Improved appearance right upper lobe.  Stable thick-walled loculated right pleural fluid collection.  New small left pleural effusion.  Stable underlying lung disease.  No mediastinal or hilar mass or adenopathy. Pembrolizumab 07/30/2021 Pembrolizumab 08/20/2021 CT chest 08/26/2021-new left upper lobe airspace disease with interlobar septal thickening, new airspace disease in the superior segment of the left lower lobe, minimal patchy groundglass opacities in the right upper lobe, increased left pleural effusion, new prominent prevascular node Antibiotics and steroid taper with clinical improvement Bronchoscopy with biopsies and bronchoalveolar lavage from the left upper lobe 08/30/2021-culture negative, transbronchial biopsy and brushing negative for malignancy MRI lumbar spine 11/01/2021-5.1 cm tumor extending from the posterior L4 body into the spinal canal and caudally infiltrating the posterior L4-L5 disc and L5 superior endplate.  Subsequent severe malignant spinal lateral recess and left foraminal stenosis at L4-L5.  Query left L4 and/or L5 radiculitis.  Noncontiguous indeterminate 2.5 cm mass within the medial left psoas muscle at the L4 level with mild surrounding muscle edema. Radiation to lumbar spine at L4-5 and including mass in the left psoas muscle at the level of L4 11/08/2021 - 11/19/2021 PET 11/25/2021-extensive pleural at the right hemithorax with pleural lesions invading intercostal muscles and eroding adjacent ribs, small left pleural effusion, bilateral pulmonary metastases, osseous metastatic disease at L4/5 invading the spinal canal, destructive lesion of the left humeral head Cycle 1 docetaxel/ramucirumab 12/09/2021 Cycle 2 docetaxel/ramucirumab 12/30/2021, docetaxel dose reduced secondary to mucositis   2.  Severe anemia-likely secondary to bleeding in the right pleural  space and metastatic carcinoma, red cell transfusions 09/09/2018 09/10/2018, 09/23/2018-improved; progressive 04/26/2019, red cell transfusion.  Improved. 3.  Dyspnea secondary to the large right pleural effusion and anemia Right Pleurx catheter placed 09/11/2018 Chest x-ray 10/09/2018- large right pleural effusion, loculated Chest x-ray 11/06/2018- no interval change in the right-sided pleural effusion Pleurx drainage catheter removed 11/13/2018 4.  History of tobacco use 5.  History of kidney  stones 6.  Fever-most likely tumor fever, improved 7.  Admission 10/08/2018 with rapid atrial fibrillation/flutter- improved with diltiazem, started on Eliquis anticoagulation.  Eliquis discontinued.  Now on aspirin. 8.  Renal insufficiency 9.  Admission 08/26/2021 with increased dyspnea, CT with progressive left chest airspace disease, clinical improvement following antibiotics and steroids 10.  Left leg pain and weakness February 2023    Disposition: Brian Ramirez has metastatic non-small cell lung cancer.  He is completing salvage systemic therapy with docetaxel/ramucirumab.  He will complete cycle 2 today.  Cycle 1 was complicated by severe mucositis.  I dose reduced the dose of Taxol with cycle 2.  He will use Magic mouthwash and Orajel as needed.  He will return for an office visit and chemotherapy in 3 weeks.  We will refer him for a restaging chest CT if his performance status is worse when he is here in 3 weeks.  Betsy Coder, MD  12/30/2021  9:53 AM

## 2021-12-30 NOTE — Patient Instructions (Addendum)
Appointment 12/31/2021 at 2:30 for injection at [facility] What is this medication? RAMUCIRUMAB (ra mue SIR ue mab) is a monoclonal antibody. It is used to treat stomach cancer, colorectal cancer, liver cancer, and lung cancer. This medicine may be used for other purposes; ask your health care provider or pharmacist if you have questions. COMMON BRAND NAME(S): Cyramza What should I tell my care team before I take this medication? They need to know if you have any of these conditions: bleeding disorders blood clots heart disease, including heart failure, heart attack, or chest pain (angina) high blood pressure infection (especially a virus infection such as chickenpox, cold sores, or herpes) protein in your urine recent or planning to have surgery stroke an unusual or allergic reaction to ramucirumab, other medicines, foods, dyes, or preservatives pregnant or trying to get pregnant breast-feeding How should I use this medication? This medicine is for infusion into a vein. It is given by a health care professional in a hospital or clinic setting. Talk to your pediatrician regarding the use of this medicine in children. Special care may be needed. Overdosage: If you think you have taken too much of this medicine contact a poison control center or emergency room at once. NOTE: This medicine is only for you. Do not share this medicine with others. What if I miss a dose? It is important not to miss your dose. Call your doctor or health care professional if you are unable to keep an appointment. What may interact with this medication? Interactions have not been studied. This list may not describe all possible interactions. Give your health care provider a list of all the medicines, herbs, non-prescription drugs, or dietary supplements you use. Also tell them if you smoke, drink alcohol, or use illegal drugs. Some items may interact with your medicine. What should I  watch for while using this medication? Your condition will be monitored carefully while you are receiving this medicine. You will need to to check your blood pressure and have your blood and urine tested while you are taking this medicine. Your condition will be monitored carefully while you are receiving this medicine. This medicine may increase your risk to bruise or bleed. Call your doctor or health care professional if you notice any unusual bleeding. Before having surgery, talk to your health care provider to make sure it is ok. This drug can increase the risk of poor healing of your surgical site or wound. You will need to stop this drug for 28 days before surgery. After surgery, wait at least 2 weeks before restarting this drug. Make sure the surgical site or wound is healed enough before restarting this drug. Talk to your health care provider if questions. Do not become pregnant while taking this medicine or for 3 months after stopping it. Women should inform their doctor if they wish to become pregnant or think they might be pregnant. There is a potential for serious side effects to an unborn child. Talk to your health care professional or pharmacist for more information. Do not breast-feed an infant while taking this medicine or for 2 months after stopping it. This medicine may interfere with the ability to have a child. Talk with your doctor or health care professional if you are concerned about your fertility. What side effects may I notice from receiving this medication? Side effects that you should report to your doctor or health care professional as soon as possible: allergic reactions like skin rash, itching or hives,  breathing problems, swelling of the face, lips, or tongue signs of infection - fever or chills, cough, sore throat chest pain or chest tightness confusion dizziness feeling faint or lightheaded, falls severe abdominal pain severe nausea, vomiting signs and symptoms of  bleeding such as bloody or black, tarry stools; red or dark-brown urine; spitting up blood or brown material that looks like coffee grounds; red spots on the skin; unusual bruising or bleeding from the eye, gums, or nose signs and symptoms of a blood clot such as breathing problems; changes in vision; chest pain; severe, sudden headache; pain, swelling, warmth in the leg; trouble speaking; sudden numbness or weakness of the face, arm or leg symptoms of a stroke: change in mental awareness, inability to talk or move one side of the body trouble walking, dizziness, loss of balance or coordination Side effects that usually do not require medical attention (report to your doctor or health care professional if they continue or are bothersome): cold, clammy skin constipation diarrhea headache nausea, vomiting stomach pain unusually slow heartbeat unusually weak or tired This list may not describe all possible side effects. Call your doctor for medical advice about side effects. You may report side effects to FDA at 1-800-FDA-1088. Where should I keep my medication? This drug is given in a hospital or clinic and will not be stored at home. NOTE: This sheet is a summary. It may not cover all possible information. If you have questions about this medicine, talk to your doctor, pharmacist, or health care provider.  2023 Elsevier/Gold Standard (2021-07-02 00:00:00)     Docetaxel injection What is this medication? DOCETAXEL (doe se TAX el) is a chemotherapy drug. It targets fast dividing cells, like cancer cells, and causes these cells to die. This medicine is used to treat many types of cancers like breast cancer, certain stomach cancers, head and neck cancer, lung cancer, and prostate cancer. This medicine may be used for other purposes; ask your health care provider or pharmacist if you have questions. COMMON BRAND NAME(S): Docefrez, Taxotere What should I tell my care team before I take this  medication? They need to know if you have any of these conditions: infection (especially a virus infection such as chickenpox, cold sores, or herpes) liver disease low blood counts, like low white cell, platelet, or red cell counts an unusual or allergic reaction to docetaxel, polysorbate 80, other chemotherapy agents, other medicines, foods, dyes, or preservatives pregnant or trying to get pregnant breast-feeding How should I use this medication? This drug is given as an infusion into a vein. It is administered in a hospital or clinic by a specially trained health care professional. Talk to your pediatrician regarding the use of this medicine in children. Special care may be needed. Overdosage: If you think you have taken too much of this medicine contact a poison control center or emergency room at once. NOTE: This medicine is only for you. Do not share this medicine with others. What if I miss a dose? It is important not to miss your dose. Call your doctor or health care professional if you are unable to keep an appointment. What may interact with this medication? Do not take this medicine with any of the following medications: live virus vaccines This medicine may also interact with the following medications: aprepitant certain antibiotics like erythromycin or clarithromycin certain antivirals for HIV or hepatitis certain medicines for fungal infections like fluconazole, itraconazole, ketoconazole, posaconazole, or voriconazole cimetidine ciprofloxacin conivaptan cyclosporine dronedarone fluvoxamine grapefruit juice imatinib  verapamil This list may not describe all possible interactions. Give your health care provider a list of all the medicines, herbs, non-prescription drugs, or dietary supplements you use. Also tell them if you smoke, drink alcohol, or use illegal drugs. Some items may interact with your medicine. What should I watch for while using this medication? Your  condition will be monitored carefully while you are receiving this medicine. You will need important blood work done while you are taking this medicine. Call your doctor or health care professional for advice if you get a fever, chills or sore throat, or other symptoms of a cold or flu. Do not treat yourself. This drug decreases your body's ability to fight infections. Try to avoid being around people who are sick. Some products may contain alcohol. Ask your health care professional if this medicine contains alcohol. Be sure to tell all health care professionals you are taking this medicine. Certain medicines, like metronidazole and disulfiram, can cause an unpleasant reaction when taken with alcohol. The reaction includes flushing, headache, nausea, vomiting, sweating, and increased thirst. The reaction can last from 30 minutes to several hours. You may get drowsy or dizzy. Do not drive, use machinery, or do anything that needs mental alertness until you know how this medicine affects you. Do not stand or sit up quickly, especially if you are an older patient. This reduces the risk of dizzy or fainting spells. Alcohol may interfere with the effect of this medicine. Talk to your health care professional about your risk of cancer. You may be more at risk for certain types of cancer if you take this medicine. Do not become pregnant while taking this medicine or for 6 months after stopping it. Women should inform their doctor if they wish to become pregnant or think they might be pregnant. There is a potential for serious side effects to an unborn child. Talk to your health care professional or pharmacist for more information. Do not breast-feed an infant while taking this medicine or for 1 week after stopping it. Males who get this medicine must use a condom during sex with females who can get pregnant. If you get a woman pregnant, the baby could have birth defects. The baby could die before they are born. You  will need to continue wearing a condom for 3 months after stopping the medicine. Tell your health care provider right away if your partner becomes pregnant while you are taking this medicine. This may interfere with the ability to father a child. You should talk to your doctor or health care professional if you are concerned about your fertility. What side effects may I notice from receiving this medication? Side effects that you should report to your doctor or health care professional as soon as possible: allergic reactions like skin rash, itching or hives, swelling of the face, lips, or tongue blurred vision breathing problems changes in vision low blood counts - This drug may decrease the number of white blood cells, red blood cells and platelets. You may be at increased risk for infections and bleeding. nausea and vomiting pain, redness or irritation at site where injected pain, tingling, numbness in the hands or feet redness, blistering, peeling, or loosening of the skin, including inside the mouth signs of decreased platelets or bleeding - bruising, pinpoint red spots on the skin, black, tarry stools, nosebleeds signs of decreased red blood cells - unusually weak or tired, fainting spells, lightheadedness signs of infection - fever or chills, cough, sore throat, pain  or difficulty passing urine swelling of the ankle, feet, hands Side effects that usually do not require medical attention (report to your doctor or health care professional if they continue or are bothersome): constipation diarrhea fingernail or toenail changes hair loss loss of appetite mouth sores muscle pain This list may not describe all possible side effects. Call your doctor for medical advice about side effects. You may report side effects to FDA at 1-800-FDA-1088. Where should I keep my medication? This drug is given in a hospital or clinic and will not be stored at home. NOTE: This sheet is a summary. It may  not cover all possible information. If you have questions about this medicine, talk to your doctor, pharmacist, or health care provider.  2023 Elsevier/Gold Standard (2021-07-02 00:00:00)

## 2021-12-30 NOTE — Progress Notes (Signed)
Patient presents for treatment. RN assessment completed along with the following:  Labs/vitals reviewed - Yes, and within treatment parameters.   Weight within 10% of previous measurement - Yes Informed consent completed and reflects current therapy/intent - Yes, on date 12/09/2021             Provider progress note reviewed - Yes, today's provider note was reviewed. Treatment/Antibody/Supportive plan reviewed - Yes, and dose reduction on Taxotere S&H and other orders reviewed - Yes, and there are no additional orders identified. Previous treatment date reviewed - Yes, and the appropriate amount of time has elapsed between treatments. Clinic Hand Off Received from - Abigail Butts  Patient to proceed with treatment.

## 2021-12-31 ENCOUNTER — Inpatient Hospital Stay: Payer: Medicare Other

## 2021-12-31 VITALS — BP 126/84 | HR 89 | Temp 97.6°F | Resp 20

## 2021-12-31 DIAGNOSIS — Z5112 Encounter for antineoplastic immunotherapy: Secondary | ICD-10-CM | POA: Diagnosis not present

## 2021-12-31 DIAGNOSIS — C3490 Malignant neoplasm of unspecified part of unspecified bronchus or lung: Secondary | ICD-10-CM

## 2021-12-31 DIAGNOSIS — C782 Secondary malignant neoplasm of pleura: Secondary | ICD-10-CM | POA: Diagnosis not present

## 2021-12-31 DIAGNOSIS — C3491 Malignant neoplasm of unspecified part of right bronchus or lung: Secondary | ICD-10-CM | POA: Diagnosis not present

## 2021-12-31 DIAGNOSIS — Z5111 Encounter for antineoplastic chemotherapy: Secondary | ICD-10-CM | POA: Diagnosis not present

## 2021-12-31 DIAGNOSIS — C7951 Secondary malignant neoplasm of bone: Secondary | ICD-10-CM | POA: Diagnosis not present

## 2021-12-31 DIAGNOSIS — Z5189 Encounter for other specified aftercare: Secondary | ICD-10-CM | POA: Diagnosis not present

## 2021-12-31 MED ORDER — PEGFILGRASTIM-CBQV 6 MG/0.6ML ~~LOC~~ SOSY
6.0000 mg | PREFILLED_SYRINGE | Freq: Once | SUBCUTANEOUS | Status: AC
  Administered 2021-12-31: 6 mg via SUBCUTANEOUS
  Filled 2021-12-31: qty 0.6

## 2021-12-31 NOTE — Patient Instructions (Signed)

## 2022-01-05 ENCOUNTER — Encounter: Payer: Medicare Other | Admitting: Dietician

## 2022-01-05 ENCOUNTER — Telehealth: Payer: Self-pay | Admitting: Dietician

## 2022-01-05 NOTE — Telephone Encounter (Signed)
Nutrition Follow Up:   ASSESSMENT: Called patient at home # which is also his cell. He just completed second cycle of docetaxel/ramucirumab last week. He reports very fatigued, and now has mouth sores. He is using MMW and salt and water rinses. Weight is up six pounds since last nutrition call. He reports he never got the nutrition tips I emailed him. Verified email address. He uses a Water engineer.     Nutrition Focused Physical Exam:  Unable to perform NFPE     Medications: States taking prednisone but that has not increased his appetite     Labs: 12/30/21 reviewed     Anthropometrics:   Increased past 2 months, prior weight loss 12# (8.4%) past 6 weeks     Height: 73" Weight:  12/30/21   136.8#  12/09/21   134.4# 12/02/21   130# 11/18/21    133.5# 11/03/21   137# 10/18/21     142.4#   UBW: 175 3 years ago, been fluctuating since BMI: 17.15     Estimated Energy Needs   Kcals: 1800-2100 Protein: 72-90 g Fluid: >/= 2100 mL     NUTRITION DIAGNOSIS: Inadequate PO intake calories r/t cancer treatment side effect of fatigue AEB significant weight loss and usual intake. Intake and weight have improved.     MALNUTRITION DIAGNOSIS: Suspect Severe malnutrition of chronic disease with significant weight loss and reports of loss of muscle and strength.     INTERVENTION: Encouraged continued use of High calorie options and convenience foods when fatigue inhibit food prep. Capitalizing on the weeks before his treatments to boost intake and prep for the post treatment week when he knows he will sleep more often. Reminded him that goal weight gain 2-4 pounds per month is enough to help regain lean body mass. Will re-send emailed Nutrition tips for fatigue and high calorie snacks and add on tips for mouth sores. Encouraged patient to text me if he does not receive email today.     MONITORING, EVALUATION, GOAL: PO intake, weight trends, labs, and nutrition impact symptoms.     Next Visit:   PRN at patient or provider request   April Manson, RDN, LDN Registered Dietitian, [facility] Part Time Remote (Usual office hours: Tuesday-Thursday) Cell: [phone]

## 2022-01-14 ENCOUNTER — Telehealth: Payer: Self-pay | Admitting: Pulmonary Disease

## 2022-01-14 NOTE — Telephone Encounter (Signed)
Called and spoke with Jeneen Rinks from [place] who is also a family friend of the patient who is calling stating that patient reached out to him stating that he was on a NIV/BIPAP when he was in the hospital beginning of year and how great he felt on it and would like to look into getting one. Jeneen Rinks says that he dropped off folder of paperwork for Dr. Valeta Harms to look at and sign if he is willing. His direct number is [phone] if there are any questions.  Dr. Valeta Harms please advise

## 2022-01-16 ENCOUNTER — Other Ambulatory Visit: Payer: Self-pay | Admitting: Oncology

## 2022-01-20 ENCOUNTER — Encounter: Payer: Self-pay | Admitting: *Deleted

## 2022-01-20 ENCOUNTER — Ambulatory Visit (HOSPITAL_BASED_OUTPATIENT_CLINIC_OR_DEPARTMENT_OTHER)
Admission: RE | Admit: 2022-01-20 | Discharge: 2022-01-20 | Disposition: A | Discharge status: Home / Self Care | Payer: Medicare Other | Source: Ambulatory Visit | Attending: Nurse Practitioner | Admitting: Nurse Practitioner

## 2022-01-20 ENCOUNTER — Encounter (HOSPITAL_BASED_OUTPATIENT_CLINIC_OR_DEPARTMENT_OTHER): Payer: Self-pay

## 2022-01-20 ENCOUNTER — Encounter: Payer: Self-pay | Admitting: Nurse Practitioner

## 2022-01-20 ENCOUNTER — Inpatient Hospital Stay: Payer: Medicare Other

## 2022-01-20 ENCOUNTER — Inpatient Hospital Stay (HOSPITAL_BASED_OUTPATIENT_CLINIC_OR_DEPARTMENT_OTHER): Payer: Medicare Other | Admitting: Nurse Practitioner

## 2022-01-20 ENCOUNTER — Other Ambulatory Visit: Payer: Self-pay

## 2022-01-20 ENCOUNTER — Inpatient Hospital Stay: Payer: Medicare Other | Attending: Nurse Practitioner

## 2022-01-20 VITALS — BP 114/82 | HR 87 | Temp 98.1°F | Resp 20 | Wt 131.6 lb

## 2022-01-20 DIAGNOSIS — C3490 Malignant neoplasm of unspecified part of unspecified bronchus or lung: Secondary | ICD-10-CM | POA: Diagnosis not present

## 2022-01-20 DIAGNOSIS — J90 Pleural effusion, not elsewhere classified: Secondary | ICD-10-CM | POA: Diagnosis not present

## 2022-01-20 DIAGNOSIS — C7951 Secondary malignant neoplasm of bone: Secondary | ICD-10-CM

## 2022-01-20 DIAGNOSIS — C782 Secondary malignant neoplasm of pleura: Secondary | ICD-10-CM | POA: Insufficient documentation

## 2022-01-20 DIAGNOSIS — C3491 Malignant neoplasm of unspecified part of right bronchus or lung: Secondary | ICD-10-CM | POA: Insufficient documentation

## 2022-01-20 DIAGNOSIS — Z5111 Encounter for antineoplastic chemotherapy: Secondary | ICD-10-CM | POA: Insufficient documentation

## 2022-01-20 DIAGNOSIS — R918 Other nonspecific abnormal finding of lung field: Secondary | ICD-10-CM | POA: Diagnosis not present

## 2022-01-20 LAB — CBC WITH DIFFERENTIAL (CANCER CENTER ONLY)
Abs Immature Granulocytes: 0.09 10*3/uL — ABNORMAL HIGH (ref 0.00–0.07)
Basophils Absolute: 0.1 10*3/uL (ref 0.0–0.1)
Basophils Relative: 0 %
Eosinophils Absolute: 0.1 10*3/uL (ref 0.0–0.5)
Eosinophils Relative: 1 %
HCT: 35.3 % — ABNORMAL LOW (ref 39.0–52.0)
Hemoglobin: 10.7 g/dL — ABNORMAL LOW (ref 13.0–17.0)
Immature Granulocytes: 0 %
Lymphocytes Relative: 4 %
Lymphs Abs: 0.8 10*3/uL (ref 0.7–4.0)
MCH: 29.2 pg (ref 26.0–34.0)
MCHC: 30.3 g/dL (ref 30.0–36.0)
MCV: 96.4 fL (ref 80.0–100.0)
Monocytes Absolute: 1.6 10*3/uL — ABNORMAL HIGH (ref 0.1–1.0)
Monocytes Relative: 8 %
Neutro Abs: 17.6 10*3/uL — ABNORMAL HIGH (ref 1.7–7.7)
Neutrophils Relative %: 87 %
Platelet Count: 281 10*3/uL (ref 150–400)
RBC: 3.66 MIL/uL — ABNORMAL LOW (ref 4.22–5.81)
RDW: 14.9 % (ref 11.5–15.5)
WBC Count: 20.2 10*3/uL — ABNORMAL HIGH (ref 4.0–10.5)
nRBC: 0 % (ref 0.0–0.2)

## 2022-01-20 LAB — CMP (CANCER CENTER ONLY)
ALT: 10 U/L (ref 0–44)
AST: 17 U/L (ref 15–41)
Albumin: 3.8 g/dL (ref 3.5–5.0)
Alkaline Phosphatase: 115 U/L (ref 38–126)
Anion gap: 12 (ref 5–15)
BUN: 23 mg/dL (ref 8–23)
CO2: 30 mmol/L (ref 22–32)
Calcium: 10.1 mg/dL (ref 8.9–10.3)
Chloride: 97 mmol/L — ABNORMAL LOW (ref 98–111)
Creatinine: 1.03 mg/dL (ref 0.61–1.24)
GFR, Estimated: >60 mL/min (ref >60)
Glucose, Bld: 134 mg/dL — ABNORMAL HIGH (ref 70–99)
Potassium: 4.3 mmol/L (ref 3.5–5.1)
Sodium: 139 mmol/L (ref 135–145)
Total Bilirubin: 0.4 mg/dL (ref 0.3–1.2)
Total Protein: 7.6 g/dL (ref 6.5–8.1)

## 2022-01-20 MED ORDER — OXYCODONE-ACETAMINOPHEN 5-325 MG PO TABS: 1.0000 | ORAL_TABLET | Freq: Four times a day (QID) | ORAL | 0 refills | Status: DC | PRN

## 2022-01-20 NOTE — Telephone Encounter (Signed)
Calling to check on the status of pt order he can be reached @ [phone].Hillery Hunter

## 2022-01-20 NOTE — Progress Notes (Signed)
Patient seen by Ned Card NP today  Vitals are within treatment parameters.  Labs reviewed by Ned Card NP and are within treatment parameters.  Per physician team, patient will not be receiving treatment today.

## 2022-01-20 NOTE — Progress Notes (Signed)
DISCONTINUE ON PATHWAY REGIMEN - Non-Small Cell Lung     A cycle is every 21 days:     Ramucirumab      Docetaxel   **Always confirm dose/schedule in your pharmacy ordering system**  REASON: Disease Progression PRIOR TREATMENT: [id number]: Docetaxel 75 mg/m2 + Ramucirumab 10 mg/kg q21 Days Until Progression or Unacceptable Toxicity TREATMENT RESPONSE: Progressive Disease (PD)  START OFF PATHWAY REGIMEN - Non-Small Cell Lung   [id number]:Carboplatin + Gemcitabine (5/1,000) q21 Days:   A cycle is every 21 days:     Carboplatin      Gemcitabine   **Always confirm dose/schedule in your pharmacy ordering system**  Patient Characteristics: Stage IV Metastatic, Nonsquamous, Molecular Analysis Completed, Molecular Alteration Present and Targeted Therapy Exhausted OR EGFR Exon 20+ or KRAS G12C+ or HER2+ Present and No Prior Chemo/Immunotherapy OR No Alteration Present, Second Line -  Chemotherapy/Immunotherapy, PS = 0, 1, No Prior PD-1/PD-L1  Inhibitor or Prior PD-1/PD-L1 Inhibitor + Chemotherapy, and Not a Candidate for Immunotherapy Therapeutic Status: Stage IV Metastatic Histology: Nonsquamous Cell Broad Molecular Profiling Status: Engineer, manufacturing Analysis Results: No Alteration Present ECOG Performance Status: 1 Chemotherapy/Immunotherapy Line of Therapy: Second Line Chemotherapy/Immunotherapy Immunotherapy Candidate Status: Not a Candidate for Immunotherapy Prior Immunotherapy Status: Prior PD-1/PD-L1 Inhibitor + Chemotherapy Intent of Therapy: Non-Curative / Palliative Intent, Discussed with Patient

## 2022-01-20 NOTE — Progress Notes (Signed)
[place] OFFICE PROGRESS NOTE   Diagnosis: Non-small cell lung cancer  INTERVAL HISTORY:   Brian Ramirez returns as scheduled.  He completed cycle 2 Taxotere/ramucirumab 12/30/2021.  He had nausea for a few days after treatment.  He again developed mouth sores.  He is able to eat and drink.  He has "right lung pain" affecting sleep.  He thinks he has a new mass on the right chest wall.  Objective:  Vital signs in last 24 hours:  Blood pressure 114/82, pulse 87, temperature 98.1 F (36.7 C), temperature source Tympanic, resp. rate 20, weight 131 lb 9.6 oz (59.7 kg), SpO2 98 %.    HEENT: Small ulceration left lower anterior gum. Resp: Diminished breath sounds right lung field.  Increased respiratory rate. Cardio: Irregular. GI: No hepatomegaly. Vascular: No leg edema. Skin: Erythematous purplish cutaneous lesion right anterior chest wall.  He appears to have new lesions medially and laterally.   Lab Results:  Lab Results  Component Value Date   WBC 20.2 (H) 01/20/2022   HGB 10.7 (L) 01/20/2022   HCT 35.3 (L) 01/20/2022   MCV 96.4 01/20/2022   PLT 281 01/20/2022   NEUTROABS 17.6 (H) 01/20/2022    Imaging:  No results found.  Medications: I have reviewed the patient's current medications.  Assessment/Plan: Metastatic non-small cell lung cancer  large right pleural effusion, right lung mass, right pleural-based masses, left lung nodules Right thoracentesis 09/04/2018-negative cytology CT biopsy of right lateral pleural mass 09/07/2018- poorly differentiated non-small cell carcinoma, malignant cells positive for cytokeratin AE1/AE3 and cytokeratin 8/18, histology favoring adenocarcinoma, MSS, tumor mutation burden 5.no EGFR, ALK, BRAF alteration.  PDL 1 rearrangement PDL 1 tumor proportion score-100%  Cycle 1 Alimta/carboplatin 09/22/2018, pembrolizumab 09/28/2018 Cycle 2 Alimta/carboplatin/pembrolizumab 10/15/2018 Cycle 3 Alimta/carboplatin/pembrolizumab  11/06/2018 Cycle 4 Alimta/carboplatin/pembrolizumab 11/28/2018 CT chest 12/14/2018- response to therapy of lung primary/primaries, thoracic nodal and pulmonary metastasis.  Decrease in right pleural effusion with improvement in pleural-based metastasis.  New T10 heterogeneous sclerotic density likely due to healing of previously CT occult metastasis. Pembrolizumab 12/19/2018 Pembrolizumab/Alimta starting 01/09/2019 Pembrolizumab/Alimta 01/31/2019 Pembrolizumab/Alimta 02/21/2019 Pembrolizumab/Alimta 03/14/2019 CT 04/01/2019- no evidence of disease progression, small loculated right pleural effusion and right basilar pleural nodularity-stable, stable left lower lobe nodule, persistent sclerosis at T10 Pembrolizumab/Alimta 04/05/2019 Pembrolizumab 05/07/2019 Pembrolizumab 05/28/2019 Pembrolizumab 06/18/2019 Pembrolizumab 07/09/2019 Chest CT 07/26/2019-moderate to large stable exudative right effusion with considerable pleural thickening.  Nodularity at the right lung base along pleural thickening similar to prior.  Stable left paramediastinal groundglass density nodule.  Stable sclerotic lesion posteriorly at the T10 vertebral level. Pembrolizumab 07/30/2019 Pembrolizumab 08/21/2019-changed to every 6-week dosing Pembrolizumab 10/11/2019 Pembrolizumab 11/22/2019 CT chest 01/03/2020-chronic appearing loculated effusion right chest with signs of pleural thickening smaller than on prior exam; similar appearance of tree-in-bud bud nodularity in the right upper lobe and left lung apex along with bronchiectatic changes in the lingula and middle lobe; similar appearance of sclerosis T10 vertebral body Pembrolizumab 01/03/2020 Pembrolizumab 02/14/2020 Pembrolizumab 03/27/2020 Chest CT 05/06/2020-unchanged posttreatment appearance of the chest with a chronic, loculated moderate right pleural effusion with associated atelectasis or consolidation and pleural thickening.  Stable small pulmonary nodules measuring 3 mm or smaller.   Pembrolizumab 05/08/2020 Pembrolizumab 06/19/2020 Pembrolizumab 08/03/2020 Pembrolizumab 09/14/2020 CT chest 11/04/2020-persistent and enlarging complex loculated right pleural fluid collection.  No findings suspicious for recurrent tumor, mediastinal/hilar adenopathy or pulmonary metastatic disease. CT chest 02/04/2021-chronic loculated right pleural effusion, new ill-defined right upper lobe nodule-nonspecific, coronary and aortic valve calcifications  (next scan planned at  a 32-monthinterval) CT chest 05/21/2021-substantial increase peripheral airspace opacity in the right upper lobe and superior segment right lower lobe.  Appearance suspicious for multi lobar pneumonia.  Stable thick-walled chronic large right pleural effusion.  New part solid 1.2 cm superior segment left lower lobe nodule.  Stable left upper lobe groundglass density pulmonary nodule.  Increased bandlike nodular opacity laterally in the left lower lobe. 10-day course of Levaquin   Chest x-ray 06/09/2021-new patchy airspace disease left upper lobe, right upper lobe airspace disease has minimally decreased but not resolved.  Stable loculated right pleural effusion Thoracentesis 07/06/2021-350 cc of thick bloody fluid removed.  Cytology with atypical cells present.  Culture with no growth. CT chest 07/26/2021-progressive ill-defined nodular airspace process in the left lower lobe with hazy surrounding groundglass opacity.  Significant progression of ill-defined groundglass opacity and slight interstitial thickening left upper lobe.  Improved appearance right upper lobe.  Stable thick-walled loculated right pleural fluid collection.  New small left pleural effusion.  Stable underlying lung disease.  No mediastinal or hilar mass or adenopathy. Pembrolizumab 07/30/2021 Pembrolizumab 08/20/2021 CT chest 08/26/2021-new left upper lobe airspace disease with interlobar septal thickening, new airspace disease in the superior segment of the left lower  lobe, minimal patchy groundglass opacities in the right upper lobe, increased left pleural effusion, new prominent prevascular node Antibiotics and steroid taper with clinical improvement Bronchoscopy with biopsies and bronchoalveolar lavage from the left upper lobe 08/30/2021-culture negative, transbronchial biopsy and brushing negative for malignancy MRI lumbar spine 11/01/2021-5.1 cm tumor extending from the posterior L4 body into the spinal canal and caudally infiltrating the posterior L4-L5 disc and L5 superior endplate.  Subsequent severe malignant spinal lateral recess and left foraminal stenosis at L4-L5.  Query left L4 and/or L5 radiculitis.  Noncontiguous indeterminate 2.5 cm mass within the medial left psoas muscle at the L4 level with mild surrounding muscle edema. Radiation to lumbar spine at L4-5 and including mass in the left psoas muscle at the level of L4 11/08/2021 - 11/19/2021 PET 11/25/2021-extensive pleural at the right hemithorax with pleural lesions invading intercostal muscles and eroding adjacent ribs, small left pleural effusion, bilateral pulmonary metastases, osseous metastatic disease at L4/5 invading the spinal canal, destructive lesion of the left humeral head Cycle 1 docetaxel/ramucirumab 12/09/2021 Cycle 2 docetaxel/ramucirumab 12/30/2021, docetaxel dose reduced secondary to mucositis    2.  Severe anemia-likely secondary to bleeding in the right pleural space and metastatic carcinoma, red cell transfusions 09/09/2018 09/10/2018, 09/23/2018-improved; progressive 04/26/2019, red cell transfusion.  Improved. 3.  Dyspnea secondary to the large right pleural effusion and anemia Right Pleurx catheter placed 09/11/2018 Chest x-ray 10/09/2018- large right pleural effusion, loculated Chest x-ray 11/06/2018- no interval change in the right-sided pleural effusion Pleurx drainage catheter removed 11/13/2018 4.  History of tobacco use 5.  History of kidney stones 6.  Fever-most likely tumor  fever, improved 7.  Admission 10/08/2018 with rapid atrial fibrillation/flutter- improved with diltiazem, started on Eliquis anticoagulation.  Eliquis discontinued.  Now on aspirin. 8.  Renal insufficiency 9.  Admission 08/26/2021 with increased dyspnea, CT with progressive left chest airspace disease, clinical improvement following antibiotics and steroids 10.  Left leg pain and weakness February 2023  Disposition: Mr. KMurleyhas completed 2 cycles of docetaxel/ramucirumab.  His performance status has not improved.  He appears to have progression of the lesions at the right chest wall.  Docetaxel/ramucirumab discontinued.  We are referring him for a restaging chest CT.  Dr. SBenay Spicerecommends systemic therapy with carboplatin/gemcitabine.  We reviewed potential side effects.  He agrees to proceed.  He will return for cycle 1 carboplatin/gemcitabine 01/26/2022.  We will see him prior to cycle 2 02/16/2022.  Patient seen with Dr. [name].  Javyon Fontan ANP/GNP-BC   01/20/2022  11:08 AM This was a shared visit with Ned Card.  Brian Ramirez was interviewed and examined.  He has completed 2 cycles of docetaxel/ramucirumab.  There is clinical evidence of disease progression.  He will be referred for a restaging chest CT.  The plan is to change systemic therapy to carboplatin/gemcitabine.  We reviewed potential toxicities associated with this regimen including the chance of hematologic toxicity, and allergic reaction, pneumonitis, fever, and rash.  He agrees to proceed.  Chemotherapy plan was entered today.  I was present for greater than 50% of today's visit.  I performed medical decision making.  Julieanne Manson, MD

## 2022-01-21 ENCOUNTER — Inpatient Hospital Stay: Payer: Medicare Other

## 2022-01-21 NOTE — Telephone Encounter (Signed)
Hey ladies,   So it looks like the doctors office tried multiple times about the PT referral and  mailed a letter.   Do I need to place a new order for PT to be done or does this patient need to contact the doctors office since they tried contact patient and sent a letter.   Please advise

## 2022-01-23 ENCOUNTER — Other Ambulatory Visit: Payer: Self-pay | Admitting: Oncology

## 2022-01-24 NOTE — Telephone Encounter (Signed)
Brian Nash, DO to [place], [place], [place]  [facility] Triage Pool      01/24/22  8:15 AM   There is no documentation on who is calling? Is this the patient or the company's request? I am not completing any paperwork for any device unless the patient is seen in clinic.   We should be communicating only with the patient. Even though Jeneen Rinks from [employer] is a "family friend" we should not be communicating with him unless the patient specifically requests this and there is clear documentation.   If the patient calls for an appt please offer him an appt. Can see doc or APP in sleep clinic to discuss NIV/BIPAP management.   Thanks   BLI   Called the pt to discuss, no answer LMTCB.

## 2022-01-25 NOTE — Telephone Encounter (Signed)
ATC patient, LMTCB patient has been scheduled for OV Next Appt With Pulmonology Clayton Bibles, NP) 02/02/2022 at 2:00 PM

## 2022-01-26 ENCOUNTER — Inpatient Hospital Stay: Payer: Medicare Other | Admitting: Nutrition

## 2022-01-26 ENCOUNTER — Inpatient Hospital Stay: Payer: Medicare Other

## 2022-01-26 ENCOUNTER — Other Ambulatory Visit: Payer: Self-pay

## 2022-01-26 VITALS — BP 118/77 | HR 85 | Temp 98.2°F | Resp 22 | Ht 73.0 in | Wt 126.4 lb

## 2022-01-26 DIAGNOSIS — C7951 Secondary malignant neoplasm of bone: Secondary | ICD-10-CM

## 2022-01-26 DIAGNOSIS — C782 Secondary malignant neoplasm of pleura: Secondary | ICD-10-CM | POA: Diagnosis not present

## 2022-01-26 DIAGNOSIS — C3490 Malignant neoplasm of unspecified part of unspecified bronchus or lung: Secondary | ICD-10-CM

## 2022-01-26 DIAGNOSIS — C3491 Malignant neoplasm of unspecified part of right bronchus or lung: Secondary | ICD-10-CM | POA: Diagnosis not present

## 2022-01-26 DIAGNOSIS — Z5111 Encounter for antineoplastic chemotherapy: Secondary | ICD-10-CM | POA: Diagnosis not present

## 2022-01-26 LAB — CMP (CANCER CENTER ONLY)
ALT: 9 U/L (ref 0–44)
AST: 17 U/L (ref 15–41)
Albumin: 3.9 g/dL (ref 3.5–5.0)
Alkaline Phosphatase: 94 U/L (ref 38–126)
Anion gap: 13 (ref 5–15)
BUN: 21 mg/dL (ref 8–23)
CO2: 30 mmol/L (ref 22–32)
Calcium: 10.6 mg/dL — ABNORMAL HIGH (ref 8.9–10.3)
Chloride: 95 mmol/L — ABNORMAL LOW (ref 98–111)
Creatinine: 1.1 mg/dL (ref 0.61–1.24)
GFR, Estimated: >60 mL/min (ref >60)
Glucose, Bld: 136 mg/dL — ABNORMAL HIGH (ref 70–99)
Potassium: 4.5 mmol/L (ref 3.5–5.1)
Sodium: 138 mmol/L (ref 135–145)
Total Bilirubin: 0.5 mg/dL (ref 0.3–1.2)
Total Protein: 7.6 g/dL (ref 6.5–8.1)

## 2022-01-26 LAB — CBC WITH DIFFERENTIAL (CANCER CENTER ONLY)
Abs Immature Granulocytes: 0.06 10*3/uL (ref 0.00–0.07)
Basophils Absolute: 0.1 10*3/uL (ref 0.0–0.1)
Basophils Relative: 0 %
Eosinophils Absolute: 0.1 10*3/uL (ref 0.0–0.5)
Eosinophils Relative: 1 %
HCT: 37.8 % — ABNORMAL LOW (ref 39.0–52.0)
Hemoglobin: 11.5 g/dL — ABNORMAL LOW (ref 13.0–17.0)
Immature Granulocytes: 0 %
Lymphocytes Relative: 4 %
Lymphs Abs: 0.8 10*3/uL (ref 0.7–4.0)
MCH: 29.5 pg (ref 26.0–34.0)
MCHC: 30.4 g/dL (ref 30.0–36.0)
MCV: 96.9 fL (ref 80.0–100.0)
Monocytes Absolute: 1.3 10*3/uL — ABNORMAL HIGH (ref 0.1–1.0)
Monocytes Relative: 7 %
Neutro Abs: 16.7 10*3/uL — ABNORMAL HIGH (ref 1.7–7.7)
Neutrophils Relative %: 88 %
Platelet Count: 268 10*3/uL (ref 150–400)
RBC: 3.9 MIL/uL — ABNORMAL LOW (ref 4.22–5.81)
RDW: 15.1 % (ref 11.5–15.5)
WBC Count: 19 10*3/uL — ABNORMAL HIGH (ref 4.0–10.5)
nRBC: 0 % (ref 0.0–0.2)

## 2022-01-26 MED ORDER — SODIUM CHLORIDE 0.9 % IV SOLN
374.0000 mg | Freq: Once | INTRAVENOUS | Status: AC
  Administered 2022-01-26: 370 mg via INTRAVENOUS
  Filled 2022-01-26: qty 37

## 2022-01-26 MED ORDER — SODIUM CHLORIDE 0.9 % IV SOLN
10.0000 mg | Freq: Once | INTRAVENOUS | Status: AC
  Administered 2022-01-26: 10 mg via INTRAVENOUS
  Filled 2022-01-26: qty 1

## 2022-01-26 MED ORDER — PROCHLORPERAZINE MALEATE 10 MG PO TABS: 10.0000 mg | ORAL_TABLET | Freq: Four times a day (QID) | ORAL | 0 refills | Status: AC | PRN

## 2022-01-26 MED ORDER — SODIUM CHLORIDE 0.9 % IV SOLN
150.0000 mg | Freq: Once | INTRAVENOUS | Status: AC
  Administered 2022-01-26: 150 mg via INTRAVENOUS
  Filled 2022-01-26: qty 5

## 2022-01-26 MED ORDER — SODIUM CHLORIDE 0.9 % IV SOLN: Freq: Once | INTRAVENOUS | Status: AC

## 2022-01-26 MED ORDER — OXYCODONE HCL 5 MG PO TABS
5.0000 mg | ORAL_TABLET | Freq: Once | ORAL | Status: AC
  Administered 2022-01-26: 5 mg via ORAL
  Filled 2022-01-26: qty 1

## 2022-01-26 MED ORDER — SODIUM CHLORIDE 0.9 % IV SOLN
1000.0000 mg/m2 | Freq: Once | INTRAVENOUS | Status: AC
  Administered 2022-01-26: 1748 mg via INTRAVENOUS
  Filled 2022-01-26: qty 19.67

## 2022-01-26 MED ORDER — PALONOSETRON HCL INJECTION 0.25 MG/5ML
0.2500 mg | Freq: Once | INTRAVENOUS | Status: AC
  Administered 2022-01-26: 0.25 mg via INTRAVENOUS
  Filled 2022-01-26: qty 5

## 2022-01-26 NOTE — Progress Notes (Signed)
Leander Rams, NP notified of patients complaints of pain rated at 7. Order received.

## 2022-01-26 NOTE — Progress Notes (Signed)
Brief nutrition follow up completed with patient during infusion. He has been followed by RD via telephone for several months. Patient has had continued wt loss and weighed 126 pounds 6.4 oz today. Appears cachectic. Patient reports occasional nausea but declines nausea medication. He eats a healthy diet and drinks 3 Ensure Plus daily. Consumes healthy fats such as nuts, nut butter, avocados. Reports it has always been difficult for him to gain wt.  Encouraged him to continue high calorie high protein meals and snacks. Increase Ensure Plus high protein if tolerated. Suggested he could try fish oil to help with wt gain/cachexia however patient prefers to eat more fish.  Provided one complimentary case of Ensure Plus High Protein and coupons with contact information. Patient encouraged to contact RD for further needs.

## 2022-01-26 NOTE — Patient Instructions (Signed)
[facility]   Discharge Instructions: Thank you for choosing [facility] to provide your oncology and hematology care.   If you have a lab appointment with the [facility], please go directly to the [facility] and check in at the registration area.   Wear comfortable clothing and clothing appropriate for easy access to any Portacath or PICC line.   We strive to give you quality time with your provider. You may need to reschedule your appointment if you arrive late (15 or more minutes).  Arriving late affects you and other patients whose appointments are after yours.  Also, if you miss three or more appointments without notifying the office, you may be dismissed from the clinic at the provider's discretion.      For prescription refill requests, have your pharmacy contact our office and allow 72 hours for refills to be completed.    Today you received the following chemotherapy and/or immunotherapy agents Gemcitabine, Carboplatin.      To help prevent nausea and vomiting after your treatment, we encourage you to take your nausea medication as directed.  BELOW ARE SYMPTOMS THAT SHOULD BE REPORTED IMMEDIATELY: *FEVER GREATER THAN 100.4 F (38 C) OR HIGHER *CHILLS OR SWEATING *NAUSEA AND VOMITING THAT IS NOT CONTROLLED WITH YOUR NAUSEA MEDICATION *UNUSUAL SHORTNESS OF BREATH *UNUSUAL BRUISING OR BLEEDING *URINARY PROBLEMS (pain or burning when urinating, or frequent urination) *BOWEL PROBLEMS (unusual diarrhea, constipation, pain near the anus) TENDERNESS IN MOUTH AND THROAT WITH OR WITHOUT PRESENCE OF ULCERS (sore throat, sores in mouth, or a toothache) UNUSUAL RASH, SWELLING OR PAIN  UNUSUAL VAGINAL DISCHARGE OR ITCHING   Items with * indicate a potential emergency and should be followed up as soon as possible or go to the Emergency Department if any problems should occur.  Please show the CHEMOTHERAPY ALERT CARD or IMMUNOTHERAPY ALERT CARD  at check-in to the Emergency Department and triage nurse.  Should you have questions after your visit or need to cancel or reschedule your appointment, please contact [facility]  Dept: [phone]  and follow the prompts.  Office hours are 8:00 a.m. to 4:30 p.m. Monday - Friday. Please note that voicemails left after 4:00 p.m. may not be returned until the following business day.  We are closed weekends and major holidays. You have access to a nurse at all times for urgent questions. Please call the main number to the clinic Dept: [phone] and follow the prompts.   For any non-urgent questions, you may also contact your provider using MyChart. We now offer e-Visits for anyone 76 and older to request care online for non-urgent symptoms. For details visit [email or web address].   Also download the MyChart app! Go to the app store, search "MyChart", open the app, select , and log in with your MyChart username and password.  Masks are optional in the cancer centers. If you would like for your care team to wear a mask while they are taking care of you, please let them know. For doctor visits, patients may have with them one support person who is at least 75 years old. At this time, visitors are not allowed in the infusion area.  Gemcitabine injection What is this medication? GEMCITABINE (jem SYE ta been) is a chemotherapy drug. This medicine is used to treat many types of cancer like breast cancer, lung cancer, pancreatic cancer, and ovarian cancer. This medicine may be used for other purposes; ask your health care provider  or pharmacist if you have questions. COMMON BRAND NAME(S): Gemzar, Infugem What should I tell my care team before I take this medication? They need to know if you have any of these conditions: blood disorders infection kidney disease liver disease lung or breathing disease, like asthma recent or ongoing radiation therapy an unusual  or allergic reaction to gemcitabine, other chemotherapy, other medicines, foods, dyes, or preservatives pregnant or trying to get pregnant breast-feeding How should I use this medication? This drug is given as an infusion into a vein. It is administered in a hospital or clinic by a specially trained health care professional. Talk to your pediatrician regarding the use of this medicine in children. Special care may be needed. Overdosage: If you think you have taken too much of this medicine contact a poison control center or emergency room at once. NOTE: This medicine is only for you. Do not share this medicine with others. What if I miss a dose? It is important not to miss your dose. Call your doctor or health care professional if you are unable to keep an appointment. What may interact with this medication? medicines to increase blood counts like filgrastim, pegfilgrastim, sargramostim some other chemotherapy drugs like cisplatin vaccines Talk to your doctor or health care professional before taking any of these medicines: acetaminophen aspirin ibuprofen ketoprofen naproxen This list may not describe all possible interactions. Give your health care provider a list of all the medicines, herbs, non-prescription drugs, or dietary supplements you use. Also tell them if you smoke, drink alcohol, or use illegal drugs. Some items may interact with your medicine. What should I watch for while using this medication? Visit your doctor for checks on your progress. This drug may make you feel generally unwell. This is not uncommon, as chemotherapy can affect healthy cells as well as cancer cells. Report any side effects. Continue your course of treatment even though you feel ill unless your doctor tells you to stop. In some cases, you may be given additional medicines to help with side effects. Follow all directions for their use. Call your doctor or health care professional for advice if you get a  fever, chills or sore throat, or other symptoms of a cold or flu. Do not treat yourself. This drug decreases your body's ability to fight infections. Try to avoid being around people who are sick. This medicine may increase your risk to bruise or bleed. Call your doctor or health care professional if you notice any unusual bleeding. Be careful brushing and flossing your teeth or using a toothpick because you may get an infection or bleed more easily. If you have any dental work done, tell your dentist you are receiving this medicine. Avoid taking products that contain aspirin, acetaminophen, ibuprofen, naproxen, or ketoprofen unless instructed by your doctor. These medicines may hide a fever. Do not become pregnant while taking this medicine or for 6 months after stopping it. Women should inform their doctor if they wish to become pregnant or think they might be pregnant. Men should not father a child while taking this medicine and for 3 months after stopping it. There is a potential for serious side effects to an unborn child. Talk to your health care professional or pharmacist for more information. Do not breast-feed an infant while taking this medicine or for at least 1 week after stopping it. Men should inform their doctors if they wish to father a child. This medicine may lower sperm counts. Talk with your doctor or health  care professional if you are concerned about your fertility. What side effects may I notice from receiving this medication? Side effects that you should report to your doctor or health care professional as soon as possible: allergic reactions like skin rash, itching or hives, swelling of the face, lips, or tongue breathing problems pain, redness, or irritation at site where injected signs and symptoms of a dangerous change in heartbeat or heart rhythm like chest pain; dizziness; fast or irregular heartbeat; palpitations; feeling faint or lightheaded, falls; breathing  problems signs of decreased platelets or bleeding - bruising, pinpoint red spots on the skin, black, tarry stools, blood in the urine signs of decreased red blood cells - unusually weak or tired, feeling faint or lightheaded, falls signs of infection - fever or chills, cough, sore throat, pain or difficulty passing urine signs and symptoms of kidney injury like trouble passing urine or change in the amount of urine signs and symptoms of liver injury like dark yellow or brown urine; general ill feeling or flu-like symptoms; light-colored stools; loss of appetite; nausea; right upper belly pain; unusually weak or tired; yellowing of the eyes or skin swelling of ankles, feet, hands Side effects that usually do not require medical attention (report to your doctor or health care professional if they continue or are bothersome): constipation diarrhea hair loss loss of appetite nausea rash vomiting This list may not describe all possible side effects. Call your doctor for medical advice about side effects. You may report side effects to FDA at 1-800-FDA-1088. Where should I keep my medication? This drug is given in a hospital or clinic and will not be stored at home. NOTE: This sheet is a summary. It may not cover all possible information. If you have questions about this medicine, talk to your doctor, pharmacist, or health care provider.  2023 Elsevier/Gold Standard (2017-10-25 00:00:00)  Carboplatin injection What is this medication? CARBOPLATIN (KAR boe pla tin) is a chemotherapy drug. It targets fast dividing cells, like cancer cells, and causes these cells to die. This medicine is used to treat ovarian cancer and many other cancers. This medicine may be used for other purposes; ask your health care provider or pharmacist if you have questions. COMMON BRAND NAME(S): Paraplatin What should I tell my care team before I take this medication? They need to know if you have any of these  conditions: blood disorders hearing problems kidney disease recent or ongoing radiation therapy an unusual or allergic reaction to carboplatin, cisplatin, other chemotherapy, other medicines, foods, dyes, or preservatives pregnant or trying to get pregnant breast-feeding How should I use this medication? This drug is usually given as an infusion into a vein. It is administered in a hospital or clinic by a specially trained health care professional. Talk to your pediatrician regarding the use of this medicine in children. Special care may be needed. Overdosage: If you think you have taken too much of this medicine contact a poison control center or emergency room at once. NOTE: This medicine is only for you. Do not share this medicine with others. What if I miss a dose? It is important not to miss a dose. Call your doctor or health care professional if you are unable to keep an appointment. What may interact with this medication? medicines for seizures medicines to increase blood counts like filgrastim, pegfilgrastim, sargramostim some antibiotics like amikacin, gentamicin, neomycin, streptomycin, tobramycin vaccines Talk to your doctor or health care professional before taking any of these medicines: acetaminophen aspirin ibuprofen  ketoprofen naproxen This list may not describe all possible interactions. Give your health care provider a list of all the medicines, herbs, non-prescription drugs, or dietary supplements you use. Also tell them if you smoke, drink alcohol, or use illegal drugs. Some items may interact with your medicine. What should I watch for while using this medication? Your condition will be monitored carefully while you are receiving this medicine. You will need important blood work done while you are taking this medicine. This drug may make you feel generally unwell. This is not uncommon, as chemotherapy can affect healthy cells as well as cancer cells. Report any side  effects. Continue your course of treatment even though you feel ill unless your doctor tells you to stop. In some cases, you may be given additional medicines to help with side effects. Follow all directions for their use. Call your doctor or health care professional for advice if you get a fever, chills or sore throat, or other symptoms of a cold or flu. Do not treat yourself. This drug decreases your body's ability to fight infections. Try to avoid being around people who are sick. This medicine may increase your risk to bruise or bleed. Call your doctor or health care professional if you notice any unusual bleeding. Be careful brushing and flossing your teeth or using a toothpick because you may get an infection or bleed more easily. If you have any dental work done, tell your dentist you are receiving this medicine. Avoid taking products that contain aspirin, acetaminophen, ibuprofen, naproxen, or ketoprofen unless instructed by your doctor. These medicines may hide a fever. Do not become pregnant while taking this medicine. Women should inform their doctor if they wish to become pregnant or think they might be pregnant. There is a potential for serious side effects to an unborn child. Talk to your health care professional or pharmacist for more information. Do not breast-feed an infant while taking this medicine. What side effects may I notice from receiving this medication? Side effects that you should report to your doctor or health care professional as soon as possible: allergic reactions like skin rash, itching or hives, swelling of the face, lips, or tongue signs of infection - fever or chills, cough, sore throat, pain or difficulty passing urine signs of decreased platelets or bleeding - bruising, pinpoint red spots on the skin, black, tarry stools, nosebleeds signs of decreased red blood cells - unusually weak or tired, fainting spells, lightheadedness breathing problems changes in  hearing changes in vision chest pain high blood pressure low blood counts - This drug may decrease the number of white blood cells, red blood cells and platelets. You may be at increased risk for infections and bleeding. nausea and vomiting pain, swelling, redness or irritation at the injection site pain, tingling, numbness in the hands or feet problems with balance, talking, walking trouble passing urine or change in the amount of urine Side effects that usually do not require medical attention (report to your doctor or health care professional if they continue or are bothersome): hair loss loss of appetite metallic taste in the mouth or changes in taste This list may not describe all possible side effects. Call your doctor for medical advice about side effects. You may report side effects to FDA at 1-800-FDA-1088. Where should I keep my medication? This drug is given in a hospital or clinic and will not be stored at home. NOTE: This sheet is a summary. It may not cover all possible information. If  you have questions about this medicine, talk to your doctor, pharmacist, or health care provider.  2023 Elsevier/Gold Standard (2008-01-09 00:00:00)

## 2022-01-26 NOTE — Progress Notes (Signed)
Pharmacist Chemotherapy Monitoring - Initial Assessment    Anticipated start date: 01/26/22   The following has been reviewed per standard work regarding the patient's treatment regimen: The patient's diagnosis, treatment plan and drug doses, and organ/hematologic function Lab orders and baseline tests specific to treatment regimen  The treatment plan start date, drug sequencing, and pre-medications Prior authorization status  Patient's documented medication list, including drug-drug interaction screen and prescriptions for anti-emetics and supportive care specific to the treatment regimen The drug concentrations, fluid compatibility, administration routes, and timing of the medications to be used The patient's access for treatment and lifetime cumulative dose history, if applicable  The patient's medication allergies and previous infusion related reactions, if applicable   Changes made to treatment plan:  N/A  Follow up needed:  N/A   Patrica Duel, [facility], 01/26/2022  12:28 PM

## 2022-01-27 ENCOUNTER — Telehealth: Payer: Self-pay | Admitting: Emergency Medicine

## 2022-01-27 NOTE — Telephone Encounter (Signed)
24 Hour Callback 24 Hour Callback for 1st time Gemzar and Carboplatin Infusion.  No answer, left voicemail message instructing patient to call with any questions or concerns

## 2022-01-31 ENCOUNTER — Other Ambulatory Visit: Payer: Self-pay

## 2022-01-31 DIAGNOSIS — C7951 Secondary malignant neoplasm of bone: Secondary | ICD-10-CM

## 2022-02-02 ENCOUNTER — Ambulatory Visit (INDEPENDENT_AMBULATORY_CARE_PROVIDER_SITE_OTHER): Payer: Medicare Other | Admitting: Nurse Practitioner

## 2022-02-02 ENCOUNTER — Ambulatory Visit: Payer: Medicare Other

## 2022-02-02 ENCOUNTER — Telehealth: Payer: Self-pay | Admitting: Nurse Practitioner

## 2022-02-02 ENCOUNTER — Encounter: Payer: Self-pay | Admitting: Nurse Practitioner

## 2022-02-02 ENCOUNTER — Inpatient Hospital Stay: Payer: Medicare Other

## 2022-02-02 ENCOUNTER — Telehealth: Payer: Self-pay

## 2022-02-02 VITALS — BP 94/52 | HR 109 | Temp 97.8°F | Ht 73.0 in | Wt 128.2 lb

## 2022-02-02 DIAGNOSIS — J984 Other disorders of lung: Secondary | ICD-10-CM

## 2022-02-02 DIAGNOSIS — R0602 Shortness of breath: Secondary | ICD-10-CM

## 2022-02-02 DIAGNOSIS — Z5111 Encounter for antineoplastic chemotherapy: Secondary | ICD-10-CM | POA: Diagnosis not present

## 2022-02-02 DIAGNOSIS — L089 Local infection of the skin and subcutaneous tissue, unspecified: Secondary | ICD-10-CM | POA: Diagnosis not present

## 2022-02-02 DIAGNOSIS — R42 Dizziness and giddiness: Secondary | ICD-10-CM

## 2022-02-02 DIAGNOSIS — J90 Pleural effusion, not elsewhere classified: Secondary | ICD-10-CM

## 2022-02-02 DIAGNOSIS — R5381 Other malaise: Secondary | ICD-10-CM

## 2022-02-02 DIAGNOSIS — C3491 Malignant neoplasm of unspecified part of right bronchus or lung: Secondary | ICD-10-CM | POA: Diagnosis not present

## 2022-02-02 DIAGNOSIS — C7951 Secondary malignant neoplasm of bone: Secondary | ICD-10-CM

## 2022-02-02 DIAGNOSIS — J439 Emphysema, unspecified: Secondary | ICD-10-CM | POA: Diagnosis not present

## 2022-02-02 DIAGNOSIS — C3490 Malignant neoplasm of unspecified part of unspecified bronchus or lung: Secondary | ICD-10-CM | POA: Diagnosis not present

## 2022-02-02 DIAGNOSIS — J9601 Acute respiratory failure with hypoxia: Secondary | ICD-10-CM

## 2022-02-02 DIAGNOSIS — I480 Paroxysmal atrial fibrillation: Secondary | ICD-10-CM | POA: Diagnosis not present

## 2022-02-02 DIAGNOSIS — C782 Secondary malignant neoplasm of pleura: Secondary | ICD-10-CM | POA: Diagnosis not present

## 2022-02-02 DIAGNOSIS — L729 Follicular cyst of the skin and subcutaneous tissue, unspecified: Secondary | ICD-10-CM

## 2022-02-02 LAB — BASIC METABOLIC PANEL - CANCER CENTER ONLY
Anion gap: 8 (ref 5–15)
BUN: 24 mg/dL — ABNORMAL HIGH (ref 8–23)
CO2: 32 mmol/L (ref 22–32)
Calcium: 9.8 mg/dL (ref 8.9–10.3)
Chloride: 97 mmol/L — ABNORMAL LOW (ref 98–111)
Creatinine: 0.99 mg/dL (ref 0.61–1.24)
GFR, Estimated: >60 mL/min (ref >60)
Glucose, Bld: 108 mg/dL — ABNORMAL HIGH (ref 70–99)
Potassium: 4.6 mmol/L (ref 3.5–5.1)
Sodium: 137 mmol/L (ref 135–145)

## 2022-02-02 MED ORDER — IPRATROPIUM-ALBUTEROL 0.5-2.5 (3) MG/3ML IN SOLN: 3.0000 mL | RESPIRATORY_TRACT | 5 refills | Status: DC | PRN

## 2022-02-02 MED ORDER — DOXYCYCLINE HYCLATE 100 MG PO TABS: 100.0000 mg | ORAL_TABLET | Freq: Two times a day (BID) | ORAL | 0 refills | Status: AC

## 2022-02-02 MED ORDER — PREDNISONE 10 MG PO TABS: ORAL_TABLET | ORAL | 0 refills | Status: AC

## 2022-02-02 NOTE — Patient Instructions (Addendum)
Start duoneb 3 mL every 4-6 hours as needed for shortness of breath or wheezing. Use at least twice daily in the morning and in the evening  Continue Albuterol inhaler 2 puffs every 6 hours as needed for shortness of breath or wheezing. Notify if symptoms persist despite rescue inhaler/neb use. Continue supplemental oxygen 4 lpm continuous. Use your portable continuous tanks when you are out of the house instead of your POC. Goal >88-90%  Prednisone taper. 4 tabs for 2 days, then 3 tabs for 2 days, 2 tabs for 2 days, then 1 tab for 2 days, then stop. Take in AM with food Doxycycline 1 tab Twice daily for 10 days. Take with food. Wear sunscreen when outside and avoid direct sun exposure while taking Mucinex [phone] mg Twice daily for chest congestion Flutter valve 2-3 times a day after you use your nebulizer treatment   Referral to DME for wheelchair Referral to oncology social worker for home health nurse Referral to palliative care placed today   Someone will contact you to schedule your perfusion scan, leg ultrasound, MRI of the brain, and arterial blood gas.   EKG today  Chest x ray today.  Follow up in 2 weeks with Dr. Valeta Harms. If no availability, can follow up with Katie Jasmeen Fritsch,NP. If symptoms do not improve or worsen, please contact office for sooner follow up or seek emergency care.

## 2022-02-02 NOTE — Telephone Encounter (Signed)
No answer,I send my chart message

## 2022-02-02 NOTE — Telephone Encounter (Signed)
Please advise 

## 2022-02-02 NOTE — Telephone Encounter (Signed)
-----   Message from Owens Shark, NP sent at 02/02/2022  2:04 PM EDT ----- Please let him know the calcium level from today is normal.  Follow-up as scheduled.

## 2022-02-02 NOTE — Progress Notes (Unsigned)
@Patient  ID: Brian Ramirez, male    DOB: Nov 12, 1945, 76 y.o.   MRN: [id number]  Chief Complaint  Patient presents with   Follow-up    Follow up. Patient states that every night he is waking up in the middle of the night gasping for air and dryness of the mouth.     Referring provider: [name], MD  HPI: 76 year old male, former smoker followed for mixed restrictive and obstructive lung disease, chronic respiratory failure on supplemental oxygen, advanced end stage NSCLC, chronic right pleural effusion with trapped lung. He is a patient of Dr. Juline Patch and last seen in office on 10/18/2021. Past medical history significant for CHF, a flutter, HTN, CAD, anemia of chronic disease.   He was diagnosed with lung cancer in February 2020 and managed by Dr. [name]. He has been treated with multiple rounds of chemotherapy and immunotherapy, formerly on Keytruda; concern for drug induced pneumonitis in January 2023 so Beryle Flock was stopped. Transitioned to docetaxel/ramucirumab; however, due to continued disease progression, he was recently changed to carboplatin/gemcitabine, which he started last week (01/26/2022). He also underwent radiation to spine mets earlier this year.  He was referred to the pulmonary clinic in December 2022 for progressive pulmonary densities within the chest on CT imaging and need for bronchoscopy with biopsy.  Also required management of a pleural effusion, loculated and suspected to be malignant. He had multiple thoracentesis procedures and at one point had a Pleurx drain, in 2020. In January, he was admitted to the hospital for progressive dyspnea and weakness, suspected to be pneumonitis from [facility] and CAP. He was treated with aggressive steroid course and abx, and had improvement in his dyspnea. He underwent bronchoscopy during his stay on 08/30/2021; cultures negative with reactive mesothelial cells. His transbronchial biopsy and brushings were negative for  malignancy. He was tapped as well during his stay by IR; only drained off 300 cc of thick, bloody pleural fluid with no significant change in imaging afterwards. He was discharged 1/17.   TEST/EVENTS:  10/18/2021 PFTs: FVC 34, FEV1 39, ratio 89, DLCOcor 42. Mixed restrictive and obstructive defect with severe diffusion defect  01/20/2022 CT chest wo contrast: atherosclerosis. Interval development of extensive right axillary and subpectoral LAD, largest lymph node in the deep right axilla measuring up to 2 cm. Previously noted patchy airspace consolidation in left lung has improved. There is persistent patchy areas of ground-glass, nodular consolidative changes, and septal thickening in the RUL. Several more defined areas of nodular distortion are increasingly apparent on study, including a 2x1.4 cm in LLL and 2.1 x 1.2 cm lesion in the RML, as well as other small nodules. Several areas of chronic mucoid impaction with BTX. Small chronic left effusion, decreased. Large chronic loculated thick-walled right-sided pleural effusion, minimally increased with new areas of pleural and chest wall invasion. Study demonstrates definitive progression of diseas with worsening pleural based metastasis now invading the right chest wall with involvement of multiple right-sided ribs, chest wall musculature, and new bulky right axillary and subpectoral LAD.   10/18/2021: OV with Dr. Valeta Harms for follow up after hospitalization and bronchoscopy. He underwent PFTs prior to visit which showed mixed obstructive and restrictive lung disease. Restriction related to pneumonitis as well as trapped lung/pleural effusion. Did not think repeat tapping would make any benefit. Previously started on Stiolto with samples; continued at this visit. Completed prednisone taper previously. Remained on supplemental oxygen; discussed of uncertainty in being able to ever wean him off. Referred to  pulmonary rehab. F/u 6 months.   02/02/2022:  Today-follow-up Patient presented today with close friend for follow-up to discuss possibly getting started on a noninvasive ventilator or BiPAP at night.;  Although, we ended up discussing multiple different issues that he is currently experiencing.    He was placed on BiPAP during his hospital stay and felt like he was sleeping much better with this. He did not wake up gasping for air and felt like he could breathe better.  He was not discharged home on anything, aside from his supplemental O2.  He says that over the past few months since he has been home, he feels like nights have gotten worse for him.  He commonly wakes up gasping for air and with a very dry mouth.  He has had trouble lying flat and sleeping normally at night because of this.  Sometimes feels like he is suffocating.  This is caused him to have poor sleep at night and sleep frequently throughout the day.  Feels like he sleeps at least 20 hours a day and still is tired.  It takes a lot out of him to just leave the house for a lab draw or office visit.  He is very weak and has trouble with mobility.  He currently uses a cane but this is not quite enough for him.  He has had to cancel doctors appointments as he has been unable to get inside due to not having a wheelchair.  Feels like his legs are just not strong enough to carry him anymore.  This has caused him to have difficulties with performing ADLs around the house as well including preparing meals for himself.  Appetite is also just not what it used to be and it takes a lot out of him just to eat a few bites. He has lost around 20 pounds since we saw him in March.    He has also had progressive dyspnea over the last few months, even more so over the last couple of weeks. He has not noticed any drops in his oxygen at home and has stayed on 4 lpm. He does have a productive cough, which is unchanged from baseline with clear phlegm. He doesn't notice much wheezing. Denies hemoptysis, lower  extremity swelling, calf pain/redness. He did have a recent CT scan, ordered by Dr. Learta Codding, which appears that his disease continues to progress, despite multiple treatment regimens. He has not discussed these findings with Dr. Learta Codding yet. There was some noted improvement in patchy airspace consolidation in left nung. There was some persistent ground glass areas as well as a large loculated pleural effusion, minimally increased. The effusion now appears to be invading the pleura and chest wall.   He also noted that he has developed nodules on the right side of his chest wall. They are non-tender but the largest is itching. He was unsure if these were acne from his chemo treatments, but they are rather large for this. He did show them to the oncology NP at his last visit and they were unsure that this was cancer. He is unsure if it is any more red today than previously. He has not had any fevers, chills or night sweats.   He has new dizziness over the past two weeks. He denies any syncopal events. He thought it was related to new chemo regimen. He feels weaker and like his mobility has declined as well, worse from baseline.   No Known Allergies  Immunization History  Administered  Date(s) Administered   Fluad Quad(high Dose 65+) 05/28/2019, 06/19/2020, 07/05/2021   PFIZER(Purple Top)SARS-COV-2 Vaccination 09/29/2019, 10/22/2019, 04/28/2020   Pneumococcal Conjugate-13 03/03/2015   Pneumococcal Polysaccharide-23 07/09/2012   Tdap 06/30/2009   Zoster, Live 07/18/2011    Past Medical History:  Diagnosis Date   Acute congestive heart failure (HCC)    Anemia of chronic disease 10/08/2018   Anticoagulated    Atrial flutter with rapid ventricular response ([place]) 10/08/2018   CAD in native artery 06/01/2021   Difficult airway for intubation 09/26/2016   Difficult intubation    Jaw only opens up half way to intubate.   Edema of both lower extremities 10/09/2018   Essential hypertension 06/01/2021    Exertional dyspnea 06/01/2021   Goals of care, counseling/discussion 09/20/2018   Hernia 2012   History of hiatal hernia    History of kidney stones    Left inguinal hernia s/p lap repair 11/22/2016 09/26/2016   Lung cancer ([place]) 09/20/2018   Lung mass 09/04/2018   Malnutrition of moderate degree 09/20/2018   Mass of right lung 09/04/2018   New onset atrial fibrillation (HCC)    Personal history of kidney stones 1992   Pleural effusion 09/04/2018   Pleural effusion on right    Pressure injury of skin 09/20/2018   SOB (shortness of breath) 09/19/2018    Tobacco History: Social History   Tobacco Use  Smoking Status Former   Packs/day: 1.00   Years: 30.00   Total pack years: 30.00   Types: Cigarettes   Quit date: 1998   Years since quitting: 25.4  Smokeless Tobacco Never   Counseling given: Not Answered   Outpatient Medications Prior to Visit  Medication Sig Dispense Refill   albuterol (VENTOLIN HFA) 108 (90 Base) MCG/ACT inhaler Inhale 2 puffs into the lungs every 6 (six) hours as needed for wheezing or shortness of breath. 18 g 2   Artificial Tear Ointment (DRY EYES OP) Apply 1 drop to eye as needed (Dry, itching eyes).     aspirin EC 81 MG tablet Take 1 tablet (81 mg total) by mouth daily. 90 tablet 3   feeding supplement (ENSURE ENLIVE / ENSURE PLUS) LIQD Take 237 mLs by mouth 3 (three) times daily between meals. 237 mL 12   HYDROcodone-acetaminophen (NORCO) 5-325 MG tablet Take 1 tablet by mouth every 6 (six) hours as needed for moderate pain. 60 tablet 0   hydrocortisone cream 1 % 1 application     ibuprofen (ADVIL) 100 MG chewable tablet Chew by mouth as needed (as needed).     magic mouthwash (nystatin, diphenhydrAMINE, alum & mag hydroxide) suspension mixture Swish and spit 5 mL in mouth four times daily as needed for mouth pain. 240 mL 0   magic mouthwash SOLN Take 5 mLs by mouth 4 (four) times daily. For mouth pain. Swish and Spit 240 mL 0    oxyCODONE-acetaminophen (PERCOCET/ROXICET) 5-325 MG tablet Take 1-2 tablets by mouth every 6 (six) hours as needed for severe pain. 30 tablet 0   prochlorperazine (COMPAZINE) 10 MG tablet Take 1 tablet (10 mg total) by mouth every 6 (six) hours as needed for nausea or vomiting. 30 tablet 0   No facility-administered medications prior to visit.     Review of Systems:   Constitutional: No night sweats, fevers, chills. +fatigue, lassitude, unintentional weight loss HEENT: No headaches, difficulty swallowing, tooth/dental problems, or sore throat. No sneezing, itching, ear ache, nasal congestion, or post nasal drip CV:  +orthopnea, PND. No chest pain, swelling  in lower extremities, anasarca, palpitations, syncope Resp: +shortness of breath with exertion (increased); productive cough (chronic). No hemoptysis. No wheezing.  No chest wall deformity GI:  +anorexia. No heartburn, indigestion, abdominal pain, nausea, vomiting, diarrhea, change in bowel habits, bloody stools.  GU: No dysuria, change in color of urine, urgency or frequency.  No flank pain, no hematuria  Skin: +multiple right sided nodules. No rash MSK:  No joint pain or swelling.  No decreased range of motion.  No back pain. Neuro: +dizziness/lightheadedness; unstable gait; generalized weakness Psych: No depression or anxiety. Mood stable.     Physical Exam:  BP (!) 94/52 (BP Location: Right Arm, Patient Position: Sitting, Cuff Size: Normal)   Pulse (!) 109   Temp 97.8 F (36.6 C) (Oral)   Ht 6\' 1"  (1.854 m)   Wt 128 lb 3.2 oz (58.2 kg)   SpO2 92%   BMI 16.91 kg/m   GEN: Pleasant, interactive, chronically-ill appearing; in no acute distress. HEENT:  Normocephalic and atraumatic. PERRLA. Sclera white. Nasal turbinates pink, moist and patent bilaterally. No rhinorrhea present. Oropharynx pink and moist, without exudate or edema. No lesions, ulcerations, or postnasal drip.  NECK:  Supple w/ fair ROM. No JVD present. Normal  carotid impulses w/o bruits. Thyroid symmetrical with no goiter or nodules palpated. No lymphadenopathy.   CV: Irregular, rate controlled, no m/r/g, no peripheral edema. Pulses intact, +2 bilaterally. No cyanosis, pallor or clubbing. PULMONARY:  Regular breathing; pursed lip breathing with tachypnea (baseline for him). Diminished bilaterally A&P w/o wheezes/rales/rhonchi. No accessory muscle use. No dullness to percussion. GI: BS present and normoactive. Soft, non-tender to palpation. No organomegaly or masses detected. No CVA tenderness. MSK: No erythema, warmth or tenderness. Cap refil <2 sec all extrem. No deformities or joint swelling noted. Muscle wasting Neuro: A/Ox3. No focal deficits noted.   Skin: Warm, no rashes. 3-4 cutaneous lesions right chest wall; limited mobility. Largest - erythematous (see below); no exudate Psych: Normal affect and behavior. Judgement and thought content appropriate.      Lab Results:  CBC    Component Value Date/Time   WBC 19.0 (H) 01/26/2022 1116   WBC 13.4 (H) 08/31/2021 0731   RBC 3.90 (L) 01/26/2022 1116   HGB 11.5 (L) 01/26/2022 1116   HCT 37.8 (L) 01/26/2022 1116   PLT 268 01/26/2022 1116   MCV 96.9 01/26/2022 1116   MCH 29.5 01/26/2022 1116   MCHC 30.4 01/26/2022 1116   RDW 15.1 01/26/2022 1116   LYMPHSABS 0.8 01/26/2022 1116   MONOABS 1.3 (H) 01/26/2022 1116   EOSABS 0.1 01/26/2022 1116   BASOSABS 0.1 01/26/2022 1116    BMET    Component Value Date/Time   NA 137 02/02/2022 1316   NA 137 07/05/2021 0000   K 4.6 02/02/2022 1316   CL 97 (L) 02/02/2022 1316   CO2 32 02/02/2022 1316   GLUCOSE 108 (H) 02/02/2022 1316   BUN 24 (H) 02/02/2022 1316   BUN 22 07/05/2021 0000   CREATININE 0.99 02/02/2022 1316   CALCIUM 9.8 02/02/2022 1316   GFRNONAA >60 02/02/2022 1316   GFRAA 46 (L) 05/08/2020 1313    BNP    Component Value Date/Time   BNP 250.4 (H) 08/30/2021 0451     Imaging:  CT Chest Wo Contrast  Result Date:  01/22/2022 CLINICAL DATA:  76 year old male with history of lung cancer with metastatic disease to the bones. EXAM: CT CHEST WITHOUT CONTRAST TECHNIQUE: Multidetector CT imaging of the chest was performed following the standard  protocol without IV contrast. RADIATION DOSE REDUCTION: This exam was performed according to the departmental dose-optimization program which includes automated exposure control, adjustment of the mA and/or kV according to patient size and/or use of iterative reconstruction technique. COMPARISON:  Multiple priors, most recently PET-CT 11/25/2021 and chest CT 08/26/2021. FINDINGS: Cardiovascular: Heart size is normal. There is no significant pericardial fluid, thickening or pericardial calcification. There is aortic atherosclerosis, as well as atherosclerosis of the great vessels of the mediastinum and the coronary arteries, including calcified atherosclerotic plaque in the left main, left anterior descending and left circumflex coronary arteries. Calcifications of the aortic valve. Mediastinum/Nodes: No pathologically enlarged mediastinal or hilar lymph nodes. Please note that accurate exclusion of hilar adenopathy is limited on noncontrast CT scans. Esophagus is unremarkable in appearance. Interval development of extensive right axillary and subpectoral lymphadenopathy, with the largest lymph node in the deep right axilla measuring up to 2 cm in short axis (axial image 35 of series 2). Largest subpectoral lymph node measures up to 1.1 cm in short axis (axial image 30 of series 2). No definite left axillary or subpectoral lymphadenopathy noted. Lungs/Pleura: Previously noted patchy airspace consolidation in the left lung has overall improved. Persistent patchy areas of ground-glass attenuation, nodular consolidative changes and septal thickening with architectural distortion in the right upper lobe, similar to the prior study. Several more well-defined areas of nodular architectural  distortion are increasingly apparent on today's study, including a 2.0 x 1.4 cm region in the left lower lobe (axial image 91 of series 4) and a 2.1 x 1.2 cm lesion in the right middle lobe (axial image 112 of series 4), as well as multiple other smaller pulmonary nodules. Several areas of chronic mucoid impaction are also noted with associated areas of cylindrical bronchiectasis, most pronounced in the right middle lobe and inferior segment of the lingula. Small chronic left pleural effusion, decreased slightly compared to the prior study. Large chronic loculated thick-walled right-sided pleural effusion, minimally increased compared to the prior study, with new areas of pleural and chest wall invasion (discussed below), increased compared to the prior study. Upper Abdomen: Aortic atherosclerosis. Musculoskeletal: Destructive osteolytic changes are noted in the lateral and posterolateral aspect of the right ninth rib. These are associated with soft tissue lesions in the right pleura and chest wall common best appreciated along the posterolateral aspect of the right pleural surface (axial image 103 of series 2) measuring 3.3 x 1.0 cm, and laterally in the right chest wall musculature and right pleura (axial image 124 of series 2) measuring 2.5 x 2.6 cm. Additional areas of chest wall invasion are noted more inferiorly (axial image 144 of series 2) invading the intercostal spaces between the ninth and tenth ribs, and the tenth and eleventh ribs. Additional destructive lesion in the right chest wall anterolaterally (axial image 149 of series 2) measuring 2.6 x 2.6 cm with some osteolysis in the adjacent seventh and eighth ribs. IMPRESSION: 1. Today's study demonstrates definitive progression of disease with worsening pleural based metastasis now invading the right chest wall with involvement of multiple right-sided ribs, chest wall musculature, and new bulky right axillary and subpectoral lymphadenopathy, as detailed  above. 2. Several new areas of nodular architectural distortion are noted in the lungs. These could reflect areas of chronic post infectious or inflammatory scarring, or could represent metastatic lesions. Attention on follow-up studies is recommended. 3. Aortic atherosclerosis, in addition to left main and 2 vessel coronary artery disease. 4. There are calcifications of the aortic  valve. Echocardiographic correlation for evaluation of potential valvular dysfunction may be warranted if clinically indicated. Aortic Atherosclerosis (ICD10-I70.0). Electronically Signed   By: Vinnie Langton M.D.   On: 01/22/2022 08:44    acetaminophen (TYLENOL) tablet 650 mg     Date Action Dose Route User   12/09/2021 1306 Given 650 mg Oral Cauwels, Christine, RN      acetaminophen (TYLENOL) tablet 650 mg     Date Action Dose Route User   12/30/2021 1111 Given 650 mg Oral Cauwels, Christine, RN      CARBOplatin (PARAPLATIN) 370 mg in sodium chloride 0.9 % 100 mL chemo infusion     Date Action Dose Route User   01/26/2022 1454 Infusion Verify (none) Intravenous Jyaire, Koudelka, RN   01/26/2022 1453 Rate/Dose Change (none) Intravenous Marylu Lund, RN   01/26/2022 1453 Rate/Dose Change (none) Intravenous Marylu Lund, RN   01/26/2022 1424 Infusion Verify (none) Intravenous Phenix, Vandermeulen, RN   01/26/2022 1423 New Bag/Given 370 mg Intravenous Marylu Lund, RN      dexamethasone (DECADRON) 10 mg in sodium chloride 0.9 % 50 mL IVPB     Date Action Dose Route User   12/09/2021 1310 Infusion Verify (none) Intravenous Jerold Coombe, RN   12/09/2021 1310 New Bag/Given 10 mg Intravenous Cauwels, Christine, RN      dexamethasone (DECADRON) 10 mg in sodium chloride 0.9 % 50 mL IVPB     Date Action Dose Route User   12/30/2021 1120 Rate/Dose Change (none) Intravenous Jerold Coombe, RN   12/30/2021 1120 New Bag/Given 10 mg Intravenous Cauwels, Christine, RN      dexamethasone (DECADRON) 10 mg in  sodium chloride 0.9 % 50 mL IVPB     Date Action Dose Route User   01/26/2022 1259 Infusion Verify (none) Intravenous Hanad, Leino, RN   01/26/2022 1258 Rate/Dose Change (none) Intravenous Marylu Lund, RN   01/26/2022 1240 Rate/Dose Change (none) Intravenous Kypton, Eltringham, RN   01/26/2022 1240 New Bag/Given 10 mg Intravenous Marylu Lund, RN      diphenhydrAMINE (BENADRYL) injection 50 mg     Date Action Dose Route User   12/09/2021 1330 Given 50 mg Intravenous Cauwels, Christine, RN      diphenhydrAMINE (BENADRYL) injection 50 mg     Date Action Dose Route User   12/30/2021 1112 Given 50 mg Intravenous Cauwels, Christine, RN      DOCEtaxel (TAXOTERE) 100 mg in sodium chloride 0.9 % 250 mL chemo infusion     Date Action Dose Route User   12/09/2021 1630 Rate/Dose Change (none) Intravenous Jerold Coombe, RN   12/09/2021 1630 Rate/Dose Change (none) Intravenous Jerold Coombe, RN   12/09/2021 1615 Rate/Dose Change (none) Intravenous Jerold Coombe, RN   12/09/2021 1612 Rate/Dose Change (none) Intravenous Jerold Coombe, RN   12/09/2021 1557 Rate/Dose Change (none) Intravenous Cauwels, Christine, RN      DOCEtaxel (TAXOTERE) 80 mg in sodium chloride 0.9 % 250 mL chemo infusion     Date Action Dose Route User   12/30/2021 1257 Infusion Verify (none) Intravenous Jerold Coombe, RN   12/30/2021 1257 Rate/Dose Change (none) Intravenous Jerold Coombe, RN   12/30/2021 1257 New Bag/Given 80 mg Intravenous Cauwels, Christine, RN      fosaprepitant (EMEND) 150 mg in sodium chloride 0.9 % 145 mL IVPB     Date Action Dose Route User   01/26/2022 1239 Rate/Dose Change (none) Intravenous Caelin, Rayl, RN   01/26/2022 1239 New Bag/Given 150  mg Intravenous Marylu Lund, RN      gemcitabine (GEMZAR) 1,748 mg in sodium chloride 0.9 % 250 mL chemo infusion     Date Action Dose Route User   01/26/2022 1344 Rate/Dose Change (none) Intravenous Yitzhak, Awan, RN    01/26/2022 1344 New Bag/Given 1,748 mg Intravenous Marylu Lund, RN      oxyCODONE (Oxy IR/ROXICODONE) immediate release tablet 5 mg     Date Action Dose Route User   01/26/2022 1233 Given 5 mg Oral Marylu Lund, RN      palonosetron (ALOXI) injection 0.25 mg     Date Action Dose Route User   01/26/2022 1234 Given 0.25 mg Intravenous Marylu Lund, RN      pegfilgrastim-cbqv [facility]) injection 6 mg     Date Action Dose Route User   12/11/2021 1050 Given 6 mg Subcutaneous (Left Arm) Belva Chimes, RN      pegfilgrastim-cbqv [facility]) injection 6 mg     Date Action Dose Route User   12/31/2021 1447 Given 6 mg Subcutaneous (Left Arm) Marylu Lund, RN      ramucirumab [facility]) 600 mg in sodium chloride 0.9 % 190 mL chemo infusion     Date Action Dose Route User   12/09/2021 1404 Infusion Verify (none) Intravenous Jerold Coombe, RN   12/09/2021 1404 New Bag/Given 600 mg Intravenous Cauwels, Christine, RN      ramucirumab (CYRAMZA) 600 mg in sodium chloride 0.9 % 190 mL chemo infusion     Date Action Dose Route User   12/30/2021 1216 Infusion Verify (none) Intravenous Jerold Coombe, RN   12/30/2021 1216 New Bag/Given 600 mg Intravenous Cauwels, Christine, RN      0.9 %  sodium chloride infusion     Date Action Dose Route User   12/09/2021 1705 Rate/Dose Change (none) Intravenous Jerold Coombe, RN   12/09/2021 1657 Rate/Dose Change (none) Intravenous Jerold Coombe, RN   12/09/2021 1657 Rate/Dose Change (none) Intravenous Jerold Coombe, RN   12/09/2021 1520 Infusion Verify (none) Intravenous Jerold Coombe, RN   12/09/2021 1520 Rate/Dose Change (none) Intravenous Cauwels, Christine, RN      0.9 %  sodium chloride infusion     Date Action Dose Route User   12/30/2021 1403 Rate/Dose Change (none) Intravenous Jerold Coombe, RN   12/30/2021 1358 Rate/Dose Change (none) Intravenous Jerold Coombe, RN   12/30/2021 1256 Infusion Verify  (none) Intravenous Jerold Coombe, RN   12/30/2021 1255 Rate/Dose Change (none) Intravenous Jerold Coombe, RN   12/30/2021 1250 Rate/Dose Change (none) Intravenous Cauwels, Christine, RN      0.9 %  sodium chloride infusion     Date Action Dose Route User   01/26/2022 1506 Rate/Dose Change (none) Intravenous Dalon, Reichart, RN   01/26/2022 1459 Rate/Dose Change (none) Intravenous Marylu Lund, RN   01/26/2022 1421 Rate/Dose Change (none) Intravenous Ansen, Sayegh, RN   01/26/2022 1420 Rate/Dose Change (none) Intravenous Tyrek, Lawhorn, RN   01/26/2022 1417 Rate/Dose Change (none) Intravenous Marylu Lund, RN          Latest Ref Rng & Units 10/18/2021   11:52 AM  PFT Results  FVC-Pre L 1.64   FVC-Predicted Pre % 34   FVC-Post L 1.60   FVC-Predicted Post % 33   Pre FEV1/FVC % % 85   Post FEV1/FCV % % 89   FEV1-Pre L 1.39   FEV1-Predicted Pre % 39   FEV1-Post L 1.42   DLCO uncorrected ml/min/mmHg 10.00  DLCO UNC% % 36   DLCO corrected ml/min/mmHg 11.67   DLCO COR %Predicted % 42   DLVA Predicted % 76     No results found for: "NITRICOXIDE"      Assessment & Plan:   Non-small cell lung cancer metastatic to bone (HCC) Advanced, end stage disease with recent evidence of progression on imaging. He has had significant decline over the past few months with progressive DOE, weight loss, weakness, etc. He also minimal increase in his chronic right pleural effusion which is invading his chest wall and pleura; concerned regarding new lesions that they could be erosive cancerous lesions. Unable to rule out infection so will treat with doxy course. He now has new dizziness; although this could be side effect of new chemo regimen, recommended we check MRI of the brain for further evaluation.   I am concerned he may need NIV at night due to worsening lung disease - will check outpatient ABG. Started on duonebs - feels like nebs work better than inhalers for him. We had a very  lengthy conversation about his current respiratory status and poor prognosis. We discussed palliative care as well as eventual hospice care. He was agreeable to outpatient referral, which was placed today.   Patient Instructions  Start duoneb 3 mL every 4-6 hours as needed for shortness of breath or wheezing. Use at least twice daily in the morning and in the evening  Continue Albuterol inhaler 2 puffs every 6 hours as needed for shortness of breath or wheezing. Notify if symptoms persist despite rescue inhaler/neb use. Continue supplemental oxygen 4 lpm continuous. Use your portable continuous tanks when you are out of the house instead of your POC. Goal >88-90%  Prednisone taper. 4 tabs for 2 days, then 3 tabs for 2 days, 2 tabs for 2 days, then 1 tab for 2 days, then stop. Take in AM with food Doxycycline 1 tab Twice daily for 10 days. Take with food. Wear sunscreen when outside and avoid direct sun exposure while taking Mucinex [id number] mg Twice daily for chest congestion Flutter valve 2-3 times a day after you use your nebulizer treatment   Referral to DME for wheelchair Referral to oncology social worker for home health nurse Referral to palliative care placed today   Someone will contact you to schedule your perfusion scan, leg ultrasound, MRI of the brain, and arterial blood gas.   EKG today  Chest x ray today.  Follow up in 2 weeks with Dr. Valeta Harms. If no availability, can follow up with Katie Korynne Dols,NP. If symptoms do not improve or worsen, please contact office for sooner follow up or seek emergency care.    I spent *** minutes of dedicated to the care of this patient on the date of this encounter to include pre-visit review of records, face-to-face time with the patient discussing conditions above, post visit ordering of testing, clinical documentation with the electronic health record, making appropriate referrals as documented, and communicating necessary findings to members of  the patients care team.  Clayton Bibles, NP 02/03/2022  Pt aware and understands NP's role.

## 2022-02-03 ENCOUNTER — Encounter (HOSPITAL_COMMUNITY): Payer: Medicare Other

## 2022-02-03 ENCOUNTER — Telehealth: Payer: Self-pay

## 2022-02-03 NOTE — Telephone Encounter (Signed)
CSW left a vm with patient to assess needs.  Will follow up.

## 2022-02-03 NOTE — Telephone Encounter (Signed)
Please send through [facility] and see if we can get it that way. Thanks

## 2022-02-03 NOTE — Telephone Encounter (Signed)
Called patient back this morning and told him that we were going to try to send the medication DUONEB into his DME company. Patient verbalized understanding. Nothing further needed

## 2022-02-03 NOTE — Assessment & Plan Note (Addendum)
Advanced, end stage disease with recent evidence of progression on imaging. He has had significant decline over the past few months with progressive DOE, weight loss, weakness, etc. He also minimal increase in his chronic right pleural effusion which is invading his chest wall and pleura; concerned regarding new lesions that they could be erosive cancerous lesions. Unable to rule out infection so will treat with doxy course. He now has new dizziness; although this could be side effect of new chemo regimen, recommended we check MRI of the brain for further evaluation.   I am concerned he may need NIV at night due to worsening lung disease - will check outpatient ABG. Started on duonebs - feels like nebs work better than inhalers for him. We had a very lengthy conversation about his current respiratory status and poor prognosis. We discussed palliative care as well as eventual hospice care. He was agreeable to outpatient referral, which was placed today.   Patient Instructions  Start duoneb 3 mL every 4-6 hours as needed for shortness of breath or wheezing. Use at least twice daily in the morning and in the evening  Continue Albuterol inhaler 2 puffs every 6 hours as needed for shortness of breath or wheezing. Notify if symptoms persist despite rescue inhaler/neb use. Continue supplemental oxygen 4 lpm continuous. Use your portable continuous tanks when you are out of the house instead of your POC. Goal >88-90%  Prednisone taper. 4 tabs for 2 days, then 3 tabs for 2 days, 2 tabs for 2 days, then 1 tab for 2 days, then stop. Take in AM with food Doxycycline 1 tab Twice daily for 10 days. Take with food. Wear sunscreen when outside and avoid direct sun exposure while taking Mucinex [id number] mg Twice daily for chest congestion Flutter valve 2-3 times a day after you use your nebulizer treatment   Referral to DME for wheelchair Referral to oncology social worker for home health nurse Referral to palliative  care placed today   Someone will contact you to schedule your perfusion scan, leg ultrasound, MRI of the brain, and arterial blood gas.   EKG today  Chest x ray today.  Follow up in 2 weeks with Dr. Valeta Harms. If no availability, can follow up with Katie Itay Mella,NP. If symptoms do not improve or worsen, please contact office for sooner follow up or seek emergency care.

## 2022-02-04 ENCOUNTER — Encounter: Payer: Self-pay | Admitting: Nurse Practitioner

## 2022-02-04 DIAGNOSIS — R5381 Other malaise: Secondary | ICD-10-CM | POA: Insufficient documentation

## 2022-02-04 NOTE — Assessment & Plan Note (Signed)
History of PAF/a flutter. He had some irregularity upon auscultation today but was rate controlled. EKG showed sinus rhythm with t wave abnormality, unchanged compared to previous upon review.

## 2022-02-07 ENCOUNTER — Ambulatory Visit (HOSPITAL_COMMUNITY)
Admission: RE | Admit: 2022-02-07 | Discharge: 2022-02-07 | Disposition: A | Discharge status: Home / Self Care | Payer: Medicare Other | Source: Ambulatory Visit | Attending: Nurse Practitioner | Admitting: Nurse Practitioner

## 2022-02-07 ENCOUNTER — Telehealth: Payer: Self-pay | Admitting: Nurse Practitioner

## 2022-02-07 ENCOUNTER — Telehealth: Payer: Self-pay

## 2022-02-07 ENCOUNTER — Ambulatory Visit (HOSPITAL_BASED_OUTPATIENT_CLINIC_OR_DEPARTMENT_OTHER)
Admission: RE | Admit: 2022-02-07 | Discharge: 2022-02-07 | Disposition: A | Discharge status: Home / Self Care | Payer: Medicare Other | Source: Ambulatory Visit | Attending: Nurse Practitioner | Admitting: Nurse Practitioner

## 2022-02-07 ENCOUNTER — Encounter (HOSPITAL_COMMUNITY)
Admission: RE | Admit: 2022-02-07 | Discharge: 2022-02-07 | Disposition: A | Discharge status: Home / Self Care | Payer: Medicare Other | Source: Ambulatory Visit | Attending: Nurse Practitioner | Admitting: Nurse Practitioner

## 2022-02-07 ENCOUNTER — Other Ambulatory Visit: Payer: Self-pay | Admitting: Nurse Practitioner

## 2022-02-07 DIAGNOSIS — R42 Dizziness and giddiness: Secondary | ICD-10-CM | POA: Insufficient documentation

## 2022-02-07 DIAGNOSIS — C3491 Malignant neoplasm of unspecified part of right bronchus or lung: Secondary | ICD-10-CM | POA: Insufficient documentation

## 2022-02-07 DIAGNOSIS — C3490 Malignant neoplasm of unspecified part of unspecified bronchus or lung: Secondary | ICD-10-CM | POA: Diagnosis not present

## 2022-02-07 DIAGNOSIS — J9690 Respiratory failure, unspecified, unspecified whether with hypoxia or hypercapnia: Secondary | ICD-10-CM | POA: Diagnosis not present

## 2022-02-07 DIAGNOSIS — J439 Emphysema, unspecified: Secondary | ICD-10-CM

## 2022-02-07 DIAGNOSIS — R0602 Shortness of breath: Secondary | ICD-10-CM | POA: Insufficient documentation

## 2022-02-07 DIAGNOSIS — J90 Pleural effusion, not elsewhere classified: Secondary | ICD-10-CM | POA: Diagnosis not present

## 2022-02-07 DIAGNOSIS — R9082 White matter disease, unspecified: Secondary | ICD-10-CM | POA: Diagnosis not present

## 2022-02-07 DIAGNOSIS — J984 Other disorders of lung: Secondary | ICD-10-CM | POA: Diagnosis not present

## 2022-02-07 DIAGNOSIS — R0600 Dyspnea, unspecified: Secondary | ICD-10-CM | POA: Diagnosis not present

## 2022-02-07 LAB — BLOOD GAS, ARTERIAL
Acid-Base Excess: 12.9 mmol/L — ABNORMAL HIGH (ref 0.0–2.0)
Bicarbonate: 39.1 mmol/L — ABNORMAL HIGH (ref 20.0–28.0)
O2 Saturation: 99.2 %
Patient temperature: 37
pCO2 arterial: 55 mmHg — ABNORMAL HIGH (ref 32–48)
pH, Arterial: 7.46 — ABNORMAL HIGH (ref 7.35–7.45)
pO2, Arterial: 119 mmHg — ABNORMAL HIGH (ref 83–108)

## 2022-02-07 MED ORDER — TECHNETIUM TO 99M ALBUMIN AGGREGATED
4.2000 | Freq: Once | INTRAVENOUS | Status: AC | PRN
  Administered 2022-02-07: 4.2 via INTRAVENOUS

## 2022-02-07 NOTE — Telephone Encounter (Signed)
Attempted to contact patient to schedule a Palliative Care consult appointment. No answer left a message to return call.  

## 2022-02-07 NOTE — Progress Notes (Signed)
Patient in today for ABG on 3 LPM  nasal cannula.  Done and results in computer.

## 2022-02-07 NOTE — Progress Notes (Signed)
Bilateral LE venous duplex study completed. Please see CV Proc for preliminary results.  Assunta Pupo BS, RVT 02/07/2022 2:51 PM

## 2022-02-07 NOTE — Telephone Encounter (Signed)
Spoke with pt who states that he is not interested in having [place] order NIV for him at this time. Pt states he would call us when he is ready to order NIV. This RN tried to explain the importance of NIV but pt again stated he did not want NIV ordered at this time. Katie please advise.

## 2022-02-10 NOTE — Telephone Encounter (Signed)
Please notify patient that his MRI of his brain did not show any metastases so I suspect his dizziness is related to new chemo regimen but he should discuss further with Dr. [name]. His perfusion scan and lower extremity dopplers were negative for clots, which is good.   Please see if he is ok with [place] sending orders to the DME company for the NIV at night, which was originally why he came in to see [place]. This should help his breathing and is similar to what he had when he was in the hospital, just with more support. He declined this when called earlier this week after we received ABG results but this was the original reason for me seeing (he sent a message to Dr. Valeta Harms wanting a NIV or BiPAP at night). I am not sure if there was some confusion or if he changed his mind. Just let me know what he decides! Thanks.

## 2022-02-10 NOTE — Telephone Encounter (Signed)
Called patient but he did not answer. Left message for him to call back.  

## 2022-02-12 ENCOUNTER — Other Ambulatory Visit: Payer: Self-pay | Admitting: Oncology

## 2022-02-16 ENCOUNTER — Inpatient Hospital Stay: Payer: Medicare Other

## 2022-02-16 ENCOUNTER — Encounter: Payer: Self-pay | Admitting: Nurse Practitioner

## 2022-02-16 ENCOUNTER — Encounter: Payer: Self-pay | Admitting: *Deleted

## 2022-02-16 ENCOUNTER — Telehealth: Payer: Self-pay | Admitting: Pulmonary Disease

## 2022-02-16 ENCOUNTER — Inpatient Hospital Stay: Payer: Medicare Other | Attending: Nurse Practitioner | Admitting: Nurse Practitioner

## 2022-02-16 ENCOUNTER — Encounter: Payer: Self-pay | Admitting: Nutrition

## 2022-02-16 VITALS — BP 106/92 | HR 100 | Temp 98.2°F | Resp 18 | Ht 73.0 in | Wt 127.0 lb

## 2022-02-16 VITALS — BP 125/69 | HR 78 | Temp 98.0°F | Resp 18

## 2022-02-16 DIAGNOSIS — C3490 Malignant neoplasm of unspecified part of unspecified bronchus or lung: Secondary | ICD-10-CM | POA: Diagnosis not present

## 2022-02-16 DIAGNOSIS — Z5111 Encounter for antineoplastic chemotherapy: Secondary | ICD-10-CM | POA: Diagnosis not present

## 2022-02-16 DIAGNOSIS — R0600 Dyspnea, unspecified: Secondary | ICD-10-CM | POA: Insufficient documentation

## 2022-02-16 DIAGNOSIS — C3491 Malignant neoplasm of unspecified part of right bronchus or lung: Secondary | ICD-10-CM | POA: Diagnosis present

## 2022-02-16 DIAGNOSIS — C7951 Secondary malignant neoplasm of bone: Secondary | ICD-10-CM | POA: Insufficient documentation

## 2022-02-16 DIAGNOSIS — J910 Malignant pleural effusion: Secondary | ICD-10-CM | POA: Diagnosis not present

## 2022-02-16 LAB — CMP (CANCER CENTER ONLY)
ALT: 8 U/L (ref 0–44)
AST: 17 U/L (ref 15–41)
Albumin: 4 g/dL (ref 3.5–5.0)
Alkaline Phosphatase: 93 U/L (ref 38–126)
Anion gap: 10 (ref 5–15)
BUN: 15 mg/dL (ref 8–23)
CO2: 31 mmol/L (ref 22–32)
Calcium: 10.1 mg/dL (ref 8.9–10.3)
Chloride: 97 mmol/L — ABNORMAL LOW (ref 98–111)
Creatinine: 1 mg/dL (ref 0.61–1.24)
GFR, Estimated: >60 mL/min (ref >60)
Glucose, Bld: 132 mg/dL — ABNORMAL HIGH (ref 70–99)
Potassium: 4.3 mmol/L (ref 3.5–5.1)
Sodium: 138 mmol/L (ref 135–145)
Total Bilirubin: 0.4 mg/dL (ref 0.3–1.2)
Total Protein: 7.4 g/dL (ref 6.5–8.1)

## 2022-02-16 LAB — CBC WITH DIFFERENTIAL (CANCER CENTER ONLY)
Abs Immature Granulocytes: 0.07 10*3/uL (ref 0.00–0.07)
Basophils Absolute: 0 10*3/uL (ref 0.0–0.1)
Basophils Relative: 0 %
Eosinophils Absolute: 0 10*3/uL (ref 0.0–0.5)
Eosinophils Relative: 0 %
HCT: 32.6 % — ABNORMAL LOW (ref 39.0–52.0)
Hemoglobin: 10 g/dL — ABNORMAL LOW (ref 13.0–17.0)
Immature Granulocytes: 1 %
Lymphocytes Relative: 4 %
Lymphs Abs: 0.5 10*3/uL — ABNORMAL LOW (ref 0.7–4.0)
MCH: 29.5 pg (ref 26.0–34.0)
MCHC: 30.7 g/dL (ref 30.0–36.0)
MCV: 96.2 fL (ref 80.0–100.0)
Monocytes Absolute: 1.3 10*3/uL — ABNORMAL HIGH (ref 0.1–1.0)
Monocytes Relative: 11 %
Neutro Abs: 10.1 10*3/uL — ABNORMAL HIGH (ref 1.7–7.7)
Neutrophils Relative %: 84 %
Platelet Count: 240 10*3/uL (ref 150–400)
RBC: 3.39 MIL/uL — ABNORMAL LOW (ref 4.22–5.81)
RDW: 15.8 % — ABNORMAL HIGH (ref 11.5–15.5)
WBC Count: 12.1 10*3/uL — ABNORMAL HIGH (ref 4.0–10.5)
nRBC: 0 % (ref 0.0–0.2)

## 2022-02-16 MED ORDER — METHYLPREDNISOLONE SODIUM SUCC 125 MG IJ SOLR
125.0000 mg | Freq: Once | INTRAMUSCULAR | Status: AC
  Administered 2022-02-16: 125 mg via INTRAVENOUS
  Filled 2022-02-16: qty 2

## 2022-02-16 MED ORDER — OXYCODONE-ACETAMINOPHEN 5-325 MG PO TABS: 1.0000 | ORAL_TABLET | Freq: Four times a day (QID) | ORAL | 0 refills | Status: DC | PRN

## 2022-02-16 MED ORDER — DIPHENHYDRAMINE HCL 50 MG/ML IJ SOLN
25.0000 mg | Freq: Once | INTRAMUSCULAR | Status: AC
  Administered 2022-02-16: 25 mg via INTRAVENOUS

## 2022-02-16 MED ORDER — PALONOSETRON HCL INJECTION 0.25 MG/5ML
0.2500 mg | Freq: Once | INTRAVENOUS | Status: AC
  Administered 2022-02-16: 0.25 mg via INTRAVENOUS
  Filled 2022-02-16: qty 5

## 2022-02-16 MED ORDER — SODIUM CHLORIDE 0.9 % IV SOLN
150.0000 mg | Freq: Once | INTRAVENOUS | Status: AC
  Administered 2022-02-16: 150 mg via INTRAVENOUS
  Filled 2022-02-16: qty 150

## 2022-02-16 MED ORDER — SODIUM CHLORIDE 0.9 % IV SOLN
398.5000 mg | Freq: Once | INTRAVENOUS | Status: AC
  Administered 2022-02-16: 400 mg via INTRAVENOUS
  Filled 2022-02-16: qty 40

## 2022-02-16 MED ORDER — SODIUM CHLORIDE 0.9 % IV SOLN
10.0000 mg | Freq: Once | INTRAVENOUS | Status: AC
  Administered 2022-02-16: 10 mg via INTRAVENOUS
  Filled 2022-02-16: qty 10

## 2022-02-16 MED ORDER — SODIUM CHLORIDE 0.9 % IV SOLN: Freq: Once | INTRAVENOUS | Status: AC

## 2022-02-16 MED ORDER — SODIUM CHLORIDE 0.9 % IV SOLN
1000.0000 mg/m2 | Freq: Once | INTRAVENOUS | Status: AC
  Administered 2022-02-16: 1748 mg via INTRAVENOUS
  Filled 2022-02-16: qty 26.3

## 2022-02-16 NOTE — Telephone Encounter (Signed)
Last office visit notes have been faxed to [facility].

## 2022-02-16 NOTE — Progress Notes (Signed)
Provided one complimentary case of Ensure Plus High Protein 

## 2022-02-16 NOTE — Addendum Note (Signed)
Addended by: Owens Shark on: 02/16/2022 10:35 AM   Modules accepted: Orders

## 2022-02-16 NOTE — Progress Notes (Signed)
Patient seen by Lisa Hawkin NP today  Vitals are within treatment parameters.  Labs reviewed by Lisa Demeco NP and are within treatment parameters.  Per physician team, patient is ready for treatment and there are NO modifications to the treatment plan.     

## 2022-02-16 NOTE — Patient Instructions (Signed)
[facility]   Discharge Instructions: Thank you for choosing [facility] to provide your oncology and hematology care.   If you have a lab appointment with the [facility], please go directly to the [facility] and check in at the registration area.   Wear comfortable clothing and clothing appropriate for easy access to any Portacath or PICC line.   We strive to give you quality time with your provider. You may need to reschedule your appointment if you arrive late (15 or more minutes).  Arriving late affects you and other patients whose appointments are after yours.  Also, if you miss three or more appointments without notifying the office, you may be dismissed from the clinic at the provider's discretion.      For prescription refill requests, have your pharmacy contact our office and allow 72 hours for refills to be completed.    Today you received the following chemotherapy and/or immunotherapy agents Gemzar, Carboplatin      To help prevent nausea and vomiting after your treatment, we encourage you to take your nausea medication as directed.  BELOW ARE SYMPTOMS THAT SHOULD BE REPORTED IMMEDIATELY: *FEVER GREATER THAN 100.4 F (38 C) OR HIGHER *CHILLS OR SWEATING *NAUSEA AND VOMITING THAT IS NOT CONTROLLED WITH YOUR NAUSEA MEDICATION *UNUSUAL SHORTNESS OF BREATH *UNUSUAL BRUISING OR BLEEDING *URINARY PROBLEMS (pain or burning when urinating, or frequent urination) *BOWEL PROBLEMS (unusual diarrhea, constipation, pain near the anus) TENDERNESS IN MOUTH AND THROAT WITH OR WITHOUT PRESENCE OF ULCERS (sore throat, sores in mouth, or a toothache) UNUSUAL RASH, SWELLING OR PAIN  UNUSUAL VAGINAL DISCHARGE OR ITCHING   Items with * indicate a potential emergency and should be followed up as soon as possible or go to the Emergency Department if any problems should occur.  Please show the CHEMOTHERAPY ALERT CARD or IMMUNOTHERAPY ALERT CARD at  check-in to the Emergency Department and triage nurse.  Should you have questions after your visit or need to cancel or reschedule your appointment, please contact [facility]  Dept: [phone]  and follow the prompts.  Office hours are 8:00 a.m. to 4:30 p.m. Monday - Friday. Please note that voicemails left after 4:00 p.m. may not be returned until the following business day.  We are closed weekends and major holidays. You have access to a nurse at all times for urgent questions. Please call the main number to the clinic Dept: [phone] and follow the prompts.   For any non-urgent questions, you may also contact your provider using MyChart. We now offer e-Visits for anyone 75 and older to request care online for non-urgent symptoms. For details visit [email or web address].   Also download the MyChart app! Go to the app store, search "MyChart", open the app, select , and log in with your MyChart username and password.  Masks are optional in the cancer centers. If you would like for your care team to wear a mask while they are taking care of you, please let them know. For doctor visits, patients may have with them one support person who is at least 75 years old. At this time, visitors are not allowed in the infusion area.  Gemcitabine injection What is this medication? GEMCITABINE (jem SYE ta been) is a chemotherapy drug. This medicine is used to treat many types of cancer like breast cancer, lung cancer, pancreatic cancer, and ovarian cancer. This medicine may be used for other purposes; ask your health care provider  or pharmacist if you have questions. COMMON BRAND NAME(S): Gemzar, Infugem What should I tell my care team before I take this medication? They need to know if you have any of these conditions: blood disorders infection kidney disease liver disease lung or breathing disease, like asthma recent or ongoing radiation therapy an unusual or  allergic reaction to gemcitabine, other chemotherapy, other medicines, foods, dyes, or preservatives pregnant or trying to get pregnant breast-feeding How should I use this medication? This drug is given as an infusion into a vein. It is administered in a hospital or clinic by a specially trained health care professional. Talk to your pediatrician regarding the use of this medicine in children. Special care may be needed. Overdosage: If you think you have taken too much of this medicine contact a poison control center or emergency room at once. NOTE: This medicine is only for you. Do not share this medicine with others. What if I miss a dose? It is important not to miss your dose. Call your doctor or health care professional if you are unable to keep an appointment. What may interact with this medication? medicines to increase blood counts like filgrastim, pegfilgrastim, sargramostim some other chemotherapy drugs like cisplatin vaccines Talk to your doctor or health care professional before taking any of these medicines: acetaminophen aspirin ibuprofen ketoprofen naproxen This list may not describe all possible interactions. Give your health care provider a list of all the medicines, herbs, non-prescription drugs, or dietary supplements you use. Also tell them if you smoke, drink alcohol, or use illegal drugs. Some items may interact with your medicine. What should I watch for while using this medication? Visit your doctor for checks on your progress. This drug may make you feel generally unwell. This is not uncommon, as chemotherapy can affect healthy cells as well as cancer cells. Report any side effects. Continue your course of treatment even though you feel ill unless your doctor tells you to stop. In some cases, you may be given additional medicines to help with side effects. Follow all directions for their use. Call your doctor or health care professional for advice if you get a fever,  chills or sore throat, or other symptoms of a cold or flu. Do not treat yourself. This drug decreases your body's ability to fight infections. Try to avoid being around people who are sick. This medicine may increase your risk to bruise or bleed. Call your doctor or health care professional if you notice any unusual bleeding. Be careful brushing and flossing your teeth or using a toothpick because you may get an infection or bleed more easily. If you have any dental work done, tell your dentist you are receiving this medicine. Avoid taking products that contain aspirin, acetaminophen, ibuprofen, naproxen, or ketoprofen unless instructed by your doctor. These medicines may hide a fever. Do not become pregnant while taking this medicine or for 6 months after stopping it. Women should inform their doctor if they wish to become pregnant or think they might be pregnant. Men should not father a child while taking this medicine and for 3 months after stopping it. There is a potential for serious side effects to an unborn child. Talk to your health care professional or pharmacist for more information. Do not breast-feed an infant while taking this medicine or for at least 1 week after stopping it. Men should inform their doctors if they wish to father a child. This medicine may lower sperm counts. Talk with your doctor or health  care professional if you are concerned about your fertility. What side effects may I notice from receiving this medication? Side effects that you should report to your doctor or health care professional as soon as possible: allergic reactions like skin rash, itching or hives, swelling of the face, lips, or tongue breathing problems pain, redness, or irritation at site where injected signs and symptoms of a dangerous change in heartbeat or heart rhythm like chest pain; dizziness; fast or irregular heartbeat; palpitations; feeling faint or lightheaded, falls; breathing problems signs of  decreased platelets or bleeding - bruising, pinpoint red spots on the skin, black, tarry stools, blood in the urine signs of decreased red blood cells - unusually weak or tired, feeling faint or lightheaded, falls signs of infection - fever or chills, cough, sore throat, pain or difficulty passing urine signs and symptoms of kidney injury like trouble passing urine or change in the amount of urine signs and symptoms of liver injury like dark yellow or brown urine; general ill feeling or flu-like symptoms; light-colored stools; loss of appetite; nausea; right upper belly pain; unusually weak or tired; yellowing of the eyes or skin swelling of ankles, feet, hands Side effects that usually do not require medical attention (report to your doctor or health care professional if they continue or are bothersome): constipation diarrhea hair loss loss of appetite nausea rash vomiting This list may not describe all possible side effects. Call your doctor for medical advice about side effects. You may report side effects to FDA at 1-800-FDA-1088. Where should I keep my medication? This drug is given in a hospital or clinic and will not be stored at home. NOTE: This sheet is a summary. It may not cover all possible information. If you have questions about this medicine, talk to your doctor, pharmacist, or health care provider.  2023 Elsevier/Gold Standard (2017-10-25 00:00:00) Carboplatin injection What is this medication? CARBOPLATIN (KAR boe pla tin) is a chemotherapy drug. It targets fast dividing cells, like cancer cells, and causes these cells to die. This medicine is used to treat ovarian cancer and many other cancers. This medicine may be used for other purposes; ask your health care provider or pharmacist if you have questions. COMMON BRAND NAME(S): Paraplatin What should I tell my care team before I take this medication? They need to know if you have any of these conditions: blood  disorders hearing problems kidney disease recent or ongoing radiation therapy an unusual or allergic reaction to carboplatin, cisplatin, other chemotherapy, other medicines, foods, dyes, or preservatives pregnant or trying to get pregnant breast-feeding How should I use this medication? This drug is usually given as an infusion into a vein. It is administered in a hospital or clinic by a specially trained health care professional. Talk to your pediatrician regarding the use of this medicine in children. Special care may be needed. Overdosage: If you think you have taken too much of this medicine contact a poison control center or emergency room at once. NOTE: This medicine is only for you. Do not share this medicine with others. What if I miss a dose? It is important not to miss a dose. Call your doctor or health care professional if you are unable to keep an appointment. What may interact with this medication? medicines for seizures medicines to increase blood counts like filgrastim, pegfilgrastim, sargramostim some antibiotics like amikacin, gentamicin, neomycin, streptomycin, tobramycin vaccines Talk to your doctor or health care professional before taking any of these medicines: acetaminophen aspirin ibuprofen ketoprofen  naproxen This list may not describe all possible interactions. Give your health care provider a list of all the medicines, herbs, non-prescription drugs, or dietary supplements you use. Also tell them if you smoke, drink alcohol, or use illegal drugs. Some items may interact with your medicine. What should I watch for while using this medication? Your condition will be monitored carefully while you are receiving this medicine. You will need important blood work done while you are taking this medicine. This drug may make you feel generally unwell. This is not uncommon, as chemotherapy can affect healthy cells as well as cancer cells. Report any side effects. Continue  your course of treatment even though you feel ill unless your doctor tells you to stop. In some cases, you may be given additional medicines to help with side effects. Follow all directions for their use. Call your doctor or health care professional for advice if you get a fever, chills or sore throat, or other symptoms of a cold or flu. Do not treat yourself. This drug decreases your body's ability to fight infections. Try to avoid being around people who are sick. This medicine may increase your risk to bruise or bleed. Call your doctor or health care professional if you notice any unusual bleeding. Be careful brushing and flossing your teeth or using a toothpick because you may get an infection or bleed more easily. If you have any dental work done, tell your dentist you are receiving this medicine. Avoid taking products that contain aspirin, acetaminophen, ibuprofen, naproxen, or ketoprofen unless instructed by your doctor. These medicines may hide a fever. Do not become pregnant while taking this medicine. Women should inform their doctor if they wish to become pregnant or think they might be pregnant. There is a potential for serious side effects to an unborn child. Talk to your health care professional or pharmacist for more information. Do not breast-feed an infant while taking this medicine. What side effects may I notice from receiving this medication? Side effects that you should report to your doctor or health care professional as soon as possible: allergic reactions like skin rash, itching or hives, swelling of the face, lips, or tongue signs of infection - fever or chills, cough, sore throat, pain or difficulty passing urine signs of decreased platelets or bleeding - bruising, pinpoint red spots on the skin, black, tarry stools, nosebleeds signs of decreased red blood cells - unusually weak or tired, fainting spells, lightheadedness breathing problems changes in hearing changes in  vision chest pain high blood pressure low blood counts - This drug may decrease the number of white blood cells, red blood cells and platelets. You may be at increased risk for infections and bleeding. nausea and vomiting pain, swelling, redness or irritation at the injection site pain, tingling, numbness in the hands or feet problems with balance, talking, walking trouble passing urine or change in the amount of urine Side effects that usually do not require medical attention (report to your doctor or health care professional if they continue or are bothersome): hair loss loss of appetite metallic taste in the mouth or changes in taste This list may not describe all possible side effects. Call your doctor for medical advice about side effects. You may report side effects to FDA at 1-800-FDA-1088. Where should I keep my medication? This drug is given in a hospital or clinic and will not be stored at home. NOTE: This sheet is a summary. It may not cover all possible information. If you  have questions about this medicine, talk to your doctor, pharmacist, or health care provider.  2023 Elsevier/Gold Standard (2008-01-09 00:00:00)

## 2022-02-16 NOTE — Progress Notes (Addendum)
[facility] OFFICE PROGRESS NOTE   Diagnosis: Non-small cell lung cancer  INTERVAL HISTORY:   [name] returns as scheduled.  He completed cycle 1 carboplatin/gemcitabine 01/26/2022.  He has mild intermittent nausea.  He has periodic mouth sores.  No diarrhea or constipation.  No signs of allergic reaction.  No fever or rash.  Dyspnea is better since beginning an antibiotic, steroid and nebulizer treatments.  He has noted a mass at the right axilla.  He has a persistent mass at the right anterior lower chest wall.  He takes Percocet periodically for the pain.  Objective:  Vital signs in last 24 hours:  Blood pressure (!) 106/92, pulse 100, temperature 98.2 F (36.8 C), temperature source Oral, resp. rate 18, height _0  (1.854 m), weight 127 lb (57.6 kg), SpO2 96 %.    HEENT: No thrush or ulcers. Lymphatics: Large right axillary lymph node mass. Resp: Diminished breath sounds about the right lung field.  Increased respiratory rate.  No respiratory distress. Cardio: Irregular. GI: No hepatomegaly. Vascular: No leg edema. Skin: Persistent purplish cutaneous lesion right anterior chest wall, similar lesion just medial to the other lesion.    Lab Results:  Lab Results  Component Value Date   WBC 12.1 (H) 02/16/2022   HGB 10.0 (L) 02/16/2022   HCT 32.6 (L) 02/16/2022   MCV 96.2 02/16/2022   PLT 240 02/16/2022   NEUTROABS 10.1 (H) 02/16/2022    Imaging:  No results found.  Medications: I have reviewed the patient's current medications.  Assessment/Plan: Metastatic non-small cell lung cancer  large right pleural effusion, right lung mass, right pleural-based masses, left lung nodules Right thoracentesis 09/04/2018-negative cytology CT biopsy of right lateral pleural mass 09/07/2018- poorly differentiated non-small cell carcinoma, malignant cells positive for cytokeratin AE1/AE3 and cytokeratin 8/18, histology favoring adenocarcinoma, MSS, tumor mutation  burden 5.no EGFR, ALK, BRAF alteration.  PDL 1 rearrangement PDL 1 tumor proportion score-100%  Cycle 1 Alimta/carboplatin 09/22/2018, pembrolizumab 09/28/2018 Cycle 2 Alimta/carboplatin/pembrolizumab 10/15/2018 Cycle 3 Alimta/carboplatin/pembrolizumab 11/06/2018 Cycle 4 Alimta/carboplatin/pembrolizumab 11/28/2018 CT chest 12/14/2018- response to therapy of lung primary/primaries, thoracic nodal and pulmonary metastasis.  Decrease in right pleural effusion with improvement in pleural-based metastasis.  New T10 heterogeneous sclerotic density likely due to healing of previously CT occult metastasis. Pembrolizumab 12/19/2018 Pembrolizumab/Alimta starting 01/09/2019 Pembrolizumab/Alimta 01/31/2019 Pembrolizumab/Alimta 02/21/2019 Pembrolizumab/Alimta 03/14/2019 CT 04/01/2019- no evidence of disease progression, small loculated right pleural effusion and right basilar pleural nodularity-stable, stable left lower lobe nodule, persistent sclerosis at T10 Pembrolizumab/Alimta 04/05/2019 Pembrolizumab 05/07/2019 Pembrolizumab 05/28/2019 Pembrolizumab 06/18/2019 Pembrolizumab 07/09/2019 Chest CT 07/26/2019-moderate to large stable exudative right effusion with considerable pleural thickening.  Nodularity at the right lung base along pleural thickening similar to prior.  Stable left paramediastinal groundglass density nodule.  Stable sclerotic lesion posteriorly at the T10 vertebral level. Pembrolizumab 07/30/2019 Pembrolizumab 08/21/2019-changed to every 6-week dosing Pembrolizumab 10/11/2019 Pembrolizumab 11/22/2019 CT chest 01/03/2020-chronic appearing loculated effusion right chest with signs of pleural thickening smaller than on prior exam; similar appearance of tree-in-bud bud nodularity in the right upper lobe and left lung apex along with bronchiectatic changes in the lingula and middle lobe; similar appearance of sclerosis T10 vertebral body Pembrolizumab 01/03/2020 Pembrolizumab 02/14/2020 Pembrolizumab  03/27/2020 Chest CT 05/06/2020-unchanged posttreatment appearance of the chest with a chronic, loculated moderate right pleural effusion with associated atelectasis or consolidation and pleural thickening.  Stable small pulmonary nodules measuring 3 mm or smaller.  Pembrolizumab 05/08/2020 Pembrolizumab 06/19/2020 Pembrolizumab 08/03/2020 Pembrolizumab 09/14/2020 CT chest 11/04/2020-persistent and enlarging complex loculated  right pleural fluid collection.  No findings suspicious for recurrent tumor, mediastinal/hilar adenopathy or pulmonary metastatic disease. CT chest 02/04/2021-chronic loculated right pleural effusion, new ill-defined right upper lobe nodule-nonspecific, coronary and aortic valve calcifications  (next scan planned at a 52-monthinterval) CT chest 05/21/2021-substantial increase peripheral airspace opacity in the right upper lobe and superior segment right lower lobe.  Appearance suspicious for multi lobar pneumonia.  Stable thick-walled chronic large right pleural effusion.  New part solid 1.2 cm superior segment left lower lobe nodule.  Stable left upper lobe groundglass density pulmonary nodule.  Increased bandlike nodular opacity laterally in the left lower lobe. 10-day course of Levaquin   Chest x-ray 06/09/2021-new patchy airspace disease left upper lobe, right upper lobe airspace disease has minimally decreased but not resolved.  Stable loculated right pleural effusion Thoracentesis 07/06/2021-350 cc of thick bloody fluid removed.  Cytology with atypical cells present.  Culture with no growth. CT chest 07/26/2021-progressive ill-defined nodular airspace process in the left lower lobe with hazy surrounding groundglass opacity.  Significant progression of ill-defined groundglass opacity and slight interstitial thickening left upper lobe.  Improved appearance right upper lobe.  Stable thick-walled loculated right pleural fluid collection.  New small left pleural effusion.  Stable underlying  lung disease.  No mediastinal or hilar mass or adenopathy. Pembrolizumab 07/30/2021 Pembrolizumab 08/20/2021 CT chest 08/26/2021-new left upper lobe airspace disease with interlobar septal thickening, new airspace disease in the superior segment of the left lower lobe, minimal patchy groundglass opacities in the right upper lobe, increased left pleural effusion, new prominent prevascular node Antibiotics and steroid taper with clinical improvement Bronchoscopy with biopsies and bronchoalveolar lavage from the left upper lobe 08/30/2021-culture negative, transbronchial biopsy and brushing negative for malignancy MRI lumbar spine 11/01/2021-5.1 cm tumor extending from the posterior L4 body into the spinal canal and caudally infiltrating the posterior L4-L5 disc and L5 superior endplate.  Subsequent severe malignant spinal lateral recess and left foraminal stenosis at L4-L5.  Query left L4 and/or L5 radiculitis.  Noncontiguous indeterminate 2.5 cm mass within the medial left psoas muscle at the L4 level with mild surrounding muscle edema. Radiation to lumbar spine at L4-5 and including mass in the left psoas muscle at the level of L4 11/08/2021 - 11/19/2021 PET 11/25/2021-extensive pleural at the right hemithorax with pleural lesions invading intercostal muscles and eroding adjacent ribs, small left pleural effusion, bilateral pulmonary metastases, osseous metastatic disease at L4/5 invading the spinal canal, destructive lesion of the left humeral head Cycle 1 docetaxel/ramucirumab 12/09/2021 Cycle 2 docetaxel/ramucirumab 12/30/2021, docetaxel dose reduced secondary to mucositis Cycle 1 carboplatin/gemcitabine 01/26/2022 Cycle 2 carboplatin/gemcitabine 02/16/2022   2.  Severe anemia-likely secondary to bleeding in the right pleural space and metastatic carcinoma, red cell transfusions 09/09/2018 09/10/2018, 09/23/2018-improved; progressive 04/26/2019, red cell transfusion.  Improved. 3.  Dyspnea secondary to the large  right pleural effusion and anemia Right Pleurx catheter placed 09/11/2018 Chest x-ray 10/09/2018- large right pleural effusion, loculated Chest x-ray 11/06/2018- no interval change in the right-sided pleural effusion Pleurx drainage catheter removed 11/13/2018 4.  History of tobacco use 5.  History of kidney stones 6.  Fever-most likely tumor fever, improved 7.  Admission 10/08/2018 with rapid atrial fibrillation/flutter- improved with diltiazem, started on Eliquis anticoagulation.  Eliquis discontinued.  Now on aspirin. 8.  Renal insufficiency 9.  Admission 08/26/2021 with increased dyspnea, CT with progressive left chest airspace disease, clinical improvement following antibiotics and steroids 10.  Left leg pain and weakness February 2023  Disposition: Mr. KVanamanappears unchanged.  He has completed 1 cycle of carboplatin/gemcitabine.  Overall he seems to have tolerated the chemotherapy well.  Plan to proceed with cycle 2 today as scheduled.  CBC reviewed.  Counts adequate to proceed with treatment.  He will return for lab, follow-up, cycle 3 carboplatin/gemcitabine in 3 weeks.  He will contact the office in the interim with any problems.    Shafiq Larch ANP/GNP-BC   02/16/2022  9:47 AM  Addendum 1:53 PM-at completion of the carboplatin infusion Mr. Thayer complained of red and itchy palms.  Examination shows palms are erythematous; scattered erythema on the back and scalp; cheeks appear flushed; no wheezing; breath sounds diminished right lung field, no respiratory distress.  Vital signs stable.  He has received Benadryl and Solu-Medrol.  The rash and pruritus resolved.  He was discharged home in stable condition.

## 2022-02-16 NOTE — Progress Notes (Signed)
Hypersensitivity Reaction note  Date of event: 02/16/22 Time of event: 1315 Generic name of drug involved: Carboplatin Name of provider notified of the hypersensitivity reaction: Leander Rams, NP Was agent that likely caused hypersensitivity reaction added to Allergies List within EMR? yes Chain of events including reaction signs/symptoms, treatment administered, and outcome (e.g., drug resumed; drug discontinued; sent to Emergency Department; etc.) Carboplatin infusion completed and was ready to flush patient started to complain of itching on his palms, redness noted on palms and cheeks. Infusion stopped and NS started at 999. Ned Card, NP notified and vital signs obtained and documented on flow sheet. Benadryl 25 mg ordered per Leander Rams, NP. Leander Rams, NP at chairside and Solumedrol 125 mg ordered at 1345. Pt noted to have red splotches on his back, pt stated that his back was itching prior to coming to infusion center this morning.  Patient observed, Ned Card, NP back at chairside assessed patient at 1435. Ok to discharge home.  Marylu Lund, RN 02/16/2022 3:03 PM

## 2022-02-17 ENCOUNTER — Telehealth: Payer: Self-pay

## 2022-02-17 NOTE — Telephone Encounter (Signed)
Attempted to contact patient to schedule a Palliative Care consult appointment. No answer left a message to return call.  

## 2022-02-18 ENCOUNTER — Encounter (HOSPITAL_BASED_OUTPATIENT_CLINIC_OR_DEPARTMENT_OTHER): Payer: Medicare Other

## 2022-02-18 ENCOUNTER — Telehealth: Payer: Self-pay | Admitting: Pulmonary Disease

## 2022-02-18 DIAGNOSIS — J439 Emphysema, unspecified: Secondary | ICD-10-CM

## 2022-02-18 NOTE — Telephone Encounter (Signed)
ATC patient.  No answer.  LVM to return call on Monday after 8 am.

## 2022-02-18 NOTE — Telephone Encounter (Signed)
ATC x2, LVM to return call on Monday after 8 am.

## 2022-02-22 MED ORDER — IPRATROPIUM-ALBUTEROL 0.5-2.5 (3) MG/3ML IN SOLN: 3.0000 mL | RESPIRATORY_TRACT | 1 refills | Status: AC | PRN

## 2022-02-22 NOTE — Telephone Encounter (Signed)
Electronic RX has been sent to [facility].

## 2022-02-22 NOTE — Telephone Encounter (Signed)
Arianna from [facility]. States needs quantity for Duoneb. Arianna phone number is [phone].

## 2022-03-01 ENCOUNTER — Telehealth: Payer: Self-pay | Admitting: Nurse Practitioner

## 2022-03-01 DIAGNOSIS — J9611 Chronic respiratory failure with hypoxia: Secondary | ICD-10-CM

## 2022-03-02 NOTE — Telephone Encounter (Signed)
Called and left voicemail for patient to call office back  

## 2022-03-02 NOTE — Telephone Encounter (Signed)
Called patient and he states that he is wanting a bipap to be ordered for him. He states he does not want to do the NIV. He states that he wants the bipap to be ordered.  I advised him that a sleep study was not ordered for him, he states that he does not want to do a home sleep study.   Please advise.

## 2022-03-03 ENCOUNTER — Telehealth: Payer: Self-pay | Admitting: Family Medicine

## 2022-03-03 NOTE — Telephone Encounter (Signed)
Phone call placed to patient to offer to schedule a visit with [facility]. Phone rang, with no answer I left a voicemail for call back. 

## 2022-03-03 NOTE — Telephone Encounter (Signed)
If he would prefer BiPAP, that is fine. Please send orders for BiPAP IPAP max 12, EPAP min 6, PS 4 for chronic respiratory failure with hypoxia and hypercarbia. Thanks.

## 2022-03-03 NOTE — Telephone Encounter (Signed)
Per DPR left detailed VM on patients phone letting him know that the order for his bipap has been placed.   Nothing further needed.

## 2022-03-04 ENCOUNTER — Telehealth: Payer: Self-pay | Admitting: Family Medicine

## 2022-03-04 NOTE — Telephone Encounter (Signed)
Patient received our call regarding scheduling [facility] visit. He returned called and stated he wants to think about it and give [place] a call back.

## 2022-03-06 ENCOUNTER — Other Ambulatory Visit: Payer: Self-pay | Admitting: Oncology

## 2022-03-07 ENCOUNTER — Other Ambulatory Visit: Payer: Self-pay

## 2022-03-09 ENCOUNTER — Other Ambulatory Visit: Payer: Self-pay

## 2022-03-09 ENCOUNTER — Inpatient Hospital Stay: Payer: Medicare Other

## 2022-03-09 ENCOUNTER — Inpatient Hospital Stay (HOSPITAL_BASED_OUTPATIENT_CLINIC_OR_DEPARTMENT_OTHER): Payer: Medicare Other | Admitting: Oncology

## 2022-03-09 ENCOUNTER — Encounter: Payer: Self-pay | Admitting: *Deleted

## 2022-03-09 VITALS — BP 115/70 | HR 100 | Temp 98.1°F | Resp 18 | Ht 73.0 in | Wt 129.8 lb

## 2022-03-09 DIAGNOSIS — J910 Malignant pleural effusion: Secondary | ICD-10-CM | POA: Diagnosis not present

## 2022-03-09 DIAGNOSIS — C3490 Malignant neoplasm of unspecified part of unspecified bronchus or lung: Secondary | ICD-10-CM

## 2022-03-09 DIAGNOSIS — R0600 Dyspnea, unspecified: Secondary | ICD-10-CM | POA: Diagnosis not present

## 2022-03-09 DIAGNOSIS — C7951 Secondary malignant neoplasm of bone: Secondary | ICD-10-CM

## 2022-03-09 DIAGNOSIS — Z5111 Encounter for antineoplastic chemotherapy: Secondary | ICD-10-CM | POA: Diagnosis not present

## 2022-03-09 LAB — CMP (CANCER CENTER ONLY)
ALT: 8 U/L (ref 0–44)
AST: 18 U/L (ref 15–41)
Albumin: 4 g/dL (ref 3.5–5.0)
Alkaline Phosphatase: 96 U/L (ref 38–126)
Anion gap: 9 (ref 5–15)
BUN: 18 mg/dL (ref 8–23)
CO2: 34 mmol/L — ABNORMAL HIGH (ref 22–32)
Calcium: 10.2 mg/dL (ref 8.9–10.3)
Chloride: 95 mmol/L — ABNORMAL LOW (ref 98–111)
Creatinine: 0.98 mg/dL (ref 0.61–1.24)
GFR, Estimated: >60 mL/min (ref >60)
Glucose, Bld: 163 mg/dL — ABNORMAL HIGH (ref 70–99)
Potassium: 4 mmol/L (ref 3.5–5.1)
Sodium: 138 mmol/L (ref 135–145)
Total Bilirubin: 0.3 mg/dL (ref 0.3–1.2)
Total Protein: 7.2 g/dL (ref 6.5–8.1)

## 2022-03-09 LAB — CBC WITH DIFFERENTIAL (CANCER CENTER ONLY)
Abs Immature Granulocytes: 0.26 K/uL — ABNORMAL HIGH (ref 0.00–0.07)
Basophils Absolute: 0 K/uL (ref 0.0–0.1)
Basophils Relative: 0 %
Eosinophils Absolute: 0 K/uL (ref 0.0–0.5)
Eosinophils Relative: 0 %
HCT: 28.3 % — ABNORMAL LOW (ref 39.0–52.0)
Hemoglobin: 8.8 g/dL — ABNORMAL LOW (ref 13.0–17.0)
Immature Granulocytes: 2 %
Lymphocytes Relative: 3 %
Lymphs Abs: 0.5 K/uL — ABNORMAL LOW (ref 0.7–4.0)
MCH: 30.1 pg (ref 26.0–34.0)
MCHC: 31.1 g/dL (ref 30.0–36.0)
MCV: 96.9 fL (ref 80.0–100.0)
Monocytes Absolute: 1.2 K/uL — ABNORMAL HIGH (ref 0.1–1.0)
Monocytes Relative: 7 %
Neutro Abs: 15.3 K/uL — ABNORMAL HIGH (ref 1.7–7.7)
Neutrophils Relative %: 88 %
Platelet Count: 355 K/uL (ref 150–400)
RBC: 2.92 MIL/uL — ABNORMAL LOW (ref 4.22–5.81)
RDW: 16.2 % — ABNORMAL HIGH (ref 11.5–15.5)
WBC Count: 17.4 K/uL — ABNORMAL HIGH (ref 4.0–10.5)
nRBC: 0 % (ref 0.0–0.2)

## 2022-03-09 MED ORDER — OXYCODONE-ACETAMINOPHEN 5-325 MG PO TABS: 1.0000 | ORAL_TABLET | ORAL | 0 refills | Status: AC | PRN

## 2022-03-09 NOTE — Progress Notes (Signed)
[facility] OFFICE PROGRESS NOTE   Diagnosis: Non-small cell lung cancer  INTERVAL HISTORY:   [name] completed another cycle of gemcitabine/carboplatin 02/16/2022.  He developed erythema and pruritus of the palms, scalp, and back at the completion of the carboplatin infusion.  The symptoms resolved after Benadryl and Solu-Medrol.  He complains of increased dyspnea.  He has limited ability to ambulate.  He has increased pain in the right axilla and chest wall.  The nodule at the right low anterolateral chest wall has not decreased in size.  He is here today with her friend, Brian Ramirez.  Objective:  Vital signs in last 24 hours:  Blood pressure 115/70, pulse 100, temperature 98.1 F (36.7 C), temperature source Oral, resp. rate 18, height 6' 1" (1.854 m), weight 129 lb 12.8 oz (58.9 kg), SpO2 96 %.    HEENT: No thrush Lymphatics: Large mass in the right axilla Resp: Decreased breath sounds at the right lower chest posteriorly, and left upper anterior chest Cardio: Irregular GI: No hepatosplenomegaly Vascular: No leg edema Musculoskeletal: 3-4 cm purple firm mass at the right low anterolateral chest wall  Lab Results:  Lab Results  Component Value Date   WBC 17.4 (H) 03/09/2022   HGB 8.8 (L) 03/09/2022   HCT 28.3 (L) 03/09/2022   MCV 96.9 03/09/2022   PLT 355 03/09/2022   NEUTROABS 15.3 (H) 03/09/2022    CMP  Lab Results  Component Value Date   NA 138 03/09/2022   K 4.0 03/09/2022   CL 95 (L) 03/09/2022   CO2 34 (H) 03/09/2022   GLUCOSE 163 (H) 03/09/2022   BUN 18 03/09/2022   CREATININE 0.98 03/09/2022   CALCIUM 10.2 03/09/2022   PROT 7.2 03/09/2022   ALBUMIN 4.0 03/09/2022   AST 18 03/09/2022   ALT 8 03/09/2022   ALKPHOS 96 03/09/2022   BILITOT 0.3 03/09/2022   GFRNONAA >60 03/09/2022   GFRAA 46 (L) 05/08/2020     Medications: I have reviewed the patient's current medications.   Assessment/Plan: Metastatic non-small cell lung  cancer  large right pleural effusion, right lung mass, right pleural-based masses, left lung nodules Right thoracentesis 09/04/2018-negative cytology CT biopsy of right lateral pleural mass 09/07/2018- poorly differentiated non-small cell carcinoma, malignant cells positive for cytokeratin AE1/AE3 and cytokeratin 8/18, histology favoring adenocarcinoma, MSS, tumor mutation burden 5.no EGFR, ALK, BRAF alteration.  PDL 1 rearrangement PDL 1 tumor proportion score-100%  Cycle 1 Alimta/carboplatin 09/22/2018, pembrolizumab 09/28/2018 Cycle 2 Alimta/carboplatin/pembrolizumab 10/15/2018 Cycle 3 Alimta/carboplatin/pembrolizumab 11/06/2018 Cycle 4 Alimta/carboplatin/pembrolizumab 11/28/2018 CT chest 12/14/2018- response to therapy of lung primary/primaries, thoracic nodal and pulmonary metastasis.  Decrease in right pleural effusion with improvement in pleural-based metastasis.  New T10 heterogeneous sclerotic density likely due to healing of previously CT occult metastasis. Pembrolizumab 12/19/2018 Pembrolizumab/Alimta starting 01/09/2019 Pembrolizumab/Alimta 01/31/2019 Pembrolizumab/Alimta 02/21/2019 Pembrolizumab/Alimta 03/14/2019 CT 04/01/2019- no evidence of disease progression, small loculated right pleural effusion and right basilar pleural nodularity-stable, stable left lower lobe nodule, persistent sclerosis at T10 Pembrolizumab/Alimta 04/05/2019 Pembrolizumab 05/07/2019 Pembrolizumab 05/28/2019 Pembrolizumab 06/18/2019 Pembrolizumab 07/09/2019 Chest CT 07/26/2019-moderate to large stable exudative right effusion with considerable pleural thickening.  Nodularity at the right lung base along pleural thickening similar to prior.  Stable left paramediastinal groundglass density nodule.  Stable sclerotic lesion posteriorly at the T10 vertebral level. Pembrolizumab 07/30/2019 Pembrolizumab 08/21/2019-changed to every 6-week dosing Pembrolizumab 10/11/2019 Pembrolizumab 11/22/2019 CT chest 01/03/2020-chronic appearing  loculated effusion right chest with signs of pleural thickening smaller than on prior exam; similar appearance of tree-in-bud  bud nodularity in the right upper lobe and left lung apex along with bronchiectatic changes in the lingula and middle lobe; similar appearance of sclerosis T10 vertebral body Pembrolizumab 01/03/2020 Pembrolizumab 02/14/2020 Pembrolizumab 03/27/2020 Chest CT 05/06/2020-unchanged posttreatment appearance of the chest with a chronic, loculated moderate right pleural effusion with associated atelectasis or consolidation and pleural thickening.  Stable small pulmonary nodules measuring 3 mm or smaller.  Pembrolizumab 05/08/2020 Pembrolizumab 06/19/2020 Pembrolizumab 08/03/2020 Pembrolizumab 09/14/2020 CT chest 11/04/2020-persistent and enlarging complex loculated right pleural fluid collection.  No findings suspicious for recurrent tumor, mediastinal/hilar adenopathy or pulmonary metastatic disease. CT chest 02/04/2021-chronic loculated right pleural effusion, new ill-defined right upper lobe nodule-nonspecific, coronary and aortic valve calcifications  (next scan planned at a 46-monthinterval) CT chest 05/21/2021-substantial increase peripheral airspace opacity in the right upper lobe and superior segment right lower lobe.  Appearance suspicious for multi lobar pneumonia.  Stable thick-walled chronic large right pleural effusion.  New part solid 1.2 cm superior segment left lower lobe nodule.  Stable left upper lobe groundglass density pulmonary nodule.  Increased bandlike nodular opacity laterally in the left lower lobe. 10-day course of Levaquin   Chest x-ray 06/09/2021-new patchy airspace disease left upper lobe, right upper lobe airspace disease has minimally decreased but not resolved.  Stable loculated right pleural effusion Thoracentesis 07/06/2021-350 cc of thick bloody fluid removed.  Cytology with atypical cells present.  Culture with no growth. CT chest 07/26/2021-progressive  ill-defined nodular airspace process in the left lower lobe with hazy surrounding groundglass opacity.  Significant progression of ill-defined groundglass opacity and slight interstitial thickening left upper lobe.  Improved appearance right upper lobe.  Stable thick-walled loculated right pleural fluid collection.  New small left pleural effusion.  Stable underlying lung disease.  No mediastinal or hilar mass or adenopathy. Pembrolizumab 07/30/2021 Pembrolizumab 08/20/2021 CT chest 08/26/2021-new left upper lobe airspace disease with interlobar septal thickening, new airspace disease in the superior segment of the left lower lobe, minimal patchy groundglass opacities in the right upper lobe, increased left pleural effusion, new prominent prevascular node Antibiotics and steroid taper with clinical improvement Bronchoscopy with biopsies and bronchoalveolar lavage from the left upper lobe 08/30/2021-culture negative, transbronchial biopsy and brushing negative for malignancy MRI lumbar spine 11/01/2021-5.1 cm tumor extending from the posterior L4 body into the spinal canal and caudally infiltrating the posterior L4-L5 disc and L5 superior endplate.  Subsequent severe malignant spinal lateral recess and left foraminal stenosis at L4-L5.  Query left L4 and/or L5 radiculitis.  Noncontiguous indeterminate 2.5 cm mass within the medial left psoas muscle at the L4 level with mild surrounding muscle edema. Radiation to lumbar spine at L4-5 and including mass in the left psoas muscle at the level of L4 11/08/2021 - 11/19/2021 PET 11/25/2021-extensive pleural at the right hemithorax with pleural lesions invading intercostal muscles and eroding adjacent ribs, small left pleural effusion, bilateral pulmonary metastases, osseous metastatic disease at L4/5 invading the spinal canal, destructive lesion of the left humeral head Cycle 1 docetaxel/ramucirumab 12/09/2021 Cycle 2 docetaxel/ramucirumab 12/30/2021, docetaxel dose reduced  secondary to mucositis Cycle 1 carboplatin/gemcitabine 01/26/2022 Cycle 2 carboplatin/gemcitabine 02/16/2022   2.  Severe anemia-likely secondary to bleeding in the right pleural space and metastatic carcinoma, red cell transfusions 09/09/2018 09/10/2018, 09/23/2018-improved; progressive 04/26/2019, red cell transfusion.  Improved. 3.  Dyspnea secondary to the large right pleural effusion and anemia Right Pleurx catheter placed 09/11/2018 Chest x-ray 10/09/2018- large right pleural effusion, loculated Chest x-ray 11/06/2018- no interval change in the right-sided pleural effusion Pleurx  drainage catheter removed 11/13/2018 4.  History of tobacco use 5.  History of kidney stones 6.  Fever-most likely tumor fever, improved 7.  Admission 10/08/2018 with rapid atrial fibrillation/flutter- improved with diltiazem, started on Eliquis anticoagulation.  Eliquis discontinued.  Now on aspirin. 8.  Renal insufficiency 9.  Admission 08/26/2021 with increased dyspnea, CT with progressive left chest airspace disease, clinical improvement following antibiotics and steroids 10.  Left leg pain and weakness February 2023 11.  Rash/pruritus following carboplatin infusion 02/16/2022    Disposition: [name] has metastatic non-small cell lung cancer.  He has completed 2 cycles of salvage therapy with carboplatin/gemcitabine.  There is clinical evidence of disease progression.  He is currently on third line treatment.  I discussed the small potential benefit of additional systemic therapy versus hospice/comfort care with him.  He agrees to home hospice care.  Chemotherapy will be discontinued.  We will increase the oxycodone to 5-10 mg every 4 hours as needed for pain.  He will be [facility] to [facility] for home hosp[facility] care.  He will continue home oxygen support.  He stated that his friend, Brian Ramirez, can communicate with [place] if he is unable.  We discussed CPR and ACLS.  He will be placed on a no CODE BLUE  status.  [name] will return for an office visit in 2 weeks.  Betsy Coder, MD  03/09/2022  10:22 AM

## 2022-03-09 NOTE — Progress Notes (Signed)
Per Dr Clementeen Graham hospice referral made with DNR.

## 2022-03-09 NOTE — Progress Notes (Signed)
Patient seen by Dr. [name] today  Vitals are within treatment parameters.  Labs reviewed by Dr. [name] and are within treatment parameters.  Per physician team, patient will not be receiving treatment today. Hospice referral placed.

## 2022-03-10 ENCOUNTER — Other Ambulatory Visit: Payer: Self-pay

## 2022-03-11 ENCOUNTER — Telehealth: Payer: Self-pay

## 2022-03-11 NOTE — Telephone Encounter (Signed)
Mrs cliff damiani called in and left a messenger stated he need to spoke with someone about his brother. I called M. Slatter back and his wife answer and stated he was not in. I advice the wife I will call back Monday.

## 2022-03-11 NOTE — Telephone Encounter (Signed)
Sherrie DeCosmo from [employer] called to see if Dr [name] will be the patient attended. Per Dr. [name] he will be the attended and [place] stated a nurse will be out on 03/14/22 to meet with the patient.

## 2022-03-14 DIAGNOSIS — I509 Heart failure, unspecified: Secondary | ICD-10-CM | POA: Diagnosis not present

## 2022-03-14 DIAGNOSIS — I480 Paroxysmal atrial fibrillation: Secondary | ICD-10-CM | POA: Diagnosis not present

## 2022-03-14 DIAGNOSIS — Z87891 Personal history of nicotine dependence: Secondary | ICD-10-CM | POA: Diagnosis not present

## 2022-03-14 DIAGNOSIS — C7951 Secondary malignant neoplasm of bone: Secondary | ICD-10-CM | POA: Diagnosis not present

## 2022-03-14 DIAGNOSIS — I110 Hypertensive heart disease with heart failure: Secondary | ICD-10-CM | POA: Diagnosis not present

## 2022-03-14 DIAGNOSIS — D630 Anemia in neoplastic disease: Secondary | ICD-10-CM | POA: Diagnosis not present

## 2022-03-14 DIAGNOSIS — I2510 Atherosclerotic heart disease of native coronary artery without angina pectoris: Secondary | ICD-10-CM | POA: Diagnosis not present

## 2022-03-14 DIAGNOSIS — C3491 Malignant neoplasm of unspecified part of right bronchus or lung: Secondary | ICD-10-CM | POA: Diagnosis not present

## 2022-03-14 DIAGNOSIS — Z9981 Dependence on supplemental oxygen: Secondary | ICD-10-CM | POA: Diagnosis not present

## 2022-03-15 ENCOUNTER — Other Ambulatory Visit: Payer: Self-pay

## 2022-03-15 DIAGNOSIS — Z9981 Dependence on supplemental oxygen: Secondary | ICD-10-CM | POA: Diagnosis not present

## 2022-03-15 DIAGNOSIS — I110 Hypertensive heart disease with heart failure: Secondary | ICD-10-CM | POA: Diagnosis not present

## 2022-03-15 DIAGNOSIS — D630 Anemia in neoplastic disease: Secondary | ICD-10-CM | POA: Diagnosis not present

## 2022-03-15 DIAGNOSIS — I2510 Atherosclerotic heart disease of native coronary artery without angina pectoris: Secondary | ICD-10-CM | POA: Diagnosis not present

## 2022-03-15 DIAGNOSIS — Z87891 Personal history of nicotine dependence: Secondary | ICD-10-CM | POA: Diagnosis not present

## 2022-03-15 DIAGNOSIS — C3491 Malignant neoplasm of unspecified part of right bronchus or lung: Secondary | ICD-10-CM | POA: Diagnosis not present

## 2022-03-15 DIAGNOSIS — I509 Heart failure, unspecified: Secondary | ICD-10-CM | POA: Diagnosis not present

## 2022-03-15 DIAGNOSIS — I480 Paroxysmal atrial fibrillation: Secondary | ICD-10-CM | POA: Diagnosis not present

## 2022-03-15 DIAGNOSIS — C7951 Secondary malignant neoplasm of bone: Secondary | ICD-10-CM | POA: Diagnosis not present

## 2022-03-16 DIAGNOSIS — C7951 Secondary malignant neoplasm of bone: Secondary | ICD-10-CM | POA: Diagnosis not present

## 2022-03-16 DIAGNOSIS — I2510 Atherosclerotic heart disease of native coronary artery without angina pectoris: Secondary | ICD-10-CM | POA: Diagnosis not present

## 2022-03-16 DIAGNOSIS — C3491 Malignant neoplasm of unspecified part of right bronchus or lung: Secondary | ICD-10-CM | POA: Diagnosis not present

## 2022-03-16 DIAGNOSIS — I480 Paroxysmal atrial fibrillation: Secondary | ICD-10-CM | POA: Diagnosis not present

## 2022-03-16 DIAGNOSIS — I509 Heart failure, unspecified: Secondary | ICD-10-CM | POA: Diagnosis not present

## 2022-03-16 DIAGNOSIS — I110 Hypertensive heart disease with heart failure: Secondary | ICD-10-CM | POA: Diagnosis not present

## 2022-03-19 DIAGNOSIS — I2510 Atherosclerotic heart disease of native coronary artery without angina pectoris: Secondary | ICD-10-CM | POA: Diagnosis not present

## 2022-03-19 DIAGNOSIS — I480 Paroxysmal atrial fibrillation: Secondary | ICD-10-CM | POA: Diagnosis not present

## 2022-03-19 DIAGNOSIS — C3491 Malignant neoplasm of unspecified part of right bronchus or lung: Secondary | ICD-10-CM | POA: Diagnosis not present

## 2022-03-19 DIAGNOSIS — I110 Hypertensive heart disease with heart failure: Secondary | ICD-10-CM | POA: Diagnosis not present

## 2022-03-19 DIAGNOSIS — C7951 Secondary malignant neoplasm of bone: Secondary | ICD-10-CM | POA: Diagnosis not present

## 2022-03-19 DIAGNOSIS — I509 Heart failure, unspecified: Secondary | ICD-10-CM | POA: Diagnosis not present

## 2022-03-22 DIAGNOSIS — I110 Hypertensive heart disease with heart failure: Secondary | ICD-10-CM | POA: Diagnosis not present

## 2022-03-22 DIAGNOSIS — C7951 Secondary malignant neoplasm of bone: Secondary | ICD-10-CM | POA: Diagnosis not present

## 2022-03-22 DIAGNOSIS — I2510 Atherosclerotic heart disease of native coronary artery without angina pectoris: Secondary | ICD-10-CM | POA: Diagnosis not present

## 2022-03-22 DIAGNOSIS — I480 Paroxysmal atrial fibrillation: Secondary | ICD-10-CM | POA: Diagnosis not present

## 2022-03-22 DIAGNOSIS — I509 Heart failure, unspecified: Secondary | ICD-10-CM | POA: Diagnosis not present

## 2022-03-22 DIAGNOSIS — C3491 Malignant neoplasm of unspecified part of right bronchus or lung: Secondary | ICD-10-CM | POA: Diagnosis not present

## 2022-03-25 DIAGNOSIS — C3491 Malignant neoplasm of unspecified part of right bronchus or lung: Secondary | ICD-10-CM | POA: Diagnosis not present

## 2022-03-25 DIAGNOSIS — I480 Paroxysmal atrial fibrillation: Secondary | ICD-10-CM | POA: Diagnosis not present

## 2022-03-25 DIAGNOSIS — C7951 Secondary malignant neoplasm of bone: Secondary | ICD-10-CM | POA: Diagnosis not present

## 2022-03-25 DIAGNOSIS — I110 Hypertensive heart disease with heart failure: Secondary | ICD-10-CM | POA: Diagnosis not present

## 2022-03-25 DIAGNOSIS — I2510 Atherosclerotic heart disease of native coronary artery without angina pectoris: Secondary | ICD-10-CM | POA: Diagnosis not present

## 2022-03-25 DIAGNOSIS — I509 Heart failure, unspecified: Secondary | ICD-10-CM | POA: Diagnosis not present

## 2022-03-26 ENCOUNTER — Other Ambulatory Visit: Payer: Self-pay | Admitting: Oncology

## 2022-03-26 DIAGNOSIS — I110 Hypertensive heart disease with heart failure: Secondary | ICD-10-CM | POA: Diagnosis not present

## 2022-03-26 DIAGNOSIS — I480 Paroxysmal atrial fibrillation: Secondary | ICD-10-CM | POA: Diagnosis not present

## 2022-03-26 DIAGNOSIS — C7951 Secondary malignant neoplasm of bone: Secondary | ICD-10-CM | POA: Diagnosis not present

## 2022-03-26 DIAGNOSIS — C3491 Malignant neoplasm of unspecified part of right bronchus or lung: Secondary | ICD-10-CM | POA: Diagnosis not present

## 2022-03-26 DIAGNOSIS — I2510 Atherosclerotic heart disease of native coronary artery without angina pectoris: Secondary | ICD-10-CM | POA: Diagnosis not present

## 2022-03-26 DIAGNOSIS — I509 Heart failure, unspecified: Secondary | ICD-10-CM | POA: Diagnosis not present

## 2022-03-28 DIAGNOSIS — I2510 Atherosclerotic heart disease of native coronary artery without angina pectoris: Secondary | ICD-10-CM | POA: Diagnosis not present

## 2022-03-28 DIAGNOSIS — I480 Paroxysmal atrial fibrillation: Secondary | ICD-10-CM | POA: Diagnosis not present

## 2022-03-28 DIAGNOSIS — C3491 Malignant neoplasm of unspecified part of right bronchus or lung: Secondary | ICD-10-CM | POA: Diagnosis not present

## 2022-03-28 DIAGNOSIS — I509 Heart failure, unspecified: Secondary | ICD-10-CM | POA: Diagnosis not present

## 2022-03-28 DIAGNOSIS — C7951 Secondary malignant neoplasm of bone: Secondary | ICD-10-CM | POA: Diagnosis not present

## 2022-03-28 DIAGNOSIS — I110 Hypertensive heart disease with heart failure: Secondary | ICD-10-CM | POA: Diagnosis not present

## 2022-03-29 DIAGNOSIS — I480 Paroxysmal atrial fibrillation: Secondary | ICD-10-CM | POA: Diagnosis not present

## 2022-03-29 DIAGNOSIS — C7951 Secondary malignant neoplasm of bone: Secondary | ICD-10-CM | POA: Diagnosis not present

## 2022-03-29 DIAGNOSIS — C3491 Malignant neoplasm of unspecified part of right bronchus or lung: Secondary | ICD-10-CM | POA: Diagnosis not present

## 2022-03-29 DIAGNOSIS — I2510 Atherosclerotic heart disease of native coronary artery without angina pectoris: Secondary | ICD-10-CM | POA: Diagnosis not present

## 2022-03-29 DIAGNOSIS — I110 Hypertensive heart disease with heart failure: Secondary | ICD-10-CM | POA: Diagnosis not present

## 2022-03-29 DIAGNOSIS — I509 Heart failure, unspecified: Secondary | ICD-10-CM | POA: Diagnosis not present

## 2022-03-30 ENCOUNTER — Inpatient Hospital Stay: Payer: Medicare Other | Admitting: Nurse Practitioner

## 2022-03-30 ENCOUNTER — Telehealth: Payer: Self-pay

## 2022-03-30 DIAGNOSIS — I110 Hypertensive heart disease with heart failure: Secondary | ICD-10-CM | POA: Diagnosis not present

## 2022-03-30 DIAGNOSIS — I480 Paroxysmal atrial fibrillation: Secondary | ICD-10-CM | POA: Diagnosis not present

## 2022-03-30 DIAGNOSIS — C7951 Secondary malignant neoplasm of bone: Secondary | ICD-10-CM | POA: Diagnosis not present

## 2022-03-30 DIAGNOSIS — I509 Heart failure, unspecified: Secondary | ICD-10-CM | POA: Diagnosis not present

## 2022-03-30 DIAGNOSIS — C3491 Malignant neoplasm of unspecified part of right bronchus or lung: Secondary | ICD-10-CM | POA: Diagnosis not present

## 2022-03-30 DIAGNOSIS — I2510 Atherosclerotic heart disease of native coronary artery without angina pectoris: Secondary | ICD-10-CM | POA: Diagnosis not present

## 2022-03-30 NOTE — Telephone Encounter (Signed)
Called Brian Ramirez to check on him, because he miss his appointment. Patient stated he's current on hospice and he will not be coming in for anymore office visit.

## 2022-03-31 DIAGNOSIS — C7951 Secondary malignant neoplasm of bone: Secondary | ICD-10-CM | POA: Diagnosis not present

## 2022-03-31 DIAGNOSIS — I2510 Atherosclerotic heart disease of native coronary artery without angina pectoris: Secondary | ICD-10-CM | POA: Diagnosis not present

## 2022-03-31 DIAGNOSIS — I480 Paroxysmal atrial fibrillation: Secondary | ICD-10-CM | POA: Diagnosis not present

## 2022-03-31 DIAGNOSIS — C3491 Malignant neoplasm of unspecified part of right bronchus or lung: Secondary | ICD-10-CM | POA: Diagnosis not present

## 2022-03-31 DIAGNOSIS — I509 Heart failure, unspecified: Secondary | ICD-10-CM | POA: Diagnosis not present

## 2022-03-31 DIAGNOSIS — I110 Hypertensive heart disease with heart failure: Secondary | ICD-10-CM | POA: Diagnosis not present

## 2022-04-01 ENCOUNTER — Telehealth: Payer: Self-pay

## 2022-04-01 DIAGNOSIS — I2510 Atherosclerotic heart disease of native coronary artery without angina pectoris: Secondary | ICD-10-CM | POA: Diagnosis not present

## 2022-04-01 DIAGNOSIS — C3491 Malignant neoplasm of unspecified part of right bronchus or lung: Secondary | ICD-10-CM | POA: Diagnosis not present

## 2022-04-01 DIAGNOSIS — I110 Hypertensive heart disease with heart failure: Secondary | ICD-10-CM | POA: Diagnosis not present

## 2022-04-01 DIAGNOSIS — C7951 Secondary malignant neoplasm of bone: Secondary | ICD-10-CM | POA: Diagnosis not present

## 2022-04-01 DIAGNOSIS — I509 Heart failure, unspecified: Secondary | ICD-10-CM | POA: Diagnosis not present

## 2022-04-01 DIAGNOSIS — I480 Paroxysmal atrial fibrillation: Secondary | ICD-10-CM | POA: Diagnosis not present

## 2022-04-15 NOTE — Telephone Encounter (Signed)
[name] passed 04/12/2022 at 4:34 pm at the [facility].

## 2022-04-15 DEATH — deceased
